# Patient Record
Sex: Female | Born: 1970 | Race: Black or African American | Hispanic: No | Marital: Single | Smoking: Never smoker
Health system: Southern US, Community
[De-identification: ages and names within clinical notes are randomized; demographics above are authoritative.]

## PROBLEM LIST (undated history)

## (undated) DIAGNOSIS — G4730 Sleep apnea, unspecified: Secondary | ICD-10-CM

## (undated) DIAGNOSIS — E7800 Pure hypercholesterolemia, unspecified: Secondary | ICD-10-CM

## (undated) DIAGNOSIS — J189 Pneumonia, unspecified organism: Secondary | ICD-10-CM

## (undated) DIAGNOSIS — J45909 Unspecified asthma, uncomplicated: Secondary | ICD-10-CM

## (undated) DIAGNOSIS — M545 Low back pain, unspecified: Secondary | ICD-10-CM

## (undated) DIAGNOSIS — T7840XA Allergy, unspecified, initial encounter: Secondary | ICD-10-CM

## (undated) DIAGNOSIS — K829 Disease of gallbladder, unspecified: Secondary | ICD-10-CM

## (undated) DIAGNOSIS — R0602 Shortness of breath: Secondary | ICD-10-CM

## (undated) DIAGNOSIS — M25562 Pain in left knee: Secondary | ICD-10-CM

## (undated) DIAGNOSIS — I10 Essential (primary) hypertension: Secondary | ICD-10-CM

## (undated) DIAGNOSIS — M549 Dorsalgia, unspecified: Secondary | ICD-10-CM

## (undated) DIAGNOSIS — F439 Reaction to severe stress, unspecified: Secondary | ICD-10-CM

## (undated) DIAGNOSIS — F419 Anxiety disorder, unspecified: Secondary | ICD-10-CM

## (undated) DIAGNOSIS — D649 Anemia, unspecified: Secondary | ICD-10-CM

## (undated) DIAGNOSIS — G709 Myoneural disorder, unspecified: Secondary | ICD-10-CM

## (undated) DIAGNOSIS — E663 Overweight: Secondary | ICD-10-CM

## (undated) DIAGNOSIS — E119 Type 2 diabetes mellitus without complications: Secondary | ICD-10-CM

## (undated) DIAGNOSIS — M1990 Unspecified osteoarthritis, unspecified site: Secondary | ICD-10-CM

## (undated) DIAGNOSIS — Z91018 Allergy to other foods: Secondary | ICD-10-CM

## (undated) DIAGNOSIS — G8929 Other chronic pain: Secondary | ICD-10-CM

## (undated) DIAGNOSIS — R51 Headache: Secondary | ICD-10-CM

## (undated) DIAGNOSIS — G43909 Migraine, unspecified, not intractable, without status migrainosus: Secondary | ICD-10-CM

## (undated) DIAGNOSIS — N289 Disorder of kidney and ureter, unspecified: Secondary | ICD-10-CM

## (undated) HISTORY — DX: Pain in left knee: M25.562

## (undated) HISTORY — DX: Type 2 diabetes mellitus without complications: E11.9

## (undated) HISTORY — DX: Overweight: E66.3

## (undated) HISTORY — DX: Pure hypercholesterolemia, unspecified: E78.00

## (undated) HISTORY — DX: Allergy, unspecified, initial encounter: T78.40XA

## (undated) HISTORY — DX: Shortness of breath: R06.02

## (undated) HISTORY — PX: WISDOM TOOTH EXTRACTION: SHX21

## (undated) HISTORY — DX: Disease of gallbladder, unspecified: K82.9

## (undated) HISTORY — DX: Myoneural disorder, unspecified: G70.9

## (undated) HISTORY — DX: Dorsalgia, unspecified: M54.9

## (undated) HISTORY — DX: Unspecified asthma, uncomplicated: J45.909

## (undated) HISTORY — DX: Allergy to other foods: Z91.018

---
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
## 2004-09-25 DIAGNOSIS — J189 Pneumonia, unspecified organism: Secondary | ICD-10-CM

## 2004-09-25 HISTORY — DX: Pneumonia, unspecified organism: J18.9

## 2005-08-30 ENCOUNTER — Emergency Department (HOSPITAL_COMMUNITY): Admission: EM | Admit: 2005-08-30 | Discharge: 2005-08-30 | Payer: Self-pay | Admitting: Family Medicine

## 2005-12-11 ENCOUNTER — Ambulatory Visit: Payer: Self-pay | Admitting: Internal Medicine

## 2005-12-11 ENCOUNTER — Inpatient Hospital Stay (HOSPITAL_COMMUNITY): Admission: EM | Admit: 2005-12-11 | Discharge: 2005-12-15 | Payer: Self-pay | Admitting: Emergency Medicine

## 2005-12-22 ENCOUNTER — Ambulatory Visit: Payer: Self-pay | Admitting: Internal Medicine

## 2006-01-04 ENCOUNTER — Ambulatory Visit: Payer: Self-pay | Admitting: Internal Medicine

## 2006-01-09 ENCOUNTER — Ambulatory Visit: Payer: Self-pay | Admitting: Internal Medicine

## 2006-03-06 ENCOUNTER — Ambulatory Visit: Payer: Self-pay | Admitting: Internal Medicine

## 2006-03-06 ENCOUNTER — Encounter (INDEPENDENT_AMBULATORY_CARE_PROVIDER_SITE_OTHER): Payer: Self-pay | Admitting: *Deleted

## 2006-03-06 ENCOUNTER — Ambulatory Visit (HOSPITAL_COMMUNITY): Admission: RE | Admit: 2006-03-06 | Discharge: 2006-03-06 | Payer: Self-pay | Admitting: Internal Medicine

## 2006-05-04 ENCOUNTER — Ambulatory Visit: Payer: Self-pay | Admitting: Internal Medicine

## 2006-08-09 DIAGNOSIS — D509 Iron deficiency anemia, unspecified: Secondary | ICD-10-CM | POA: Insufficient documentation

## 2007-06-07 ENCOUNTER — Ambulatory Visit: Payer: Self-pay | Admitting: Internal Medicine

## 2007-06-07 ENCOUNTER — Encounter (INDEPENDENT_AMBULATORY_CARE_PROVIDER_SITE_OTHER): Payer: Self-pay | Admitting: *Deleted

## 2007-06-07 DIAGNOSIS — R110 Nausea: Secondary | ICD-10-CM

## 2007-06-07 LAB — CONVERTED CEMR LAB: Beta hcg, urine, semiquantitative: NEGATIVE

## 2007-06-10 LAB — CONVERTED CEMR LAB
Platelets: 470 10*3/uL — ABNORMAL HIGH (ref 150–400)
RDW: 16.1 % — ABNORMAL HIGH (ref 11.5–14.0)
WBC: 7.8 10*3/uL (ref 4.0–10.5)

## 2007-12-17 ENCOUNTER — Ambulatory Visit: Payer: Self-pay | Admitting: Family Medicine

## 2008-01-19 ENCOUNTER — Inpatient Hospital Stay (HOSPITAL_COMMUNITY): Admission: AD | Admit: 2008-01-19 | Discharge: 2008-01-20 | Payer: Self-pay | Admitting: Obstetrics and Gynecology

## 2008-03-12 ENCOUNTER — Ambulatory Visit (HOSPITAL_COMMUNITY): Admission: RE | Admit: 2008-03-12 | Discharge: 2008-03-12 | Payer: Self-pay | Admitting: Family Medicine

## 2008-04-01 ENCOUNTER — Ambulatory Visit (HOSPITAL_COMMUNITY): Admission: RE | Admit: 2008-04-01 | Discharge: 2008-04-01 | Payer: Self-pay | Admitting: Family Medicine

## 2008-04-16 ENCOUNTER — Ambulatory Visit (HOSPITAL_COMMUNITY): Admission: RE | Admit: 2008-04-16 | Discharge: 2008-04-16 | Payer: Self-pay | Admitting: Family Medicine

## 2008-05-28 ENCOUNTER — Ambulatory Visit (HOSPITAL_COMMUNITY): Admission: RE | Admit: 2008-05-28 | Discharge: 2008-05-28 | Payer: Self-pay | Admitting: Family Medicine

## 2008-06-21 ENCOUNTER — Inpatient Hospital Stay (HOSPITAL_COMMUNITY): Admission: AD | Admit: 2008-06-21 | Discharge: 2008-06-21 | Payer: Self-pay | Admitting: Obstetrics & Gynecology

## 2008-06-21 ENCOUNTER — Ambulatory Visit: Payer: Self-pay | Admitting: Family Medicine

## 2008-06-25 ENCOUNTER — Ambulatory Visit (HOSPITAL_COMMUNITY): Admission: RE | Admit: 2008-06-25 | Discharge: 2008-06-25 | Payer: Self-pay | Admitting: Family Medicine

## 2008-08-07 ENCOUNTER — Ambulatory Visit (HOSPITAL_COMMUNITY): Admission: RE | Admit: 2008-08-07 | Discharge: 2008-08-07 | Payer: Self-pay | Admitting: Family Medicine

## 2008-08-16 ENCOUNTER — Inpatient Hospital Stay (HOSPITAL_COMMUNITY): Admission: AD | Admit: 2008-08-16 | Discharge: 2008-08-16 | Payer: Self-pay | Admitting: Obstetrics & Gynecology

## 2008-08-26 ENCOUNTER — Ambulatory Visit: Payer: Self-pay | Admitting: Family

## 2008-08-26 ENCOUNTER — Inpatient Hospital Stay (HOSPITAL_COMMUNITY): Admission: AD | Admit: 2008-08-26 | Discharge: 2008-08-26 | Payer: Self-pay | Admitting: Obstetrics and Gynecology

## 2008-09-11 ENCOUNTER — Inpatient Hospital Stay (HOSPITAL_COMMUNITY): Admission: AD | Admit: 2008-09-11 | Discharge: 2008-09-11 | Payer: Self-pay | Admitting: Family Medicine

## 2008-09-12 ENCOUNTER — Inpatient Hospital Stay (HOSPITAL_COMMUNITY): Admission: AD | Admit: 2008-09-12 | Discharge: 2008-09-12 | Payer: Self-pay | Admitting: Obstetrics & Gynecology

## 2008-09-15 ENCOUNTER — Inpatient Hospital Stay (HOSPITAL_COMMUNITY): Admission: AD | Admit: 2008-09-15 | Discharge: 2008-09-15 | Payer: Self-pay | Admitting: Obstetrics & Gynecology

## 2008-09-15 ENCOUNTER — Ambulatory Visit: Payer: Self-pay | Admitting: Advanced Practice Midwife

## 2008-09-16 ENCOUNTER — Ambulatory Visit: Payer: Self-pay | Admitting: Obstetrics & Gynecology

## 2008-09-16 ENCOUNTER — Inpatient Hospital Stay (HOSPITAL_COMMUNITY): Admission: AD | Admit: 2008-09-16 | Discharge: 2008-09-21 | Payer: Self-pay | Admitting: Family Medicine

## 2008-09-18 ENCOUNTER — Encounter: Payer: Self-pay | Admitting: Family Medicine

## 2009-03-31 ENCOUNTER — Emergency Department (HOSPITAL_COMMUNITY): Admission: EM | Admit: 2009-03-31 | Discharge: 2009-03-31 | Payer: Self-pay | Admitting: Emergency Medicine

## 2009-06-28 ENCOUNTER — Ambulatory Visit: Payer: Self-pay | Admitting: Physician Assistant

## 2009-06-28 ENCOUNTER — Inpatient Hospital Stay (HOSPITAL_COMMUNITY): Admission: AD | Admit: 2009-06-28 | Discharge: 2009-06-28 | Payer: Self-pay | Admitting: Family Medicine

## 2009-07-01 ENCOUNTER — Encounter: Admission: RE | Admit: 2009-07-01 | Discharge: 2009-07-01 | Payer: Self-pay | Admitting: Family Medicine

## 2009-10-13 ENCOUNTER — Emergency Department (HOSPITAL_COMMUNITY): Admission: EM | Admit: 2009-10-13 | Discharge: 2009-10-13 | Payer: Self-pay | Admitting: Family Medicine

## 2010-10-16 ENCOUNTER — Encounter: Payer: Self-pay | Admitting: Family Medicine

## 2010-11-11 ENCOUNTER — Inpatient Hospital Stay (INDEPENDENT_AMBULATORY_CARE_PROVIDER_SITE_OTHER)
Admission: RE | Admit: 2010-11-11 | Discharge: 2010-11-11 | Disposition: A | Discharge status: Home / Self Care | Payer: Medicaid Other | Source: Ambulatory Visit | Attending: Family Medicine | Admitting: Family Medicine

## 2010-11-11 DIAGNOSIS — R198 Other specified symptoms and signs involving the digestive system and abdomen: Secondary | ICD-10-CM

## 2010-11-11 LAB — POCT URINALYSIS DIPSTICK
Bilirubin Urine: NEGATIVE
Ketones, ur: NEGATIVE mg/dL
Specific Gravity, Urine: 1.025 (ref 1.005–1.030)
Urine Glucose, Fasting: NEGATIVE mg/dL

## 2010-11-11 LAB — POCT PREGNANCY, URINE: Preg Test, Ur: NEGATIVE

## 2010-12-22 ENCOUNTER — Other Ambulatory Visit: Payer: Self-pay | Admitting: Gastroenterology

## 2010-12-22 DIAGNOSIS — R110 Nausea: Secondary | ICD-10-CM

## 2010-12-26 ENCOUNTER — Other Ambulatory Visit: Payer: Medicaid Other

## 2010-12-30 ENCOUNTER — Ambulatory Visit
Admission: RE | Admit: 2010-12-30 | Discharge: 2010-12-30 | Disposition: A | Discharge status: Home / Self Care | Payer: Medicaid Other | Source: Ambulatory Visit | Attending: Gastroenterology | Admitting: Gastroenterology

## 2010-12-30 DIAGNOSIS — R110 Nausea: Secondary | ICD-10-CM

## 2011-01-01 LAB — BASIC METABOLIC PANEL
BUN: 7 mg/dL (ref 6–23)
Chloride: 112 mEq/L (ref 96–112)
Creatinine, Ser: 0.79 mg/dL (ref 0.4–1.2)
GFR calc Af Amer: >60 mL/min (ref >60)
GFR calc non Af Amer: >60 mL/min (ref >60)
Sodium: 143 mEq/L (ref 135–145)

## 2011-01-01 LAB — DIFFERENTIAL
Lymphocytes Relative: 35 % (ref 12–46)
Lymphs Abs: 2.8 10*3/uL (ref 0.7–4.0)
Neutrophils Relative %: 54 % (ref 43–77)

## 2011-01-01 LAB — URINALYSIS, ROUTINE W REFLEX MICROSCOPIC
Bilirubin Urine: NEGATIVE
Ketones, ur: NEGATIVE mg/dL
Nitrite: NEGATIVE
Urobilinogen, UA: 0.2 mg/dL (ref 0.0–1.0)

## 2011-01-01 LAB — POCT CARDIAC MARKERS
CKMB, poc: <1 ng/mL — ABNORMAL LOW (ref 1.0–8.0)
Myoglobin, poc: 57.9 ng/mL (ref 12–200)
Troponin i, poc: <0.05 ng/mL (ref 0.00–0.09)

## 2011-01-01 LAB — CBC
HCT: 34.8 % — ABNORMAL LOW (ref 36.0–46.0)
Platelets: 459 10*3/uL — ABNORMAL HIGH (ref 150–400)
WBC: 8 10*3/uL (ref 4.0–10.5)

## 2011-01-01 LAB — POCT PREGNANCY, URINE: Preg Test, Ur: NEGATIVE

## 2011-02-07 NOTE — Op Note (Signed)
NAMEDONNETTA, GILLIN              ACCOUNT NO.:  [id number]   MEDICAL RECORD NO.:  [id number]         PATIENT TYPE:  WINP   LOCATION:  101                           FACILITY:  [facility]   PHYSICIAN:  Lazaro Arms, M.D.   DATE OF BIRTH:  12-16-1970   DATE OF PROCEDURE:  09/18/2008  DATE OF DISCHARGE:                               OPERATIVE REPORT   PREOPERATIVE DIAGNOSES:  1. Intrauterine pregnancy at 40-6/7 weeks' gestation.  2. Induction of labor secondary to variable decelerations with low      amniotic fluid index.  3. Significant arrest of dilatation soon.   POSTOPERATIVE DIAGNOSES:  1. Intrauterine pregnancy at 40-6/7 weeks' gestation.  2. Induction of labor secondary to variable decelerations with low      amniotic fluid index.  3. Significant arrest of dilatation soon.   PROCEDURE:  Primary low transverse cesarean section.   SURGEON:  Lazaro Arms, MD   ANESTHESIA:  Epidural.   FINDINGS:  The patient basically at 6-7 cm and did not get any further  despite adequate labor.  Fetal heart rate tracing was reassuring.  She  had had variable decelerations earlier, but baby looked great.  Overall,  no concerns as far as fetal heart rate status goes.  She had been 6 cm  for about 6 hours and had a gap that did not really was not coming to  pelvis well at all.  As a result, we proceeded with primary low  transverse cesarean section of a viable female, Apgars 9 and 9.  Weight  determined at the nursery, 3-vessel cord, pH 7.30.  Uterus, tubes and  ovaries were normal.   DESCRIPTION OF OPERATION:  The patient was taken to the operating room,  placed in the supine position with a left uterine tilt.  Epidural was  dosed to allow for adequate anesthesia.  She was prepped and draped in  the usual sterile fashion.  A Pfannenstiel skin incision was made and  carried down sharply to the rectus fascia, scored in the midline, and  extended laterally. The fascia was taken off the muscle  superiorly and  inferiorly without difficulty.  The muscles were divided and the  peritoneal cavity was entered.  A bladder blade was placed.  The  vesicouterine serosal flap was created.  Low transverse hysterotomy  incision was made.  Over this incision was delivered a viable female  infant with Apgars of 9 and 9, weight to be determined at the nursery.  Two-vessel cord, cord blood, and cord gas sent. Placenta was normal and  sent to pathology, pH of 7.30.  Uterus was closed in two layers, first  being running and locking layers and the second being imbricating layer.  There was good hemostasis.  Pelvis was irrigated vigorously.  Muscles  and peritoneum reapproximated loosely and fascia closed using 0 Vicryl  running.  Subcutaneous tissues were made hemostatic and irrigated.  The skin was  closed using skin staples.  The patient tolerated the procedure well.  She experienced 750 mL of blood loss and taken to the recovery room in  good and  stable condition.  All counts were correct x3.      Lazaro Arms, M.D.  Electronically Signed     LHE/MEDQ  D:  09/18/2008  T:  09/18/2008  Job:  [id number]

## 2011-02-07 NOTE — Discharge Summary (Signed)
[name] [name], [name] [name]              ACCOUNT NO.:  [id number]   MEDICAL RECORD NO.:  [id number]          PATIENT TYPE:  INP   LOCATION:  9101                          FACILITY:  [facility]   PHYSICIAN:  Lazaro Arms, M.D.   DATE OF BIRTH:  1971-03-08   DATE OF ADMISSION:  09/16/2008  DATE OF DISCHARGE:  09/21/2008                               DISCHARGE SUMMARY   REASON FOR ADMISSION:  Pregnancy at 40 weeks and 3 days who [facility] and noted to have decelerations on auscultation.  The  patient was then sent over to [facility] and it felt like prudent that the  patient be induced at that time.   DISCHARGE DIAGNOSES:  Pregnancy at term, failed induction of labor,  arrest of dilatation at 7 cm with a low transverse cesarean section by  Dr. Despina Hidden.   HOSPITAL COURSE:  Uneventful.  The patient was up ambulating well,  taking p.o. fluids and solids well, passing gas rectally, hemoglobin is  7.4, which the patient is tolerating that well.  She started out at 9.  She is asymptomatic when up and ambulating.  As far as contraception  goes, she is undecided.  She will discuss that at her 6 weeks' visit.   MEDICATIONS:  1. Motrin 600 one p.o. q.6. h. p.r.n., cramping.  2. Percocet 5/325 one p.o. q.4 h. p.r.n. pain.  3. HCTZ 25 mg p.o. daily if swelling.  4. Chromagen Forte b.i.d.   On assessment this morning, heart is regular rhythm and rate.  Lungs are  clear to auscultation bilaterally.  Abdomen is soft.  Bowel sounds are  present x4.  Incision is intact.  There is no redness, swelling, or  drainage.  Fundus is firm.  Lochia is scant amount.  She does have 1+  pitting edema in lower extremities.   ASSESSMENT:  Stable postop day 3, with anemia.  I am going to discharge  her home with the [facility] nurse to remove her staples on postop day 5  through 7.       Zerita Boers, N.M.      Lazaro Arms, M.D.  Electronically Signed   DL/MEDQ  D:  52/84/1324  T:  09/21/2008   Job:  [id number]   cc:   Lazaro Arms, M.D.  Fax: [phone]

## 2011-02-10 NOTE — Discharge Summary (Signed)
[name] [name], [name] [name]              ACCOUNT NO.:  [id number]   MEDICAL RECORD NO.:  [id number]          PATIENT TYPE:  INP   LOCATION:  2019                         FACILITY:  [facility]   PHYSICIAN:  Ileana Roup, M.D.  DATE OF BIRTH:  01/01/1971   DATE OF ADMISSION:  12/11/2005  DATE OF DISCHARGE:  12/15/2005                                 DISCHARGE SUMMARY   DISCHARGE DIAGNOSES:  1.  Sepsis second to community-acquired pneumonia.  2.  Respiratory alkalosis.  3.  Elevated liver enzymes.  4.  Anemia, most likely iron-deficiency secondary to menstruation.  5.  Rhabdomyolysis, mild.   DISCHARGE MEDICATIONS:  1.  Avelox 400 mg p.o. daily, till December 25, 2005.  2.  Multivitamin one pill daily.   FOLLOWUP:  The patient is to follow up with Dr. Liliane Channel on December 22, 2005 at  1:30 p.m. at this time to check for any resolution of her symptoms and also  to check a CBC count along with basic metabolic panel.  The CBC is to  determine if her hemoglobin is stable post hospitalization and basic  metabolic panel to see if her acute renal failure was resolving.  The  patient apparently needs a primary care physician and to make arrangements  for the same when she comes for followup visit.   PROCEDURE:  Done during this admission, none.   IMAGES:  Done during this admission:  1.  Chest x-ray done, on December 11, 2005, shows left upper lobe pneumonia.  2.  Ultrasound abdomen done, on December 11, 2005, shows normal complete      abdominal ultrasound study.  The mid abdominal aorta is not well seen      due to overlying gas.  3.  CT pelvis done with contrast was negative.  4.  Abdominal CT with contrast was negative.  5.  CT angio of chest shows no evidence of pulmonary embolism.  Near      complete left upper lobe consolidation and central right lower lobe      infiltrate consistent with pneumonia.  No evidence of mass or pleural      effusion.  6.  Chest x-ray done, on December 14, 2005, shows stable  confluent left upper      lobe infiltrate.  No significant interval change.   CONSULTANTS:  This admission, none.   BRIEF HISTORY AND PHYSICAL:  Please see medical records for details.  Ms.  [name] is a 40 year old, African American, morbidly obese woman with no  significant past medical history presenting to the ED with a four-day  history of cough with sputum production. The sputum was greenish-yellow,  moderate in quantity, not blood-tinged, associated with fevers (more than  103 degrees Fahrenheit), chills, and rigors.  The patient was apparently  normal prior to these four days.  The patient tried over-the-counter  medications like NyQuil and Tylenol but had no relief.  Associated with  chest pain center and right sided pleuritic in nature, increased __________  coughing on deep inspiration, no radiation, also associated with some amount  of right upper quadrant pain, shortness of breath.  No association with food  habits, no aggravating or alleviating factors.  The patient has a history of  sick contact, her mother and also says that since she works at [employer] she  could have been exposed to someone with infection as well.   ALLERGIES:  No known drug allergies.   PAST MEDICAL HISTORY:  Morbidly obese.   MEDICATIONS:  None.   SUBSTANCE HISTORY:  The patient has never smoked in her life,  does not  drink alcohol, and, has never used IV drugs or any other recreational drugs.   SOCIAL HISTORY:  She is single and her education status is to the 11th  grade.  She has insurance as provided by her employers, and she lives with  her Mom and Dad in [place].   FAMILY HISTORY:  Her mother is alive, has hypertension and osteoarthritis.  Father is alive and healthy.  She has four siblings who are healthy and no  children.   REVIEW OF SYSTEMS:  She had fever, chills, positive sick contact, night  sweats, fatigue, dyspnea, dyspnea on exertion, cough, sputum, nausea.  Her  last  menstrual period was November 20, 2005.   PHYSICAL EXAMINATION:  VITAL SIGNS:  Temperature 103.8, after giving Tylenol  it became 101.2, blood pressure 100/82 which later became 94/60, pulse of 75  which later became 125, respiratory rate of 28, O2 sat 95% on room air.  GENERAL APPEARANCE:  The patient appeared in mild respiratory distress.  She  could not speak in full sentences.  EYES:  Pupils equal, round, and reactive to light.  Extraocular movements  intact.  Oropharynx clear.  Pale conjunctivae.  NECK:  Obese, supple.  No adenopathy.  No thyromegaly.  LUNGS:  Tachypneic.  Air entry decreased in bilateral upper lobes.  Rhonchi  present in upper lobes bilaterally.  HEART:  Tachycardic.  Regular rhythm.  No murmurs, rubs, or gallops.  ABDOMEN:  Soft, obese.  Tenderness present in the periumbilical region and  right upper quadrant.  No rebound, guarding, or rigidity.  Bowel sounds  present.  EXTREMITIES:  Edema negative.  NEUROLOGIC:  Alert and oriented x3.  Grossly nonfocal.  PSYCHIATRIC:  Appropriate.   ADMISSION LABORATORY:  White cell count of 22.8, ANC of 19.6, hemoglobin of  10.6, hematocrit of 32.2, and platelets of 293, MCV of 73.  ABG:  PH of  7.58, carbon dioxide 21, O2 of 93, and bicarb of 20 on 99% saturation.  Sodium 126, potassium 2.7, chloride 91, bicarb 21, glucose 119, BUN 13,  creatinine 1.5.  Total bili 2.3, alk phos 108, AST 142, ALT 48, total  protein 7.5, albumin 2.4, and a calcium of 7.9.   HOSPITAL COURSE:  1.  Sepsis secondary to community-acquired pneumonia.  When the patient was      admitted, she had a temperature of 103.8 and a blood pressure of 100/82      and appeared to be in mild respiratory distress with good O2 sat of 94%      on room air.  However, after giving Tylenol, her temperature came down      to 101.2 but later as the time progressed she started becoming     hypotensive and tachycardic and she still had a fever.  At that time,      blood  cultures were drawn, sputum cultures were sent for Gram stain,      culture and sensitivity, urine cultures were also sent, and the patient  was placed on broad spectrum antibiotics given that we thought she was      getting septic secondary to the only cause that we had at that point in      time was the pneumonia in her lungs.  Given that the patient also had      some abdominal tenderness, we were concerned that she might have an      infection in her abdomen which could be also causing her to be septic;      therefore, a CT abdomen and pelvis was done to rule out any other      sources of infection.  They were found to be negative and therefore, her      final source of infection, we thought, was the pneumonia.  The patient's      blood pressure came up very well with just fluids and we did not have to      use any vasopressors.  Her temperature overnight defervesced and she did      not spike any temperatures after the first 24 hours of admission.  The      patient did have some amount of cough and sputum cultures were sent in      which all cultures to date have been negative.  Urine for Legionella      antigen and Strep Pneumococcus antigen was also sent which were      negative.  The patient had developed respiratory alkalosis secondary to      tachypnea.  After fluid administration and with some amount of      bronchodilator treatment and broad spectrum antibiotics of vancomycin      and Zosyn along with azithromycin, the patient's __________  improved.      The patient showed symptomatic improvement over the days of her      hospitalization and on the day of discharge, she was saturating very      well at 100% on room air., was able to ambulate by herself, did have      some residual cough but not as much as she had when she came in.  Repeat      chest x-ray was done which showed stable infiltrate, this needs followup      on an outpatient basis.  Repeat the chest x-ray in four  to six weeks' of      time to check for resolution of her pneumonia.  The patient was sent      home on Avelox 400 mg p.o. daily since she had completed four days of      antibiotics here in the hospital, she needs to take it for another 10      days to complete a total of 14-day course of treatment.  It was also      concerning that the patient was 34-years-of-age and had developed such a      bad pneumonia and was septic secondary to it.  Therefore, an HIV status      was checked which was nonreactive along with a hepatitis panel which was      also negative for hepatitis A, B, and C.  2.  Respiratory alkalosis secondary to problem #1.  This resolved with fluid      administration and therefore the patient was no longer tachypneic 24      hours after admission.  3.  Elevated liver enzymes.  This is most likely secondary to the sepsis.  Daily comprehensive metabolic panel was checked and it was seen that the      patient had a downward trend of her liver function tests, after she got      over the sepsis.  Of note, the patient's albumin is very low and on the      day of discharge was as low as 1.9.  We do not know why it is so low and      can only speculate at this point in time, to keep an eye on that in the      future.  4.  Rhabdomyolysis, mild.  The patient had elevated CK levels on admission      and she was treated with IV fluids and her creatinine kinase levels down      trended.  On the day of discharge, her cardiac CK was 5813.  5.  Anemia most likely secondary to menstrual losses.  The patient was      currently on her period when she was here in the hospital and her      hemoglobin stayed in the 8 to 9 grams per dL range.  There was an      initial drop after the patient came into the hospital and this was      thought to be secondary to hemodilution, given that the patient was      given a lot of IV fluids in order to maintain her blood pressure in the      first 24  hours of admission.  Iron studies were checked and it was seen      that serum iron was normal.  TIBC was elevated.  RBC folate was      elevated.  Haptoglobin elevated.  Vitamin B-12 within normal limits.   DISCHARGE LABORATORY:  Sodium of 141, potassium 3.4, chloride 110, bicarb  25, glucose 105, BUN 2, creatinine 0.7, calcium 7.8.  White cell count 10.7,  hemoglobin 8.6, hematocrit 26.4, MCV 73.5, platelet count 454.  CK level of  2,110.  This is on a downward trend.   Of note, the patient was given one dose of potassium chloride of 40 mEq STAT  before discharge.     Yetta Barre, M.D.    ______________________________  Ileana Roup, M.D.   SS/MEDQ  D:  12/15/2005  T:  12/15/2005  Job:  [id number]

## 2011-05-09 ENCOUNTER — Emergency Department (HOSPITAL_COMMUNITY): Payer: No Typology Code available for payment source

## 2011-05-09 ENCOUNTER — Emergency Department (HOSPITAL_COMMUNITY)
Admission: EM | Admit: 2011-05-09 | Discharge: 2011-05-09 | Disposition: A | Discharge status: Home / Self Care | Payer: No Typology Code available for payment source | Attending: Emergency Medicine | Admitting: Emergency Medicine

## 2011-05-09 DIAGNOSIS — R079 Chest pain, unspecified: Secondary | ICD-10-CM | POA: Insufficient documentation

## 2011-05-09 DIAGNOSIS — R51 Headache: Secondary | ICD-10-CM | POA: Insufficient documentation

## 2011-05-09 DIAGNOSIS — Z79899 Other long term (current) drug therapy: Secondary | ICD-10-CM | POA: Insufficient documentation

## 2011-05-09 DIAGNOSIS — S139XXA Sprain of joints and ligaments of unspecified parts of neck, initial encounter: Secondary | ICD-10-CM | POA: Insufficient documentation

## 2011-05-09 DIAGNOSIS — M542 Cervicalgia: Secondary | ICD-10-CM | POA: Insufficient documentation

## 2011-05-09 DIAGNOSIS — S060X0A Concussion without loss of consciousness, initial encounter: Secondary | ICD-10-CM | POA: Insufficient documentation

## 2011-05-11 ENCOUNTER — Emergency Department (HOSPITAL_COMMUNITY)
Admission: EM | Admit: 2011-05-11 | Discharge: 2011-05-11 | Disposition: A | Discharge status: Home / Self Care | Payer: No Typology Code available for payment source | Attending: Emergency Medicine | Admitting: Emergency Medicine

## 2011-05-11 DIAGNOSIS — S139XXA Sprain of joints and ligaments of unspecified parts of neck, initial encounter: Secondary | ICD-10-CM | POA: Insufficient documentation

## 2011-05-11 DIAGNOSIS — M546 Pain in thoracic spine: Secondary | ICD-10-CM | POA: Insufficient documentation

## 2011-05-11 DIAGNOSIS — Z79899 Other long term (current) drug therapy: Secondary | ICD-10-CM | POA: Insufficient documentation

## 2011-05-11 DIAGNOSIS — M25519 Pain in unspecified shoulder: Secondary | ICD-10-CM | POA: Insufficient documentation

## 2011-05-11 DIAGNOSIS — M542 Cervicalgia: Secondary | ICD-10-CM | POA: Insufficient documentation

## 2011-05-11 DIAGNOSIS — M545 Low back pain, unspecified: Secondary | ICD-10-CM | POA: Insufficient documentation

## 2011-06-20 LAB — URINALYSIS, ROUTINE W REFLEX MICROSCOPIC
Nitrite: NEGATIVE
Protein, ur: NEGATIVE
Specific Gravity, Urine: >1.03 — ABNORMAL HIGH
Urobilinogen, UA: 1

## 2011-06-20 LAB — CBC
HCT: 34.8 — ABNORMAL LOW
MCHC: 33.6
MCV: 77.7 — ABNORMAL LOW
RBC: 4.47

## 2011-06-20 LAB — URINE MICROSCOPIC-ADD ON

## 2011-06-20 LAB — WET PREP, GENITAL: Yeast Wet Prep HPF POC: NONE SEEN

## 2011-06-20 LAB — POCT PREGNANCY, URINE
Operator id: 120871
Preg Test, Ur: POSITIVE

## 2011-06-20 LAB — HCG, QUANTITATIVE, PREGNANCY: hCG, Beta Chain, Quant, S: 37320 — ABNORMAL HIGH

## 2011-06-27 LAB — URINALYSIS, ROUTINE W REFLEX MICROSCOPIC
Bilirubin Urine: NEGATIVE
Ketones, ur: NEGATIVE
Nitrite: NEGATIVE
Protein, ur: NEGATIVE
Urobilinogen, UA: 0.2
pH: 6.5

## 2011-06-30 LAB — CBC
HCT: 22.3 % — ABNORMAL LOW (ref 36.0–46.0)
HCT: 22.9 % — ABNORMAL LOW (ref 36.0–46.0)
HCT: 31.5 % — ABNORMAL LOW (ref 36.0–46.0)
Hemoglobin: 10.1 g/dL — ABNORMAL LOW (ref 12.0–15.0)
MCHC: 31.9 g/dL (ref 30.0–36.0)
MCHC: 32.1 g/dL (ref 30.0–36.0)
MCV: 74.3 fL — ABNORMAL LOW (ref 78.0–100.0)
MCV: 75.3 fL — ABNORMAL LOW (ref 78.0–100.0)
Platelets: 282 10*3/uL (ref 150–400)
Platelets: 353 10*3/uL (ref 150–400)
RBC: 4.24 MIL/uL (ref 3.87–5.11)
RDW: 20.8 % — ABNORMAL HIGH (ref 11.5–15.5)
WBC: 13.3 10*3/uL — ABNORMAL HIGH (ref 4.0–10.5)
WBC: 8.5 10*3/uL (ref 4.0–10.5)

## 2011-07-15 ENCOUNTER — Emergency Department (HOSPITAL_COMMUNITY)
Admission: EM | Admit: 2011-07-15 | Discharge: 2011-07-15 | Disposition: A | Discharge status: Home / Self Care | Payer: No Typology Code available for payment source | Attending: Emergency Medicine | Admitting: Emergency Medicine

## 2011-07-15 DIAGNOSIS — M533 Sacrococcygeal disorders, not elsewhere classified: Secondary | ICD-10-CM | POA: Insufficient documentation

## 2011-07-15 DIAGNOSIS — M545 Low back pain, unspecified: Secondary | ICD-10-CM | POA: Insufficient documentation

## 2011-07-15 DIAGNOSIS — M25559 Pain in unspecified hip: Secondary | ICD-10-CM | POA: Insufficient documentation

## 2011-07-15 DIAGNOSIS — M5430 Sciatica, unspecified side: Secondary | ICD-10-CM | POA: Insufficient documentation

## 2011-07-15 DIAGNOSIS — M79609 Pain in unspecified limb: Secondary | ICD-10-CM | POA: Insufficient documentation

## 2011-07-15 DIAGNOSIS — F411 Generalized anxiety disorder: Secondary | ICD-10-CM | POA: Insufficient documentation

## 2011-07-15 DIAGNOSIS — E669 Obesity, unspecified: Secondary | ICD-10-CM | POA: Insufficient documentation

## 2012-02-29 ENCOUNTER — Encounter (HOSPITAL_COMMUNITY): Payer: Self-pay | Admitting: Emergency Medicine

## 2012-02-29 ENCOUNTER — Emergency Department (HOSPITAL_COMMUNITY): Payer: Medicaid Other

## 2012-02-29 ENCOUNTER — Inpatient Hospital Stay (HOSPITAL_COMMUNITY)
Admission: EM | Admit: 2012-02-29 | Discharge: 2012-03-03 | DRG: 418 | Disposition: A | Discharge status: Home / Self Care | Payer: Medicaid Other | Source: Ambulatory Visit | Attending: General Surgery | Admitting: General Surgery

## 2012-02-29 DIAGNOSIS — K8020 Calculus of gallbladder without cholecystitis without obstruction: Secondary | ICD-10-CM

## 2012-02-29 DIAGNOSIS — D649 Anemia, unspecified: Secondary | ICD-10-CM | POA: Insufficient documentation

## 2012-02-29 DIAGNOSIS — R51 Headache: Secondary | ICD-10-CM

## 2012-02-29 DIAGNOSIS — K8010 Calculus of gallbladder with chronic cholecystitis without obstruction: Secondary | ICD-10-CM

## 2012-02-29 DIAGNOSIS — Z6841 Body Mass Index (BMI) 40.0 and over, adult: Secondary | ICD-10-CM

## 2012-02-29 DIAGNOSIS — R110 Nausea: Secondary | ICD-10-CM | POA: Diagnosis present

## 2012-02-29 DIAGNOSIS — K824 Cholesterolosis of gallbladder: Secondary | ICD-10-CM

## 2012-02-29 HISTORY — DX: Headache: R51

## 2012-02-29 HISTORY — DX: Anemia, unspecified: D64.9

## 2012-02-29 HISTORY — DX: Unspecified osteoarthritis, unspecified site: M19.90

## 2012-02-29 HISTORY — DX: Low back pain, unspecified: M54.50

## 2012-02-29 HISTORY — DX: Essential (primary) hypertension: I10

## 2012-02-29 HISTORY — DX: Migraine, unspecified, not intractable, without status migrainosus: G43.909

## 2012-02-29 HISTORY — DX: Low back pain: M54.5

## 2012-02-29 HISTORY — DX: Pneumonia, unspecified organism: J18.9

## 2012-02-29 HISTORY — DX: Reaction to severe stress, unspecified: F43.9

## 2012-02-29 HISTORY — DX: Other chronic pain: G89.29

## 2012-02-29 HISTORY — DX: Anxiety disorder, unspecified: F41.9

## 2012-02-29 LAB — CBC
Hemoglobin: 11.3 g/dL — ABNORMAL LOW (ref 12.0–15.0)
MCHC: 31.7 g/dL (ref 30.0–36.0)
Platelets: 427 10*3/uL — ABNORMAL HIGH (ref 150–400)
RBC: 4.62 MIL/uL (ref 3.87–5.11)

## 2012-02-29 LAB — URINALYSIS, ROUTINE W REFLEX MICROSCOPIC
Bilirubin Urine: NEGATIVE
Glucose, UA: NEGATIVE mg/dL
Hgb urine dipstick: NEGATIVE
Ketones, ur: NEGATIVE mg/dL
Protein, ur: NEGATIVE mg/dL
pH: 6 (ref 5.0–8.0)

## 2012-02-29 LAB — DIFFERENTIAL
Basophils Relative: 0 % (ref 0–1)
Lymphs Abs: 2 10*3/uL (ref 0.7–4.0)
Monocytes Relative: 5 % (ref 3–12)
Neutro Abs: 5.1 10*3/uL (ref 1.7–7.7)
Neutrophils Relative %: 63 % (ref 43–77)

## 2012-02-29 LAB — COMPREHENSIVE METABOLIC PANEL
ALT: 10 U/L (ref 0–35)
AST: 14 U/L (ref 0–37)
Albumin: 3.3 g/dL — ABNORMAL LOW (ref 3.5–5.2)
Alkaline Phosphatase: 79 U/L (ref 39–117)
BUN: 8 mg/dL (ref 6–23)
Chloride: 106 mEq/L (ref 96–112)
Potassium: 4.1 mEq/L (ref 3.5–5.1)
Sodium: 139 mEq/L (ref 135–145)
Total Bilirubin: 0.3 mg/dL (ref 0.3–1.2)

## 2012-02-29 MED ORDER — HYDROMORPHONE HCL PF 1 MG/ML IJ SOLN
1.0000 mg | Freq: Once | INTRAMUSCULAR | Status: AC
  Administered 2012-02-29: 1 mg via INTRAVENOUS
  Filled 2012-02-29: qty 1

## 2012-02-29 MED ORDER — ONDANSETRON HCL 4 MG/2ML IJ SOLN
4.0000 mg | Freq: Once | INTRAMUSCULAR | Status: AC
  Administered 2012-02-29: 4 mg via INTRAVENOUS
  Filled 2012-02-29: qty 2

## 2012-02-29 MED ORDER — CEFAZOLIN SODIUM-DEXTROSE 2-3 GM-% IV SOLR
2.0000 g | INTRAVENOUS | Status: DC
  Filled 2012-02-29: qty 50

## 2012-02-29 MED ORDER — MORPHINE SULFATE 2 MG/ML IJ SOLN
1.0000 mg | INTRAMUSCULAR | Status: DC | PRN
  Administered 2012-02-29 – 2012-03-02 (×4): 2 mg via INTRAVENOUS
  Filled 2012-02-29 (×4): qty 1

## 2012-02-29 MED ORDER — POTASSIUM CHLORIDE IN NACL 20-0.9 MEQ/L-% IV SOLN
INTRAVENOUS | Status: DC
  Administered 2012-02-29 – 2012-03-02 (×4): via INTRAVENOUS
  Filled 2012-02-29 (×9): qty 1000

## 2012-02-29 MED ORDER — SODIUM CHLORIDE 0.9 % IV SOLN
1000.0000 mL | INTRAVENOUS | Status: DC
  Administered 2012-02-29: 1000 mL via INTRAVENOUS

## 2012-02-29 MED ORDER — PANTOPRAZOLE SODIUM 40 MG IV SOLR
40.0000 mg | Freq: Every day | INTRAVENOUS | Status: DC
  Administered 2012-02-29 – 2012-03-02 (×3): 40 mg via INTRAVENOUS
  Filled 2012-02-29 (×4): qty 40

## 2012-02-29 MED ORDER — CEFAZOLIN SODIUM-DEXTROSE 2-3 GM-% IV SOLR
2.0000 g | Freq: Three times a day (TID) | INTRAVENOUS | Status: DC
  Administered 2012-02-29 – 2012-03-02 (×5): 2 g via INTRAVENOUS
  Filled 2012-02-29 (×7): qty 50

## 2012-02-29 MED ORDER — CHLORHEXIDINE GLUCONATE 4 % EX LIQD
1.0000 "application " | Freq: Once | CUTANEOUS | Status: AC
  Administered 2012-03-01: 1 via TOPICAL
  Filled 2012-02-29: qty 15

## 2012-02-29 MED ORDER — SODIUM CHLORIDE 0.9 % IV SOLN
1000.0000 mL | Freq: Once | INTRAVENOUS | Status: AC
Start: 2012-02-29 — End: 2012-02-29
  Administered 2012-02-29: 1000 mL via INTRAVENOUS

## 2012-02-29 MED ORDER — DIPHENHYDRAMINE HCL 12.5 MG/5ML PO ELIX
12.5000 mg | ORAL_SOLUTION | Freq: Four times a day (QID) | ORAL | Status: DC | PRN
  Filled 2012-02-29: qty 10

## 2012-02-29 MED ORDER — DIPHENHYDRAMINE HCL 50 MG/ML IJ SOLN
12.5000 mg | Freq: Four times a day (QID) | INTRAMUSCULAR | Status: DC | PRN
  Administered 2012-02-29: 25 mg via INTRAVENOUS
  Filled 2012-02-29: qty 1

## 2012-02-29 MED ORDER — ONDANSETRON HCL 4 MG/2ML IJ SOLN
4.0000 mg | Freq: Four times a day (QID) | INTRAMUSCULAR | Status: DC | PRN
  Administered 2012-02-29 – 2012-03-02 (×3): 4 mg via INTRAVENOUS
  Filled 2012-02-29 (×3): qty 2

## 2012-02-29 NOTE — ED Notes (Signed)
Pt c/o RUQ pain starting this am; pt sts nausea but denies vomiting; pt sts hx of same that was thought to be gall stones but pt did not follow up

## 2012-02-29 NOTE — ED Notes (Signed)
Attempted to call report to floor; floor unable to accept to call back 

## 2012-02-29 NOTE — H&P (Addendum)
[name] [name] Aug 23, 1971  [id number].   Requesting MD: Dr. Lynelle Doctor Chief Complaint/Reason for Consult: Abd pain, cholithiasis  HPI: Patient is a 41 yr old female who presented to the [facility] with almost 24 hour h/o abdominal pain with nausea.  The pain has been located in the right upper quadrant.  She reports this began about 2 hours after eating last night and progressively got worse.  She also relates right flank/back pain.  She has a history of this about 6 months ago with similar symptoms but they were not as severe and she was told at that time that she likely had gallstones.  She was given Dexilant at that time.  She denies diarrhea or vomiting.  She denies fevers or chills.  She has felt weak.  Review of Systems: All other negative except as indicated in HPI  History reviewed. No pertinent family history.  Past Medical History  Diagnosis Date  . Anemia     History reviewed. No pertinent past surgical history.  Social History:  reports that she has never smoked. She does not have any smokeless tobacco history on file. She reports that she does not drink alcohol or use illicit drugs.  Allergies:  Allergies  Allergen Reactions  . Mushroom Extract Complex      (Not in a hospital admission)  Blood pressure 119/71, pulse 91, temperature 98.3 F (36.8 C), temperature source Oral, resp. rate 18, SpO2 98.00%. Physical Exam: General:  WDWN in NAD.  Pleasant and cooperative.  Mild distress  HEENT:  NCAT, EOMI, no icterus.  NECK:  Supple  CV:  RRR, no murmur, no JVD.  CHEST:  No scars.  RESPIRATORY:  Breath sounds equal and clear. Respirations nonlabored.  ABDOMEN:  Soft, tender in the right upper quadrant, nondistended, no masses, no organomegaly, active bowel sounds, no scars, no hernias.  GU:  No bulges.  MUSCULOSKELETAL:  FROM, good muscle tone, no edema, no venous stasis changes  SKIN:  Warm and dry, No jaundice or suspicious rashes.  NEUROLOGIC:  Alert and  oriented, answers questions appropriately, moves all four ext equally  Results for orders placed during the hospital encounter of 02/29/12 (from the past 48 hour(s))  CBC     Status: Abnormal   Collection Time   02/29/12 10:23 AM      Component Value Range Comment   WBC 8.1  4.0 - 10.5 (K/uL)    RBC 4.62  3.87 - 5.11 (MIL/uL)    Hemoglobin 11.3 (*) 12.0 - 15.0 (g/dL)    HCT 82.9 (*) 56.2 - 46.0 (%)    MCV 77.3 (*) 78.0 - 100.0 (fL)    MCH 24.5 (*) 26.0 - 34.0 (pg)    MCHC 31.7  30.0 - 36.0 (g/dL)    RDW 13.0 (*) 86.5 - 15.5 (%)    Platelets 427 (*) 150 - 400 (K/uL)   DIFFERENTIAL     Status: Abnormal   Collection Time   02/29/12 10:23 AM      Component Value Range Comment   Neutrophils Relative 63  43 - 77 (%)    Neutro Abs 5.1  1.7 - 7.7 (K/uL)    Lymphocytes Relative 25  12 - 46 (%)    Lymphs Abs 2.0  0.7 - 4.0 (K/uL)    Monocytes Relative 5  3 - 12 (%)    Monocytes Absolute 0.4  0.1 - 1.0 (K/uL)    Eosinophils Relative 7 (*) 0 - 5 (%)    Eosinophils Absolute 0.6  0.0 - 0.7 (K/uL)    Basophils Relative 0  0 - 1 (%)    Basophils Absolute 0.0  0.0 - 0.1 (K/uL)   COMPREHENSIVE METABOLIC PANEL     Status: Abnormal   Collection Time   02/29/12 10:23 AM      Component Value Range Comment   Sodium 139  135 - 145 (mEq/L)    Potassium 4.1  3.5 - 5.1 (mEq/L)    Chloride 106  96 - 112 (mEq/L)    CO2 25  19 - 32 (mEq/L)    Glucose, Bld 109 (*) 70 - 99 (mg/dL)    BUN 8  6 - 23 (mg/dL)    Creatinine, Ser 1.47  0.50 - 1.10 (mg/dL)    Calcium 9.3  8.4 - 10.5 (mg/dL)    Total Protein 7.6  6.0 - 8.3 (g/dL)    Albumin 3.3 (*) 3.5 - 5.2 (g/dL)    AST 14  0 - 37 (U/L)    ALT 10  0 - 35 (U/L)    Alkaline Phosphatase 79  39 - 117 (U/L)    Total Bilirubin 0.3  0.3 - 1.2 (mg/dL)    GFR calc non Af Amer >90  >90 (mL/min)    GFR calc Af Amer >90  >90 (mL/min)   LIPASE, BLOOD     Status: Normal   Collection Time   02/29/12 10:23 AM      Component Value Range Comment   Lipase 22  11 - 59 (U/L)     URINALYSIS, ROUTINE W REFLEX MICROSCOPIC     Status: Normal   Collection Time   02/29/12 11:49 AM      Component Value Range Comment   Color, Urine YELLOW  YELLOW     APPearance CLEAR  CLEAR     Specific Gravity, Urine 1.007  1.005 - 1.030     pH 6.0  5.0 - 8.0     Glucose, UA NEGATIVE  NEGATIVE (mg/dL)    Hgb urine dipstick NEGATIVE  NEGATIVE     Bilirubin Urine NEGATIVE  NEGATIVE     Ketones, ur NEGATIVE  NEGATIVE (mg/dL)    Protein, ur NEGATIVE  NEGATIVE (mg/dL)    Urobilinogen, UA 0.2  0.0 - 1.0 (mg/dL)    Nitrite NEGATIVE  NEGATIVE     Leukocytes, UA NEGATIVE  NEGATIVE  MICROSCOPIC NOT DONE ON URINES WITH NEGATIVE PROTEIN, BLOOD, LEUKOCYTES, NITRITE, OR GLUCOSE <1000 mg/dL.  PREGNANCY, URINE     Status: Normal   Collection Time   02/29/12 11:49 AM      Component Value Range Comment   Preg Test, Ur NEGATIVE  NEGATIVE     US Abdomen Complete  02/29/2012  *RADIOLOGY REPORT*  Clinical Data:  Right upper quadrant abdominal pain.  COMPLETE ABDOMINAL ULTRASOUND  Comparison:  12/30/2010  Findings:  Gallbladder:  Gallstone 1.5 cm.  No wall thickening or pericholecystic fluid.  Sonographic Murphy's sign was not elicited; the patient was premedicated prior to the exam.  Common bile duct: Normal, 5 mm.  Liver: Increasing echogenicity.  IVC: Negative  Pancreas:  Negative.  Partially obscured pancreatic tail.  Spleen:  Normal in size and echogenicity.  Right Kidney:  11.3 cm. No hydronephrosis.  Left Kidney:  12.0 cm. No hydronephrosis.  Abdominal aorta:  Nonaneurysmal without ascites.  IMPRESSION:  1.  Cholelithiasis.  No specific evidence of acute cholecystitis. 2.  Hepatic steatosis.  Original Report Authenticated By: Consuello Bossier, M.D.       Assessment/Plan  1.  Abd pain with cholithiasis: will admit patient to med-surg, NPO, IVFs, start on IV abx, iv pain meds and prn zofran.  We will plan for laparoscopic cholecystectomy tomorrow given her persistent and recurring symptoms.  RBA were  discussed with the patient including bleeding, infection, bowel injury, and conversion to open case.  Patient expresses understanding.  Dr. Derrell Lolling has seen and evaluated the patient as well and agrees with the above A and P.    WHITE, ELIZABETH 02/29/2012, 4:11 PM     Surgery attending note:  I have personally interviewed and examined this patient. I reviewed the lab work and x-ray. I agree with evaluation and treatment plan as outlined by Kerman Passey., PA.  The patient will be admitted to the hospital slight external antibiotics, analgesics, anti-emetics, and deflated we would rehydration. If the schedule will allow, she will be taken to the operating room tomorrow for laparoscopic cholecystectomy with cholangiogram, possible open. I discussed the indications and details of the surgery with her. I discussed techniques of surgery and the numerous risks involved in this procedure. All of her questions are answered. She seems to understand these issues well. She agrees with this plan.   Angelia Mould. Derrell Lolling, M.D., [facility] General and Minimally invasive Surgery Breast and Colorectal Surgery Office:   [phone] Pager:   [phone]

## 2012-02-29 NOTE — ED Notes (Signed)
Pt. Is unable to void at this time.  

## 2012-02-29 NOTE — ED Provider Notes (Signed)
History     CSN: [id number]  Arrival date & time 02/29/12  4098   First MD Initiated Contact with Patient 02/29/12 1001      Chief Complaint  Patient presents with  . Abdominal Pain  . Nausea    (Consider location/radiation/quality/duration/timing/severity/associated sxs/prior treatment) Patient is a 41 y.o. female presenting with abdominal pain. The history is provided by the patient.  Abdominal Pain The primary symptoms of the illness include abdominal pain. Episode onset: off and on for the last week but got severe this am about 1am. The onset of the illness was sudden. The problem has been gradually worsening.  The abdominal pain is located in the RUQ. The abdominal pain radiates to the back. The severity of the abdominal pain is 8/10. The abdominal pain is relieved by nothing.  The patient states that she believes she is currently not pregnant. Additional symptoms associated with the illness include anorexia and back pain. Symptoms associated with the illness do not include diaphoresis, constipation or hematuria.  Pt had a roast and cabbage last night.   No particular relation to certain foods.  No blood in her stool.  No vaginal bleeding or discharge  Past Medical History  Diagnosis Date  . Anemia     History reviewed. No pertinent past surgical history. C section  History reviewed. No pertinent family history.  History  Substance Use Topics  . Smoking status: Never Smoker   . Smokeless tobacco: Not on file  . Alcohol Use: No    OB History    Grav Para Term Preterm Abortions TAB SAB Ect Mult Living                  Review of Systems  Constitutional: Negative for diaphoresis.  Gastrointestinal: Positive for abdominal pain and anorexia. Negative for constipation.  Genitourinary: Negative for hematuria.  Musculoskeletal: Positive for back pain.  All other systems reviewed and are negative.    Allergies  Mushroom extract complex  Home Medications  No current  outpatient prescriptions on file.  BP 135/80  Pulse 89  Temp 97.6 F (36.4 C)  Resp 18  SpO2 99%  Physical Exam  Nursing note and vitals reviewed. Constitutional: She appears well-developed and well-nourished. She appears distressed.       Obese, uncomfortable  HENT:  Head: Normocephalic and atraumatic.  Right Ear: External ear normal.  Left Ear: External ear normal.  Eyes: Conjunctivae are normal. Right eye exhibits no discharge. Left eye exhibits no discharge. No scleral icterus.  Neck: Neck supple. No tracheal deviation present.  Cardiovascular: Normal rate, regular rhythm and intact distal pulses.   Pulmonary/Chest: Effort normal and breath sounds normal. No stridor. No respiratory distress. She has no wheezes. She has no rales.  Abdominal: Soft. Bowel sounds are normal. She exhibits no distension, no ascites and no mass. There is tenderness in the right upper quadrant. There is guarding. There is no rigidity and no rebound.  Musculoskeletal: She exhibits no edema and no tenderness.  Neurological: She is alert. She has normal strength. No sensory deficit. Cranial nerve deficit:  no gross defecits noted. She exhibits normal muscle tone. She displays no seizure activity. Coordination normal.  Skin: Skin is warm and dry. No rash noted.  Psychiatric: She has a normal mood and affect.    ED Course  Procedures (including critical care time)  Labs Reviewed  CBC - Abnormal; Notable for the following:    Hemoglobin 11.3 (*)    HCT 35.7 (*)  MCV 77.3 (*)    MCH 24.5 (*)    RDW 16.1 (*)    Platelets 427 (*)    All other components within normal limits  DIFFERENTIAL - Abnormal; Notable for the following:    Eosinophils Relative 7 (*)    All other components within normal limits  COMPREHENSIVE METABOLIC PANEL - Abnormal; Notable for the following:    Glucose, Bld 109 (*)    Albumin 3.3 (*)    All other components within normal limits  URINALYSIS, ROUTINE W REFLEX MICROSCOPIC    LIPASE, BLOOD  PREGNANCY, URINE   US Abdomen Complete  02/29/2012  *RADIOLOGY REPORT*  Clinical Data:  Right upper quadrant abdominal pain.  COMPLETE ABDOMINAL ULTRASOUND  Comparison:  12/30/2010  Findings:  Gallbladder:  Gallstone 1.5 cm.  No wall thickening or pericholecystic fluid.  Sonographic Murphy's sign was not elicited; the patient was premedicated prior to the exam.  Common bile duct: Normal, 5 mm.  Liver: Increasing echogenicity.  IVC: Negative  Pancreas:  Negative.  Partially obscured pancreatic tail.  Spleen:  Normal in size and echogenicity.  Right Kidney:  11.3 cm. No hydronephrosis.  Left Kidney:  12.0 cm. No hydronephrosis.  Abdominal aorta:  Nonaneurysmal without ascites.  IMPRESSION:  1.  Cholelithiasis.  No specific evidence of acute cholecystitis. 2.  Hepatic steatosis.  Original Report Authenticated By: Consuello Bossier, M.D.     1. Cholelithiasis       MDM  Patient's symptoms were suggestive of biliary colic. She is improved with pain medications however she still is having tenderness on exam. Considering the chronicity of her pain associated with her cholelithiasis I will consult general surgery service to discuss cholecystectomy        Celene Kras, MD 02/29/12 1521

## 2012-03-01 LAB — SURGICAL PCR SCREEN: MRSA, PCR: NEGATIVE

## 2012-03-01 MED ORDER — ACETAMINOPHEN 325 MG PO TABS
650.0000 mg | ORAL_TABLET | Freq: Once | ORAL | Status: AC
  Administered 2012-03-01: 650 mg via ORAL
  Filled 2012-03-01: qty 2

## 2012-03-01 NOTE — Progress Notes (Signed)
Subjective: Had a relatively quiet night. No nausea. Still has a little bit of pain. Using pain medicine. Afebrile. Normal vital signs.  Objective: Vital signs in last 24 hours: Temp:  [97.6 F (36.4 C)-98.4 F (36.9 C)] 98.2 F (36.8 C) (06/06 2211) Pulse Rate:  [74-91] 74  (06/06 2211) Resp:  [16-18] 16  (06/06 2211) BP: (112-135)/(57-80) 119/57 mmHg (06/06 2211) SpO2:  [98 %-100 %] 98 % (06/06 2211) Last BM Date: 02/28/12  Intake/Output from previous day: 06/06 0701 - 06/07 0700 In: 325 [I.V.:325] Out: 500 [Urine:500] Intake/Output this shift: Total I/O In: 325 [I.V.:325] Out: 500 [Urine:500]  Physical Exam: General appearance: alert. Stable.. No status normal. No distress. GI: mild right upper quadrant tenderness. No mass. No distention. Obese.  Lab Results:  Results for orders placed during the hospital encounter of 02/29/12 (from the past 24 hour(s))  CBC     Status: Abnormal   Collection Time   02/29/12 10:23 AM      Component Value Range   WBC 8.1  4.0 - 10.5 (K/uL)   RBC 4.62  3.87 - 5.11 (MIL/uL)   Hemoglobin 11.3 (*) 12.0 - 15.0 (g/dL)   HCT 16.1 (*) 09.6 - 46.0 (%)   MCV 77.3 (*) 78.0 - 100.0 (fL)   MCH 24.5 (*) 26.0 - 34.0 (pg)   MCHC 31.7  30.0 - 36.0 (g/dL)   RDW 04.5 (*) 40.9 - 15.5 (%)   Platelets 427 (*) 150 - 400 (K/uL)  DIFFERENTIAL     Status: Abnormal   Collection Time   02/29/12 10:23 AM      Component Value Range   Neutrophils Relative 63  43 - 77 (%)   Neutro Abs 5.1  1.7 - 7.7 (K/uL)   Lymphocytes Relative 25  12 - 46 (%)   Lymphs Abs 2.0  0.7 - 4.0 (K/uL)   Monocytes Relative 5  3 - 12 (%)   Monocytes Absolute 0.4  0.1 - 1.0 (K/uL)   Eosinophils Relative 7 (*) 0 - 5 (%)   Eosinophils Absolute 0.6  0.0 - 0.7 (K/uL)   Basophils Relative 0  0 - 1 (%)   Basophils Absolute 0.0  0.0 - 0.1 (K/uL)  COMPREHENSIVE METABOLIC PANEL     Status: Abnormal   Collection Time   02/29/12 10:23 AM      Component Value Range   Sodium 139  135 - 145  (mEq/L)   Potassium 4.1  3.5 - 5.1 (mEq/L)   Chloride 106  96 - 112 (mEq/L)   CO2 25  19 - 32 (mEq/L)   Glucose, Bld 109 (*) 70 - 99 (mg/dL)   BUN 8  6 - 23 (mg/dL)   Creatinine, Ser 8.11  0.50 - 1.10 (mg/dL)   Calcium 9.3  8.4 - 91.4 (mg/dL)   Total Protein 7.6  6.0 - 8.3 (g/dL)   Albumin 3.3 (*) 3.5 - 5.2 (g/dL)   AST 14  0 - 37 (U/L)   ALT 10  0 - 35 (U/L)   Alkaline Phosphatase 79  39 - 117 (U/L)   Total Bilirubin 0.3  0.3 - 1.2 (mg/dL)   GFR calc non Af Amer >90  >90 (mL/min)   GFR calc Af Amer >90  >90 (mL/min)  LIPASE, BLOOD     Status: Normal   Collection Time   02/29/12 10:23 AM      Component Value Range   Lipase 22  11 - 59 (U/L)  URINALYSIS, ROUTINE W REFLEX MICROSCOPIC  Status: Normal   Collection Time   02/29/12 11:49 AM      Component Value Range   Color, Urine YELLOW  YELLOW    APPearance CLEAR  CLEAR    Specific Gravity, Urine 1.007  1.005 - 1.030    pH 6.0  5.0 - 8.0    Glucose, UA NEGATIVE  NEGATIVE (mg/dL)   Hgb urine dipstick NEGATIVE  NEGATIVE    Bilirubin Urine NEGATIVE  NEGATIVE    Ketones, ur NEGATIVE  NEGATIVE (mg/dL)   Protein, ur NEGATIVE  NEGATIVE (mg/dL)   Urobilinogen, UA 0.2  0.0 - 1.0 (mg/dL)   Nitrite NEGATIVE  NEGATIVE    Leukocytes, UA NEGATIVE  NEGATIVE   PREGNANCY, URINE     Status: Normal   Collection Time   02/29/12 11:49 AM      Component Value Range   Preg Test, Ur NEGATIVE  NEGATIVE   SURGICAL PCR SCREEN     Status: Normal   Collection Time   03/01/12  3:57 AM      Component Value Range   MRSA, PCR NEGATIVE  NEGATIVE    Staphylococcus aureus NEGATIVE  NEGATIVE      Studies/Results: @RISRSLT24 @     . sodium chloride  1,000 mL Intravenous Once  .  ceFAZolin (ANCEF) IV  2 g Intravenous Q8H  .  ceFAZolin (ANCEF) IV  2 g Intravenous 60 min Pre-Op  . chlorhexidine  1 application Topical Once  . HYDROmorphone  1 mg Intravenous Once  . ondansetron  4 mg Intravenous Once  . pantoprazole (PROTONIX) IV  40 mg Intravenous QHS      Assessment/Plan: s/p Procedure(s): LAPAROSCOPIC CHOLECYSTECTOMY WITH INTRAOPERATIVE CHOLANGIOGRAM  Stable. To proceed with laparoscopic cholecystectomy today. All questions answered.  Patient Active Hospital Problem List: No active hospital problems.   LOS: 1 day    Keean Wilmeth M. Derrell Lolling, M.D., [facility] General and Minimally invasive Surgery Breast and Colorectal Surgery Office:   [phone] Pager:   [phone]  03/01/2012  . .prob

## 2012-03-01 NOTE — Clinical Social Work Psychosocial (Signed)
     Clinical Social Work Department BRIEF PSYCHOSOCIAL ASSESSMENT 03/01/2012  Patient:  [name] [name], [name] [name]     Account Number:  [id number]     Admit date:  02/29/2012  Clinical Social Worker:  Dennison Bulla  Date/Time:  03/01/2012 10:00 AM  Referred by:  Physician  Date Referred:  03/01/2012 Referred for  Other - See comment   Other Referral:   Community referral   Interview type:  Patient Other interview type:   Sister present    PSYCHOSOCIAL DATA Living Status:  FAMILY Admitted from facility:   Level of care:   Primary support name:  Bonita Quin Primary support relationship to patient:  PARENT Degree of support available:   Strong    CURRENT CONCERNS Current Concerns  Financial Resources   Other Concerns:    SOCIAL WORK ASSESSMENT / PLAN CSW received referral to assist with patient who was recently separated from partner and living with her parents with her child. CSW reviewed chart and met with patient at bedside. Sister was present. Patient agreeable to sister being involved in assessment.    CSW introduced myself and explained role. CSW spoke with patient regarding her support system and any needs. Patient reports that she has 1 child and is currently living with her parents. Patient reports that her sisters are very supportive and that she has no current needs. CSW offered community resources but patient politely declined. CSW encouraged patient to contact CSW with any further needs if they arise.    CSW is signing off but available if needed.   Assessment/plan status:  No Further Intervention Required Other assessment/ plan:   Information/referral to community resources:   Patient declined all resources    PATIENTS/FAMILYS RESPONSE TO PLAN OF CARE: Patient was alert and oriented. Patient reported no concerns and a strong family support. Patient was appreciative of consult and will contact CSW if further needs arise.

## 2012-03-01 NOTE — Progress Notes (Addendum)
INITIAL ADULT NUTRITION ASSESSMENT Date: 03/01/2012   Time: 1:37 PM  Reason for Assessment: Nutrition risk, weight loss  ASSESSMENT: Female 41 y.o.  Dx: Abdominal pain with cholelithiasis  Hx:  Past Medical History  Diagnosis Date  . Anemia   . Hypertension   . Pneumonia 2006  . Migraines   . Headache 02/29/12    "frequent"  . Arthritis     "left knee"  . Chronic lower back pain     "on the left side"  . Anxiety   . Stress At Home     "and at work"   Related Meds:     . acetaminophen  650 mg Oral Once  .  ceFAZolin (ANCEF) IV  2 g Intravenous Q8H  .  ceFAZolin (ANCEF) IV  2 g Intravenous 60 min Pre-Op  . chlorhexidine  1 application Topical Once  . pantoprazole (PROTONIX) IV  40 mg Intravenous QHS   Ht: 5' (152.4 cm)  Wt: 254 lb (115.214 kg)  Ideal Wt: 45.5 kg  % Ideal Wt: 253%  Usual Wt: 125 kg % Usual Wt: 92%  Body mass index is 49.61 kg/(m^2).  Food/Nutrition Related Hx: Patient reports that she has not been eating well over the last month due to abdominal pain and life stressors. She has lost 8% of her usual body weight over this time. She meets the criteria for severe malnutrition in the context of acute illness with weight loss and PO intake <50% of estimated needs. She reports that while weight loss is desired, she is aware that fast unintentional weight loss is not healthy. She is currently NPO, but states that she is hungry. She is scheduled to undergo cholesystectomy this afternoon.   Labs:  CMP     Component Value Date/Time   NA 139 02/29/2012 1023   K 4.1 02/29/2012 1023   CL 106 02/29/2012 1023   CO2 25 02/29/2012 1023   GLUCOSE 109* 02/29/2012 1023   BUN 8 02/29/2012 1023   CREATININE 0.78 02/29/2012 1023   CALCIUM 9.3 02/29/2012 1023   PROT 7.6 02/29/2012 1023   ALBUMIN 3.3* 02/29/2012 1023   AST 14 02/29/2012 1023   ALT 10 02/29/2012 1023   ALKPHOS 79 02/29/2012 1023   BILITOT 0.3 02/29/2012 1023   GFRNONAA >90 02/29/2012 1023   GFRAA >90 02/29/2012 1023      Intake/Output Summary (Last 24 hours) at 03/01/12 1344 Last data filed at 03/01/12 0537  Gross per 24 hour  Intake   1261 ml  Output    950 ml  Net    311 ml    Diet Order: NPO  Supplements/Tube Feeding: None  IVF:    0.9 % NaCl with KCl 20 mEq / L Last Rate: 125 mL/hr at 03/01/12 0402  DISCONTD: sodium chloride Last Rate: 1,000 mL (02/29/12 1148)    Estimated Nutritional Needs:   Kcal: 1900-2100 kcal Protein: 90-105 g Fluid: 2 L  NUTRITION DIAGNOSIS: -Inadequate oral intake (NI-2.1).  Status: Ongoing  RELATED TO: cholelithiasis  AS EVIDENCE BY: NPO status and reported weight loss of 20 pounds  MONITORING/EVALUATION(Goals): Patient will meet 90-100% of estimated needs with diet.   Monitor: Diet advancement, PO intake, weight trends, labs  EDUCATION NEEDS: -Education needs addressed  INTERVENTION: 1. Briefly discussed healthy weight loss with patient. She was encouraged to eat at least 3 meals daily to achieve slow healthy weight loss once diet advanced.  2. Advance diet as tolerated.   DOCUMENTATION CODES Per approved criteria  -  Severe malnutrition in the context of acute illness or injury -Morbid obesity    Linnell Fulling [facility] 03/01/2012, 1:37 PM

## 2012-03-02 ENCOUNTER — Encounter (HOSPITAL_COMMUNITY): Payer: Self-pay | Admitting: Anesthesiology

## 2012-03-02 ENCOUNTER — Inpatient Hospital Stay (HOSPITAL_COMMUNITY): Payer: Medicaid Other | Admitting: Anesthesiology

## 2012-03-02 ENCOUNTER — Encounter (HOSPITAL_COMMUNITY): Admission: EM | Disposition: A | Discharge status: Home / Self Care | Payer: Self-pay | Source: Ambulatory Visit

## 2012-03-02 HISTORY — PX: CHOLECYSTECTOMY: SHX55

## 2012-03-02 SURGERY — LAPAROSCOPIC CHOLECYSTECTOMY
Anesthesia: General | Site: Abdomen | Wound class: Clean Contaminated

## 2012-03-02 MED ORDER — ONDANSETRON HCL 4 MG/2ML IJ SOLN
INTRAMUSCULAR | Status: DC | PRN
  Administered 2012-03-02: 4 mg via INTRAVENOUS

## 2012-03-02 MED ORDER — MIDAZOLAM HCL 5 MG/5ML IJ SOLN
INTRAMUSCULAR | Status: DC | PRN
  Administered 2012-03-02: 2 mg via INTRAVENOUS

## 2012-03-02 MED ORDER — POTASSIUM CHLORIDE IN NACL 20-0.9 MEQ/L-% IV SOLN
INTRAVENOUS | Status: DC
  Administered 2012-03-02 – 2012-03-03 (×2): via INTRAVENOUS
  Filled 2012-03-02 (×4): qty 1000

## 2012-03-02 MED ORDER — FENTANYL CITRATE 0.05 MG/ML IJ SOLN
INTRAMUSCULAR | Status: DC | PRN
  Administered 2012-03-02 (×2): 100 ug via INTRAVENOUS
  Administered 2012-03-02: 50 ug via INTRAVENOUS
  Administered 2012-03-02: 100 ug via INTRAVENOUS

## 2012-03-02 MED ORDER — DROPERIDOL 2.5 MG/ML IJ SOLN
INTRAMUSCULAR | Status: DC | PRN
  Administered 2012-03-02: 0.625 mg via INTRAVENOUS

## 2012-03-02 MED ORDER — BUPIVACAINE-EPINEPHRINE 0.25% -1:200000 IJ SOLN
INTRAMUSCULAR | Status: DC | PRN
  Administered 2012-03-02: 20 mL

## 2012-03-02 MED ORDER — OXYCODONE-ACETAMINOPHEN 5-325 MG PO TABS
1.0000 | ORAL_TABLET | ORAL | Status: DC | PRN
  Administered 2012-03-02 – 2012-03-03 (×3): 2 via ORAL
  Administered 2012-03-03: 1 via ORAL
  Filled 2012-03-02: qty 1
  Filled 2012-03-02 (×3): qty 2

## 2012-03-02 MED ORDER — SODIUM CHLORIDE 0.9 % IR SOLN
Status: DC | PRN
  Administered 2012-03-02: 1000 mL

## 2012-03-02 MED ORDER — GLYCOPYRROLATE 0.2 MG/ML IJ SOLN
INTRAMUSCULAR | Status: DC | PRN
  Administered 2012-03-02: .6 mg via INTRAVENOUS

## 2012-03-02 MED ORDER — METOCLOPRAMIDE HCL 5 MG/ML IJ SOLN
INTRAMUSCULAR | Status: DC | PRN
  Administered 2012-03-02: 10 mg via INTRAVENOUS

## 2012-03-02 MED ORDER — DROPERIDOL 2.5 MG/ML IJ SOLN: 0.6250 mg | INTRAMUSCULAR | Status: DC | PRN

## 2012-03-02 MED ORDER — ENOXAPARIN SODIUM 40 MG/0.4ML ~~LOC~~ SOLN
40.0000 mg | SUBCUTANEOUS | Status: DC
  Filled 2012-03-02: qty 0.4

## 2012-03-02 MED ORDER — ROCURONIUM BROMIDE 100 MG/10ML IV SOLN
INTRAVENOUS | Status: DC | PRN
  Administered 2012-03-02: 40 mg via INTRAVENOUS

## 2012-03-02 MED ORDER — DEXTROSE 5 % IV SOLN
INTRAVENOUS | Status: DC | PRN
  Administered 2012-03-02: 15:00:00 via INTRAVENOUS

## 2012-03-02 MED ORDER — PROPOFOL 10 MG/ML IV EMUL
INTRAVENOUS | Status: DC | PRN
  Administered 2012-03-02: 200 mg via INTRAVENOUS

## 2012-03-02 MED ORDER — LACTATED RINGERS IV SOLN
INTRAVENOUS | Status: DC | PRN
  Administered 2012-03-02 (×2): via INTRAVENOUS

## 2012-03-02 MED ORDER — HYDROMORPHONE HCL PF 1 MG/ML IJ SOLN
0.2500 mg | INTRAMUSCULAR | Status: DC | PRN
  Administered 2012-03-02 (×2): 0.5 mg via INTRAVENOUS

## 2012-03-02 MED ORDER — DEXAMETHASONE SODIUM PHOSPHATE 4 MG/ML IJ SOLN
INTRAMUSCULAR | Status: DC | PRN
  Administered 2012-03-02: 8 mg via INTRAVENOUS

## 2012-03-02 MED ORDER — SUCCINYLCHOLINE CHLORIDE 20 MG/ML IJ SOLN
INTRAMUSCULAR | Status: DC | PRN
  Administered 2012-03-02: 100 mg via INTRAVENOUS

## 2012-03-02 MED ORDER — LIDOCAINE HCL (CARDIAC) 20 MG/ML IV SOLN
INTRAVENOUS | Status: DC | PRN
  Administered 2012-03-02: 100 mg via INTRAVENOUS

## 2012-03-02 MED ORDER — NEOSTIGMINE METHYLSULFATE 1 MG/ML IJ SOLN
INTRAMUSCULAR | Status: DC | PRN
  Administered 2012-03-02: 5 mg via INTRAVENOUS

## 2012-03-02 SURGICAL SUPPLY — 50 items
APPLIER CLIP 5 13 M/L LIGAMAX5 (MISCELLANEOUS) ×3
APPLIER CLIP ROT 10 11.4 M/L (STAPLE)
BLADE SURG ROTATE 9660 (MISCELLANEOUS) IMPLANT
CANISTER SUCTION 2500CC (MISCELLANEOUS) ×3 IMPLANT
CHLORAPREP W/TINT 26ML (MISCELLANEOUS) ×3 IMPLANT
CLIP APPLIE 5 13 M/L LIGAMAX5 (MISCELLANEOUS) ×2 IMPLANT
CLIP APPLIE ROT 10 11.4 M/L (STAPLE) IMPLANT
CLOTH BEACON ORANGE TIMEOUT ST (SAFETY) ×3 IMPLANT
CLSR STERI-STRIP ANTIMIC 1/2X4 (GAUZE/BANDAGES/DRESSINGS) ×3 IMPLANT
COVER MAYO STAND STRL (DRAPES) ×3 IMPLANT
COVER SURGICAL LIGHT HANDLE (MISCELLANEOUS) ×3 IMPLANT
DECANTER SPIKE VIAL GLASS SM (MISCELLANEOUS) ×6 IMPLANT
DERMABOND ADHESIVE PROPEN (GAUZE/BANDAGES/DRESSINGS) ×1
DERMABOND ADVANCED (GAUZE/BANDAGES/DRESSINGS) ×1
DERMABOND ADVANCED .7 DNX12 (GAUZE/BANDAGES/DRESSINGS) ×2 IMPLANT
DERMABOND ADVANCED .7 DNX6 (GAUZE/BANDAGES/DRESSINGS) ×2 IMPLANT
DRAPE C-ARM 42X72 X-RAY (DRAPES) ×3 IMPLANT
DRAPE UTILITY 15X26 W/TAPE STR (DRAPE) ×6 IMPLANT
DRSG TEGADERM 4X4.75 (GAUZE/BANDAGES/DRESSINGS) ×3 IMPLANT
ELECT REM PT RETURN 9FT ADLT (ELECTROSURGICAL) ×3
ELECTRODE REM PT RTRN 9FT ADLT (ELECTROSURGICAL) ×2 IMPLANT
GAUZE SPONGE 2X2 8PLY STRL LF (GAUZE/BANDAGES/DRESSINGS) ×2 IMPLANT
GLOVE BIO SURGEON STRL SZ7 (GLOVE) ×3 IMPLANT
GLOVE BIO SURGEON STRL SZ8 (GLOVE) ×3 IMPLANT
GLOVE BIOGEL PI IND STRL 7.0 (GLOVE) ×2 IMPLANT
GLOVE BIOGEL PI IND STRL 8 (GLOVE) ×6 IMPLANT
GLOVE BIOGEL PI INDICATOR 7.0 (GLOVE) ×1
GLOVE BIOGEL PI INDICATOR 8 (GLOVE) ×3
GLOVE ECLIPSE 7.5 STRL STRAW (GLOVE) ×3 IMPLANT
GOWN EXTRA PROTECTION XXL 0583 (GOWNS) ×3 IMPLANT
GOWN STRL NON-REIN LRG LVL3 (GOWN DISPOSABLE) ×9 IMPLANT
KIT BASIN OR (CUSTOM PROCEDURE TRAY) ×3 IMPLANT
KIT ROOM TURNOVER OR (KITS) ×3 IMPLANT
NS IRRIG 1000ML POUR BTL (IV SOLUTION) ×3 IMPLANT
PAD ARMBOARD 7.5X6 YLW CONV (MISCELLANEOUS) ×6 IMPLANT
POUCH SPECIMEN RETRIEVAL 10MM (ENDOMECHANICALS) ×3 IMPLANT
SCISSORS LAP 5X35 DISP (ENDOMECHANICALS) IMPLANT
SET CHOLANGIOGRAPH 5 50 .035 (SET/KITS/TRAYS/PACK) ×3 IMPLANT
SET IRRIG TUBING LAPAROSCOPIC (IRRIGATION / IRRIGATOR) ×3 IMPLANT
SLEEVE ENDOPATH XCEL 5M (ENDOMECHANICALS) ×6 IMPLANT
SPECIMEN JAR SMALL (MISCELLANEOUS) ×3 IMPLANT
SPONGE GAUZE 2X2 STER 10/PKG (GAUZE/BANDAGES/DRESSINGS) ×1
SUT MNCRL AB 4-0 PS2 18 (SUTURE) ×3 IMPLANT
TOWEL OR 17X24 6PK STRL BLUE (TOWEL DISPOSABLE) ×3 IMPLANT
TOWEL OR 17X26 10 PK STRL BLUE (TOWEL DISPOSABLE) ×3 IMPLANT
TRAY LAPAROSCOPIC (CUSTOM PROCEDURE TRAY) ×3 IMPLANT
TROCAR XCEL BLUNT TIP 100MML (ENDOMECHANICALS) ×3 IMPLANT
TROCAR XCEL NON-BLD 11X100MML (ENDOMECHANICALS) IMPLANT
TROCAR XCEL NON-BLD 5MMX100MML (ENDOMECHANICALS) ×3 IMPLANT
WATER STERILE IRR 1000ML POUR (IV SOLUTION) IMPLANT

## 2012-03-02 NOTE — Anesthesia Preprocedure Evaluation (Signed)
Anesthesia Evaluation  Patient identified by MRN, date of birth, ID band Patient awake    Reviewed: Allergy & Precautions, H&P , NPO status , Patient's Chart, lab work & pertinent test results, reviewed documented beta blocker date and time   Airway Mallampati: II TM Distance: >3 FB     Dental  (+) Teeth Intact, Missing and Dental Advisory Given   Pulmonary neg pulmonary ROS,    Pulmonary exam normal       Cardiovascular negative cardio ROS  Rhythm:Regular Rate:Normal     Neuro/Psych PSYCHIATRIC DISORDERS Anxiety    GI/Hepatic Neg liver ROS,   Endo/Other  Morbid obesity  Renal/GU negative Renal ROS     Musculoskeletal   Abdominal   Peds  Hematology   Anesthesia Other Findings   Reproductive/Obstetrics                           Anesthesia Physical Anesthesia Plan  ASA: III  Anesthesia Plan: General   Post-op Pain Management:    Induction: Intravenous  Airway Management Planned: Oral ETT  Additional Equipment:   Intra-op Plan:   Post-operative Plan:   Informed Consent: I have reviewed the patients History and Physical, chart, labs and discussed the procedure including the risks, benefits and alternatives for the proposed anesthesia with the patient or authorized representative who has indicated his/her understanding and acceptance.   Dental advisory given  Plan Discussed with: CRNA, Anesthesiologist and Surgeon  Anesthesia Plan Comments:         Anesthesia Quick Evaluation

## 2012-03-02 NOTE — Transfer of Care (Signed)
Immediate Anesthesia Transfer of Care Note  Patient: [name] [name]  Procedure(s) Performed: Procedure(s) (LRB): LAPAROSCOPIC CHOLECYSTECTOMY ()  Patient Location: PACU  Anesthesia Type: General  Level of Consciousness: oriented and sedated  Airway & Oxygen Therapy: Patient Spontanous Breathing and Patient connected to nasal cannula oxygen  Post-op Assessment: Report given to PACU RN, Post -op Vital signs reviewed and stable, Patient moving all extremities and Patient moving all extremities X 4  Post vital signs: Reviewed and stable  Complications: No apparent anesthesia complications

## 2012-03-02 NOTE — Preoperative (Signed)
Beta Blockers   Reason not to administer Beta Blockers:Not Applicable 

## 2012-03-02 NOTE — Progress Notes (Signed)
Spoke with patient this morning and introduced myself as the surgeon to be doing procedure today.  She agrees to go ahead with surgery.  About 10 AM.    [name] [name]. Gae Bon, MD, FACS [phone] [facility]

## 2012-03-02 NOTE — Op Note (Signed)
OPERATIVE REPORT  DATE OF OPERATION: 02/29/2012 - 03/02/2012  PATIENT:  [name] [name]  41 y.o. female  PRE-OPERATIVE DIAGNOSIS:  cholelithiasis  POST-OPERATIVE DIAGNOSIS:  Cholelithiasis; Biliary Colic  PROCEDURE:  Procedure(s): LAPAROSCOPIC CHOLECYSTECTOMY  SURGEON:  Surgeon(s): Cherylynn Ridges, MD  ASSISTANT: None  ANESTHESIA:   general  EBL: <30 ml  BLOOD ADMINISTERED: none  DRAINS: none    SPECIMEN:  Source of Specimen:  gallbladder  COUNTS CORRECT:  YES  PROCEDURE DETAILS:   The patient was taken to the operating room and placed on the table in the supine position.  After an adequate endotracheal anesthetic was administered, she was prepped with ChloroPrep, and then draped in the usual manner exposing the entire abdomen laterally, inferiorly and up  to the costal margins.  After a proper timeout was performed including identifying the patient and the procedure to be performed, a supra-umbilical1.5cm midline incision was made using a #15 blade.  This was taken down to the fascia which was then grabbed with two Kocher clamps and incised between them using a #15 blade.  The edges of the fascia were tented up with Kocher clamps, however because of the depth of the patient's abdomnal wall, it required the surgeons finger in the fascial incision to lift up the anterior sheath as the posterior sheath was bluntly penetrated with a Kelly clamp.  This was extended into the preperitoneal space.  Once this was done, a pursestring suture of 0 Vicryl was passed around the fascial opening.  This was subsequently used to secure the [facility] cannula which was passed into the peritoneal cavity.  Once the [facility] cannula was in place, carbon dioxide gas was insufflated into the peritoneal cavity up to a maximal intra-abdominal pressure of 15mm Hg.The laparoscope (10mm 30 degree scope), with attached camera and light source, was passed into the peritoneal cavity to visualize the direct insertion of  two right upper quadrant 5mm cannulas, and a sup-xiphoid 5mm cannula.  Once all cannulas were in place, the dissection was begun.  Two ratcheted graspers were attached to the dome and infundibulum of the gallbladder and retracted towards the anterior abdominal wall and the right upper quadrant.  There were omental adhesions to the dome and the body of the gallbladder which were dissected away using blunt traction and electrocautery. Using cautery attached to a dissecting forceps, the peritoneum overlaying the triangle of Chalot and the hepatoduodenal triangle was dissected away exposing the cystic duct and the cystic artery.  A clip was placed on the gallbladder side of the cystic duct, then three clips were placed along the distal side of the cystic duct and then transected.  The gallbladder was then dissected out of the hepatic bed without event.  It was retrieved from the abdomen (using an EndoCatch bag) without event.  Once the gallbladder was removed, the bed was inspected for hemostasis.  Once excellent hemostasis was obtained all gas and fluids were aspirated from above the liver, then the cannulas were removed.  The supra-umbilical incision was closed using the pursestring suture which was in place.  0.25% bupivicaine with epinephrine was injected at all sites.  All 10mm or greater cannula sites were close using a running subcuticular stitch of 4-0 Monocryl.  5.53mm cannula sites were closed with Dermabond only.Steri-Strips and Tagaderm were used to complete the dressings at all sites.  At this point all needle, sponge, and instrument counts were correct.The patient was awakened from anesthesia and taken to the PACU in stable condition  PATIENT DISPOSITION:  PACU - hemodynamically stable.   Renaud Celli III,Yesika Rispoli O 6/8/20134:20 PM

## 2012-03-02 NOTE — Anesthesia Postprocedure Evaluation (Signed)
Anesthesia Post Note  Patient: [name] [name]  Procedure(s) Performed: Procedure(s) (LRB): LAPAROSCOPIC CHOLECYSTECTOMY ()  Anesthesia type: general  Patient location: PACU  Post pain: Pain level controlled  Post assessment: Patient's Cardiovascular Status Stable  Last Vitals:  Filed Vitals:   03/02/12 1645  BP: 125/62  Pulse: 79  Temp:   Resp: 23    Post vital signs: Reviewed and stable  Level of consciousness: sedated  Complications: No apparent anesthesia complications

## 2012-03-02 NOTE — Anesthesia Procedure Notes (Signed)
Procedure Name: Intubation Date/Time: 03/02/2012 3:01 PM Performed by: Wray Kearns A Pre-anesthesia Checklist: Patient identified, Timeout performed, Emergency Drugs available, Suction available and Patient being monitored Patient Re-evaluated:Patient Re-evaluated prior to inductionOxygen Delivery Method: Circle system utilized Preoxygenation: Pre-oxygenation with 100% oxygen Intubation Type: IV induction, Rapid sequence and Cricoid Pressure applied Ventilation: Mask ventilation without difficulty Laryngoscope Size: Mac and 3 Grade View: Grade I Tube type: Oral Tube size: 7.5 mm Number of attempts: 1 Airway Equipment and Method: Stylet Placement Confirmation: ETT inserted through vocal cords under direct vision,  positive ETCO2 and breath sounds checked- equal and bilateral Secured at: 22 cm Tube secured with: Tape Dental Injury: Teeth and Oropharynx as per pre-operative assessment

## 2012-03-03 MED ORDER — OXYCODONE-ACETAMINOPHEN 5-325 MG PO TABS: 1.0000 | ORAL_TABLET | ORAL | Status: AC | PRN

## 2012-03-03 NOTE — Discharge Instructions (Signed)
[facility] ______[facility] °LAPAROSCOPIC SURGERY: POST OP INSTRUCTIONS °Always review your discharge instruction sheet given to you by the facility where your surgery was performed. °IF YOU HAVE DISABILITY OR FAMILY LEAVE FORMS, YOU MUST BRING THEM TO THE OFFICE FOR PROCESSING.   °DO NOT GIVE THEM TO YOUR DOCTOR. ° °1. A prescription for pain medication may be given to you upon discharge.  Take your pain medication as prescribed, if needed.  If narcotic pain medicine is not needed, then you may take acetaminophen (Tylenol) or ibuprofen (Advil) as needed. °2. Take your usually prescribed medications unless otherwise directed. °3. If you need a refill on your pain medication, please contact your pharmacy.  They will contact our office to request authorization. Prescriptions will not be filled after 5pm or on week-ends. °4. You should follow a light diet the first few days after arrival home, such as soup and crackers, etc.  Be sure to include lots of fluids daily. °5. Most patients will experience some swelling and bruising in the area of the incisions.  Ice packs will help.  Swelling and bruising can take several days to resolve.  °6. It is common to experience some constipation if taking pain medication after surgery.  Increasing fluid intake and taking a stool softener (such as Colace) will usually help or prevent this problem from occurring.  A mild laxative (Milk of Magnesia or Miralax) should be taken according to package instructions if there are no bowel movements after 48 hours. °7. Unless discharge instructions indicate otherwise, you may remove your bandages 24-48 hours after surgery, and you may shower at that time.  You may have steri-strips (small skin tapes) in place directly over the incision.  These strips should be left on the skin for 7-10 days.  If your surgeon used skin glue on the incision, you may shower in 24 hours.  The glue will flake off over the next 2-3 weeks.  Any sutures or  staples will be removed at the office during your follow-up visit. °8. ACTIVITIES:  You may resume regular (light) daily activities beginning the next day--such as daily self-care, walking, climbing stairs--gradually increasing activities as tolerated.  You may have sexual intercourse when it is comfortable.  Refrain from any heavy lifting or straining until approved by your doctor. °a. You may drive when you are no longer taking prescription pain medication, you can comfortably wear a seatbelt, and you can safely maneuver your car and apply brakes. °b. RETURN TO WORK:  __________________________________________________________ °9. You should see your doctor in the office for a follow-up appointment approximately 2-3 weeks after your surgery.  Make sure that you call for this appointment within a day or two after you arrive home to insure a convenient appointment time. °10. OTHER INSTRUCTIONS: __________________________________________________________________________________________________________________________ __________________________________________________________________________________________________________________________ °WHEN TO CALL YOUR DOCTOR: °1. Fever over 101.0 °2. Inability to urinate °3. Continued bleeding from incision. °4. Increased pain, redness, or drainage from the incision. °5. Increasing abdominal pain ° °The clinic staff is available to answer your questions during regular business hours.  Please don’t hesitate to call and ask to speak to one of the nurses for clinical concerns.  If you have a medical emergency, go to the nearest emergency room or call 911.  A surgeon from [facility] is always on call at the hospital. °[address], [address], [place], [place]  [address] ? [address] [address], [place], [place]   [address] °[phone] ? [phone] ? FAX [phone] °Web site:   [email or web address] °

## 2012-03-03 NOTE — Progress Notes (Signed)
1 Day Post-Op  Subjective: She had a little bit of nausea with breakfast but did eat solid food. She is sore in the right upper quadrant. She hasn't really ambulated very much yet.  Overall feels better than preop.  Objective: Vital signs in last 24 hours: Temp:  [97.7 F (36.5 C)-98.7 F (37.1 C)] 98.2 F (36.8 C) (06/09 0642) Pulse Rate:  [68-92] 89  (06/09 0642) Resp:  [17-30] 18  (06/09 0642) BP: (114-133)/(60-79) 118/70 mmHg (06/09 0642) SpO2:  [92 %-99 %] 92 % (06/09 0642)   Intake/Output from previous day: 06/08 0701 - 06/09 0700 In: 2200 [I.V.:2000; IV Piggyback:200] Out: 2150 [Urine:2100; Blood:50] Intake/Output this shift:     General appearance: alert, cooperative and mild distress Resp: clear to auscultation bilaterally GI: the abdomen is soft a little bit tender in the right side upper quadrant. No guarding and no rebound. Bowel sounds present.  Incision: healing well  Lab Results:   Basename 02/29/12 1023  WBC 8.1  HGB 11.3*  HCT 35.7*  PLT 427*   BMET  Basename 02/29/12 1023  NA 139  K 4.1  CL 106  CO2 25  GLUCOSE 109*  BUN 8  CREATININE 0.78  CALCIUM 9.3   PT/INR No results found for this basename: LABPROT:2,INR:2 in the last 72 hours ABG No results found for this basename: PHART:2,PCO2:2,PO2:2,HCO3:2 in the last 72 hours  MEDS, Scheduled    . enoxaparin  40 mg Subcutaneous Q24H  . pantoprazole (PROTONIX) IV  40 mg Intravenous QHS  . DISCONTD:  ceFAZolin (ANCEF) IV  2 g Intravenous Q8H  . DISCONTD:  ceFAZolin (ANCEF) IV  2 g Intravenous 60 min Pre-Op    Studies/Results: No results found.  Assessment: s/p Procedure(s): LAPAROSCOPIC CHOLECYSTECTOMY Stable status post arthroscopic cholecystectomy but with some mild nausea this morning. Appears to have usual amount of postop pain. Probably will be able to discharge after lunch and I want to be sure that she is tolerating diet  Plan: probable discharge after lunch   LOS: 3 days      Currie Paris, MD, [facility], [place] [phone]   03/03/2012 9:16 AM

## 2012-03-04 ENCOUNTER — Encounter (HOSPITAL_COMMUNITY): Payer: Self-pay | Admitting: General Surgery

## 2012-03-04 ENCOUNTER — Encounter (INDEPENDENT_AMBULATORY_CARE_PROVIDER_SITE_OTHER): Payer: Self-pay

## 2012-03-08 NOTE — Discharge Summary (Signed)
  Physician Discharge Summary  Patient ID: [name] [name] MRN: [id number] DOB/AGE: [birth year] 40 y.o.  Admit date: 02/29/2012 Discharge date: 03/03/12  Discharge Diagnoses Patient Active Problem List   Diagnosis Date Noted  . Anemia   . NAUSEA 06/07/2007  . OBESITY, MORBID 09/20/2006  . ANEMIA-IRON DEFICIENCY 08/09/2006   Cholithiasis and Cholecystitis  Consultants None  Procedures Laparoscopic Cholecystectomy 03-02-12  HPI: Patient is a 41 yr old female who presented to the [facility] with almost 24 hour h/o abdominal pain with nausea found to have mild cholecystitis and cholithiasis.  Hospital Course: Patient was admitted and started on IV antibiotics, IVFs, and prn nausea and pain medicine.  She was allowed to have some clear liquids.  On 03/02/12 she was taken to the OR and underwent the procedure listed above.  She tolerated this well.  She was taken to the floor where her diet was advanced in the normal fashion.  On POD#1, she was tolerating a regular diet, her vitals were stable, she was walking well and her pain was well controlled.  She was felt stable for discharge home.    Medication List  As of 03/08/2012  9:25 AM   TAKE these medications         bismuth subsalicylate 262 MG/15ML suspension   Commonly known as: PEPTO BISMOL   Take 30 mLs by mouth every 6 (six) hours as needed. For upset stomach      oxyCODONE-acetaminophen 5-325 MG per tablet   Commonly known as: PERCOCET   Take 1 tablet by mouth every 4 (four) hours as needed for pain.             Follow-up Information    Follow up with WYATT, Marta Lamas, MD. Schedule an appointment as soon as possible for a visit in 3 weeks.   Contact information:   [facility] [address] [place] [address] [phone]          Signed: Denny Levy Pager: [phone] [facility] Pager: [phone] Trauma Office number: [phone]  03/08/2012, 9:25 AM

## 2012-03-08 NOTE — Discharge Summary (Signed)
[name] Koman, MD, MPH, FACS Pager: [phone]  

## 2012-03-12 ENCOUNTER — Encounter (INDEPENDENT_AMBULATORY_CARE_PROVIDER_SITE_OTHER): Payer: Self-pay | Admitting: General Surgery

## 2012-03-12 ENCOUNTER — Ambulatory Visit (INDEPENDENT_AMBULATORY_CARE_PROVIDER_SITE_OTHER): Payer: Self-pay | Admitting: General Surgery

## 2012-03-12 ENCOUNTER — Encounter (INDEPENDENT_AMBULATORY_CARE_PROVIDER_SITE_OTHER): Payer: Self-pay

## 2012-03-12 VITALS — BP 100/78 | HR 80 | Temp 97.2°F | Resp 16 | Ht 60.0 in | Wt 259.8 lb

## 2012-03-12 DIAGNOSIS — Z09 Encounter for follow-up examination after completed treatment for conditions other than malignant neoplasm: Secondary | ICD-10-CM | POA: Insufficient documentation

## 2012-03-12 NOTE — Progress Notes (Signed)
HPI The patient is status post laparoscopic cholecystectomy and doing fairly well. She is having some diarrhea which I expect will resolve within the next 2-3 weeks. If it does not then she may require some cholestyramine for treatment  PE On examination in her supraumbilical area just underneath her scar there is a hematoma which I expect will resolve over time. He does not appear to be be infected.  Her other incisions look well. There is no evidence of a hernia at this time however because of her size she could have a hernia that is underneath the hematoma in her periumbilical area. She will need to let us know about that in the future.  Studiy review None  Assessment Doing well with some diarrhea status post laparoscopic cholecystectomy.  Plan Return on a p.r.n. basis. The patient should also let us know if her diarrhea does not resolved over a reasonable amount of time. The option of Questran or cholestyramine remains in the future.

## 2012-03-22 ENCOUNTER — Ambulatory Visit (INDEPENDENT_AMBULATORY_CARE_PROVIDER_SITE_OTHER): Payer: Self-pay | Admitting: Surgery

## 2012-03-22 ENCOUNTER — Encounter (INDEPENDENT_AMBULATORY_CARE_PROVIDER_SITE_OTHER): Payer: Self-pay

## 2012-03-22 ENCOUNTER — Encounter (INDEPENDENT_AMBULATORY_CARE_PROVIDER_SITE_OTHER): Payer: Self-pay | Admitting: Surgery

## 2012-03-22 VITALS — BP 124/78 | HR 71 | Temp 97.7°F | Resp 16 | Ht 62.0 in | Wt 258.4 lb

## 2012-03-22 DIAGNOSIS — Z09 Encounter for follow-up examination after completed treatment for conditions other than malignant neoplasm: Secondary | ICD-10-CM

## 2012-03-22 NOTE — Progress Notes (Signed)
General Surgery [facility]  Visit Diagnoses: 1. Postop check     HISTORY: The patient is a 41 year old black female who underwent laparoscopic cholecystectomy 3 weeks ago by my partner, Dr. Jimmye Norman. Patient has developed a rash at the umbilicus. She is having some itching. She also is having some right upper quadrant discomfort with strenuous lifting at work and would like to have a lifting restriction for the next week.  EXAM: Surgical incisions have healed nicely. No sign of infection. No sign of herniation.    IMPRESSION: There is a small area to the left of the umbilicus with reddish papules. There is no sign of cellulitis.  PLAN: Patient has a rash at the umbilicus it looks like contact dermatitis. This may be related to some of the topical preps or 2 adhesive from dressings. I've asked her to begin applying topical hydrocortisone cream 3 times daily. I think this will relieve her symptoms. Patient has complained of right upper quadrant discomfort with strenuous lifting. We will restrict her lifting to 20 pounds for one week as she is returning to work.  Patient will return to see Dr. Lindie Spruce as needed.  [name] Heckler, MD, FACS General & Endocrine Surgery [facility]

## 2012-03-22 NOTE — Patient Instructions (Signed)
Get hydrocortisone cream (OTC - store brand) and apply to affected area on abdominal wall 3 times daily.  Velora Heckler, MD, [facility] Office: [phone]

## 2012-07-12 ENCOUNTER — Emergency Department (INDEPENDENT_AMBULATORY_CARE_PROVIDER_SITE_OTHER): Payer: Medicaid Other

## 2012-07-12 ENCOUNTER — Encounter (HOSPITAL_COMMUNITY): Payer: Self-pay

## 2012-07-12 ENCOUNTER — Emergency Department (INDEPENDENT_AMBULATORY_CARE_PROVIDER_SITE_OTHER)
Admission: EM | Admit: 2012-07-12 | Discharge: 2012-07-12 | Disposition: A | Discharge status: Home / Self Care | Payer: Medicaid Other | Source: Home / Self Care | Attending: Family Medicine | Admitting: Family Medicine

## 2012-07-12 DIAGNOSIS — M65849 Other synovitis and tenosynovitis, unspecified hand: Secondary | ICD-10-CM

## 2012-07-12 DIAGNOSIS — M65839 Other synovitis and tenosynovitis, unspecified forearm: Secondary | ICD-10-CM

## 2012-07-12 DIAGNOSIS — M778 Other enthesopathies, not elsewhere classified: Secondary | ICD-10-CM

## 2012-07-12 MED ORDER — DICLOFENAC POTASSIUM 50 MG PO TABS: 50.0000 mg | ORAL_TABLET | Freq: Three times a day (TID) | ORAL | Status: DC

## 2012-07-12 NOTE — ED Provider Notes (Signed)
History     CSN: [id number]  Arrival date & time 07/12/12  4098   First MD Initiated Contact with Patient 07/12/12 0902      Chief Complaint  Patient presents with  . Hand Pain    (Consider location/radiation/quality/duration/timing/severity/associated sxs/prior treatment) Patient is a 41 y.o. female presenting with hand pain. The history is provided by the patient.  Hand Pain This is a new problem. The current episode started more than 2 days ago (NKI to thumb, pt does not work.). The problem has been gradually worsening. The symptoms are aggravated by bending.    Past Medical History  Diagnosis Date  . Anemia   . Hypertension   . Pneumonia 2006  . Migraines   . Headache 02/29/12    "frequent"  . Arthritis     "left knee"  . Chronic lower back pain     "on the left side"  . Anxiety   . Stress at home     "and at work"    Past Surgical History  Procedure Date  . Cesarean section 08/2008  . Cholecystectomy 03/02/2012    Procedure: LAPAROSCOPIC CHOLECYSTECTOMY;  Surgeon: Cherylynn Ridges, MD;  Location: [facility] OR;  Service: General;;    No family history on file.  History  Substance Use Topics  . Smoking status: Never Smoker   . Smokeless tobacco: Never Used  . Alcohol Use: Yes     "tried alcohol once when I was 40"    OB History    Grav Para Term Preterm Abortions TAB SAB Ect Mult Living                  Review of Systems  Constitutional: Negative.   Musculoskeletal: Negative for joint swelling.    Allergies  Mushroom extract complex  Home Medications   Current Outpatient Rx  Name Route Sig Dispense Refill  . DICLOFENAC POTASSIUM 50 MG PO TABS Oral Take 1 tablet (50 mg total) by mouth 3 (three) times daily. 30 tablet 0  . OXYCODONE-ACETAMINOPHEN PO Oral Take 5 mg by mouth as needed.      BP 132/92  Pulse 73  Temp 98.6 F (37 C) (Oral)  Resp 16  SpO2 97%  Physical Exam  Nursing note and vitals reviewed. Constitutional: She is oriented to  person, place, and time. She appears well-developed and well-nourished.  Musculoskeletal: She exhibits tenderness.       Hands: Neurological: She is alert and oriented to person, place, and time.  Skin: Skin is warm and dry.    ED Course  Procedures (including critical care time)  Labs Reviewed - No data to display Dg Finger Thumb Right  07/12/2012  *RADIOLOGY REPORT*  Clinical Data: Right thumb pain  RIGHT THUMB 2+V  Comparison: None.  Findings: No fracture or dislocation.  No aggressive osseous lesion.  No overt degenerative change.  IMPRESSION: No acute osseous abnormality of the right thumb. I   Original Report Authenticated By: Waneta Martins, M.D.      1. Thumb tendonitis       MDM  X-rays reviewed and report per radiologist.         Linna Hoff, MD 07/12/12 [phone]

## 2012-07-12 NOTE — ED Notes (Signed)
Right thumb pain x 1 week, recalls no injury

## 2013-02-20 ENCOUNTER — Encounter (HOSPITAL_COMMUNITY): Payer: Self-pay | Admitting: Emergency Medicine

## 2013-02-20 ENCOUNTER — Emergency Department (HOSPITAL_COMMUNITY)
Admission: EM | Admit: 2013-02-20 | Discharge: 2013-02-20 | Disposition: A | Discharge status: Home / Self Care | Payer: Medicaid Other | Attending: Emergency Medicine | Admitting: Emergency Medicine

## 2013-02-20 DIAGNOSIS — M545 Low back pain, unspecified: Secondary | ICD-10-CM | POA: Insufficient documentation

## 2013-02-20 DIAGNOSIS — J0300 Acute streptococcal tonsillitis, unspecified: Secondary | ICD-10-CM

## 2013-02-20 DIAGNOSIS — Z8659 Personal history of other mental and behavioral disorders: Secondary | ICD-10-CM | POA: Insufficient documentation

## 2013-02-20 DIAGNOSIS — G8929 Other chronic pain: Secondary | ICD-10-CM | POA: Insufficient documentation

## 2013-02-20 DIAGNOSIS — Z8679 Personal history of other diseases of the circulatory system: Secondary | ICD-10-CM | POA: Insufficient documentation

## 2013-02-20 DIAGNOSIS — J020 Streptococcal pharyngitis: Secondary | ICD-10-CM | POA: Insufficient documentation

## 2013-02-20 DIAGNOSIS — Z8739 Personal history of other diseases of the musculoskeletal system and connective tissue: Secondary | ICD-10-CM | POA: Insufficient documentation

## 2013-02-20 DIAGNOSIS — I10 Essential (primary) hypertension: Secondary | ICD-10-CM | POA: Insufficient documentation

## 2013-02-20 DIAGNOSIS — Z8701 Personal history of pneumonia (recurrent): Secondary | ICD-10-CM | POA: Insufficient documentation

## 2013-02-20 DIAGNOSIS — Z79899 Other long term (current) drug therapy: Secondary | ICD-10-CM | POA: Insufficient documentation

## 2013-02-20 DIAGNOSIS — Z862 Personal history of diseases of the blood and blood-forming organs and certain disorders involving the immune mechanism: Secondary | ICD-10-CM | POA: Insufficient documentation

## 2013-02-20 LAB — RAPID STREP SCREEN (MED CTR MEBANE ONLY): Streptococcus, Group A Screen (Direct): POSITIVE — AB

## 2013-02-20 MED ORDER — PENICILLIN V POTASSIUM 500 MG PO TABS: 500.0000 mg | ORAL_TABLET | Freq: Four times a day (QID) | ORAL | Status: AC

## 2013-02-20 MED ORDER — HYDROCODONE-ACETAMINOPHEN 5-325 MG PO TABS
1.0000 | ORAL_TABLET | Freq: Once | ORAL | Status: AC
  Administered 2013-02-20: 1 via ORAL
  Filled 2013-02-20: qty 1

## 2013-02-20 MED ORDER — IBUPROFEN 800 MG PO TABS
800.0000 mg | ORAL_TABLET | Freq: Once | ORAL | Status: AC
  Administered 2013-02-20: 800 mg via ORAL
  Filled 2013-02-20: qty 1

## 2013-02-20 MED ORDER — HYDROCODONE-ACETAMINOPHEN 5-325 MG PO TABS: 2.0000 | ORAL_TABLET | ORAL | Status: DC | PRN

## 2013-02-20 NOTE — ED Provider Notes (Signed)
History     CSN: [id number]  Arrival date & time 02/20/13  4098   First MD Initiated Contact with Patient 02/20/13 [phone]      Chief Complaint  Patient presents with  . Sore Throat    (Consider location/radiation/quality/duration/timing/severity/associated sxs/prior treatment) Patient is a 42 y.o. female presenting with pharyngitis. The history is provided by the patient. No language interpreter was used.  Sore Throat This is a new problem. The current episode started today. The problem occurs constantly. The problem has been gradually worsening. Associated symptoms include a sore throat. Nothing aggravates the symptoms. She has tried nothing for the symptoms. The treatment provided moderate relief.    Past Medical History  Diagnosis Date  . Anemia   . Hypertension   . Pneumonia 2006  . Migraines   . Headache(784.0) 02/29/12    "frequent"  . Arthritis     "left knee"  . Chronic lower back pain     "on the left side"  . Anxiety   . Stress at home     "and at work"    Past Surgical History  Procedure Laterality Date  . Cesarean section  08/2008  . Cholecystectomy  03/02/2012    Procedure: LAPAROSCOPIC CHOLECYSTECTOMY;  Surgeon: Cherylynn Ridges, MD;  Location: [facility] OR;  Service: General;;    History reviewed. No pertinent family history.  History  Substance Use Topics  . Smoking status: Never Smoker   . Smokeless tobacco: Never Used  . Alcohol Use: Yes     Comment: "tried alcohol once when I was 40"    OB History   Grav Para Term Preterm Abortions TAB SAB Ect Mult Living                  Review of Systems  HENT: Positive for sore throat.   All other systems reviewed and are negative.    Allergies  Mushroom extract complex  Home Medications   Current Outpatient Rx  Name  Route  Sig  Dispense  Refill  . ibuprofen (ADVIL,MOTRIN) 200 MG tablet   Oral   Take 400 mg by mouth daily as needed for pain.         Marland Kitchen Pheniramine-PE-APAP (THERAFLU FLU & SORE THROAT  PO)   Oral   Take 30 mLs by mouth every 4 (four) hours as needed (cold).         . sodium-potassium bicarbonate (ALKA-SELTZER GOLD) TBEF   Oral   Take 2 tablets by mouth 2 (two) times daily as needed (cold).           BP 102/74  Pulse 124  Temp(Src) 99.3 F (37.4 C) (Oral)  Resp 30  SpO2 99%  Physical Exam  Nursing note reviewed. Constitutional: She is oriented to person, place, and time. She appears well-developed and well-nourished.  HENT:  Head: Normocephalic and atraumatic.  Right Ear: External ear normal.  Left Ear: External ear normal.  Nose: Nose normal.  Swollen red tonsils, exudate,   Eyes: Conjunctivae are normal. Pupils are equal, round, and reactive to light.  Neck: Normal range of motion.  Cardiovascular: Normal rate.   Pulmonary/Chest: Effort normal.  Musculoskeletal: Normal range of motion.  Neurological: She is alert and oriented to person, place, and time. She has normal reflexes.  Skin: Skin is warm.  Psychiatric: She has a normal mood and affect.    ED Course  Procedures (including critical care time)  Labs Reviewed  RAPID STREP SCREEN - Abnormal; Notable for the  following:    Streptococcus, Group A Screen (Direct) POSITIVE (*)    All other components within normal limits   No results found.   No diagnosis found.    MDM  Strep positice       Elson Areas, PA-C 02/20/13 1011

## 2013-02-20 NOTE — ED Notes (Signed)
Pt c/o sore throat  and fever since Monday. Denies sob,cp. States she is unable to swallow and difficulty speaking.

## 2013-02-20 NOTE — ED Provider Notes (Signed)
Medical screening examination/treatment/procedure(s) were performed by non-physician practitioner and as supervising physician I was immediately available for consultation/collaboration.  Hurman Horn, MD 02/20/13 [phone]

## 2013-07-26 ENCOUNTER — Encounter (HOSPITAL_COMMUNITY): Payer: Self-pay | Admitting: Emergency Medicine

## 2013-07-26 ENCOUNTER — Emergency Department (HOSPITAL_COMMUNITY)
Admission: EM | Admit: 2013-07-26 | Discharge: 2013-07-26 | Disposition: A | Discharge status: Home / Self Care | Payer: Medicaid Other | Attending: Emergency Medicine | Admitting: Emergency Medicine

## 2013-07-26 DIAGNOSIS — N63 Unspecified lump in unspecified breast: Secondary | ICD-10-CM

## 2013-07-26 DIAGNOSIS — N61 Mastitis without abscess: Secondary | ICD-10-CM | POA: Insufficient documentation

## 2013-07-26 DIAGNOSIS — Z8659 Personal history of other mental and behavioral disorders: Secondary | ICD-10-CM | POA: Insufficient documentation

## 2013-07-26 DIAGNOSIS — Z8739 Personal history of other diseases of the musculoskeletal system and connective tissue: Secondary | ICD-10-CM | POA: Insufficient documentation

## 2013-07-26 DIAGNOSIS — N611 Abscess of the breast and nipple: Secondary | ICD-10-CM

## 2013-07-26 DIAGNOSIS — Z8701 Personal history of pneumonia (recurrent): Secondary | ICD-10-CM | POA: Insufficient documentation

## 2013-07-26 DIAGNOSIS — Z862 Personal history of diseases of the blood and blood-forming organs and certain disorders involving the immune mechanism: Secondary | ICD-10-CM | POA: Insufficient documentation

## 2013-07-26 DIAGNOSIS — I10 Essential (primary) hypertension: Secondary | ICD-10-CM | POA: Insufficient documentation

## 2013-07-26 DIAGNOSIS — Z8679 Personal history of other diseases of the circulatory system: Secondary | ICD-10-CM | POA: Insufficient documentation

## 2013-07-26 MED ORDER — HYDROCODONE-ACETAMINOPHEN 5-325 MG PO TABS: 1.0000 | ORAL_TABLET | ORAL | Status: DC | PRN

## 2013-07-26 MED ORDER — SULFAMETHOXAZOLE-TRIMETHOPRIM 800-160 MG PO TABS: 2.0000 | ORAL_TABLET | Freq: Two times a day (BID) | ORAL | Status: DC

## 2013-07-26 NOTE — ED Notes (Signed)
Patient presents with c/o pain to the left breast.  Feels a small knot that does move but is tender to touch

## 2013-07-26 NOTE — ED Provider Notes (Signed)
CSN: [id number]     Arrival date & time 07/26/13  1831 History  This chart was scribed for non-physician practitioner Trixie Dredge, PA, working with Ethelda Chick, MD by Ronal Fear, ED scribe. This patient was seen in room TR07C/TR07C and the patient's care was started at 7:45 PM.   Chief Complaint  Patient presents with  . Breast Pain    HPI HPI Comments: [name] [name] is a 42 y.o. female who presents to the Emergency Department complaining of a sudden onset lump in her left breast that was sore last night and the pain is worse with palpation and movement. The knot appeared this morning, the affected area is red. She has a clear discharge in her bilateral breast.  Her last mammogram was in 2009.  Pt did her last self exam 3 weeks ago without remarkable results. She drinks caffeine regularly. Pt denies fever, chills, body aches, wt gain or loss, night sweats, change in breast discharge, or discharge from the knot in her breast. Pt has no hx of breast complications. She denies any daily medications.    Past Medical History  Diagnosis Date  . Anemia   . Hypertension   . Pneumonia 2006  . Migraines   . Headache(784.0) 02/29/12    "frequent"  . Arthritis     "left knee"  . Chronic lower back pain     "on the left side"  . Anxiety   . Stress at home     "and at work"   Past Surgical History  Procedure Laterality Date  . Cesarean section  08/2008  . Cholecystectomy  03/02/2012    Procedure: LAPAROSCOPIC CHOLECYSTECTOMY;  Surgeon: Cherylynn Ridges, MD;  Location: [facility] OR;  Service: General;;  . Wisdom tooth extraction     Family History  Problem Relation Age of Onset  . Cancer Other    History  Substance Use Topics  . Smoking status: Never Smoker   . Smokeless tobacco: Never Used  . Alcohol Use: No     Comment: "tried alcohol once when I was 40"   OB History   Grav Para Term Preterm Abortions TAB SAB Ect Mult Living                 Review of Systems  Constitutional: Negative  for fever, chills, diaphoresis and unexpected weight change.  Gastrointestinal: Negative for nausea and vomiting.    Allergies  Mushroom extract complex  Home Medications  No current outpatient prescriptions on file. BP 128/87  Pulse 93  Temp(Src) 98.7 F (37.1 C) (Oral)  Resp 18  Ht 5\' 2"  (1.575 m)  Wt 257 lb 12.8 oz (116.937 kg)  BMI 47.14 kg/m2  SpO2 97% Physical Exam  Nursing note and vitals reviewed. Constitutional: She appears well-developed and well-nourished. No distress.  HENT:  Head: Normocephalic and atraumatic.  Neck: Neck supple.  Pulmonary/Chest: Effort normal.    Neurological: She is alert.  Skin: She is not diaphoretic.    ED Course  Procedures (including critical care time) DIAGNOSTIC STUDIES: Oxygen Saturation is 97% on RA, adequate by my interpretation.    COORDINATION OF CARE:    7:52 PM- Pt advised of plan for treatment pain med and abx with a referral to breast center and pt agrees.  Labs Review Labs Reviewed - No data to display Imaging Review No results found.  EKG Interpretation   None       MDM   1. Breast lump in female   2.  Left breast abscess    Pt with induration vs nodule of left breast with slight overlying erythema.  Developed overnight.  Likely early breast abscess.  Pt referred to breast center for further evaluation, imaging, and treatment.  D/C home with bactrim and norco.  Discussed  findings, treatment, and follow up  with patient.  Pt given return precautions.  Pt verbalizes understanding and agrees with plan.      I personally performed the services described in this documentation, which was scribed in my presence. The recorded information has been reviewed and is accurate.    Trixie Dredge, PA-C 07/27/13 0020

## 2013-07-26 NOTE — ED Notes (Signed)
Patient presents stating last night she noticed a lump in her left breast.   Stated that it moved around and it was painful.  The lump is about the size of a BB to the out aspect of the left.

## 2013-07-27 NOTE — Progress Notes (Signed)
Case manager spoke with Elpidio Anis PA - re-Bactrim prescription patient received on 11/ 1 . This Clinical research associate explained [facility] had called for verification of order from [facility]. Dr Hector Shade spoke With [facility] Inpatient pharmacist who confirmed Bactrim order of two tablets/ twice a day for ten days was correct.Clarified information with patient over the phone.She reports she will  Follow up with the breast clinic and thanked this Case Manager for calling today to provide a follow up call.

## 2013-07-27 NOTE — Progress Notes (Signed)
Received incoming call from [facility] .Pharmacist sandra asked if the Bactrim order in Epic could be read to her .Order from Dr Hyacinth Meeker from the AVS read to pharmacist.No further Case Manager  Needs.

## 2013-07-27 NOTE — ED Provider Notes (Signed)
Medical screening examination/treatment/procedure(s) were performed by non-physician practitioner and as supervising physician I was immediately available for consultation/collaboration.  EKG Interpretation   None        Diyan Dave K Linker, MD 07/27/13 1516 

## 2013-07-29 ENCOUNTER — Telehealth (HOSPITAL_COMMUNITY): Payer: Self-pay

## 2013-07-29 ENCOUNTER — Ambulatory Visit
Admission: RE | Admit: 2013-07-29 | Discharge: 2013-07-29 | Disposition: A | Discharge status: Home / Self Care | Payer: Medicaid Other | Source: Ambulatory Visit | Attending: Emergency Medicine | Admitting: Emergency Medicine

## 2013-07-29 ENCOUNTER — Other Ambulatory Visit: Payer: Self-pay | Admitting: Emergency Medicine

## 2013-07-29 ENCOUNTER — Other Ambulatory Visit (HOSPITAL_COMMUNITY): Payer: Self-pay | Admitting: Emergency Medicine

## 2013-07-29 ENCOUNTER — Other Ambulatory Visit: Payer: Self-pay | Admitting: Physician Assistant

## 2013-07-29 ENCOUNTER — Ambulatory Visit: Payer: Medicaid Other

## 2013-07-29 DIAGNOSIS — N611 Abscess of the breast and nipple: Secondary | ICD-10-CM

## 2013-07-29 DIAGNOSIS — N6320 Unspecified lump in the left breast, unspecified quadrant: Secondary | ICD-10-CM

## 2013-07-29 HISTORY — PX: BREAST BIOPSY: SHX20

## 2013-07-29 NOTE — ED Notes (Signed)
Pt called, having difficulty setting up referral from breast exam. I contacted breast center at [phone]. Information given. They will contact patient to set up appointment

## 2014-01-27 ENCOUNTER — Encounter (HOSPITAL_COMMUNITY): Payer: Self-pay | Admitting: Emergency Medicine

## 2014-01-27 ENCOUNTER — Emergency Department (INDEPENDENT_AMBULATORY_CARE_PROVIDER_SITE_OTHER): Payer: Medicaid Other

## 2014-01-27 ENCOUNTER — Emergency Department (HOSPITAL_COMMUNITY)
Admission: EM | Admit: 2014-01-27 | Discharge: 2014-01-27 | Disposition: A | Discharge status: Home / Self Care | Payer: Medicaid Other | Source: Home / Self Care | Attending: Family Medicine | Admitting: Family Medicine

## 2014-01-27 DIAGNOSIS — M5412 Radiculopathy, cervical region: Secondary | ICD-10-CM

## 2014-01-27 MED ORDER — METHYLPREDNISOLONE ACETATE 80 MG/ML IJ SUSP
INTRAMUSCULAR | Status: AC
  Filled 2014-01-27: qty 1

## 2014-01-27 MED ORDER — KETOROLAC TROMETHAMINE 60 MG/2ML IM SOLN
60.0000 mg | Freq: Once | INTRAMUSCULAR | Status: AC
  Administered 2014-01-27: 60 mg via INTRAMUSCULAR

## 2014-01-27 MED ORDER — ETODOLAC 500 MG PO TABS: 500.0000 mg | ORAL_TABLET | Freq: Two times a day (BID) | ORAL | Status: DC

## 2014-01-27 MED ORDER — KETOROLAC TROMETHAMINE 60 MG/2ML IM SOLN
INTRAMUSCULAR | Status: AC
  Filled 2014-01-27: qty 2

## 2014-01-27 MED ORDER — METHYLPREDNISOLONE ACETATE 40 MG/ML IJ SUSP
80.0000 mg | Freq: Once | INTRAMUSCULAR | Status: AC
  Administered 2014-01-27: 80 mg via INTRAMUSCULAR

## 2014-01-27 NOTE — ED Notes (Signed)
Pt  Reports  Pain l  Shoulder  Down  l  Arm     X  1  Month       - denys  Any  specefic  Injury            Reports        Some tingling in the  Affected  Arm  As  Well    - pt  Reports   The  Symptoms  Not  releived  By  Jonne PlyAsa    And  ibuprophen           Pt  Speaking  In  Complete   sentances

## 2014-01-27 NOTE — ED Provider Notes (Signed)
CSN: [id number]     Arrival date & time 01/27/14  [id number] History   First MD Initiated Contact with Patient 01/27/14 1003     Chief Complaint  Patient presents with  . Shoulder Pain   (Consider location/radiation/quality/duration/timing/severity/associated sxs/prior Treatment) HPI Comments: 43 year old female presents complaining of left shoulder and arm pain. This is been going on for one month. Initially it was intermittent and responded well to aspirin but came back couple days later. The pain has become more frequent and she is now taking Advil a couple of times a week when the pain gets bad. The pain seems to shoot across her shoulder and down her arm. She also occasionally has numbness in her hands. The pain is made worse by any movement of her left arm in by some movements of her head. It is worse at night. It is relieved by nothing except for Advil. She denies any specific injury.   Patient is a 43 y.o. female presenting with shoulder pain.  Shoulder Pain    Past Medical History  Diagnosis Date  . Anemia   . Hypertension   . Pneumonia 2006  . Migraines   . Headache(784.0) 02/29/12    "frequent"  . Arthritis     "left knee"  . Chronic lower back pain     "on the left side"  . Anxiety   . Stress at home     "and at work"   Past Surgical History  Procedure Laterality Date  . Cesarean section  08/2008  . Cholecystectomy  03/02/2012    Procedure: LAPAROSCOPIC CHOLECYSTECTOMY;  Surgeon: Cherylynn RidgesJames O Wyatt, MD;  [facility];  Service: General;;  . Wisdom tooth extraction     Family History  Problem Relation Age of Onset  . Cancer Other    History  Substance Use Topics  . Smoking status: Never Smoker   . Smokeless tobacco: Never Used  . Alcohol Use: No     Comment: "tried alcohol once when I was 40"   OB History   Grav Para Term Preterm Abortions TAB SAB Ect Mult Living                 Review of Systems  Musculoskeletal: Positive for arthralgias and myalgias.       See  history of present illness  Neurological: Positive for numbness.  All other systems reviewed and are negative.   Allergies  Mushroom extract complex  Home Medications   Prior to Admission medications   Medication Sig Start Date End Date Taking? Authorizing Provider  HYDROcodone-acetaminophen (NORCO/VICODIN) 5-325 MG per tablet Take 1 tablet by mouth every 4 (four) hours as needed for pain. 07/26/13   Trixie DredgeEmily West, PA-C  sulfamethoxazole-trimethoprim (BACTRIM DS,SEPTRA DS) 800-160 MG per tablet Take 2 tablets by mouth 2 (two) times daily. One po bid x 7 days 07/26/13   Trixie DredgeEmily West, PA-C   BP 148/86  Pulse 78  Temp(Src) 98.6 F (37 C) (Oral)  Resp 18  SpO2 100% Physical Exam  Nursing note and vitals reviewed. Constitutional: She is oriented to person, place, and time. Vital signs are normal. She appears well-developed and well-nourished. No distress.  Morbidly obese body habitus  HENT:  Head: Normocephalic and atraumatic.  Neck: Neck supple. Muscular tenderness (left sternocleidomastoid, palpation causes radiating pain down left arm) present. No spinous process tenderness present. Carotid bruit is not present. Decreased range of motion (pain down the left arm is exacerbated by flexion and left lateral rotation) present.  Pulmonary/Chest: Effort normal. No respiratory distress.  Musculoskeletal:       Left shoulder: Normal.  Range of motion exercises of left shoulder causes very mildly increased pain  Lymphadenopathy:    She has no cervical adenopathy.  Neurological: She is alert and oriented to person, place, and time. She has normal strength. No cranial nerve deficit or sensory deficit. She exhibits normal muscle tone. Coordination normal. GCS eye subscore is 4. GCS verbal subscore is 5. GCS motor subscore is 6.  Skin: Skin is warm and dry. No rash noted. She is not diaphoretic.  Psychiatric: She has a normal mood and affect. Judgment normal.    ED Course  Procedures (including  critical care time) Labs Review Labs Reviewed - No data to display  Imaging Review Dg Chest 2 View  01/27/2014   CLINICAL DATA:  Left shoulder pain x1 month  EXAM: CHEST  2 VIEW  COMPARISON:  None.  FINDINGS: Normal mediastinum and cardiac silhouette. Normal pulmonary vasculature. No evidence of effusion, infiltrate, or pneumothorax. No acute bony abnormality.  IMPRESSION: Normal chest radiograph.   Electronically Signed   By: Genevive BiStewart  Edmunds M.D.   On: 01/27/2014 11:17   Dg Cervical Spine Complete  01/27/2014   CLINICAL DATA:  Neck and shoulder pain.  EXAM: CERVICAL SPINE  4+ VIEWS  COMPARISON:  Cervical spine CT scan 05/09/2011.  FINDINGS: Normal and stable alignment of the cervical vertebral bodies. Disc spaces are maintained. No acute bony findings or abnormal prevertebral soft tissue swelling. The neural foramen are patent. The C1-2 articulations are maintained. The lung apices are clear.  IMPRESSION: Normal alignment and no acute bony findings.   Electronically Signed   By: Loralie ChampagneMark  Gallerani M.D.   On: 01/27/2014 11:28     MDM   1. Cervical radiculopathy    Giving Toradol and Depo-Medrol here and will discharge with a total lack. Advised to take on a schedule for 2 weeks. Followup with orthopedic if not improving.   Meds ordered this encounter  Medications  . ketorolac (TORADOL) injection 60 mg    Sig:   . methylPREDNISolone acetate (DEPO-MEDROL) injection 80 mg    Sig:   . etodolac (LODINE) 500 MG tablet    Sig: Take 1 tablet (500 mg total) by mouth 2 (two) times daily.    Dispense:  30 tablet    Refill:  0    Order Specific Question:  Supervising Provider    Answer:  Bradd CanaryKINDL, JAMES D [[id number]]       Graylon GoodZachary H Yavonne Kiss, PA-C 01/27/14 [phone]

## 2014-01-27 NOTE — Discharge Instructions (Signed)
Cervical Radiculopathy  Cervical radiculopathy happens when a nerve in the neck is pinched or bruised by a slipped (herniated) disk or by arthritic changes in the bones of the cervical spine. This can occur due to an injury or as part of the normal aging process. Pressure on the cervical nerves can cause pain or numbness that runs from your neck all the way down into your arm and fingers.  CAUSES   There are many possible causes, including:  · Injury.  · Muscle tightness in the neck from overuse.  · Swollen, painful joints (arthritis).  · Breakdown or degeneration in the bones and joints of the spine (spondylosis) due to aging.  · Bone spurs that may develop near the cervical nerves.  SYMPTOMS   Symptoms include pain, weakness, or numbness in the affected arm and hand. Pain can be severe or irritating. Symptoms may be worse when extending or turning the neck.  DIAGNOSIS   Your caregiver will ask about your symptoms and do a physical exam. He or she may test your strength and reflexes. X-rays, CT scans, and MRI scans may be needed in cases of injury or if the symptoms do not go away after a period of time. Electromyography (EMG) or nerve conduction testing may be done to study how your nerves and muscles are working.  TREATMENT   Your caregiver may recommend certain exercises to help relieve your symptoms. Cervical radiculopathy can, and often does, get better with time and treatment. If your problems continue, treatment options may include:  · Wearing a soft collar for short periods of time.  · Physical therapy to strengthen the neck muscles.  · Medicines, such as nonsteroidal anti-inflammatory drugs (NSAIDs), oral corticosteroids, or spinal injections.  · Surgery. Different types of surgery may be done depending on the cause of your problems.  HOME CARE INSTRUCTIONS   · Put ice on the affected area.  · Put ice in a plastic bag.  · Place a towel between your skin and the bag.  · Leave the ice on for 15-20 minutes,  03-04 times a day or as directed by your caregiver.  · If ice does not help, you can try using heat. Take a warm shower or bath, or use a hot water bottle as directed by your caregiver.  · You may try a gentle neck and shoulder massage.  · Use a flat pillow when you sleep.  · Only take over-the-counter or prescription medicines for pain, discomfort, or fever as directed by your caregiver.  · If physical therapy was prescribed, follow your caregiver's directions.  · If a soft collar was prescribed, use it as directed.  SEEK IMMEDIATE MEDICAL CARE IF:   · Your pain gets much worse and cannot be controlled with medicines.  · You have weakness or numbness in your hand, arm, face, or leg.  · You have a high fever or a stiff, rigid neck.  · You lose bowel or bladder control (incontinence).  · You have trouble with walking, balance, or speaking.  MAKE SURE YOU:   · Understand these instructions.  · Will watch your condition.  · Will get help right away if you are not doing well or get worse.  Document Released: 06/06/2001 Document Revised: 12/04/2011 Document Reviewed: 04/25/2011  [employer]® Patient Information ©2014 [employer].

## 2014-01-27 NOTE — ED Provider Notes (Signed)
Medical screening examination/treatment/procedure(s) were performed by resident physician or non-physician practitioner and as supervising physician I was immediately available for consultation/collaboration.   KINDL,JAMES DOUGLAS MD.   James D Kindl, MD 01/27/14 1633 

## 2014-04-07 ENCOUNTER — Emergency Department (HOSPITAL_COMMUNITY)
Admission: EM | Admit: 2014-04-07 | Discharge: 2014-04-07 | Disposition: A | Discharge status: Home / Self Care | Payer: Medicaid Other | Source: Home / Self Care

## 2014-04-07 ENCOUNTER — Encounter (HOSPITAL_COMMUNITY): Payer: Self-pay | Admitting: Emergency Medicine

## 2014-04-07 DIAGNOSIS — H109 Unspecified conjunctivitis: Secondary | ICD-10-CM

## 2014-04-07 MED ORDER — KETOTIFEN FUMARATE 0.025 % OP SOLN: 1.0000 [drp] | Freq: Two times a day (BID) | OPHTHALMIC | Status: DC

## 2014-04-07 MED ORDER — ERYTHROMYCIN 5 MG/GM OP OINT: TOPICAL_OINTMENT | OPHTHALMIC | Status: DC

## 2014-04-07 MED ORDER — TETRACAINE HCL 0.5 % OP SOLN
OPHTHALMIC | Status: AC
  Filled 2014-04-07: qty 2

## 2014-04-07 NOTE — ED Provider Notes (Signed)
CSN: [id number]     Arrival date & time 04/07/14  1426 History   First MD Initiated Contact with Patient 04/07/14 1510     Chief Complaint  Patient presents with  . Conjunctivitis   (Consider location/radiation/quality/duration/timing/severity/associated sxs/prior Treatment) HPI Comments: Left eye itchy for 2 d. Clear to green mucoid d/c. Eye lid swelling, redness.   Past Medical History  Diagnosis Date  . Anemia   . Hypertension   . Pneumonia 2006  . Migraines   . Headache(784.0) 02/29/12    "frequent"  . Arthritis     "left knee"  . Chronic lower back pain     "on the left side"  . Anxiety   . Stress at home     "and at work"   Past Surgical History  Procedure Laterality Date  . Cesarean section  08/2008  . Cholecystectomy  03/02/2012    Procedure: LAPAROSCOPIC CHOLECYSTECTOMY;  Surgeon: Cherylynn RidgesJames O Wyatt, MD;  [facility];  Service: General;;  . Wisdom tooth extraction     Family History  Problem Relation Age of Onset  . Cancer Other    History  Substance Use Topics  . Smoking status: Never Smoker   . Smokeless tobacco: Never Used  . Alcohol Use: No     Comment: "tried alcohol once when I was 40"   OB History   Grav Para Term Preterm Abortions TAB SAB Ect Mult Living                 Review of Systems  Constitutional: Negative.   HENT: Negative.   Eyes: Positive for pain, discharge, redness and itching. Negative for visual disturbance.  All other systems reviewed and are negative.   Allergies  Mushroom extract complex  Home Medications   Prior to Admission medications   Medication Sig Start Date End Date Taking? Authorizing Provider  erythromycin ophthalmic ointment Place a 1/2 inch ribbon of ointment into the left lower eyelid qid 04/07/14   Hayden Rasmussenavid Kenson Groh, NP  etodolac (LODINE) 500 MG tablet Take 1 tablet (500 mg total) by mouth 2 (two) times daily. 01/27/14   Graylon GoodZachary H Baker, PA-C  HYDROcodone-acetaminophen (NORCO/VICODIN) 5-325 MG per tablet Take 1 tablet  by mouth every 4 (four) hours as needed for pain. 07/26/13   Trixie DredgeEmily West, PA-C  ketotifen (ZADITOR) 0.025 % ophthalmic solution Place 1 drop into the left eye 2 (two) times daily. 04/07/14   Hayden Rasmussenavid Raeden Schippers, NP  sulfamethoxazole-trimethoprim (BACTRIM DS,SEPTRA DS) 800-160 MG per tablet Take 2 tablets by mouth 2 (two) times daily. One po bid x 7 days 07/26/13   Trixie DredgeEmily West, PA-C   BP 126/79  Pulse 69  Temp(Src) 98.3 F (36.8 C) (Oral)  Resp 18  SpO2 100% Physical Exam  Nursing note and vitals reviewed. Constitutional: She is oriented to person, place, and time. She appears well-developed and well-nourished. No distress.  Eyes: EOM are normal. Pupils are equal, round, and reactive to light.  Upper and lower left lids red with swelling. Scleral injection. No limbal flush. Anterior chamber clear.   Neck: Normal range of motion. Neck supple.  Cardiovascular: Normal rate.   Pulmonary/Chest: Effort normal. No respiratory distress.  Neurological: She is alert and oriented to person, place, and time.  Skin: Skin is warm and dry.  Psychiatric: She has a normal mood and affect.    ED Course  Procedures (including critical care time) Labs Review Labs Reviewed - No data to display  Imaging Review No results found.  MDM   1. Conjunctivitis, left eye     zaditor and erythromycin oint Warm compresses    Hayden Rasmussen, NP 04/07/14 1545

## 2014-04-07 NOTE — Discharge Instructions (Signed)

## 2014-04-07 NOTE — ED Notes (Signed)
C/o left pink eye States eye is itchy, swollen, painful and has watery discharge States eye lid was crusty this morning with green mucous

## 2014-04-08 NOTE — ED Provider Notes (Signed)
Medical screening examination/treatment/procedure(s) were performed by resident physician or non-physician practitioner and as supervising physician I was immediately available for consultation/collaboration.   Angenette Daily DOUGLAS MD.   Edward Guthmiller D Martavion Couper, MD 04/08/14 1419 

## 2014-09-07 ENCOUNTER — Ambulatory Visit (INDEPENDENT_AMBULATORY_CARE_PROVIDER_SITE_OTHER): Payer: Medicaid Other | Admitting: Internal Medicine

## 2014-09-07 ENCOUNTER — Encounter: Payer: Self-pay | Admitting: Internal Medicine

## 2014-09-07 ENCOUNTER — Ambulatory Visit: Payer: Medicaid Other | Admitting: *Deleted

## 2014-09-07 VITALS — BP 132/68 | HR 70 | Temp 98.3°F | Ht 62.0 in | Wt 267.1 lb

## 2014-09-07 DIAGNOSIS — J309 Allergic rhinitis, unspecified: Secondary | ICD-10-CM | POA: Insufficient documentation

## 2014-09-07 DIAGNOSIS — Z0000 Encounter for general adult medical examination without abnormal findings: Secondary | ICD-10-CM | POA: Insufficient documentation

## 2014-09-07 DIAGNOSIS — Z6841 Body Mass Index (BMI) 40.0 and over, adult: Secondary | ICD-10-CM

## 2014-09-07 DIAGNOSIS — M533 Sacrococcygeal disorders, not elsewhere classified: Secondary | ICD-10-CM

## 2014-09-07 DIAGNOSIS — D509 Iron deficiency anemia, unspecified: Secondary | ICD-10-CM

## 2014-09-07 DIAGNOSIS — Z23 Encounter for immunization: Secondary | ICD-10-CM

## 2014-09-07 DIAGNOSIS — J3089 Other allergic rhinitis: Secondary | ICD-10-CM | POA: Insufficient documentation

## 2014-09-07 LAB — CBC
HCT: 39.6 % (ref 36.0–46.0)
HEMOGLOBIN: 12.3 g/dL (ref 12.0–15.0)
MCH: 24.9 pg — ABNORMAL LOW (ref 26.0–34.0)
MCHC: 31.1 g/dL (ref 30.0–36.0)
MCV: 80.2 fL (ref 78.0–100.0)
MPV: 9.8 fL (ref 9.4–12.4)
Platelets: 407 10*3/uL — ABNORMAL HIGH (ref 150–400)
RBC: 4.94 MIL/uL (ref 3.87–5.11)
RDW: 15.6 % — ABNORMAL HIGH (ref 11.5–15.5)
WBC: 5.7 10*3/uL (ref 4.0–10.5)

## 2014-09-07 LAB — GLUCOSE, CAPILLARY: GLUCOSE-CAPILLARY: 104 mg/dL — AB (ref 70–99)

## 2014-09-07 LAB — TSH: TSH: 0.714 u[IU]/mL (ref 0.350–4.500)

## 2014-09-07 LAB — POCT GLYCOSYLATED HEMOGLOBIN (HGB A1C): HEMOGLOBIN A1C: 5.8

## 2014-09-07 MED ORDER — NAPROXEN 500 MG PO TABS: 500.0000 mg | ORAL_TABLET | Freq: Two times a day (BID) | ORAL | Status: DC

## 2014-09-07 MED ORDER — FLUTICASONE PROPIONATE 50 MCG/ACT NA SUSP: 2.0000 | Freq: Every day | NASAL | Status: DC

## 2014-09-07 NOTE — Assessment & Plan Note (Signed)
-  Flu shot -CBC, TSH, Hemoglobin A1c for first visit -Pap smear at next visit -Follow up mammogram

## 2014-09-07 NOTE — Progress Notes (Signed)
   Subjective:    Patient ID: [name] [name], female    DOB: 03-18-1971, 43 y.o.   MRN: [id number]  HPI [name] [name] is a 43 yo female with PMHx of morbid obesity, iron-deficiency anemia who presents for complaint of hip pain. Please see problem oriented charting for further details.  Review of Systems General: Admits to fatigue (chronic). Denies fever, chills, change in appetite and diaphoresis.  Respiratory: Admits to cough (chronic). Denies SOB, DOE, chest tightness, and wheezing.   Cardiovascular: Denies chest pain and palpitations.  Gastrointestinal: Denies diarrhea, constipation, blood in stool. Endocrine: Denies hot or cold intolerance, polyuria, and polydipsia. Musculoskeletal: Admits to left hip pain with radiation into bilateral knees. Denies myalgias, joint swelling and gait problem.  Skin: Denies rash.  Neurological: Denies dizziness, headaches, weakness, lightheadedness. Psychiatric/Behavioral: Denies mood changes, confusion, nervousness, sleep disturbance and agitation.  Past Medical History  Diagnosis Date  . Anemia   . Hypertension   . Pneumonia 2006  . Migraines   . Headache(784.0) 02/29/12    "frequent"  . Arthritis     "left knee"  . Chronic lower back pain     "on the left side"  . Anxiety   . Stress at home     "and at work"   Current Outpatient Prescriptions on File Prior to Visit  Medication Sig Dispense Refill  . erythromycin ophthalmic ointment Place a 1/2 inch ribbon of ointment into the left lower eyelid qid 1 g 0  . etodolac (LODINE) 500 MG tablet Take 1 tablet (500 mg total) by mouth 2 (two) times daily. 30 tablet 0  . HYDROcodone-acetaminophen (NORCO/VICODIN) 5-325 MG per tablet Take 1 tablet by mouth every 4 (four) hours as needed for pain. 15 tablet 0  . ketotifen (ZADITOR) 0.025 % ophthalmic solution Place 1 drop into the left eye 2 (two) times daily. 5 mL 0  . sulfamethoxazole-trimethoprim (BACTRIM DS,SEPTRA DS) 800-160 MG per tablet Take 2  tablets by mouth 2 (two) times daily. One po bid x 7 days 28 tablet 0   No current facility-administered medications on file prior to visit.      Objective:   Physical Exam Filed Vitals:   09/07/14 0853  BP: 132/68  Pulse: 70  Temp: 98.3 F (36.8 C)  TempSrc: Oral  Height: 5\' 2"  (1.575 m)  Weight: 267 lb 1.6 oz (121.156 kg)  SpO2: 99%   General: Vital signs reviewed.  Patient is well-developed and well-nourished, in no acute distress and cooperative with exam.  HEENT: Normal TM bilaterally. Erythematous, edematous nasal turbinates, normal oropharynx without exudate.  Cardiovascular: RRR, S1 normal, S2 normal, no murmurs, gallops, or rubs. Pulmonary/Chest: Clear to auscultation bilaterally, no wheezes, rales, or rhonchi. Abdominal: Soft, obese, non-tender, non-distended, BS + Musculoskeletal: Tenderness on palpation of left SI joint. No tenderness of lateral or anterior hips bilaterally. Normal sensation. Normal range of motion. Negative SLR bilaterally.  Extremities: No lower extremity edema bilaterally Skin: Warm, dry and intact. No rashes or erythema. Psychiatric: Normal mood and affect. speech and behavior is normal. Cognition and memory are normal.     Assessment & Plan:   Please see problem based assessment and plan.

## 2014-09-07 NOTE — Assessment & Plan Note (Signed)
Assessment: Patient presents with complaint of chronic cough for 3-4 years that occurs at night. She admits to nasal congestion with a post-nasal drip causing her cough and throat irritation. She has tried cough drops and drinking water without relief. She denies tobacco use. She is not currently on any medications. She denies fever, chills, facial pain, headache, sore throat, shortness of breath or wheezing. Physical exam shows she is afebrile, pulse ox 99% on room air. Erythematous, edematous nasal turbinates, normal oropharynx, no lymphadenopathy, lungs CTA b/l.   Plan: -Flonase 2 sprays each nostril BID -Re-evaluate in 1 month -If not improved, consider different medication

## 2014-09-07 NOTE — Patient Instructions (Signed)
General Instructions:  Please bring your medicines with you each time you come to clinic.  Medicines may include prescription medications, over-the-counter medications, herbal remedies, eye drops, vitamins, or other pills.  FOR YOUR HIP PAIN: -Naproxen 500 mg twice a day with meals -Referral to physical therapy -Return in one month for recheck  FOR YOUR COUGH: -Likely allergic rhinitis -Flonase nasal spray 2 sprays each nose every day  For routine health maintenance: -Flu shot today -CBC (for anemia check) -TSH ( for thyroid check) -HgbA1c (for diabetes check) -schedule mammogram -pap smear at follow up visit in one month  Sacroiliac Joint Dysfunction The sacroiliac joint connects the lower part of the spine (the sacrum) with the bones of the pelvis. CAUSES  Sometimes, there is no obvious reason for sacroiliac joint dysfunction. Other times, it may occur   During pregnancy.  After injury, such as:  Car accidents.  Sport-related injuries.  Work-related injuries.  Due to one leg being shorter than the other.  Due to other conditions that affect the joints, such as:  Rheumatoid arthritis.  Gout.  Psoriasis.  Joint infection (septic arthritis). SYMPTOMS  Symptoms may include:  Pain in the:  Lower back.  Buttocks.  Groin.  Thighs and legs.  Difficult sitting, standing, walking, lying, bending or lifting. DIAGNOSIS  A number of tests may be used to help diagnose the cause of sacroiliac joint dysfunction, including:  Imaging tests to look for other causes of pain, including:  MRI.  CT scan.  Bone scan.  Diagnostic injection: During a special x-ray (called fluoroscopy), a needle is put into the sacroiliac joint. A numbing medicine is injected into the joint. If the pain is improved or stopped, the diagnosis of sacroiliac joint dysfunction is more likely. TREATMENT  There are a number of types of treatment used for sacroiliac joint dysfunction,  including:  Only take over-the-counter or prescription medicines for pain, discomfort, or fever as directed by your caregiver.  Medications to relax muscles.  Rest. Decreasing activity can help cut down on painful muscle spasms and allow the back to heal.  Application of heat or ice to the lower back may improve muscle spasms and soothe pain.  Brace. A special back brace, called a sacroiliac belt, can help support the joint while your back is healing.  Physical therapy can help teach comfortable positions and exercises to strengthen muscles that support the sacroiliac joint.  Cortisone injections. Injections of steroid medicine into the joint can help decrease swelling and improve pain.  Hyaluronic acid injections. This chemical improves lubrication within the sacroiliac joint, thereby decreasing pain.  Radiofrequency ablation. A special needle is placed into the joint, where it burns away nerves that are carrying pain messages from the joint.  Surgery. Because pain occurs during movement of the joint, screws and plates may be installed in order to limit or prevent joint motion. HOME CARE INSTRUCTIONS   Take all medications exactly as directed.  Follow instructions regarding both rest and physical activity, to avoid worsening the pain.  Do physical therapy exercises exactly as prescribed. SEEK IMMEDIATE MEDICAL CARE IF:  You experience increasingly severe pain.  You develop new symptoms, such as numbness or tingling in your legs or feet.  You lose bladder or bowel control. Document Released: 12/08/2008 Document Revised: 12/04/2011 Document Reviewed: 12/08/2008 [facility] Patient Information 2015 [place], [place]. This information is not intended to replace advice given to you by your health care provider. Make sure you discuss any questions you have with your health  care provider.  Allergic Rhinitis Allergic rhinitis is when the mucous membranes in the nose respond to allergens.  Allergens are particles in the air that cause your body to have an allergic reaction. This causes you to release allergic antibodies. Through a chain of events, these eventually cause you to release histamine into the blood stream. Although meant to protect the body, it is this release of histamine that causes your discomfort, such as frequent sneezing, congestion, and an itchy, runny nose.  CAUSES  Seasonal allergic rhinitis (hay fever) is caused by pollen allergens that may come from grasses, trees, and weeds. Year-round allergic rhinitis (perennial allergic rhinitis) is caused by allergens such as house dust mites, pet dander, and mold spores.  SYMPTOMS   Nasal stuffiness (congestion).  Itchy, runny nose with sneezing and tearing of the eyes. DIAGNOSIS  Your health care provider can help you determine the allergen or allergens that trigger your symptoms. If you and your health care provider are unable to determine the allergen, skin or blood testing may be used. TREATMENT  Allergic rhinitis does not have a cure, but it can be controlled by:  Medicines and allergy shots (immunotherapy).  Avoiding the allergen. Hay fever may often be treated with antihistamines in pill or nasal spray forms. Antihistamines block the effects of histamine. There are over-the-counter medicines that may help with nasal congestion and swelling around the eyes. Check with your health care provider before taking or giving this medicine.  If avoiding the allergen or the medicine prescribed do not work, there are many new medicines your health care provider can prescribe. Stronger medicine may be used if initial measures are ineffective. Desensitizing injections can be used if medicine and avoidance does not work. Desensitization is when a patient is given ongoing shots until the body becomes less sensitive to the allergen. Make sure you follow up with your health care provider if problems continue. HOME CARE  INSTRUCTIONS It is not possible to completely avoid allergens, but you can reduce your symptoms by taking steps to limit your exposure to them. It helps to know exactly what you are allergic to so that you can avoid your specific triggers. SEEK MEDICAL CARE IF:   You have a fever.  You develop a cough that does not stop easily (persistent).  You have shortness of breath.  You start wheezing.  Symptoms interfere with normal daily activities. Document Released: 06/06/2001 Document Revised: 09/16/2013 Document Reviewed: 05/19/2013 [facility] Patient Information 2015 [place], [place]. This information is not intended to replace advice given to you by your health care provider. Make sure you discuss any questions you have with your health care provider.   Self Care Goals & Plans:  Self Care Goal 09/07/2014  Manage my medications take my medicines as prescribed  Eat healthy foods drink diet soda or water instead of juice or soda; eat more vegetables; eat foods that are low in salt; eat baked foods instead of fried foods; eat fruit for snacks and desserts
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
## 2014-09-07 NOTE — Assessment & Plan Note (Signed)
Assessment: BMI of 48.83.  Plan: -Discussed weight loss and healthy eating -Revisit at follow up visit

## 2014-09-07 NOTE — Assessment & Plan Note (Signed)
Assessment: Hemoglobin in 2013 11.3 with MCV of 77.3. Patient states she has a history of iron-deficiency anemia, yet there are no iron studies in EPIC nor has the patient been on iron supplementation. She admits to fatigue.   Plan: -CBC  -If microcytic and hemoglobin low, consider iron panels -Consider iron supplementation

## 2014-09-08 NOTE — Progress Notes (Signed)
Internal Medicine Clinic Attending  I saw and evaluated the patient.  I personally confirmed the key portions of the history and exam documented by Dr. Richardson and I reviewed pertinent patient test results.  The assessment, diagnosis, and plan were formulated together and I agree with the documentation in the resident's note. 

## 2014-09-15 ENCOUNTER — Ambulatory Visit: Payer: Medicaid Other | Attending: Internal Medicine | Admitting: Physical Therapy

## 2014-09-15 ENCOUNTER — Encounter: Payer: Self-pay | Admitting: Physical Therapy

## 2014-09-15 ENCOUNTER — Telehealth: Payer: Self-pay | Admitting: *Deleted

## 2014-09-15 DIAGNOSIS — M5441 Lumbago with sciatica, right side: Secondary | ICD-10-CM | POA: Insufficient documentation

## 2014-09-15 NOTE — Patient Instructions (Signed)
Elbow Prop (Extension)   Prop body up on elbows for __10-30__ seconds. Slowly lower it. Repeat ___2-3_ times. Do _2___ sessions per day.  http://gt2.exer.us/243      ll rights reserved.  Backward Bend (Standing)   Arch backward to make hollow of back deeper. Hold __10__ seconds. Repeat __3-5__ times per set. Do ____1 sets per session. Do __1-2__ sessions per day.  http://orth.exer.us/178   Copyright  VHI. All rights reserved.  Posture Tips DO: - stand tall and erect - keep chin tucked in - keep head and shoulders in alignment - check posture regularly in mirror or large window - pull head back against headrest in car seat;  Change your position often.  Sit with lumbar support. DON'T: - slouch or slump while watching TV or reading - sit, stand or lie in one position  for too long;  Sitting is especially hard on the spine so if you sit at a desk/use the computer, then stand up often!   Copyright  VHI. All rights reserved.  Posture - Standing   Good posture is important. Avoid slouching and forward head thrust. Maintain curve in low back and align ears over shoul- ders, hips over ankles.  Pull your belly button in toward your back bone.   Copyright  VHI. All rights reserved.  Posture - Sitting   Sit upright, head facing forward. Try using a roll to support lower back. Keep shoulders relaxed, and avoid rounded back. Keep hips level with knees. Avoid crossing legs for long periods.   Copyright  VHI. All rights reserved.  Lie with lower legs propped on couch.   Try ICE PACK

## 2014-09-15 NOTE — Telephone Encounter (Signed)
Pt is going to apply for some assistance and call back to schedule her appts...td
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
## 2014-10-15 ENCOUNTER — Encounter: Payer: Medicaid Other | Admitting: Internal Medicine

## 2014-11-03 ENCOUNTER — Ambulatory Visit (INDEPENDENT_AMBULATORY_CARE_PROVIDER_SITE_OTHER): Payer: Medicaid Other | Admitting: Internal Medicine

## 2014-11-03 ENCOUNTER — Other Ambulatory Visit (HOSPITAL_COMMUNITY)
Admission: RE | Admit: 2014-11-03 | Discharge: 2014-11-03 | Disposition: A | Discharge status: Home / Self Care | Payer: Medicaid Other | Source: Ambulatory Visit | Attending: Internal Medicine | Admitting: Internal Medicine

## 2014-11-03 ENCOUNTER — Ambulatory Visit: Payer: Medicaid Other | Admitting: Internal Medicine

## 2014-11-03 ENCOUNTER — Encounter: Payer: Medicaid Other | Admitting: Internal Medicine

## 2014-11-03 ENCOUNTER — Encounter: Payer: Self-pay | Admitting: Internal Medicine

## 2014-11-03 VITALS — BP 135/73 | HR 80 | Temp 98.2°F | Wt 269.4 lb

## 2014-11-03 DIAGNOSIS — Z01419 Encounter for gynecological examination (general) (routine) without abnormal findings: Secondary | ICD-10-CM

## 2014-11-03 DIAGNOSIS — D509 Iron deficiency anemia, unspecified: Secondary | ICD-10-CM

## 2014-11-03 DIAGNOSIS — B372 Candidiasis of skin and nail: Secondary | ICD-10-CM

## 2014-11-03 DIAGNOSIS — M5416 Radiculopathy, lumbar region: Secondary | ICD-10-CM

## 2014-11-03 DIAGNOSIS — M533 Sacrococcygeal disorders, not elsewhere classified: Secondary | ICD-10-CM

## 2014-11-03 DIAGNOSIS — Z1151 Encounter for screening for human papillomavirus (HPV): Secondary | ICD-10-CM | POA: Insufficient documentation

## 2014-11-03 DIAGNOSIS — L304 Erythema intertrigo: Secondary | ICD-10-CM

## 2014-11-03 DIAGNOSIS — Z0000 Encounter for general adult medical examination without abnormal findings: Secondary | ICD-10-CM

## 2014-11-03 MED ORDER — GABAPENTIN 300 MG PO CAPS: 300.0000 mg | ORAL_CAPSULE | Freq: Three times a day (TID) | ORAL | Status: DC

## 2014-11-03 MED ORDER — MICONAZOLE NITRATE 2 % EX CREA: 1.0000 "application " | TOPICAL_CREAM | Freq: Two times a day (BID) | CUTANEOUS | Status: DC

## 2014-11-03 MED ORDER — NAPROXEN 500 MG PO TABS: 500.0000 mg | ORAL_TABLET | Freq: Two times a day (BID) | ORAL | Status: DC

## 2014-11-03 NOTE — Patient Instructions (Signed)
General Instructions:   Thank you for bringing your medicines today. This helps [place] keep you safe from mistakes.   FOR YOUR INFECTION: -USE MICONAZOLE CREAM 2% TO THE AREA TWICE A DAY. KEEP THE AREA CLEAN AND DRY  FOR YOUR BACK PAIN: -USE NAPROXEN AS NEEDED -USE GABAPENTIN AT NIGHTTIME, MAY MAKE YOU DROWSY -CONTINUE EXERCISES AND WEIGHT LOSS  Calorie Counting for Weight Loss Calories are energy you get from the things you eat and drink. Your body uses this energy to keep you going throughout the day. The number of calories you eat affects your weight. When you eat more calories than your body needs, your body stores the extra calories as fat. When you eat fewer calories than your body needs, your body [name] [name] fat to get the energy it needs. Calorie counting means keeping track of how many calories you eat and drink each day. If you make sure to eat fewer calories than your body needs, you should lose weight. In order for calorie counting to work, you will need to eat the number of calories that are right for you in a day to lose a healthy amount of weight per week. A healthy amount of weight to lose per week is usually 1-2 lb (0.5-0.9 kg). A dietitian can determine how many calories you need in a day and give you suggestions on how to reach your calorie goal.  WHAT IS MY MY PLAN? My goal is to have __________ calories per day.  If I have this many calories per day, I should lose around __________ pounds per week. WHAT DO I NEED TO KNOW ABOUT CALORIE COUNTING? In order to meet your daily calorie goal, you will need to:  Find out how many calories are in each food you would like to eat. Try to do this before you eat.  Decide how much of the food you can eat.  Write down what you ate and how many calories it had. Doing this is called keeping a food log. WHERE DO I FIND CALORIE INFORMATION? The number of calories in a food can be found on a Nutrition Facts label. Note that all the information  on a label is based on a specific serving of the food. If a food does not have a Nutrition Facts label, try to look up the calories online or ask your dietitian for help. HOW DO I DECIDE HOW MUCH TO EAT? To decide how much of the food you can eat, you will need to consider both the number of calories in one serving and the size of one serving. This information can be found on the Nutrition Facts label. If a food does not have a Nutrition Facts label, look up the information online or ask your dietitian for help. Remember that calories are listed per serving. If you choose to have more than one serving of a food, you will have to multiply the calories per serving by the amount of servings you plan to eat. For example, the label on a package of bread might say that a serving size is 1 slice and that there are 90 calories in a serving. If you eat 1 slice, you will have eaten 90 calories. If you eat 2 slices, you will have eaten 180 calories. HOW DO I KEEP A FOOD LOG? After each meal, record the following information in your food log:  What you ate.  How much of it you ate.  How many calories it had.  Then, add up your  calories. Keep your food log near you, such as in a small notebook in your pocket. Another option is to use a mobile app or website. Some programs will calculate calories for you and show you how many calories you have left each time you add an item to the log. WHAT ARE SOME CALORIE COUNTING TIPS?  Use your calories on foods and drinks that will fill you up and not leave you hungry. Some examples of this include foods like nuts and nut butters, vegetables, lean proteins, and high-fiber foods (more than 5 g fiber per serving).  Eat nutritious foods and avoid empty calories. Empty calories are calories you get from foods or beverages that do not have many nutrients, such as candy and soda. It is better to have a nutritious high-calorie food (such as an avocado) than a food with few  nutrients (such as a bag of chips).  Know how many calories are in the foods you eat most often. This way, you do not have to look up how many calories they have each time you eat them.  Look out for foods that may seem like low-calorie foods but are really high-calorie foods, such as baked goods, soda, and fat-free candy.  Pay attention to calories in drinks. Drinks such as sodas, specialty coffee drinks, alcohol, and juices have a lot of calories yet do not fill you up. Choose low-calorie drinks like water and diet drinks.  Focus your calorie counting efforts on higher calorie items. Logging the calories in a garden salad that contains only vegetables is less important than calculating the calories in a milk shake.  Find a way of tracking calories that works for you. Get creative. Most people who are successful find ways to keep track of how much they eat in a day, even if they do not count every calorie. WHAT ARE SOME PORTION CONTROL TIPS?  Know how many calories are in a serving. This will help you know how many servings of a certain food you can have.  Use a measuring cup to measure serving sizes. This is helpful when you start out. With time, you will be able to estimate serving sizes for some foods.  Take some time to put servings of different foods on your favorite plates, bowls, and cups so you know what a serving looks like.  Try not to eat straight from a bag or box. Doing this can lead to overeating. Put the amount you would like to eat in a cup or on a plate to make sure you are eating the right portion.  Use smaller plates, glasses, and bowls to prevent overeating. This is a quick and easy way to practice portion control. If your plate is smaller, less food can fit on it.  Try not to multitask while eating, such as watching TV or using your computer. If it is time to eat, sit down at a table and enjoy your food. Doing this will help you to start recognizing when you are full. It  will also make you more aware of what and how much you are eating. HOW CAN I CALORIE COUNT WHEN EATING OUT?  Ask for smaller portion sizes or child-sized portions.  Consider sharing an entree and sides instead of getting your own entree.  If you get your own entree, eat only half. Ask for a box at the beginning of your meal and put the rest of your entree in it so you are not tempted to eat it.  Look for the calories on the menu. If calories are listed, choose the lower calorie options.  Choose dishes that include vegetables, fruits, whole grains, low-fat dairy products, and lean protein. Focusing on smart food choices from each of the 5 food groups can help you stay on track at restaurants.  Choose items that are boiled, broiled, grilled, or steamed.  Choose water, milk, unsweetened iced tea, or other drinks without added sugars. If you want an alcoholic beverage, choose a lower calorie option. For example, a regular margarita can have up to 700 calories and a glass of wine has around 150.  Stay away from items that are buttered, battered, fried, or served with cream sauce. Items labeled "crispy" are usually fried, unless stated otherwise.  Ask for dressings, sauces, and syrups on the side. These are usually very high in calories, so do not eat much of them.  Watch out for salads. Many people think salads are a healthy option, but this is often not the case. Many salads come with bacon, fried chicken, lots of cheese, fried chips, and dressing. All of these items have a lot of calories. If you want a salad, choose a garden salad and ask for grilled meats or steak. Ask for the dressing on the side, or ask for olive oil and vinegar or lemon to use as dressing.  Estimate how many servings of a food you are given. For example, a serving of cooked rice is  cup or about the size of half a tennis ball or one cupcake wrapper. Knowing serving sizes will help you be aware of how much food you are  eating at restaurants. The list below tells you how big or small some common portion sizes are based on everyday objects.  1 oz--4 stacked dice.  3 oz--1 deck of cards.  1 tsp--1 dice.  1 Tbsp-- a Ping-Pong ball.  2 Tbsp--1 Ping-Pong ball.   cup--1 tennis ball or 1 cupcake wrapper.  1 cup--1 baseball. Document Released: 09/11/2005 Document Revised: 01/26/2014 Document Reviewed: 07/17/2013 [facility] Patient Information 2015 [place], [place]. This information is not intended to replace advice given to you by your health care provider. Make sure you discuss any questions you have with your health care provider. Cutaneous Candidiasis Cutaneous candidiasis is a condition in which there is an overgrowth of yeast (candida) on the skin. Yeast normally live on the skin, but in small enough numbers not to cause any symptoms. In certain cases, increased growth of the yeast may cause an actual yeast infection. This kind of infection usually occurs in areas of the skin that are constantly warm and moist, such as the armpits or the groin. Yeast is the most common cause of diaper rash in babies and in people who cannot control their bowel movements (incontinence). CAUSES  The fungus that most often causes cutaneous candidiasis is Candida albicans. Conditions that can increase the risk of getting a yeast infection of the skin include:  Obesity.  Pregnancy.  Diabetes.  Taking antibiotic medicine.  Taking birth control pills.  Taking steroid medicines.  Thyroid disease.  An iron or zinc deficiency.  Problems with the immune system. SYMPTOMS   Red, swollen area of the skin.  Bumps on the skin.  Itchiness. DIAGNOSIS  The diagnosis of cutaneous candidiasis is usually based on its appearance. Light scrapings of the skin may also be taken and viewed under a microscope to identify the presence of yeast. TREATMENT  Antifungal creams may be applied to the infected skin.  In severe cases, oral  medicines may be needed.  HOME CARE INSTRUCTIONS   Keep your skin clean and dry.  Maintain a healthy weight.  If you have diabetes, keep your blood sugar under control. SEEK IMMEDIATE MEDICAL CARE IF:  Your rash continues to spread despite treatment.  You have a fever, chills, or abdominal pain. Document Released: 05/30/2011 Document Revised: 12/04/2011 Document Reviewed: 05/30/2011 [facility] Patient Information 2015 [place], [place]. This information is not intended to replace advice given to you by your health care provider. Make sure you discuss any questions you have with your health care provider.

## 2014-11-03 NOTE — Assessment & Plan Note (Signed)
Left Axilla.  Plan: -Miconazole 2% cream BID
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
## 2014-11-03 NOTE — Assessment & Plan Note (Signed)
Discussed strategies for weight loss and diet including cutting back on sodas. Patient provided material on counting calories.

## 2014-11-03 NOTE — Assessment & Plan Note (Signed)
Hemoglobin 12.3 during last visit. MCV low at 80, but normal hemoglobin and patient no longer having menstrual cycles.   Plan: -Continue to monitor.

## 2014-11-03 NOTE — Assessment & Plan Note (Signed)
-  Received tetanus shot in 2009 -Refer for mammogram -Refer for removal of Nexplanon

## 2014-11-03 NOTE — Progress Notes (Signed)
   Subjective:    Patient ID: [name] [name], female    DOB: February 27, 1971, 44 y.o.   MRN: [id number]  HPI Ms. [name] is a 44 yo female with PMHx of allergic rhinitis, iron deficiency anemia, and morbid obesity who presents today for routine follow up. Please see problem based assessment and plan for more information.  Review of Systems General: Denies fatigue, change in appetite Respiratory: Denies SOB Cardiovascular: Denies chest pain  Gastrointestinal: Denies diarrhea, constipation Genitourinary: Denies dysuria, discharge or pelvic pain. Denies menorrhagia. Musculoskeletal: Admits to chronic low back pain with radiation of pain into bilaterally extremities (chronic). Denies myalgias, joint swelling, and gait problem.  Skin: Admits to rash in left axilla.  Neurological: Denies dizziness, headaches, weakness, lightheadedness  Past Medical History  Diagnosis Date  . Anemia   . Hypertension   . Pneumonia 2006  . Migraines   . Headache(784.0) 02/29/12    "frequent"  . Arthritis     "left knee"  . Chronic lower back pain     "on the left side"  . Anxiety   . Stress at home     "and at work"   Outpatient Encounter Prescriptions as of 11/03/2014  Medication Sig  . fluticasone (FLONASE) 50 MCG/ACT nasal spray Place 2 sprays into both nostrils daily.  . naproxen (NAPROSYN) 500 MG tablet Take 1 tablet (500 mg total) by mouth 2 (two) times daily with a meal.      Objective:   Physical Exam Filed Vitals:   11/03/14 1351  BP: 135/73  Pulse: 80  Temp: 98.2 F (36.8 C)  TempSrc: Oral  Weight: 269 lb 6.4 oz (122.199 kg)  SpO2: 99%   General: Vital signs reviewed.  Patient is obese female, in no acute distress and cooperative with exam.  Neck: Supple, no thyromegaly or carotid bruit present.  Cardiovascular: RRR, S1 normal, S2 normal, no murmurs, gallops, or rubs. Pulmonary/Chest: Clear to auscultation bilaterally, no wheezes, rales, or rhonchi. Abdominal: Soft, non-tender,  non-distended, BS + Pelvic Exam: Normal cervix, pink with normal amount of discharge, normal odor. Not able to palpate ovaries on bimanual examination.   Extremities: No lower extremity edema bilaterally, tender on palpation of SI joints, normal ROM.  Neurological: A&O x3, Strength is normal and symmetric bilaterally, sensory intact to light touch bilaterally.  Skin: Moist hyperpigmented skin in left axilla with minimal erythema and minimal maceration consistent with candidal intertrigo. Psychiatric: Normal mood and affect. speech and behavior is normal. Cognition and memory are normal.      Assessment & Plan:   Please see problem based assessment and plan.

## 2014-11-03 NOTE — Assessment & Plan Note (Signed)
Pap smear, pelvic exam and bimanual exam performed. Specimen sent for cytology. Exam normal.  Plan: -Await results -Repeat in 1-2 years

## 2014-11-05 LAB — CYTOLOGY - PAP

## 2014-11-06 NOTE — Progress Notes (Signed)
INTERNAL MEDICINE TEACHING ATTENDING ADDENDUM - Jerimie Mancuso, MD: I reviewed and discussed at the time of visit with the resident Dr. Richardson, the patient's medical history, physical examination, diagnosis and results of pertinent tests and treatment and I agree with the patient's care as documented.  

## 2014-11-19 ENCOUNTER — Other Ambulatory Visit: Payer: Self-pay | Admitting: Internal Medicine

## 2014-11-19 ENCOUNTER — Encounter: Payer: Self-pay | Admitting: Obstetrics & Gynecology

## 2014-11-19 DIAGNOSIS — Z1231 Encounter for screening mammogram for malignant neoplasm of breast: Secondary | ICD-10-CM

## 2014-11-23 ENCOUNTER — Ambulatory Visit
Admission: RE | Admit: 2014-11-23 | Discharge: 2014-11-23 | Disposition: A | Discharge status: Home / Self Care | Payer: Medicaid Other | Source: Ambulatory Visit | Attending: Internal Medicine | Admitting: Internal Medicine

## 2014-11-23 DIAGNOSIS — Z1231 Encounter for screening mammogram for malignant neoplasm of breast: Secondary | ICD-10-CM

## 2014-11-24 ENCOUNTER — Other Ambulatory Visit: Payer: Self-pay | Admitting: *Deleted

## 2014-11-24 ENCOUNTER — Encounter: Payer: Self-pay | Admitting: Internal Medicine

## 2014-11-24 DIAGNOSIS — J3089 Other allergic rhinitis: Secondary | ICD-10-CM

## 2014-11-24 DIAGNOSIS — M5416 Radiculopathy, lumbar region: Secondary | ICD-10-CM

## 2014-11-24 MED ORDER — FLUTICASONE PROPIONATE 50 MCG/ACT NA SUSP: 2.0000 | Freq: Every day | NASAL | Status: DC

## 2014-11-24 MED ORDER — GABAPENTIN 300 MG PO CAPS: 300.0000 mg | ORAL_CAPSULE | Freq: Three times a day (TID) | ORAL | Status: DC

## 2014-11-24 MED ORDER — NAPROXEN 500 MG PO TABS: 500.0000 mg | ORAL_TABLET | Freq: Two times a day (BID) | ORAL | Status: DC

## 2014-12-21 ENCOUNTER — Encounter: Payer: Medicaid Other | Admitting: Obstetrics & Gynecology

## 2014-12-21 NOTE — Addendum Note (Signed)
Addended by: Neomia DearPOWERS, Evan Mackie E on: 12/21/2014 09:29 PM   Modules accepted: Orders

## 2015-01-05 ENCOUNTER — Telehealth: Payer: Self-pay | Admitting: *Deleted

## 2015-01-05 NOTE — Telephone Encounter (Signed)
Pt sent an e-mail stating she is still having pain in hips, legs and knees while sitting and sleeping. Also sometimes while walking.   The meds are working but she can't take them during the day when in class because they make her tired and sleepy.   She is asking if there is anything else that can be done.  Please advise. Pt # [id number]

## 2015-01-06 NOTE — Telephone Encounter (Signed)
Pt scheduled on 4/19 in clinic. She states she has tried all the suggestions given by Dr Senaida Oresichardson without relief.

## 2015-01-06 NOTE — Telephone Encounter (Signed)
Pain is likely related to patient's chronic lumbar radiculopathy. Patient has been tried on Gabapentin 300 mg QHS without much relief. We also tried Naproxen 500 mg BID, PT exercises at home, heat pads and bengay if desired, I would see if patient has been trying all these things. We could increased gabapentin to 600 mg QHS for symptom relief at night since that is primarily when she has symptoms. I would recommend patient be seen again in clinic and considered for repeat imaging with possible referral and further medication management.   Please let me know if you have any questions.

## 2015-01-06 NOTE — Telephone Encounter (Signed)
Pt called but not at home.  Message left to call clinic 

## 2015-01-11 ENCOUNTER — Telehealth: Payer: Self-pay | Admitting: Internal Medicine

## 2015-01-11 NOTE — Telephone Encounter (Signed)
Call to patient to confirm appointment for 01/12/15 at 10:45. lmtcb ° °

## 2015-01-12 ENCOUNTER — Ambulatory Visit (INDEPENDENT_AMBULATORY_CARE_PROVIDER_SITE_OTHER): Payer: Medicaid Other | Admitting: Internal Medicine

## 2015-01-12 ENCOUNTER — Encounter: Payer: Self-pay | Admitting: Internal Medicine

## 2015-01-12 ENCOUNTER — Ambulatory Visit (HOSPITAL_COMMUNITY)
Admission: RE | Admit: 2015-01-12 | Discharge: 2015-01-12 | Disposition: A | Discharge status: Home / Self Care | Payer: Medicaid Other | Source: Ambulatory Visit | Attending: Internal Medicine | Admitting: Internal Medicine

## 2015-01-12 VITALS — BP 149/93 | HR 88 | Temp 98.2°F | Wt 277.0 lb

## 2015-01-12 DIAGNOSIS — M5416 Radiculopathy, lumbar region: Secondary | ICD-10-CM | POA: Diagnosis not present

## 2015-01-12 DIAGNOSIS — Q7649 Other congenital malformations of spine, not associated with scoliosis: Secondary | ICD-10-CM | POA: Insufficient documentation

## 2015-01-12 DIAGNOSIS — D509 Iron deficiency anemia, unspecified: Secondary | ICD-10-CM | POA: Diagnosis not present

## 2015-01-12 DIAGNOSIS — M545 Low back pain: Secondary | ICD-10-CM | POA: Diagnosis present

## 2015-01-12 MED ORDER — GABAPENTIN 300 MG PO CAPS: 600.0000 mg | ORAL_CAPSULE | Freq: Three times a day (TID) | ORAL | Status: DC

## 2015-01-12 NOTE — Patient Instructions (Addendum)
It was a pleasure seeing you today, [name] [name].   - Lumbar x-ray today - Increase Gabapentin to 600 mg three times a day - Continue Naproxen 500 mg at bedtime. Can take extra dose in morning on days you don't have classes.  - Follow up in 2 weeks - Continue walking every day. Weight loss is key to helping your back pain.   General Instructions:   Thank you for bringing your medicines today. This helps us keep you safe from mistakes.   Progress Toward Treatment Goals:  No flowsheet data found.  Self Care Goals & Plans:  Self Care Goal 09/07/2014  Manage my medications take my medicines as prescribed  Eat healthy foods drink diet soda or water instead of juice or soda; eat more vegetables; eat foods that are low in salt; eat baked foods instead of fried foods; eat fruit for snacks and desserts    No flowsheet data found.   Care Management & Community Referrals:  No flowsheet data found.

## 2015-01-13 NOTE — Assessment & Plan Note (Addendum)
Patient's new symptoms of difficulty going up stairs and numbness in her buttocks is concerning. She has no prior imaging of her lumbar spine. Will get x-ray today to assess lumbar spine and if is not indicative of reason for her pain then MRI lumbar spine should be considered.  - X-ray lumbar spine - Consider MRI lumbar spine if x-ray not conclusive. Will need bmet to assess kidney function for contrast - Increase Gabapentin to 600 mg TID - Continue Naproxen 500 mg at bedtime. Told patient to take during day on days she does not have classes.  - Continue home PT exercises  - Recommended to try heating pad

## 2015-01-13 NOTE — Progress Notes (Signed)
Internal Medicine Clinic Attending  Case discussed with Dr. Rivet at the time of the visit.  We reviewed the resident's history and exam and pertinent patient test results.  I agree with the assessment, diagnosis, and plan of care documented in the resident's note.  
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
## 2015-01-26 ENCOUNTER — Ambulatory Visit (INDEPENDENT_AMBULATORY_CARE_PROVIDER_SITE_OTHER): Payer: Medicaid Other | Admitting: Internal Medicine

## 2015-01-26 ENCOUNTER — Encounter: Payer: Self-pay | Admitting: Internal Medicine

## 2015-01-26 VITALS — BP 148/94 | HR 78 | Temp 98.0°F | Ht 62.0 in | Wt 277.5 lb

## 2015-01-26 DIAGNOSIS — Z3169 Encounter for other general counseling and advice on procreation: Secondary | ICD-10-CM

## 2015-01-26 DIAGNOSIS — G8929 Other chronic pain: Secondary | ICD-10-CM | POA: Diagnosis not present

## 2015-01-26 DIAGNOSIS — M5416 Radiculopathy, lumbar region: Secondary | ICD-10-CM

## 2015-01-26 DIAGNOSIS — D509 Iron deficiency anemia, unspecified: Secondary | ICD-10-CM | POA: Diagnosis present

## 2015-01-26 NOTE — Progress Notes (Signed)
Subjective:   Patient ID: [name] [name] female   DOB: [birth year] 44 y.o.   MRN: [id number]  HPI: [name] [name] is a 44 y.o. woman pmh as listed below presents for her chronic lumbar radiculopathy and follow-up on recent imaging studies.  Patient presented on 01/13/15 with difficulty going up and downstairs and numbness in her buttocks. At that time she had a lumbar x-ray that showed mild degenerative facet joint changes at L4-S1 and is sacralization of L5 on the left. These results were shared with the patient. Since her visit the patient states that she has had no change in her symptoms but slight improvement with the medications. She continues to experience some buttock pain and some numbness . She continues to deny saddle anesthesia, urinary/stool incontinence, or lower extremity weakness.   She is accompanied by her fiance and they are interested in preconception counseling. She continues with the Nexplon Implant. She is unaware if she has entered into pre-menopause or when her other female family members have entered into menopause. She has had one child before with some delivery complications but no gestational diabetes or preeclampsia. She doesn't smoke or use etoh or other illicit drugs.    Past Medical History  Diagnosis Date  . Anemia   . Hypertension   . Pneumonia 2006  . Migraines   . Headache(784.0) 02/29/12    "frequent"  . Arthritis     "left knee"  . Chronic lower back pain     "on the left side"  . Anxiety   . Stress at home     "and at work"   Current Outpatient Prescriptions  Medication Sig Dispense Refill  . fluticasone (FLONASE) 50 MCG/ACT nasal spray Place 2 sprays into both nostrils daily. 16 g 3  . gabapentin (NEURONTIN) 300 MG capsule Take 2 capsules (600 mg total) by mouth 3 (three) times daily. 180 capsule 2  . miconazole (MICOTIN) 2 % cream Apply 1 application topically 2 (two) times daily. 28.35 g 0  . naproxen (NAPROSYN) 500 MG tablet Take 1  tablet (500 mg total) by mouth 2 (two) times daily with a meal. 28 tablet 5   No current facility-administered medications for this visit.   Family History  Problem Relation Age of Onset  . Cancer Other    History   Social History  . Marital Status: Single    Spouse Name: N/A  . Number of Children: N/A  . Years of Education: N/A   Social History Main Topics  . Smoking status: Never Smoker   . Smokeless tobacco: Never Used  . Alcohol Use: No     Comment: "tried alcohol once when I was 40"  . Drug Use: No  . Sexual Activity: Yes    Birth Control/ Protection: Implant   Other Topics Concern  . None   Social History Narrative   Review of Systems: Pertinent items are noted in HPI. Objective:  Physical Exam: Filed Vitals:   01/26/15 0950  BP: 148/94  Pulse: 78  Temp: 98 F (36.7 C)  TempSrc: Oral  Height:  (1.575 m)  Weight: 277 lb 8 oz (125.873 kg)  SpO2: 98%   General: sitting in chair, NAD Cardiac: RRR, no rubs, murmurs or gallops Pulm: clear to auscultation bilaterally, moving normal volumes of air Abd: soft, obese, nontender, nondistended, BS present Ext: warm and well perfused, no pedal edema Neuro: alert and oriented X3, cranial nerves II-XII grossly intact, some numbness in the right foot medial aspect,  5/5 LE and UE strength, 2+ DTRs  Assessment & Plan:  Please see problem oriented charting  Pt discussed with Dr. Heide SparkNarendra

## 2015-01-26 NOTE — Assessment & Plan Note (Signed)
The patient and her fianc are inquiring about possible risks with conception. The patient is still on the nexplon implant. Extensive discussion regarding the need for weight loss, removal of contraception, folic acid supplementation, risk of congenital defects, and need for high risk OB-gyn care was all had with the patient.  Wt Readings from Last 3 Encounters:  01/26/15 277 lb 8 oz (125.873 kg)  01/12/15 277 lb (125.646 kg)  11/03/14 269 lb 6.4 oz (122.199 kg)   -information provided. Would need appt to remove nexplon if interested in conceiving

## 2015-01-26 NOTE — Patient Instructions (Signed)
General Instructions:   Thank you for bringing your medicines today. This helps us keep you safe from mistakes.   Progress Toward Treatment Goals:  No flowsheet data found.  Self Care Goals & Plans:  Self Care Goal 09/07/2014  Manage my medications take my medicines as prescribed  Eat healthy foods drink diet soda or water instead of juice or soda; eat more vegetables; eat foods that are low in salt; eat baked foods instead of fried foods; eat fruit for snacks and desserts    No flowsheet data found.   Care Management & Community Referrals:  No flowsheet data found.

## 2015-01-26 NOTE — Progress Notes (Signed)
INTERNAL MEDICINE TEACHING ATTENDING ADDENDUM - [name] Olden, MD: I reviewed and discussed at the time of visit with the resident Dr. Sadek, the patient's medical history, physical examination, diagnosis and results of pertinent tests and treatment and I agree with the patient's care as documented.  

## 2015-01-26 NOTE — Assessment & Plan Note (Signed)
Patient does not present with any red flag symptoms today. She has been improving on the gabapentin and naproxen as scheduled. She did participate in physical therapy once was unable to continue due to her insurance payment. Her imaging results from her lumbar x-ray were reviewed with the patient and her fianc during the visit and all questions were answered. -There is no need for MRI at this time unless the patient has a dramatic change in symptoms -Continue with gabapentin 600 mg 3 times a day and naproxen -Information regarding physical therapy exercises and stretches were given -Recommendation for walking daily, weight loss, and possible water exercise was discussed with the patient with resources provided

## 2015-05-14 ENCOUNTER — Ambulatory Visit (HOSPITAL_COMMUNITY)
Admission: RE | Admit: 2015-05-14 | Discharge: 2015-05-14 | Disposition: A | Discharge status: Home / Self Care | Payer: Medicaid Other | Source: Ambulatory Visit | Attending: Oncology | Admitting: Oncology

## 2015-05-14 ENCOUNTER — Encounter: Payer: Self-pay | Admitting: Internal Medicine

## 2015-05-14 ENCOUNTER — Ambulatory Visit (INDEPENDENT_AMBULATORY_CARE_PROVIDER_SITE_OTHER): Payer: Medicaid Other | Admitting: Internal Medicine

## 2015-05-14 VITALS — BP 121/84 | HR 77 | Temp 98.2°F | Ht 62.0 in | Wt 282.8 lb

## 2015-05-14 DIAGNOSIS — M79671 Pain in right foot: Secondary | ICD-10-CM

## 2015-05-14 DIAGNOSIS — D509 Iron deficiency anemia, unspecified: Secondary | ICD-10-CM | POA: Diagnosis not present

## 2015-05-14 DIAGNOSIS — R5383 Other fatigue: Secondary | ICD-10-CM

## 2015-05-14 DIAGNOSIS — G4733 Obstructive sleep apnea (adult) (pediatric): Secondary | ICD-10-CM | POA: Insufficient documentation

## 2015-05-14 MED ORDER — TRAMADOL HCL 50 MG PO TABS: 50.0000 mg | ORAL_TABLET | Freq: Two times a day (BID) | ORAL | Status: DC | PRN

## 2015-05-14 NOTE — Progress Notes (Signed)
   Subjective:    Patient ID: [name] [name], female    DOB: Sep 02, 1971, 44 y.o.   MRN: [id number]  HPI [name] is a 44yo woman with PMHx of chronic lumbar radiculopathy and morbid obesity who presents today for right foot pain. Patient notes the pain started about 3 weeks ago. Denies any trauma. She describes the pain as sharp and located along the lateral side and top of foot. She has been taking naproxen 500 mg BID and gabapentin 600 mg TID for her chronic low back pain and knee pain without any relief. She states none of her pains are controlled. She notes decreased sleep due to pain. She reports increased difficulty walking due to pain.   Patient also notes increased fatigue. She states she feels completely drained even walking her son to the bus stop which is a block away from the house. She denies dyspnea. She does have a history of iron deficiency anemia but is currently not on iron supplementation. She denies any blood losses. She notes increased weight gain as well despite making efforts to improve her diet by eating more vegetables and cutting out fried foods. She has met with Butch Penny several times to help her with nutrition which she found beneficial.    Review of Systems General: Denies fever, chills, night sweats, changes in appetite HEENT: Denies headaches, ear pain, changes in vision, rhinorrhea, sore throat CV: Denies CP, palpitations, SOB, orthopnea Pulm: Denies SOB, cough, wheezing GI: Denies abdominal pain, nausea, vomiting, diarrhea, constipation, melena, hematochezia GU: Denies dysuria, hematuria, frequency Msk: Denies muscle cramps Neuro: Denies weakness, numbness, tingling Skin: Denies rashes, bruising    Objective:   Physical Exam General: alert, sitting up in chair, obese, NAD HEENT: [place]/AT, EOMI, sclera anicteric, mucus membranes moist CV: RRR, no m/g/r Pulm: CTA bilaterally, breaths non-labored Abd: BS+, soft, obese, non-tender Ext: warm, ankles appear  edematous bilaterally. No tenderness to palpation of right foot. Patient able to dorsi- and plantar-flex right foot without eliciting pain. Pain elicited when standing on right foot. Distal pulses 2+. Neuro: alert and oriented x 3, no focal deficits    Assessment & Plan:  Please refer to A&P documentation.

## 2015-05-14 NOTE — Assessment & Plan Note (Signed)
Patient with increased fatigue and continued weight gain. Denies dyspnea. Will assess for possible hypothyroidism or iron deficiency anemia. Will check TSH and CBC.

## 2015-05-14 NOTE — Assessment & Plan Note (Addendum)
Patient presenting with 3 week history of right foot pain along the lateral side and top of foot. She does have a history of chronic pain in her low back, hips, and knees likely all secondary to her morbid obesity. Her new foot pain is likely a result of her obesity as well. She has continued to gain weight: 269 (Feb)> 277 (May)> 282 (today). We had a long discussion about how her obesity is the likely culprit for her musculoskeletal pains. She is frustrated because she continues to gain weight despite making changes to her diet and trying to walk as much as she can but is limited due to pain. - X-ray of right foot to assess for stress fracture - Gave a Rx for Tramadol 50 mg Q12H PRN. I explained this should only be taken for severe pain and would only be a one time rx. - Continue Gabapentin and Naproxen - [facility]

## 2015-05-14 NOTE — Patient Instructions (Signed)
-   X-ray right foot - Continue taking Gabapentin 600 mg three times daily and Naproxen 500 mg twice daily - Try taking Tramadol 50 mg every 12 hours as needed for when the pain is severe - Try ice/heating pads  General Instructions:   Please bring your medicines with you each time you come to clinic.  Medicines may include prescription medications, over-the-counter medications, herbal remedies, eye drops, vitamins, or other pills.   Progress Toward Treatment Goals:  No flowsheet data found.  Self Care Goals & Plans:  Self Care Goal 05/14/2015  Manage my medications take my medicines as prescribed; bring my medications to every visit; refill my medications on time  Eat healthy foods drink diet soda or water instead of juice or soda; eat more vegetables; eat foods that are low in salt; eat baked foods instead of fried foods; eat fruit for snacks and desserts    No flowsheet data found.   Care Management & Community Referrals:  No flowsheet data found.

## 2015-05-15 LAB — TSH: TSH: 1.2 u[IU]/mL (ref 0.450–4.500)

## 2015-05-15 LAB — CBC
HEMATOCRIT: 36.7 % (ref 34.0–46.6)
HEMOGLOBIN: 11.6 g/dL (ref 11.1–15.9)
MCH: 24.7 pg — AB (ref 26.6–33.0)
MCHC: 31.6 g/dL (ref 31.5–35.7)
MCV: 78 fL — AB (ref 79–97)
Platelets: 495 10*3/uL — ABNORMAL HIGH (ref 150–379)
RBC: 4.7 x10E6/uL (ref 3.77–5.28)
RDW: 15.8 % — ABNORMAL HIGH (ref 12.3–15.4)
WBC: 7.8 10*3/uL (ref 3.4–10.8)

## 2015-05-17 NOTE — Progress Notes (Signed)
Internal Medicine Clinic Attending  Case discussed with Dr. Rivet soon after the resident saw the patient.  We reviewed the resident's history and exam and pertinent patient test results.  I agree with the assessment, diagnosis, and plan of care documented in the resident's note.  

## 2015-06-29 ENCOUNTER — Encounter: Payer: Self-pay | Admitting: Internal Medicine

## 2015-06-30 ENCOUNTER — Encounter: Payer: Medicaid Other | Admitting: Internal Medicine

## 2015-07-07 ENCOUNTER — Ambulatory Visit (INDEPENDENT_AMBULATORY_CARE_PROVIDER_SITE_OTHER): Payer: Medicaid Other | Admitting: Internal Medicine

## 2015-07-07 ENCOUNTER — Encounter: Payer: Self-pay | Admitting: Internal Medicine

## 2015-07-07 DIAGNOSIS — M5416 Radiculopathy, lumbar region: Secondary | ICD-10-CM

## 2015-07-07 DIAGNOSIS — R51 Headache: Secondary | ICD-10-CM | POA: Diagnosis not present

## 2015-07-07 DIAGNOSIS — M79671 Pain in right foot: Secondary | ICD-10-CM | POA: Diagnosis not present

## 2015-07-07 DIAGNOSIS — G44211 Episodic tension-type headache, intractable: Secondary | ICD-10-CM

## 2015-07-07 DIAGNOSIS — G8929 Other chronic pain: Secondary | ICD-10-CM

## 2015-07-07 DIAGNOSIS — R519 Headache, unspecified: Secondary | ICD-10-CM | POA: Insufficient documentation

## 2015-07-07 DIAGNOSIS — Z23 Encounter for immunization: Secondary | ICD-10-CM | POA: Diagnosis not present

## 2015-07-07 DIAGNOSIS — Z0000 Encounter for general adult medical examination without abnormal findings: Secondary | ICD-10-CM

## 2015-07-07 LAB — GLUCOSE, CAPILLARY: Glucose-Capillary: 86 mg/dL (ref 65–99)

## 2015-07-07 LAB — POCT GLYCOSYLATED HEMOGLOBIN (HGB A1C): Hemoglobin A1C: 5.8

## 2015-07-07 MED ORDER — DICLOFENAC SODIUM 1 % TD GEL: 2.0000 g | Freq: Four times a day (QID) | TRANSDERMAL | Status: DC

## 2015-07-07 MED ORDER — KETOROLAC TROMETHAMINE 30 MG/ML IJ SOLN
30.0000 mg | Freq: Once | INTRAMUSCULAR | Status: AC
  Administered 2015-07-07: 30 mg via INTRAMUSCULAR

## 2015-07-07 MED ORDER — KETOROLAC TROMETHAMINE 30 MG/ML IM SOLN: 30.0000 mg | Freq: Once | INTRAMUSCULAR | Status: DC

## 2015-07-07 MED ORDER — KETOROLAC TROMETHAMINE 30 MG/ML IJ SOLN: 30.0000 mg | Freq: Once | INTRAMUSCULAR | Status: DC

## 2015-07-07 MED ORDER — NAPROXEN 500 MG PO TABS: 500.0000 mg | ORAL_TABLET | Freq: Two times a day (BID) | ORAL | Status: DC

## 2015-07-07 MED ORDER — GABAPENTIN 300 MG PO CAPS: 600.0000 mg | ORAL_CAPSULE | Freq: Every day | ORAL | Status: DC

## 2015-07-07 NOTE — Patient Instructions (Signed)
FOR YOUR RIGHT FOOT PAIN: -WE HAVE REFERRED YOU TO SPORTS MEDICINE -YOU CAN CONTINUE TAKING THE NAPROXEN -USE VOLTAREN GEL UP TO 4 TIMES A DAY ON THE AFFECTED SPOT -TRY FOOT SUPPORTS AND ANKLE BRACE IF IT HELPS -CONTINUE TO WORK ON WEIGHT LOSS  Calorie Counting for Weight Loss Calories are energy you get from the things you eat and drink. Your body uses this energy to keep you going throughout the day. The number of calories you eat affects your weight. When you eat more calories than your body needs, your body stores the extra calories as fat. When you eat fewer calories than your body needs, your body Anette Barra fat to get the energy it needs. Calorie counting means keeping track of how many calories you eat and drink each day. If you make sure to eat fewer calories than your body needs, you should lose weight. In order for calorie counting to work, you will need to eat the number of calories that are right for you in a day to lose a healthy amount of weight per week. A healthy amount of weight to lose per week is usually 1-2 lb (0.5-0.9 kg). A dietitian can determine how many calories you need in a day and give you suggestions on how to reach your calorie goal.  WHAT IS MY MY PLAN? My goal is to have __________ calories per day.  If I have this many calories per day, I should lose around __________ pounds per week. WHAT DO I NEED TO KNOW ABOUT CALORIE COUNTING? In order to meet your daily calorie goal, you will need to:  Find out how many calories are in each food you would like to eat. Try to do this before you eat.  Decide how much of the food you can eat.  Write down what you ate and how many calories it had. Doing this is called keeping a food log. WHERE DO I FIND CALORIE INFORMATION? The number of calories in a food can be found on a Nutrition Facts label. Note that all the information on a label is based on a specific serving of the food. If a food does not have a Nutrition Facts label, try  to look up the calories online or ask your dietitian for help. HOW DO I DECIDE HOW MUCH TO EAT? To decide how much of the food you can eat, you will need to consider both the number of calories in one serving and the size of one serving. This information can be found on the Nutrition Facts label. If a food does not have a Nutrition Facts label, look up the information online or ask your dietitian for help. Remember that calories are listed per serving. If you choose to have more than one serving of a food, you will have to multiply the calories per serving by the amount of servings you plan to eat. For example, the label on a package of bread might say that a serving size is 1 slice and that there are 90 calories in a serving. If you eat 1 slice, you will have eaten 90 calories. If you eat 2 slices, you will have eaten 180 calories. HOW DO I KEEP A FOOD LOG? After each meal, record the following information in your food log:  What you ate.  How much of it you ate.  How many calories it had.  Then, add up your calories. Keep your food log near you, such as in a small notebook in your pocket.  Another option is to use a mobile app or website. Some programs will calculate calories for you and show you how many calories you have left each time you add an item to the log. WHAT ARE SOME CALORIE COUNTING TIPS?  Use your calories on foods and drinks that will fill you up and not leave you hungry. Some examples of this include foods like nuts and nut butters, vegetables, lean proteins, and high-fiber foods (more than 5 g fiber per serving).  Eat nutritious foods and avoid empty calories. Empty calories are calories you get from foods or beverages that do not have many nutrients, such as candy and soda. It is better to have a nutritious high-calorie food (such as an avocado) than a food with few nutrients (such as a bag of chips).  Know how many calories are in the foods you eat most often. This way, you  do not have to look up how many calories they have each time you eat them.  Look out for foods that may seem like low-calorie foods but are really high-calorie foods, such as baked goods, soda, and fat-free candy.  Pay attention to calories in drinks. Drinks such as sodas, specialty coffee drinks, alcohol, and juices have a lot of calories yet do not fill you up. Choose low-calorie drinks like water and diet drinks.  Focus your calorie counting efforts on higher calorie items. Logging the calories in a garden salad that contains only vegetables is less important than calculating the calories in a milk shake.  Find a way of tracking calories that works for you. Get creative. Most people who are successful find ways to keep track of how much they eat in a day, even if they do not count every calorie. WHAT ARE SOME PORTION CONTROL TIPS?  Know how many calories are in a serving. This will help you know how many servings of a certain food you can have.  Use a measuring cup to measure serving sizes. This is helpful when you start out. With time, you will be able to estimate serving sizes for some foods.  Take some time to put servings of different foods on your favorite plates, bowls, and cups so you know what a serving looks like.  Try not to eat straight from a bag or box. Doing this can lead to overeating. Put the amount you would like to eat in a cup or on a plate to make sure you are eating the right portion.  Use smaller plates, glasses, and bowls to prevent overeating. This is a quick and easy way to practice portion control. If your plate is smaller, less food can fit on it.  Try not to multitask while eating, such as watching TV or using your computer. If it is time to eat, sit down at a table and enjoy your food. Doing this will help you to start recognizing when you are full. It will also make you more aware of what and how much you are eating. HOW CAN I CALORIE COUNT WHEN EATING  OUT?  Ask for smaller portion sizes or child-sized portions.  Consider sharing an entree and sides instead of getting your own entree.  If you get your own entree, eat only half. Ask for a box at the beginning of your meal and put the rest of your entree in it so you are not tempted to eat it.  Look for the calories on the menu. If calories are listed, choose the lower calorie options.  Choose dishes that include vegetables, fruits, whole grains, low-fat dairy products, and lean protein. Focusing on smart food choices from each of the 5 food groups can help you stay on track at restaurants.  Choose items that are boiled, broiled, grilled, or steamed.  Choose water, milk, unsweetened iced tea, or other drinks without added sugars. If you want an alcoholic beverage, choose a lower calorie option. For example, a regular margarita can have up to 700 calories and a glass of wine has around 150.  Stay away from items that are buttered, battered, fried, or served with cream sauce. Items labeled "crispy" are usually fried, unless stated otherwise.  Ask for dressings, sauces, and syrups on the side. These are usually very high in calories, so do not eat much of them.  Watch out for salads. Many people think salads are a healthy option, but this is often not the case. Many salads come with bacon, fried chicken, lots of cheese, fried chips, and dressing. All of these items have a lot of calories. If you want a salad, choose a garden salad and ask for grilled meats or steak. Ask for the dressing on the side, or ask for olive oil and vinegar or lemon to use as dressing.  Estimate how many servings of a food you are given. For example, a serving of cooked rice is  cup or about the size of half a tennis ball or one cupcake wrapper. Knowing serving sizes will help you be aware of how much food you are eating at restaurants. The list below tells you how big or small some common portion sizes are based on  everyday objects.  1 oz--4 stacked dice.  3 oz--1 deck of cards.  1 tsp--1 dice.  1 Tbsp-- a Ping-Pong ball.  2 Tbsp--1 Ping-Pong ball.   cup--1 tennis ball or 1 cupcake wrapper.  1 cup--1 baseball.   This information is not intended to replace advice given to you by your health care provider. Make sure you discuss any questions you have with your health care provider.   Document Released: 09/11/2005 Document Revised: 10/02/2014 Document Reviewed: 07/17/2013 [employer] Interactive Patient Education [employer]

## 2015-07-07 NOTE — Progress Notes (Signed)
Subjective:    Patient ID: [name] [name], female    DOB: July 14, 1971, 44 y.o.   MRN: [id number]  HPI [name] [name] is a 44 y.o. female with PMHx of HTN, Obesity, and Chronic Pain who presents to the clinic for follow up for her right foot and right hip pain. Please see A&P for the status of the patient's chronic medical problems.   Patient presents with complaint of persistent chronic right foot pain. Her pain is located on the lateral and superior surface of her right foot. Pain occurs when she weight bears and walks. It's an aching pain that can be very severe up to a 10/10 to where she has to sit down. She has tried naproxen with some relief. Tramadol doesn't seem to help her pain. As previously discussed, patient knows that much of her problem is her weight. However, she is in too much pain to exercise. Previous right foot xray showed no evidence of fracture or dislocation, but did show arthritic changes and a plantar calcaneal spur (not consistent with current symptoms).   For her weight, she has gained 10 pounds since I last saw her. She admits to decreased exercise due to pain and we discuss strategies such as swimming and stationary bike. She does admits to drink a lot of sugary sodas as well. We go over strategies to cut back on soda intake as well. She has not followed up with the bariatric class.  Patient also admits to a headache for 3 days. Headache is worst in the morning, feels tight and pressure like in the front of her head and radiates to the back of her head. She denies any associated photophobia or phonophobia. She denies nausea or vomiting. Pain is an 8/10 at its worst and a 6/10 at its best. Naproxen help some. She cannot tolerate Tylenol.   Past Medical History  Diagnosis Date  . Anemia   . Hypertension   . Pneumonia 2006  . Migraines   . Headache(784.0) 02/29/12    "frequent"  . Arthritis     "left knee"  . Chronic lower back pain     "on the left side"  .  Anxiety   . Stress at home     "and at work"    Outpatient Encounter Prescriptions as of 07/07/2015  Medication Sig  . diclofenac sodium (VOLTAREN) 1 % GEL Apply 2 g topically 4 (four) times daily.  . fluticasone (FLONASE) 50 MCG/ACT nasal spray Place 2 sprays into both nostrils daily.  Marland Kitchen gabapentin (NEURONTIN) 300 MG capsule Take 2 capsules (600 mg total) by mouth at bedtime.  Marland Kitchen ketorolac (TORADOL) 30 MG/ML injection Inject 1 mL (30 mg total) into the muscle once.  . miconazole (MICOTIN) 2 % cream Apply 1 application topically 2 (two) times daily.  . naproxen (NAPROSYN) 500 MG tablet Take 1 tablet (500 mg total) by mouth 2 (two) times daily with a meal.  . [DISCONTINUED] gabapentin (NEURONTIN) 300 MG capsule Take 2 capsules (600 mg total) by mouth 3 (three) times daily.  . [DISCONTINUED] naproxen (NAPROSYN) 500 MG tablet Take 1 tablet (500 mg total) by mouth 2 (two) times daily with a meal.  . [DISCONTINUED] traMADol (ULTRAM) 50 MG tablet Take 1 tablet (50 mg total) by mouth every 12 (twelve) hours as needed for moderate pain.  . [EXPIRED] ketorolac (TORADOL) 30 MG/ML injection 30 mg   . [DISCONTINUED] ketorolac (TORADOL) 30 MG/ML injection 30 mg   . [DISCONTINUED] ketorolac (TORADOL) injection 30  mg    No facility-administered encounter medications on file as of 07/07/2015.    Family History  Problem Relation Age of Onset  . Cancer Other     Social History   Social History  . Marital Status: Single    Spouse Name: N/A  . Number of Children: N/A  . Years of Education: N/A   Occupational History  . Not on file.   Social History Main Topics  . Smoking status: Never Smoker   . Smokeless tobacco: Never Used  . Alcohol Use: No     Comment: "tried alcohol once when I was 40"  . Drug Use: No  . Sexual Activity: Not on file   Other Topics Concern  . Not on file   Social History Narrative   Review of Systems General: Denies fever, chills, fatigue, change in appetite    Respiratory: Denies SOB, cough, DOE, chest tightness   Cardiovascular: Denies chest pain and palpitations.  Gastrointestinal: Denies nausea, vomiting, abdominal pain, diarrhea, constipation Musculoskeletal: Admits to right foot pain. Denies myalgias, back pain, joint swelling Skin: Denies pallor, rash and wounds.  Neurological: Admits to headache. Denies dizziness, weakness, lightheadedness, numbness,     Objective:   Physical Exam Filed Vitals:   07/07/15 1326 07/07/15 1400  BP: 134/101 144/79  Pulse: 80 80  Temp: 98 F (36.7 C)   TempSrc: Oral   Weight: 281 lb 8 oz (127.688 kg)   SpO2: 99%    General: Vital signs reviewed.  Patient is obese, in no acute distress and cooperative with exam.  Head: Normocephalic and atraumatic. Eyes: EOMI, conjunctivae normal, no scleral icterus.  Neck: Supple, normal ROM, no tenderness.  Cardiovascular: RRR, S1 normal, S2 normal, no murmurs, gallops, or rubs. Pulmonary/Chest: Clear to auscultation bilaterally, no wheezes, rales, or rhonchi. Abdominal: Soft, non-tender, non-distended, BS + Musculoskeletal: No joint deformities, erythema, or stiffness, ROM full and nontender. Extremities: No lower extremity edema bilaterally, pulses symmetric and intact bilaterally. Neurological: Strength is normal and symmetric bilaterally in lower extremities, sensory intact to light touch bilaterally.  Skin: Warm, dry and intact. No rashes or erythema. Psychiatric: Normal mood and affect. speech and behavior is normal. Cognition and memory are normal.     Assessment & Plan:   Please see problem based assessment and plan for more information.

## 2015-07-08 LAB — BMP8+ANION GAP
ANION GAP: 17 mmol/L (ref 10.0–18.0)
BUN/Creatinine Ratio: 13 (ref 9–23)
BUN: 9 mg/dL (ref 6–24)
CALCIUM: 9.5 mg/dL (ref 8.7–10.2)
CHLORIDE: 103 mmol/L (ref 97–108)
CO2: 20 mmol/L (ref 18–29)
Creatinine, Ser: 0.72 mg/dL (ref 0.57–1.00)
GFR calc Af Amer: 119 mL/min/{1.73_m2} (ref >59)
GFR, EST NON AFRICAN AMERICAN: 104 mL/min/{1.73_m2} (ref >59)
GLUCOSE: 86 mg/dL (ref 65–99)
Potassium: 4 mmol/L (ref 3.5–5.2)
Sodium: 140 mmol/L (ref 134–144)

## 2015-07-08 NOTE — Assessment & Plan Note (Signed)
Patient presents with complaint of persistent chronic right foot pain. Her pain is located on the lateral and superior surface of her right foot. Pain occurs when she weight bears and walks. It's an aching pain that can be very severe up to a 10/10 to where she has to sit down. She has tried naproxen with some relief. Tramadol doesn't seem to help her pain. As previously discussed, patient knows that much of her problem is her weight. However, she is in too much pain to exercise. Previous right foot xray showed no evidence of fracture or dislocation, but did show arthritic changes and a plantar calcaneal spur (not consistent with current symptoms).   Plan: -Continue Naproxen 500 mg BID WC -Referral to Sports Medicine -Voltaren gel QID to right foot -Weight loss -Ice, heat, foot supports

## 2015-07-08 NOTE — Assessment & Plan Note (Addendum)
Mammogram: Normal 10/2014 Pap smear: Completed 10/2014 Flu Shot: 07/07/15

## 2015-07-08 NOTE — Assessment & Plan Note (Signed)
For her weight, she has gained 10 pounds since I last saw her. She admits to decreased exercise due to pain and we discuss strategies such as swimming and stationary bike. She does admits to drink a lot of sugary sodas as well. We go over strategies to cut back on soda intake as well. She has not followed up with the bariatric class.  Plan: -Referral to Sports Medicine, if pain can be improved, patient can exercise more -Try swimming and cycling -Emphasized portion control and cutting out calories in drinks

## 2015-07-08 NOTE — Assessment & Plan Note (Signed)
Patient also admits to a headache for 3 days. Headache is worst in the morning, feels tight and pressure like in the front of her head and radiates to the back of her head. She denies any associated photophobia or phonophobia. She denies nausea or vomiting. Pain is an 8/10 at its worst and a 6/10 at its best. Naproxen help some. She cannot tolerate Tylenol. Likely due to tension type headache; however, if symptoms persistent, consider OSA given her obesity.   Plan: -Toradol 30 mg IM once -Address symptoms of sleep apnea at follow up visit

## 2015-07-14 NOTE — Progress Notes (Signed)
Case discussed with Dr. Richardson soon after the resident saw the patient. We reviewed the resident's history and exam and pertinent patient test results. I agree with the assessment, diagnosis, and plan of care documented in the resident's note. 

## 2015-07-28 ENCOUNTER — Encounter: Payer: Self-pay | Admitting: Sports Medicine

## 2015-07-28 ENCOUNTER — Ambulatory Visit (INDEPENDENT_AMBULATORY_CARE_PROVIDER_SITE_OTHER): Payer: Medicaid Other | Admitting: Sports Medicine

## 2015-07-28 VITALS — BP 135/77 | HR 86 | Ht 60.0 in | Wt 281.0 lb

## 2015-07-28 DIAGNOSIS — M79671 Pain in right foot: Secondary | ICD-10-CM

## 2015-07-28 DIAGNOSIS — D509 Iron deficiency anemia, unspecified: Secondary | ICD-10-CM | POA: Diagnosis present

## 2015-07-28 NOTE — Progress Notes (Signed)
  [name] LimboKristie E [name] - 44 y.o. female MRN [id number]  Date of birth: 03/23/1971  CC: No chief complaint on file.   SUBJECTIVE:   HPI  Right foot pain for ~6-7 months: - no left foot pain - XR showed no fracture - Lateral dorsum of the foot  - Worsens with prolonged walking. Usually begins 10 minutes after she starts walking - Taking Ultram, voltaren gel. Takes naproxen 500mg  bid as well as gabapentin.  --Cream may be helping - Chronic back.  - Pain feels as though she is "walking on spikes" - No radiation from back or radiation to toes.   - Currently not working. Trying to walk 30 minutes daily.   ROS:     12 point RoS negative for MSK issue other than that listed above.   HISTORY: Past Medical, Surgical, Social, and Family History Reviewed & Updated per EMR.  Pertinent Historical Findings include: morbid obesity, chronic lumbar radiculopathy  OBJECTIVE: BP 135/77 mmHg  Pulse 86  Ht 5' (1.524 m)  Wt 281 lb (127.461 kg)  BMI 54.88 kg/m2  Physical Exam  Calm, NAD Non-labored breathing.   Ankle/foot: right No visible erythema or swelling. Range of motion is full in all directions. Strength is 5/5 in all directions. Stable lateral and medial ligaments. Talar dome nontender. + pain at base of 5th MT; + tenderness over cuboid; +tenderness over 4-5 TMT joint No tenderness on posterior aspects of lateral and medial malleolus Able to walk 4 steps, antalgic gait.  Pes planus, relatively neutral hindfoot  XR from 05/14/2015 reviewed: no obvious fracture.  Os peroneum.    U/s: long and short axis views of the right midfoot were obtained. Patient has pain over the peroneus brevis insertion and along the PB, but no sonographic abnormalities.  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    MEDICATIONS, LABS & OTHER ORDERS: Previous Medications   DICLOFENAC SODIUM (VOLTAREN) 1 % GEL    Apply 2 g topically 4 (four) times daily.   FLUTICASONE (FLONASE) 50 MCG/ACT NASAL SPRAY    Place 2  sprays into both nostrils daily.   GABAPENTIN (NEURONTIN) 300 MG CAPSULE    Take 2 capsules (600 mg total) by mouth at bedtime.   KETOROLAC (TORADOL) 30 MG/ML INJECTION    Inject 1 mL (30 mg total) into the muscle once.   MICONAZOLE (MICOTIN) 2 % CREAM    Apply 1 application topically 2 (two) times daily.   NAPROXEN (NAPROSYN) 500 MG TABLET    Take 1 tablet (500 mg total) by mouth 2 (two) times daily with a meal.   Modified Medications   No medications on file   New Prescriptions   No medications on file   Discontinued Medications   No medications on file  No orders of the defined types were placed in this encounter.   ASSESSMENT & PLAN: See problem based charting & AVS for pt instructions.

## 2015-07-28 NOTE — Assessment & Plan Note (Signed)
6-7 months of lateral right foot pain. Worsens with ambulation.  U/s relatively unremarkable.  Os peroneum present on XR. Pes planus with relatively neutral hindfoot.  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 Ddx includes stress rxn/fracture, arthritis, and pain 2/2 os peroneum, all of which may be exacerbated by her BMI.  - Green insoles with scaphoid pad provided.  This did make her feel better.   - Crutches for 1 week considering significantly antalgic gait.   - Continue with current pain medications.   - f/u 1 month.

## 2015-08-19 ENCOUNTER — Other Ambulatory Visit: Payer: Self-pay | Admitting: Internal Medicine

## 2015-08-24 ENCOUNTER — Encounter: Payer: Self-pay | Admitting: Internal Medicine

## 2015-08-25 ENCOUNTER — Ambulatory Visit (INDEPENDENT_AMBULATORY_CARE_PROVIDER_SITE_OTHER): Payer: Medicaid Other | Admitting: Internal Medicine

## 2015-08-25 ENCOUNTER — Encounter: Payer: Self-pay | Admitting: Internal Medicine

## 2015-08-25 VITALS — BP 126/77 | HR 74 | Temp 98.5°F | Ht 62.0 in | Wt 283.0 lb

## 2015-08-25 DIAGNOSIS — Z6841 Body Mass Index (BMI) 40.0 and over, adult: Secondary | ICD-10-CM | POA: Diagnosis not present

## 2015-08-25 DIAGNOSIS — G8929 Other chronic pain: Secondary | ICD-10-CM | POA: Diagnosis present

## 2015-08-25 DIAGNOSIS — M5416 Radiculopathy, lumbar region: Secondary | ICD-10-CM

## 2015-08-25 MED ORDER — DULOXETINE HCL 30 MG PO CPEP: 30.0000 mg | ORAL_CAPSULE | ORAL | Status: DC

## 2015-08-25 NOTE — Patient Instructions (Signed)
General Instructions:   Please bring your medicines with you each time you come to clinic.  Medicines may include prescription medications, over-the-counter medications, herbal remedies, eye drops, vitamins, or other pills.   Progress Toward Treatment Goals:  No flowsheet data found.  Self Care Goals & Plans:  Self Care Goal 08/25/2015  Manage my medications take my medicines as prescribed; bring my medications to every visit; refill my medications on time  Eat healthy foods -  Be physically active find an activity I enjoy  Prevent falls wear appropriate shoes; have my vision checked    No flowsheet data found.   Care Management & Community Referrals:  No flowsheet data found.     Duloxetine delayed-release capsules What is this medicine? DULOXETINE (doo LOX e teen) is used to treat depression, anxiety, and different types of chronic pain. This medicine may be used for other purposes; ask your health care provider or pharmacist if you have questions. What should I tell my health care provider before I take this medicine? They need to know if you have any of these conditions: -bipolar disorder or a family history of bipolar disorder -glaucoma -kidney disease -liver disease -suicidal thoughts or a previous suicide attempt -taken medicines called MAOIs like Carbex, Eldepryl, Marplan, Nardil, and Parnate within 14 days -an unusual reaction to duloxetine, other medicines, foods, dyes, or preservatives -pregnant or trying to get pregnant -breast-feeding How should I use this medicine? Take this medicine by mouth with a glass of water. Follow the directions on the prescription label. Do not cut, crush or chew this medicine. You can take this medicine with or without food. Take your medicine at regular intervals. Do not take your medicine more often than directed. Do not stop taking this medicine suddenly except upon the advice of your doctor. Stopping this medicine too quickly  may cause serious side effects or your condition may worsen. A special MedGuide will be given to you by the pharmacist with each prescription and refill. Be sure to read this information carefully each time. Talk to your pediatrician regarding the use of this medicine in children. While this drug may be prescribed for children as young as 23 years of age for selected conditions, precautions do apply. Overdosage: If you think you have taken too much of this medicine contact a poison control center or emergency room at once. NOTE: This medicine is only for you. Do not share this medicine with others. What if I miss a dose? If you miss a dose, take it as soon as you can. If it is almost time for your next dose, take only that dose. Do not take double or extra doses. What may interact with this medicine? Do not take this medicine with any of the following medications: -certain diet drugs like dexfenfluramine, fenfluramine -desvenlafaxine -linezolid -MAOIs like Azilect, Carbex, Eldepryl, Marplan, Nardil, and Parnate -methylene blue (intravenous) -milnacipran -thioridazine -venlafaxine This medicine may also interact with the following medications: -alcohol -aspirin and aspirin-like medicines -certain antibiotics like ciprofloxacin and enoxacin -certain medicines for blood pressure, heart disease, irregular heart beat -certain medicines for depression, anxiety, or psychotic disturbances -certain medicines for migraine headache like almotriptan, eletriptan, frovatriptan, naratriptan, rizatriptan, sumatriptan, zolmitriptan -certain medicines that treat or prevent blood clots like warfarin, enoxaparin, and dalteparin -cimetidine -fentanyl -lithium -NSAIDS, medicines for pain and inflammation, like ibuprofen or naproxen -phentermine -procarbazine -sibutramine -St. John's wort -theophylline -tramadol -tryptophan This list may not describe all possible interactions. Give your health care  provider a list  of all the medicines, herbs, non-prescription drugs, or dietary supplements you use. Also tell them if you smoke, drink alcohol, or use illegal drugs. Some items may interact with your medicine. What should I watch for while using this medicine? Tell your doctor if your symptoms do not get better or if they get worse. Visit your doctor or health care professional for regular checks on your progress. Because it may take several weeks to see the full effects of this medicine, it is important to continue your treatment as prescribed by your doctor. Patients and their families should watch out for new or worsening thoughts of suicide or depression. Also watch out for sudden changes in feelings such as feeling anxious, agitated, panicky, irritable, hostile, aggressive, impulsive, severely restless, overly excited and hyperactive, or not being able to sleep. If this happens, especially at the beginning of treatment or after a change in dose, call your health care professional. Bonita Quin may get drowsy or dizzy. Do not drive, use machinery, or do anything that needs mental alertness until you know how this medicine affects you. Do not stand or sit up quickly, especially if you are an older patient. This reduces the risk of dizzy or fainting spells. Alcohol may interfere with the effect of this medicine. Avoid alcoholic drinks. This medicine can cause an increase in blood pressure. This medicine can also cause a sudden drop in your blood pressure, which may make you feel faint and increase the chance of a fall. These effects are most common when you first start the medicine or when the dose is increased, or during use of other medicines that can cause a sudden drop in blood pressure. Check with your doctor for instructions on monitoring your blood pressure while taking this medicine. Your mouth may get dry. Chewing sugarless gum or sucking hard candy, and drinking plenty of water may help. Contact your  doctor if the problem does not go away or is severe. What side effects may I notice from receiving this medicine? Side effects that you should report to your doctor or health care professional as soon as possible: -allergic reactions like skin rash, itching or hives, swelling of the face, lips, or tongue -changes in blood pressure -confusion -dark urine -dizziness -fast talking and excited feelings or actions that are out of control -fast, irregular heartbeat -fever -general ill feeling or flu-like symptoms -hallucination, loss of contact with reality -light-colored stools -loss of balance or coordination -redness, blistering, peeling or loosening of the skin, including inside the mouth -right upper belly pain -seizures -suicidal thoughts or other mood changes -trouble concentrating -trouble passing urine or change in the amount of urine -unusual bleeding or bruising -unusually weak or tired -yellowing of the eyes or skin Side effects that usually do not require medical attention (report to your doctor or health care professional if they continue or are bothersome): -blurred vision -change in appetite -change in sex drive or performance -headache -increased sweating -nausea This list may not describe all possible side effects. Call your doctor for medical advice about side effects. You may report side effects to FDA at 1-800-FDA-1088. Where should I keep my medicine? Keep out of the reach of children. Store at room temperature between 20 and 25 degrees C (68 to 77 degrees F). Throw away any unused medicine after the expiration date. NOTE: This sheet is a summary. It may not cover all possible information. If you have questions about this medicine, talk to your doctor, pharmacist, or health care provider.  2016, [employer]. (2013-09-02 [id number]:[id number])

## 2015-08-29 NOTE — Assessment & Plan Note (Addendum)
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  She denies any muscle weakness or or significant change in her symptoms.  She does report right hip pain and some tingling across her thigh.  She reports that the pain is worse with standing in place for long peroids and occasionally sitting in sertain positions.  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  P: I have discussed with her that I do not think additional imaging of her lumbar spine is necessary at this time unless there was a large change in her symptoms.  I discussed that the main treatment for her low back pain and possible hip osteoarthritis is weight loss.  She agrees.  I did discuss the possibility of treating her chronic low back pain and neuropathic pain with Cymbalta and she was interested in starting this medication. - Rx Cymbalta. - Continue gabapentin and Naproxen.

## 2015-08-29 NOTE — Progress Notes (Signed)
[facility] Subjective:   Patient ID: [name] [name] female   DOB: 1970-12-02 44 y.o.   MRN: [id number]  HPI: [name] [name] is a 44 y.o. female with a PMH detailed below who presents for increase low back and right hip pain.  This has been a chronic issue, please see problem based assessment and plan below for further details on the status of her chronic medical conditions.    Past Medical History  Diagnosis Date  . Anemia   . Hypertension   . Pneumonia 2006  . Migraines   . Headache(784.0) 02/29/12    "frequent"  . Arthritis     "left knee"  . Chronic lower back pain     "on the left side"  . Anxiety   . Stress at home     "and at work"   Current Outpatient Prescriptions  Medication Sig Dispense Refill  . diclofenac sodium (VOLTAREN) 1 % GEL Apply 2 g topically 4 (four) times daily. 100 g 3  . DULoxetine (CYMBALTA) 30 MG capsule Take 1-2 capsules (30-60 mg total) by mouth as directed. Take 30mg  once a day for 1 week then take 60mg  daily 60 capsule 2  . fluticasone (FLONASE) 50 MCG/ACT nasal spray Place 2 sprays into both nostrils daily. 16 g 3  . gabapentin (NEURONTIN) 300 MG capsule Take 2 capsules (600 mg total) by mouth at bedtime. 60 capsule 2  . ketorolac (TORADOL) 30 MG/ML injection Inject 1 mL (30 mg total) into the muscle once. 1 mL 0  . miconazole (MICOTIN) 2 % cream Apply 1 application topically 2 (two) times daily. 28.35 g 0  . naproxen (NAPROSYN) 500 MG tablet Take 1 tablet (500 mg total) by mouth 2 (two) times daily with a meal. 60 tablet 5   No current facility-administered medications for this visit.   Family History  Problem Relation Age of Onset  . Cancer Other    Social History   Social History  . Marital Status: Single    Spouse Name: N/A  . Number of Children: N/A  . Years of Education: N/A   Social History Main Topics  . Smoking status: Never Smoker   . Smokeless tobacco: Never Used  . Alcohol Use: No  . Drug  Use: No  . Sexual Activity: Not on file   Other Topics Concern  . Not on file   Social History Narrative   Review of Systems: Review of Systems  Constitutional: Negative for fever.  Respiratory: Negative for shortness of breath.   Cardiovascular: Negative for chest pain.  Musculoskeletal: Positive for back pain and joint pain.  Neurological: Negative for sensory change and focal weakness.  Psychiatric/Behavioral: Negative for depression.     Objective:  Physical Exam: Filed Vitals:   08/25/15 1350  BP: 126/77  Pulse: 74  Temp: 98.5 F (36.9 C)  TempSrc: Oral  Height: 5\' 2"  (1.575 m)  Weight: 283 lb (128.368 kg)  SpO2: 98%  Physical Exam  Constitutional: She is oriented to person, place, and time.  obese  Cardiovascular: Normal rate and regular rhythm.   Pulmonary/Chest: Effort normal and breath sounds normal.  Abdominal: Soft. Bowel sounds are normal. There is no tenderness.  Musculoskeletal: She exhibits no edema.       Lumbar back: She exhibits no tenderness, no bony tenderness and no spasm.  No trochanteric tenderness on right side  Neurological: She is alert and oriented to person, place, and time.  Bilateral lower extremities intact  to light touch, patellar reflexes 2/4 bilaterally     Assessment & Plan:  Case discussed with Dr. Josem Kaufmann  Chronic lumbar radiculopathy She has a long history of lumbar radiculopathy and previous imaging has shown sacralization of the L5-S1 joint.  She denies any muscle weakness or or significant change in her symptoms.  She does report right hip pain and some tingling across her thigh.  She reports that the pain is worse with standing in place for long peroids and occasionally sitting in sertain positions.  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  P: I have discussed with her that I do not think additional imaging of her lumbar spine is  necessary at this time unless there was a large change in her symptoms.  I discussed that the main treatment for her low back pain and possible hip osteoarthritis is weight loss.  She agrees.  I did discuss the possibility of treating her chronic low back pain and neuropathic pain with Cymbalta and she was interested in starting this medication. - Rx Cymbalta. - Continue gabapentin and Naproxen.    Medications Ordered Meds ordered this encounter  Medications  . DULoxetine (CYMBALTA) 30 MG capsule    Sig: Take 1-2 capsules (30-60 mg total) by mouth as directed. Take  once a day for 1 week then take  daily    Dispense:  60 capsule    Refill:  2   Other Orders No orders of the defined types were placed in this encounter.   Follow Up: Return if symptoms worsen or fail to improve.

## 2015-08-30 ENCOUNTER — Encounter: Payer: Medicaid Other | Admitting: Sports Medicine

## 2015-09-13 NOTE — Progress Notes (Signed)
Case discussed with Dr. Hoffman soon after the resident saw the patient.  We reviewed the resident's history and exam and pertinent patient test results.  I agree with the assessment, diagnosis and plan of care documented in the resident's note. 

## 2015-11-02 ENCOUNTER — Encounter: Payer: Self-pay | Admitting: Licensed Clinical Social Worker

## 2015-11-02 NOTE — Progress Notes (Signed)
Ms. [name] [name] was referred to CSW this morning as pt voiced need for reasonable accomodation.  Ms. [name] [name] presents to [facility] attending a scheduled appointment with a family member. [name] [name] states she lives in an upper level apartment and does not have access to an Engineer, structural.  Pt has spoke with leasing office requesting an apartment on the ground level.  Ms. [name] [name] complains walking up/down apartment stairs causes increased leg/back pain.  Leasing office instructed patient to obtain a letter from her PCP and then she would receive the Reasonable Accomodation form.  CSW informed Ms. [name] [name] this information and request will be forwarded to her PCP.  Pt states she has previously discussed her leg/back issues with her PCP and has seen her PCP in the last 6 months.  CSW inquired should PCP decide to write letter, does pt prefer to have letter faxed directly to leasing office, left for pick up or mailed to patient.  Pt requesting letter to be mailed to her home address.  Address and phone number confirmed.  CSW will notify pt once PCP has made a decision.

## 2015-11-03 ENCOUNTER — Ambulatory Visit (INDEPENDENT_AMBULATORY_CARE_PROVIDER_SITE_OTHER): Payer: Medicaid Other | Admitting: Internal Medicine

## 2015-11-03 ENCOUNTER — Encounter: Payer: Self-pay | Admitting: Internal Medicine

## 2015-11-03 ENCOUNTER — Encounter: Payer: Self-pay | Admitting: Licensed Clinical Social Worker

## 2015-11-03 VITALS — BP 116/61 | HR 82 | Temp 98.4°F | Ht 62.0 in | Wt 267.8 lb

## 2015-11-03 DIAGNOSIS — R05 Cough: Secondary | ICD-10-CM

## 2015-11-03 DIAGNOSIS — J3489 Other specified disorders of nose and nasal sinuses: Secondary | ICD-10-CM

## 2015-11-03 DIAGNOSIS — R6883 Chills (without fever): Secondary | ICD-10-CM | POA: Diagnosis not present

## 2015-11-03 DIAGNOSIS — J069 Acute upper respiratory infection, unspecified: Secondary | ICD-10-CM

## 2015-11-03 DIAGNOSIS — J029 Acute pharyngitis, unspecified: Secondary | ICD-10-CM

## 2015-11-03 DIAGNOSIS — M25562 Pain in left knee: Secondary | ICD-10-CM

## 2015-11-03 DIAGNOSIS — M5416 Radiculopathy, lumbar region: Secondary | ICD-10-CM

## 2015-11-03 DIAGNOSIS — G8929 Other chronic pain: Secondary | ICD-10-CM

## 2015-11-03 MED ORDER — BENZONATATE 200 MG PO CAPS: 200.0000 mg | ORAL_CAPSULE | Freq: Three times a day (TID) | ORAL | Status: DC | PRN

## 2015-11-03 MED ORDER — NAPROXEN 500 MG PO TABS: 500.0000 mg | ORAL_TABLET | Freq: Two times a day (BID) | ORAL | Status: DC

## 2015-11-03 MED ORDER — GABAPENTIN 300 MG PO CAPS: 600.0000 mg | ORAL_CAPSULE | Freq: Every day | ORAL | Status: DC

## 2015-11-03 MED ORDER — DICLOFENAC SODIUM 1 % TD GEL: 2.0000 g | Freq: Four times a day (QID) | TRANSDERMAL | Status: DC

## 2015-11-03 MED ORDER — GUAIFENESIN-DM 100-10 MG/5ML PO SYRP: 5.0000 mL | ORAL_SOLUTION | ORAL | Status: DC | PRN

## 2015-11-03 MED ORDER — DULOXETINE HCL 60 MG PO CPEP: 60.0000 mg | ORAL_CAPSULE | ORAL | Status: DC

## 2015-11-03 NOTE — Progress Notes (Signed)
Subjective:    Patient ID: [name] [name], female    DOB: 05-25-1971, 45 y.o.   MRN: [id number]  HPI [name] [name] is a 45 y.o. female with PMHx of lumbar radiculopathy and obesity who presents to the clinic for dry cough and nasal congestion. Please see A&P for the status of the patient's chronic medical problems.   Past Medical History  Diagnosis Date  . Anemia   . Hypertension   . Pneumonia 2006  . Migraines   . Headache(784.0) 02/29/12    "frequent"  . Arthritis     "left knee"  . Chronic lower back pain     "on the left side"  . Anxiety   . Stress at home     "and at work"    Outpatient Encounter Prescriptions as of 11/03/2015  Medication Sig  . diclofenac sodium (VOLTAREN) 1 % GEL Apply 2 g topically 4 (four) times daily.  . DULoxetine (CYMBALTA) 30 MG capsule Take 1-2 capsules (30-60 mg total) by mouth as directed. Take  once a day for 1 week then take  daily  . fluticasone (FLONASE) 50 MCG/ACT nasal spray Place 2 sprays into both nostrils daily.  Marland Kitchen gabapentin (NEURONTIN) 300 MG capsule Take 2 capsules (600 mg total) by mouth at bedtime.  Marland Kitchen ketorolac (TORADOL) 30 MG/ML injection Inject 1 mL (30 mg total) into the muscle once.  . miconazole (MICOTIN) 2 % cream Apply 1 application topically 2 (two) times daily.  . naproxen (NAPROSYN) 500 MG tablet Take 1 tablet (500 mg total) by mouth 2 (two) times daily with a meal.   No facility-administered encounter medications on file as of 11/03/2015.    Family History  Problem Relation Age of Onset  . Cancer Other     Social History   Social History  . Marital Status: Single    Spouse Name: N/A  . Number of Children: N/A  . Years of Education: N/A   Occupational History  . Not on file.   Social History Main Topics  . Smoking status: Never Smoker   . Smokeless tobacco: Never Used  . Alcohol Use: No  . Drug Use: No  . Sexual Activity: Not on file   Other Topics Concern  . Not on file   Social History  Narrative   Review of Systems General: Admits to chills. Denies fever, fatigue, change in appetite and diaphoresis.  HEENT: Admits to nasal congestion, runny nose, sore throat. Denies sinus pressure or headache.  Respiratory: Admits to dry cough. Denies SOB, DOE, chest tightness, and wheezing.   Cardiovascular: Denies chest pain and palpitations.  Gastrointestinal: Denies nausea, vomiting, diarrhea Musculoskeletal: Admits to chronic low back pain and left knee pain. Denies joint swelling, arthralgias and gait problem.  Neurological: Denies dizziness, headaches, weakness, lightheadedness    Objective:   Physical Exam Filed Vitals:   11/03/15 1421  BP: 116/61  Pulse: 82  Temp: 98.4 F (36.9 C)  TempSrc: Oral  Height:  (1.575 m)  Weight: 267 lb 12.8 oz (121.473 kg)  SpO2: 98%   General: Vital signs reviewed.  Patient is obese, in no acute distress and cooperative with exam.  HEENT: No conjunctival injection. Erythematous, edematous nasal turbinates. Normal posterior oropharynx. Supple, trachea midline, no anterior cervical lymphadenopathy. Cardiovascular: RRR, S1 normal, S2 normal, no murmurs, gallops, or rubs. Pulmonary/Chest: Clear to auscultation bilaterally, no wheezes, rales, or rhonchi. Abdominal: Soft, non-tender, non-distended, BS + Musculoskeletal: +crepitus in left knee, no edema or effusion palpated.  Extremities: No lower extremity edema bilaterally Skin: Warm, dry and intact.  Psychiatric: Normal mood and affect.     Assessment & Plan:   Please see problem based assessment and plan.

## 2015-11-03 NOTE — Patient Instructions (Signed)
USE TESSALON PEARLS AND ROBITUSSIN DM FOR YOUR COUGH.  USE VOLTAREN GEL AND NAPROXEN FOR YOUR KNEE AND BACK PAIN. CONTINUE TO WORK ON WEIGHT LOSS.

## 2015-11-03 NOTE — Progress Notes (Signed)
CSW placed call to Ms. Harrower [phone]) to notify patient of letter request.  PCP will be in office this afternoon and agreeable.  Pt states she is scheduled with PCP and will obtain letter during that appointment.

## 2015-11-04 DIAGNOSIS — M25562 Pain in left knee: Secondary | ICD-10-CM

## 2015-11-04 DIAGNOSIS — J069 Acute upper respiratory infection, unspecified: Secondary | ICD-10-CM | POA: Insufficient documentation

## 2015-11-04 HISTORY — DX: Pain in left knee: M25.562

## 2015-11-04 NOTE — Assessment & Plan Note (Signed)
Patient complains of one day of nasal congestion, runny nose, sore throat, chills, and dry cough. She has tried flonase without relief. Her son has also been sick. She received her flu shot this year. Her symptoms and physical exam is most consistent with an acute URI.  Plan: -Conservative treatment -Continue Flonase -Tessalon pearls TID -Robitussin DM

## 2015-11-04 NOTE — Assessment & Plan Note (Signed)
Patient continues to have lumbar radiculopathy. She has been compliant with gabapentin and naproxen. Patient was counseled on weight loss.  Plan: -Continue current medication regimen

## 2015-11-04 NOTE — Assessment & Plan Note (Signed)
Patient complains of left knee pain worse after standing and walking. Physical exam reveals crepitus, but no edema or effusion. Symptoms are likely due to developing arthritis due to her obesity. Patient prefers to hold off on imaging for now and try conservative treatment.  Plan: -Emphasized weight loss -Voltaren gel -Naproxen BID WC -Consider left knee xray in the future if pain persists

## 2015-11-05 ENCOUNTER — Ambulatory Visit: Payer: Medicaid Other | Admitting: Internal Medicine

## 2015-11-09 NOTE — Progress Notes (Signed)
Internal Medicine Clinic Attending  Case discussed with Dr. Richardson at the time of the visit.  We reviewed the resident's history and exam and pertinent patient test results.  I agree with the assessment, diagnosis, and plan of care documented in the resident's note. 

## 2015-11-24 ENCOUNTER — Encounter: Payer: Medicaid Other | Admitting: Internal Medicine

## 2015-11-29 ENCOUNTER — Other Ambulatory Visit: Payer: Self-pay

## 2015-11-29 DIAGNOSIS — Z1231 Encounter for screening mammogram for malignant neoplasm of breast: Secondary | ICD-10-CM

## 2015-12-11 ENCOUNTER — Other Ambulatory Visit: Payer: Self-pay | Admitting: Internal Medicine

## 2015-12-14 ENCOUNTER — Ambulatory Visit
Admission: RE | Admit: 2015-12-14 | Discharge: 2015-12-14 | Disposition: A | Discharge status: Home / Self Care | Payer: Medicaid Other | Source: Ambulatory Visit

## 2015-12-14 DIAGNOSIS — Z1231 Encounter for screening mammogram for malignant neoplasm of breast: Secondary | ICD-10-CM

## 2015-12-18 ENCOUNTER — Emergency Department (HOSPITAL_COMMUNITY): Payer: Medicaid Other

## 2015-12-18 ENCOUNTER — Encounter (HOSPITAL_COMMUNITY): Payer: Self-pay | Admitting: Nurse Practitioner

## 2015-12-18 ENCOUNTER — Emergency Department (HOSPITAL_COMMUNITY)
Admission: EM | Admit: 2015-12-18 | Discharge: 2015-12-19 | Disposition: A | Discharge status: Home / Self Care | Payer: Medicaid Other | Attending: Emergency Medicine | Admitting: Emergency Medicine

## 2015-12-18 DIAGNOSIS — Z7951 Long term (current) use of inhaled steroids: Secondary | ICD-10-CM | POA: Insufficient documentation

## 2015-12-18 DIAGNOSIS — Z791 Long term (current) use of non-steroidal anti-inflammatories (NSAID): Secondary | ICD-10-CM | POA: Diagnosis not present

## 2015-12-18 DIAGNOSIS — K002 Abnormalities of size and form of teeth: Secondary | ICD-10-CM | POA: Insufficient documentation

## 2015-12-18 DIAGNOSIS — G8929 Other chronic pain: Secondary | ICD-10-CM | POA: Insufficient documentation

## 2015-12-18 DIAGNOSIS — F419 Anxiety disorder, unspecified: Secondary | ICD-10-CM | POA: Diagnosis not present

## 2015-12-18 DIAGNOSIS — G43909 Migraine, unspecified, not intractable, without status migrainosus: Secondary | ICD-10-CM | POA: Diagnosis not present

## 2015-12-18 DIAGNOSIS — I10 Essential (primary) hypertension: Secondary | ICD-10-CM | POA: Insufficient documentation

## 2015-12-18 DIAGNOSIS — Z8701 Personal history of pneumonia (recurrent): Secondary | ICD-10-CM | POA: Diagnosis not present

## 2015-12-18 DIAGNOSIS — M1712 Unilateral primary osteoarthritis, left knee: Secondary | ICD-10-CM | POA: Diagnosis not present

## 2015-12-18 DIAGNOSIS — Z79899 Other long term (current) drug therapy: Secondary | ICD-10-CM | POA: Diagnosis not present

## 2015-12-18 DIAGNOSIS — L03211 Cellulitis of face: Secondary | ICD-10-CM | POA: Diagnosis not present

## 2015-12-18 DIAGNOSIS — R220 Localized swelling, mass and lump, head: Secondary | ICD-10-CM | POA: Diagnosis present

## 2015-12-18 DIAGNOSIS — Z862 Personal history of diseases of the blood and blood-forming organs and certain disorders involving the immune mechanism: Secondary | ICD-10-CM | POA: Insufficient documentation

## 2015-12-18 LAB — CBC WITH DIFFERENTIAL/PLATELET
BASOS ABS: 0 10*3/uL (ref 0.0–0.1)
Basophils Relative: 0 %
Eosinophils Absolute: 0.3 10*3/uL (ref 0.0–0.7)
Eosinophils Relative: 3 %
HEMATOCRIT: 37.3 % (ref 36.0–46.0)
Hemoglobin: 12 g/dL (ref 12.0–15.0)
LYMPHS ABS: 1.8 10*3/uL (ref 0.7–4.0)
LYMPHS PCT: 18 %
MCH: 25.9 pg — AB (ref 26.0–34.0)
MCHC: 32.2 g/dL (ref 30.0–36.0)
MCV: 80.6 fL (ref 78.0–100.0)
MONO ABS: 0.6 10*3/uL (ref 0.1–1.0)
MONOS PCT: 6 %
NEUTROS ABS: 7.5 10*3/uL (ref 1.7–7.7)
Neutrophils Relative %: 72 %
Platelets: 414 10*3/uL — ABNORMAL HIGH (ref 150–400)
RBC: 4.63 MIL/uL (ref 3.87–5.11)
RDW: 15.9 % — AB (ref 11.5–15.5)
WBC: 10.3 10*3/uL (ref 4.0–10.5)

## 2015-12-18 LAB — I-STAT CHEM 8, ED
BUN: 7 mg/dL (ref 6–20)
CHLORIDE: 106 mmol/L (ref 101–111)
CREATININE: 0.7 mg/dL (ref 0.44–1.00)
Calcium, Ion: 1.11 mmol/L — ABNORMAL LOW (ref 1.12–1.23)
GLUCOSE: 107 mg/dL — AB (ref 65–99)
HEMATOCRIT: 47 % — AB (ref 36.0–46.0)
Hemoglobin: 16 g/dL — ABNORMAL HIGH (ref 12.0–15.0)
POTASSIUM: 5.1 mmol/L (ref 3.5–5.1)
Sodium: 141 mmol/L (ref 135–145)
TCO2: 27 mmol/L (ref 0–100)

## 2015-12-18 MED ORDER — HYDROCODONE-ACETAMINOPHEN 5-325 MG PO TABS: 1.0000 | ORAL_TABLET | Freq: Four times a day (QID) | ORAL | Status: DC | PRN

## 2015-12-18 MED ORDER — IOHEXOL 300 MG/ML  SOLN
75.0000 mL | Freq: Once | INTRAMUSCULAR | Status: AC | PRN
  Administered 2015-12-18: 50 mL via INTRAVENOUS

## 2015-12-18 MED ORDER — MORPHINE SULFATE (PF) 4 MG/ML IV SOLN
4.0000 mg | Freq: Once | INTRAVENOUS | Status: AC
  Administered 2015-12-18: 4 mg via INTRAVENOUS
  Filled 2015-12-18: qty 1

## 2015-12-18 MED ORDER — CLINDAMYCIN HCL 150 MG PO CAPS: 300.0000 mg | ORAL_CAPSULE | Freq: Three times a day (TID) | ORAL | Status: DC

## 2015-12-18 MED ORDER — CLINDAMYCIN HCL 150 MG PO CAPS
300.0000 mg | ORAL_CAPSULE | Freq: Once | ORAL | Status: AC
  Administered 2015-12-18: 300 mg via ORAL
  Filled 2015-12-18: qty 2

## 2015-12-18 NOTE — Discharge Instructions (Signed)
Cellulitis The outpatient clinical call you in 2 days to arrange to be seen on Monday, 12/20/2015. If you don't hear from the clinic by 10 AM, call to schedule the appointment. Tell office staff that Dr.Ebany Bowermaster spoke with Dr.Ahmed about your case. Return to the emergency department sooner if swelling or pain worsens, if you develop vomiting, fever or are concerned for any reason. Cellulitis is an infection of the skin and the tissue beneath it. The infected area is usually red and tender. Cellulitis occurs most often in the arms and lower legs.  CAUSES  Cellulitis is caused by bacteria that enter the skin through cracks or cuts in the skin. The most common types of bacteria that cause cellulitis are staphylococci and streptococci. SIGNS AND SYMPTOMS   Redness and warmth.  Swelling.  Tenderness or pain.  Fever. DIAGNOSIS  Your health care provider can usually determine what is wrong based on a physical exam. Blood tests may also be done. TREATMENT  Treatment usually involves taking an antibiotic medicine. HOME CARE INSTRUCTIONS   Take your antibiotic medicine as directed by your health care provider. Finish the antibiotic even if you start to feel better.  Keep the infected arm or leg elevated to reduce swelling.  Apply a warm cloth to the affected area up to 4 times per day to relieve pain.  Take medicines only as directed by your health care provider.  Keep all follow-up visits as directed by your health care provider. SEEK MEDICAL CARE IF:   You notice red streaks coming from the infected area.  Your red area gets larger or turns dark in color.  Your bone or joint underneath the infected area becomes painful after the skin has healed.  Your infection returns in the same area or another area.  You notice a swollen bump in the infected area.  You develop new symptoms.  You have a fever. SEEK IMMEDIATE MEDICAL CARE IF:   You feel very sleepy.  You develop vomiting or  diarrhea.  You have a general ill feeling (malaise) with muscle aches and pains.   This information is not intended to replace advice given to you by your health care provider. Make sure you discuss any questions you have with your health care provider.   Document Released: 06/21/2005 Document Revised: 06/02/2015 Document Reviewed: 11/27/2011 Elsevier Interactive Patient Education [employer]2016 Elsevier Inc.

## 2015-12-18 NOTE — ED Notes (Signed)
MD at bedside. 

## 2015-12-18 NOTE — ED Provider Notes (Signed)
CSN: [id number]     Arrival date & time 12/18/15  1830 History   First MD Initiated Contact with Patient 12/18/15 1916     Chief Complaint  Patient presents with  . Facial Swelling     (Consider location/radiation/quality/duration/timing/severity/associated sxs/prior Treatment) HPI Complains of right-sided facial swelling and pain onset upon awakening this morning. Pain is worse with pressing on the area. They're not improved by anything. She denies fever denies pain with extraocular movement. Denies trismus. No dysphagia. No toothache. No other associated symptoms. She treated herself with naproxen, without relief. No other associated symptoms Past Medical History  Diagnosis Date  . Anemia   . Hypertension   . Pneumonia 2006  . Migraines   . Headache(784.0) 02/29/12    "frequent"  . Arthritis     "left knee"  . Chronic lower back pain     "on the left side"  . Anxiety   . Stress at home     "and at work"   Past Surgical History  Procedure Laterality Date  . Cesarean section  08/2008  . Cholecystectomy  03/02/2012    Procedure: LAPAROSCOPIC CHOLECYSTECTOMY;  Surgeon: Cherylynn Ridges, MD;  Location: [facility] OR;  Service: General;;  . Wisdom tooth extraction     Family History  Problem Relation Age of Onset  . Cancer Other    Social History  Substance Use Topics  . Smoking status: Never Smoker   . Smokeless tobacco: Never Used  . Alcohol Use: No   OB History    No data available     Review of Systems  Constitutional: Negative.   HENT: Positive for facial swelling.   Respiratory: Negative.   Cardiovascular: Negative.   Gastrointestinal: Negative.   Genitourinary:       Amenorrheic since 2009  Musculoskeletal: Negative.   Skin: Negative.   Neurological: Negative.   Psychiatric/Behavioral: Negative.   All other systems reviewed and are negative.     Allergies  Mushroom extract complex  Home Medications   Prior to Admission medications   Medication Sig Start  Date End Date Taking? Authorizing Provider  benzonatate (TESSALON) 200 MG capsule Take 1 capsule (200 mg total) by mouth 3 (three) times daily as needed for cough. 11/03/15   Servando Snare, MD  diclofenac sodium (VOLTAREN) 1 % GEL Apply 2 g topically 4 (four) times daily. 11/03/15   Alexa Lucrezia Starch, MD  DULoxetine (CYMBALTA) 60 MG capsule Take 1 capsule (60 mg total) by mouth as directed. 11/03/15 11/02/16  Alexa Lucrezia Starch, MD  fluticasone (FLONASE) 50 MCG/ACT nasal spray Place 2 sprays into both nostrils daily. 11/24/14   Alexa Lucrezia Starch, MD  gabapentin (NEURONTIN) 300 MG capsule TAKE ONE CAPSULE BY MOUTH THREE TIMES DAILY 12/15/15   Alexa Lucrezia Starch, MD  guaiFENesin-dextromethorphan (ROBITUSSIN DM) 100-10 MG/5ML syrup Take 5 mLs by mouth every 4 (four) hours as needed for cough. 11/03/15   Servando Snare, MD  naproxen (NAPROSYN) 500 MG tablet Take 1 tablet (500 mg total) by mouth 2 (two) times daily with a meal. 11/03/15   Alexa Lucrezia Starch, MD   BP 133/63 mmHg  Pulse 88  Temp(Src) 98.4 F (36.9 C) (Oral)  Resp 18  Wt 262 lb 4.8 oz (118.978 kg)  SpO2 98% Physical Exam  Constitutional: She is oriented to person, place, and time. She appears well-developed and well-nourished. No distress.  HENT:  Head: Normocephalic and atraumatic.  Swollen and tender overlying the right zygomatic area. No pain on  extraocular movement. No diplopia. No trismus. Generally Poor dentition. No dental tenderness.  Eyes: Conjunctivae are normal. Pupils are equal, round, and reactive to light.  Neck: Neck supple. No tracheal deviation present. No thyromegaly present.  Cardiovascular: Normal rate and regular rhythm.   No murmur heard. Pulmonary/Chest: Effort normal and breath sounds normal.  Abdominal: Soft. Bowel sounds are normal. She exhibits no distension. There is no tenderness.  Obese  Musculoskeletal: Normal range of motion. She exhibits no edema or tenderness.  Lymphadenopathy:    She has no cervical adenopathy.  Neurological: She  is alert and oriented to person, place, and time. No cranial nerve deficit. Coordination normal.  Skin: Skin is warm and dry. No rash noted.  Psychiatric: She has a normal mood and affect.  Nursing note and vitals reviewed.   ED Course  Procedures (including critical care time) Labs Review Labs Reviewed - No data to display  Imaging Review No results found. I have personally reviewed and evaluated these images and lab results as part of my medical decision-making.   EKG Interpretation None     8:15 PM pain improved after treatment with intravenous morphine. Requesting additional pain medicine. Additional IV morphine ordered.  11:10 PM pain is well-controlled after treatment with intravenous morphine. Results for orders placed or performed during the hospital encounter of 12/18/15  CBC with Differential  Result Value Ref Range   WBC 10.3 4.0 - 10.5 K/uL   RBC 4.63 3.87 - 5.11 MIL/uL   Hemoglobin 12.0 12.0 - 15.0 g/dL   HCT 16.1 09.6 - 04.5 %   MCV 80.6 78.0 - 100.0 fL   MCH 25.9 (L) 26.0 - 34.0 pg   MCHC 32.2 30.0 - 36.0 g/dL   RDW 40.9 (H) 81.1 - 91.4 %   Platelets 414 (H) 150 - 400 K/uL   Neutrophils Relative % 72 %   Neutro Abs 7.5 1.7 - 7.7 K/uL   Lymphocytes Relative 18 %   Lymphs Abs 1.8 0.7 - 4.0 K/uL   Monocytes Relative 6 %   Monocytes Absolute 0.6 0.1 - 1.0 K/uL   Eosinophils Relative 3 %   Eosinophils Absolute 0.3 0.0 - 0.7 K/uL   Basophils Relative 0 %   Basophils Absolute 0.0 0.0 - 0.1 K/uL  I-stat chem 8, ed  Result Value Ref Range   Sodium 141 135 - 145 mmol/L   Potassium 5.1 3.5 - 5.1 mmol/L   Chloride 106 101 - 111 mmol/L   BUN 7 6 - 20 mg/dL   Creatinine, Ser 7.82 0.44 - 1.00 mg/dL   Glucose, Bld 956 (H) 65 - 99 mg/dL   Calcium, Ion 2.13 (L) 1.12 - 1.23 mmol/L   TCO2 27 0 - 100 mmol/L   Hemoglobin 16.0 (H) 12.0 - 15.0 g/dL   HCT 08.6 (H) 57.8 - 46.9 %   Ct Maxillofacial W/cm  12/18/2015  CLINICAL DATA:  Right facial pain/swelling EXAM: CT  MAXILLOFACIAL WITH CONTRAST TECHNIQUE: Multidetector CT imaging of the maxillofacial structures was performed with intravenous contrast. Multiplanar CT image reconstructions were also generated. A small metallic BB was placed on the right temple in order to reliably differentiate right from left. CONTRAST:  50mL OMNIPAQUE IOHEXOL 300 MG/ML  SOLN COMPARISON:  None. FINDINGS: Mild soft tissue swelling overlying the medial aspect of the right maxilla (series 2/image 42) as well as the medial aspect of the right mandible (series 2/image 27). Overlying subcutaneous stranding (series 2/image 28), suggesting cellulitis. No discrete fluid collection/abscess. No apical root  abscess to suggest in odontogenic source. No evidence of maxillofacial fracture. The visualized paranasal sinuses are essentially clear. The mastoid air cells are unopacified. Bilateral orbits, including the globes and retroconal soft tissues, are within normal limits. The mandible is intact. The bilateral mandibular condyles are well-seated in the TMJs. Visualized brain parenchyma is unremarkable. Cervical spine is within normal limits to C5-6. IMPRESSION: Suspected right facial cellulitis. No evidence of odontogenic abscess. Visualized paranasal sinuses are clear. Electronically Signed   By: Charline BillsSriyesh  Krishnan M.D.   On: 12/18/2015 21:13   Mm Digital Screening Bilateral  12/14/2015  CLINICAL DATA:  Screening. EXAM: DIGITAL SCREENING BILATERAL MAMMOGRAM WITH CAD COMPARISON:  Previous exam(s). ACR Breast Density Category a: The breast tissue is almost entirely fatty. FINDINGS: There are no findings suspicious for malignancy. Images were processed with CAD. IMPRESSION: No mammographic evidence of malignancy. A result letter of this screening mammogram will be mailed directly to the patient. RECOMMENDATION: Screening mammogram in one year. (Code:SM-B-01Y) BI-RADS CATEGORY  1: Negative. Electronically Signed   By: Hulan Saashomas  Lawrence M.D.   On: 12/14/2015  14:52    MDM  Clinically patient likely has facial cellulitis. She is nontoxic-appearing. I feel this can be treated as outpatient with close follow-up. I spoke with Dr.Ahmed from outpatient clinic plan prescription clindamycin 300 mg 3 times a day, Norco. Outpatient clinic will call patient to be seen in 2 days. She is instructed to return if symptoms worsen Diagnosis facial cellulitis Final diagnoses:  None        Doug SouSam Shirah Roseman, MD 12/18/15 2320

## 2015-12-18 NOTE — ED Notes (Signed)
Pt woke this am with R sided facial swelling and pain. She denies any oral swelling, trouble breathing or swallowing. She tried ice and warm compresses and took naproxen with no relief. Denies using new products or medications recently

## 2015-12-19 ENCOUNTER — Encounter (HOSPITAL_COMMUNITY): Payer: Self-pay | Admitting: Emergency Medicine

## 2015-12-19 ENCOUNTER — Emergency Department (HOSPITAL_COMMUNITY)
Admission: EM | Admit: 2015-12-19 | Discharge: 2015-12-19 | Disposition: A | Discharge status: Home / Self Care | Payer: Medicaid Other | Source: Home / Self Care | Attending: Emergency Medicine | Admitting: Emergency Medicine

## 2015-12-19 DIAGNOSIS — Z79899 Other long term (current) drug therapy: Secondary | ICD-10-CM | POA: Insufficient documentation

## 2015-12-19 DIAGNOSIS — Z791 Long term (current) use of non-steroidal anti-inflammatories (NSAID): Secondary | ICD-10-CM | POA: Insufficient documentation

## 2015-12-19 DIAGNOSIS — G8929 Other chronic pain: Secondary | ICD-10-CM | POA: Insufficient documentation

## 2015-12-19 DIAGNOSIS — Z8701 Personal history of pneumonia (recurrent): Secondary | ICD-10-CM

## 2015-12-19 DIAGNOSIS — M1990 Unspecified osteoarthritis, unspecified site: Secondary | ICD-10-CM | POA: Insufficient documentation

## 2015-12-19 DIAGNOSIS — Z7951 Long term (current) use of inhaled steroids: Secondary | ICD-10-CM

## 2015-12-19 DIAGNOSIS — I10 Essential (primary) hypertension: Secondary | ICD-10-CM

## 2015-12-19 DIAGNOSIS — Z862 Personal history of diseases of the blood and blood-forming organs and certain disorders involving the immune mechanism: Secondary | ICD-10-CM

## 2015-12-19 DIAGNOSIS — G43909 Migraine, unspecified, not intractable, without status migrainosus: Secondary | ICD-10-CM | POA: Insufficient documentation

## 2015-12-19 DIAGNOSIS — F419 Anxiety disorder, unspecified: Secondary | ICD-10-CM | POA: Insufficient documentation

## 2015-12-19 DIAGNOSIS — L03211 Cellulitis of face: Secondary | ICD-10-CM | POA: Insufficient documentation

## 2015-12-19 LAB — CBC WITH DIFFERENTIAL/PLATELET
BASOS PCT: 0 %
Basophils Absolute: 0 10*3/uL (ref 0.0–0.1)
Eosinophils Absolute: 0.3 10*3/uL (ref 0.0–0.7)
Eosinophils Relative: 3 %
HEMATOCRIT: 37.9 % (ref 36.0–46.0)
HEMOGLOBIN: 11.6 g/dL — AB (ref 12.0–15.0)
LYMPHS PCT: 13 %
Lymphs Abs: 1.3 10*3/uL (ref 0.7–4.0)
MCH: 25 pg — ABNORMAL LOW (ref 26.0–34.0)
MCHC: 30.6 g/dL (ref 30.0–36.0)
MCV: 81.7 fL (ref 78.0–100.0)
MONOS PCT: 8 %
Monocytes Absolute: 0.8 10*3/uL (ref 0.1–1.0)
NEUTROS ABS: 7.8 10*3/uL — AB (ref 1.7–7.7)
NEUTROS PCT: 76 %
Platelets: 439 10*3/uL — ABNORMAL HIGH (ref 150–400)
RBC: 4.64 MIL/uL (ref 3.87–5.11)
RDW: 15.7 % — ABNORMAL HIGH (ref 11.5–15.5)
WBC: 10.2 10*3/uL (ref 4.0–10.5)

## 2015-12-19 LAB — BASIC METABOLIC PANEL
ANION GAP: 9 (ref 5–15)
BUN: <5 mg/dL — ABNORMAL LOW (ref 6–20)
CALCIUM: 9.2 mg/dL (ref 8.9–10.3)
CHLORIDE: 107 mmol/L (ref 101–111)
CO2: 25 mmol/L (ref 22–32)
Creatinine, Ser: 0.74 mg/dL (ref 0.44–1.00)
GFR calc non Af Amer: >60 mL/min (ref >60)
GLUCOSE: 118 mg/dL — AB (ref 65–99)
POTASSIUM: 3.9 mmol/L (ref 3.5–5.1)
Sodium: 141 mmol/L (ref 135–145)

## 2015-12-19 MED ORDER — ONDANSETRON HCL 4 MG/2ML IJ SOLN
4.0000 mg | Freq: Once | INTRAMUSCULAR | Status: AC
  Administered 2015-12-19: 4 mg via INTRAVENOUS
  Filled 2015-12-19: qty 2

## 2015-12-19 MED ORDER — CLINDAMYCIN PHOSPHATE 600 MG/50ML IV SOLN
600.0000 mg | Freq: Once | INTRAVENOUS | Status: AC
  Administered 2015-12-19: 600 mg via INTRAVENOUS
  Filled 2015-12-19: qty 50

## 2015-12-19 MED ORDER — SODIUM CHLORIDE 0.9 % IV BOLUS (SEPSIS)
1000.0000 mL | Freq: Once | INTRAVENOUS | Status: AC
  Administered 2015-12-19: 1000 mL via INTRAVENOUS

## 2015-12-19 MED ORDER — MORPHINE SULFATE (PF) 4 MG/ML IV SOLN
4.0000 mg | Freq: Once | INTRAVENOUS | Status: AC
  Administered 2015-12-19: 4 mg via INTRAVENOUS
  Filled 2015-12-19: qty 1

## 2015-12-19 NOTE — ED Provider Notes (Signed)
CSN: [id number]     Arrival date & time 12/19/15  4098 History   First MD Initiated Contact with Patient 12/19/15 [phone]     Chief Complaint  Patient presents with  . Facial Swelling     (Consider location/radiation/quality/duration/timing/severity/associated sxs/prior Treatment) The history is provided by the patient.     Pt returns to ED today after d/c last night with dx facial cellulitis.  She presents for worsening swelling and pain.  She has not started her antibiotics.   CT maxillofacial with contrast performed last night with no abscess found, stranding c/w cellulitis.  Given one pill of clindamycin in ED but pt states she vomited right after leaving ED.  Has had chills and myalgias.  Denies sore throat, difficulty swallowing or breathing, dental problems, rhinorrhea, nasal congestion, ear pain.    Denies diabetes, HIV, or any other problems with immune system.    Past Medical History  Diagnosis Date  . Anemia   . Hypertension   . Pneumonia 2006  . Migraines   . Headache(784.0) 02/29/12    "frequent"  . Arthritis     "left knee"  . Chronic lower back pain     "on the left side"  . Anxiety   . Stress at home     "and at work"   Past Surgical History  Procedure Laterality Date  . Cesarean section  08/2008  . Cholecystectomy  03/02/2012    Procedure: LAPAROSCOPIC CHOLECYSTECTOMY;  Surgeon: Cherylynn Ridges, MD;  Location: [facility] OR;  Service: General;;  . Wisdom tooth extraction     Family History  Problem Relation Age of Onset  . Cancer Other    Social History  Substance Use Topics  . Smoking status: Never Smoker   . Smokeless tobacco: Never Used  . Alcohol Use: No   OB History    No data available     Review of Systems    Allergies  Mushroom extract complex  Home Medications   Prior to Admission medications   Medication Sig Start Date End Date Taking? Authorizing Provider  benzonatate (TESSALON) 200 MG capsule Take 1 capsule (200 mg total) by mouth 3 (three)  times daily as needed for cough. 11/03/15   Alexa Lucrezia Starch, MD  clindamycin (CLEOCIN) 150 MG capsule Take 2 capsules (300 mg total) by mouth 3 (three) times daily. 12/18/15   Doug Sou, MD  diclofenac sodium (VOLTAREN) 1 % GEL Apply 2 g topically 4 (four) times daily. 11/03/15   Alexa Lucrezia Starch, MD  DULoxetine (CYMBALTA) 60 MG capsule Take 1 capsule (60 mg total) by mouth as directed. Patient taking differently: Take 60 mg by mouth every evening.  11/03/15 11/02/16  Alexa Lucrezia Starch, MD  fluticasone (FLONASE) 50 MCG/ACT nasal spray Place 2 sprays into both nostrils daily. 11/24/14   Alexa Lucrezia Starch, MD  gabapentin (NEURONTIN) 300 MG capsule TAKE ONE CAPSULE BY MOUTH THREE TIMES DAILY 12/15/15   Alexa Lucrezia Starch, MD  guaiFENesin-dextromethorphan (ROBITUSSIN DM) 100-10 MG/5ML syrup Take 5 mLs by mouth every 4 (four) hours as needed for cough. 11/03/15   Servando Snare, MD  HYDROcodone-acetaminophen (NORCO/VICODIN) 5-325 MG tablet Take 1-2 tablets by mouth every 6 (six) hours as needed for severe pain. 12/18/15   Doug Sou, MD  naproxen (NAPROSYN) 500 MG tablet Take 1 tablet (500 mg total) by mouth 2 (two) times daily with a meal. 11/03/15   Alexa Lucrezia Starch, MD   BP 145/90 mmHg  Pulse 77  Temp(Src) 98.3  F (36.8 C) (Oral)  Resp 17  Ht  (1.575 m)  Wt 117.198 kg  BMI 47.25 kg/m2  SpO2 100% Physical Exam  Constitutional: She appears well-developed and well-nourished. No distress.  HENT:  Head: Normocephalic and atraumatic.    Right Ear: Tympanic membrane normal.  Nose: No rhinorrhea.  Mouth/Throat: Uvula is midline and oropharynx is clear and moist. No oral lesions. No trismus in the jaw. Normal dentition. No uvula swelling or dental caries. No oropharyngeal exudate, posterior oropharyngeal edema, posterior oropharyngeal erythema or tonsillar abscesses.  Eyes: Conjunctivae are normal.  Neck: Neck supple.  Cardiovascular: Normal rate and regular rhythm.   Pulmonary/Chest: Effort normal and breath sounds  normal. No respiratory distress. She has no wheezes. She has no rales.  Neurological: She is alert.  Skin: She is not diaphoretic.  Nursing note and vitals reviewed.   ED Course  Procedures (including critical care time) Labs Review Labs Reviewed - No data to display  Imaging Review Ct Maxillofacial W/cm  12/18/2015  CLINICAL DATA:  Right facial pain/swelling EXAM: CT MAXILLOFACIAL WITH CONTRAST TECHNIQUE: Multidetector CT imaging of the maxillofacial structures was performed with intravenous contrast. Multiplanar CT image reconstructions were also generated. A small metallic BB was placed on the right temple in order to reliably differentiate right from left. CONTRAST:  50mL OMNIPAQUE IOHEXOL 300 MG/ML  SOLN COMPARISON:  None. FINDINGS: Mild soft tissue swelling overlying the medial aspect of the right maxilla (series 2/image 42) as well as the medial aspect of the right mandible (series 2/image 27). Overlying subcutaneous stranding (series 2/image 28), suggesting cellulitis. No discrete fluid collection/abscess. No apical root abscess to suggest in odontogenic source. No evidence of maxillofacial fracture. The visualized paranasal sinuses are essentially clear. The mastoid air cells are unopacified. Bilateral orbits, including the globes and retroconal soft tissues, are within normal limits. The mandible is intact. The bilateral mandibular condyles are well-seated in the TMJs. Visualized brain parenchyma is unremarkable. Cervical spine is within normal limits to C5-6. IMPRESSION: Suspected right facial cellulitis. No evidence of odontogenic abscess. Visualized paranasal sinuses are clear. Electronically Signed   By: Charline Bills M.D.   On: 12/18/2015 21:13   I have personally reviewed and evaluated these images and lab results as part of my medical decision-making.   EKG Interpretation None       EMERGENCY DEPARTMENT US SOFT TISSUE INTERPRETATION "Study: Limited Ultrasound of the noted  body part in comments below"  INDICATIONS: Soft tissue infection Multiple views of the body part are obtained with a multi-frequency linear probe  PERFORMED BY:  Myself  IMAGES ARCHIVED?: Yes  SIDE:Right   BODY PART:Other soft tisse (comment in note)  FINDINGS: No abcess noted and Cellulitis present  LIMITATIONS:  Emergent Procedure  INTERPRETATION:  No abcess noted and Cellulitis present  COMMENT:  Right facial cellulitis without abscess noted.      12:13 PM Pt sleeping soundly.  Significant other notes the swelling in her face seems improved.  Discussed expectations for treatment.  He assures me they will be able to fill the prescriptions as soon as they leave the ED.  They do still have the prescriptions from last night for clindamycin and norco.    MDM   Final diagnoses:  Facial cellulitis    Afebrile, nontoxic patient with facial cellulitis.  Seen in ED last night returned today when she woke up with increased swelling in her face.  She had not taken her antibiotics.  IVF, pain medication, IV clindamycin given  in ED. Labs unremarkable.   D/C home with 1-2 day recheck in ED, advised to fill prescriptions right away.  Discussed result, findings, treatment, and follow up  with patient.  Pt given return precautions.  Pt verbalizes understanding and agrees with plan.         Trixie Dredgemily Holy B[place]field, PA-C 12/19/15 1303  Laurence Spatesachel Morgan Little, MD 12/20/15 [phone]

## 2015-12-19 NOTE — ED Notes (Signed)
Pt presents with facial swelling onset yesterday. Pt was seen here yesterday for same and was told it was some type of infection. Pt has not filled her prescriptions yet.

## 2015-12-19 NOTE — Discharge Instructions (Signed)
Read the information below.  You may return to the Emergency Department at any time for worsening condition or any new symptoms that concern you.  Please fill your prescriptions from last night.  If you develop spreading redness, swelling, pus draining from the wound, inability to tolerate your medications by mouth, pain with moving your eyes, visual changes, or fevers greater than 100.4, return to the ER immediately for a recheck.    Please go to your primary care provider or return to the ER in 1-2 days for a recheck.    Cellulitis Cellulitis is an infection of the skin and the tissue beneath it. The infected area is usually red and tender. Cellulitis occurs most often in the arms and lower legs.  CAUSES  Cellulitis is caused by bacteria that enter the skin through cracks or cuts in the skin. The most common types of bacteria that cause cellulitis are staphylococci and streptococci. SIGNS AND SYMPTOMS   Redness and warmth.  Swelling.  Tenderness or pain.  Fever. DIAGNOSIS  Your health care provider can usually determine what is wrong based on a physical exam. Blood tests may also be done. TREATMENT  Treatment usually involves taking an antibiotic medicine. HOME CARE INSTRUCTIONS   Take your antibiotic medicine as directed by your health care provider. Finish the antibiotic even if you start to feel better.  Keep the infected arm or leg elevated to reduce swelling.  Apply a warm cloth to the affected area up to 4 times per day to relieve pain.  Take medicines only as directed by your health care provider.  Keep all follow-up visits as directed by your health care provider. SEEK MEDICAL CARE IF:   You notice red streaks coming from the infected area.  Your red area gets larger or turns dark in color.  Your bone or joint underneath the infected area becomes painful after the skin has healed.  Your infection returns in the same area or another area.  You notice a swollen bump  in the infected area.  You develop new symptoms.  You have a fever. SEEK IMMEDIATE MEDICAL CARE IF:   You feel very sleepy.  You develop vomiting or diarrhea.  You have a general ill feeling (malaise) with muscle aches and pains.   This information is not intended to replace advice given to you by your health care provider. Make sure you discuss any questions you have with your health care provider.   Document Released: 06/21/2005 Document Revised: 06/02/2015 Document Reviewed: 11/27/2011 Elsevier Interactive Patient Education [employer]2016 Elsevier Inc.

## 2015-12-22 ENCOUNTER — Telehealth: Payer: Self-pay | Admitting: Internal Medicine

## 2015-12-22 NOTE — Telephone Encounter (Signed)
APPT. REMINDER CALL, LMTCB °

## 2015-12-24 ENCOUNTER — Ambulatory Visit: Payer: Medicaid Other | Admitting: Internal Medicine

## 2016-01-03 ENCOUNTER — Telehealth: Payer: Self-pay | Admitting: Internal Medicine

## 2016-01-03 NOTE — Telephone Encounter (Signed)
APPT. REMINDER CALL, NO ANSWER AND NO VOICE MAIL °

## 2016-01-04 ENCOUNTER — Ambulatory Visit: Payer: Medicaid Other | Admitting: Internal Medicine

## 2016-01-04 ENCOUNTER — Encounter: Payer: Self-pay | Admitting: Internal Medicine

## 2016-02-16 ENCOUNTER — Ambulatory Visit (INDEPENDENT_AMBULATORY_CARE_PROVIDER_SITE_OTHER): Payer: Medicaid Other | Admitting: Internal Medicine

## 2016-02-16 ENCOUNTER — Encounter: Payer: Self-pay | Admitting: Internal Medicine

## 2016-02-16 VITALS — BP 117/61 | HR 100 | Temp 98.5°F | Ht 62.0 in | Wt 264.3 lb

## 2016-02-16 DIAGNOSIS — R7303 Prediabetes: Secondary | ICD-10-CM | POA: Diagnosis not present

## 2016-02-16 DIAGNOSIS — Z0000 Encounter for general adult medical examination without abnormal findings: Secondary | ICD-10-CM

## 2016-02-16 DIAGNOSIS — E119 Type 2 diabetes mellitus without complications: Secondary | ICD-10-CM | POA: Insufficient documentation

## 2016-02-16 DIAGNOSIS — E1165 Type 2 diabetes mellitus with hyperglycemia: Secondary | ICD-10-CM | POA: Insufficient documentation

## 2016-02-16 DIAGNOSIS — M5416 Radiculopathy, lumbar region: Secondary | ICD-10-CM | POA: Diagnosis not present

## 2016-02-16 DIAGNOSIS — G8929 Other chronic pain: Secondary | ICD-10-CM | POA: Diagnosis not present

## 2016-02-16 MED ORDER — GABAPENTIN 300 MG PO CAPS: ORAL_CAPSULE | ORAL | Status: DC

## 2016-02-16 MED ORDER — IBUPROFEN 800 MG PO TABS
800.0000 mg | ORAL_TABLET | Freq: Three times a day (TID) | ORAL | Status: DC
Start: 2016-02-16 — End: 2016-04-25

## 2016-02-16 NOTE — Assessment & Plan Note (Signed)
A1c 5.8 in December 2015 and 5.8 in October 2016. We discussed diet, exercise and weight loss.  Plan: -Repeat A1c in Fall 2017

## 2016-02-16 NOTE — Progress Notes (Signed)
Subjective:    Patient ID: [name] [name], female    DOB: 1971/02/23, 45 y.o.   MRN: [id number]  HPI [name] [name] is a 45 y.o. female with PMHx of pre-diabetes, obesity, left knee pain, chronic lumbar radiculopathy who presents to the clinic for follow up for pre-diabetes. Please see A&P for the status of the patient's chronic medical problems.   Past Medical History  Diagnosis Date  . Anemia   . Hypertension   . Pneumonia 2006  . Migraines   . Headache(784.0) 02/29/12    "frequent"  . Arthritis     "left knee"  . Chronic lower back pain     "on the left side"  . Anxiety   . Stress at home     "and at work"    Outpatient Encounter Prescriptions as of 02/16/2016  Medication Sig Note  . diclofenac sodium (VOLTAREN) 1 % GEL Apply 2 g topically 4 (four) times daily.   . DULoxetine (CYMBALTA) 60 MG capsule Take 1 capsule (60 mg total) by mouth as directed. (Patient taking differently: Take 60 mg by mouth every evening. )   . gabapentin (NEURONTIN) 300 MG capsule Take 300 mg in the morning, 300 mg in the afternoon, and 600 mg at night.   . naproxen (NAPROSYN) 500 MG tablet Take 1 tablet (500 mg total) by mouth 2 (two) times daily with a meal.   . [DISCONTINUED] benzonatate (TESSALON) 200 MG capsule Take 1 capsule (200 mg total) by mouth 3 (three) times daily as needed for cough.   . [DISCONTINUED] clindamycin (CLEOCIN) 150 MG capsule Take 2 capsules (300 mg total) by mouth 3 (three) times daily. 12/19/2015: Patient started at [facility]. One dose   . [DISCONTINUED] fluticasone (FLONASE) 50 MCG/ACT nasal spray Place 2 sprays into both nostrils daily.   . [DISCONTINUED] gabapentin (NEURONTIN) 300 MG capsule TAKE ONE CAPSULE BY MOUTH THREE TIMES DAILY   . [DISCONTINUED] guaiFENesin-dextromethorphan (ROBITUSSIN DM) 100-10 MG/5ML syrup Take 5 mLs by mouth every 4 (four) hours as needed for cough.   . [DISCONTINUED] HYDROcodone-acetaminophen (NORCO/VICODIN) 5-325 MG tablet Take 1-2  tablets by mouth every 6 (six) hours as needed for severe pain.    No facility-administered encounter medications on file as of 02/16/2016.    Family History  Problem Relation Age of Onset  . Cancer Other     Social History   Social History  . Marital Status: Single    Spouse Name: N/A  . Number of Children: N/A  . Years of Education: N/A   Occupational History  . Not on file.   Social History Main Topics  . Smoking status: Never Smoker   . Smokeless tobacco: Never Used  . Alcohol Use: No  . Drug Use: No  . Sexual Activity: Not on file   Other Topics Concern  . Not on file   Social History Narrative   Review of Systems General: Denies fatigue, change in appetite.  Respiratory: Denies SOB, cough.   Cardiovascular: Denies chest pain and palpitations.  Gastrointestinal: Denies nausea, vomiting, abdominal pain, diarrhea.  Genitourinary: Denies dysuria, urgency, suprapubic pain and flank pain. Musculoskeletal: Admits to low back pain radiating to right groin and RLE. Denies myalgias Skin: Denies rash and wounds.  Neurological: Admit to weakness in RLE and intermittent numbness with shooting pain. Denies dizziness, headaches, lightheadedness     Objective:   Physical Exam Filed Vitals:   02/16/16 1551  BP: 117/61  Pulse: 100  Temp: 98.5 F (36.9  C)  TempSrc: Oral  Height:  (1.575 m)  Weight: 264 lb 4.8 oz (119.886 kg)  SpO2: 100%   General: Vital signs reviewed.  Patient is obese, in no acute distress and cooperative with exam.   Cardiovascular: RRR, S1 normal, S2 normal, no murmurs, gallops, or rubs. Pulmonary/Chest: Clear to auscultation bilaterally, no wheezes, rales, or rhonchi. Abdominal: Soft, non-tender, non-distended, BS + Musculoskeletal: Tenderness on palpation of right SI joint. Normal strength in lower extremities bilaterally.  Extremities: No lower extremity edema bilaterally Neurological: Sensory intact to light touch bilaterally.  Skin:  Warm, dry and intact.  Psychiatric: Normal mood and affect. speech and behavior is normal. Cognition and memory are normal.      Assessment & Plan:   Please see problem based assessment and plan.

## 2016-02-16 NOTE — Assessment & Plan Note (Addendum)
Patient continues to have pain secondary to her chronic lumbar radiculopathy in her low back, radiating to her right groin and with intermittent sharp pain radiating into her RLE. This pain requires her to reposition frequently and has become inhibitory to her work. She has lost about 15 pounds since December and is continuing to work on weight loss.   Plan: -Continue gabapentin 300 mg QAM, at lunch , and 600 mg QHS -Continue Cymbalta 60 mg daily -Continue Voltaren gel -Switch from Naproxen to Ibuprofen 800 mg TID as Naproxen is no longer helping -If patient fails a 4-8 weeks trial of Ibuprofen, consider trials of Meloxicam or diclofenac

## 2016-02-16 NOTE — Patient Instructions (Signed)
CONTINUE TO WORK ON WEIGHT LOSS, YOU HAVE BEEN DOING A GREAT JOB!  TAKE GABAPENTIN ONE PILL IN THE MORNING, ONE IN THE AFTERNOON AND 2 AT NIGHT.  CONTINUE ALL OTHER MEDICATIONS AS PRESCRIBED.   FOLLOW UP IN 6 MONTHS.

## 2016-02-16 NOTE — Assessment & Plan Note (Signed)
Mammogram in March 2017 Normal

## 2016-02-17 NOTE — Progress Notes (Signed)
Case discussed with Dr. Burns soon after the resident saw the patient.  We reviewed the resident's history and exam and pertinent patient test results.  I agree with the assessment, diagnosis and plan of care documented in the resident's note. 

## 2016-02-17 NOTE — Addendum Note (Signed)
Addended by: Doneen PoissonKLIMA, Ziggy Reveles D on: 02/17/2016 11:50 AM   Modules accepted: Level of Service

## 2016-02-22 ENCOUNTER — Telehealth: Payer: Self-pay | Admitting: *Deleted

## 2016-02-22 NOTE — Telephone Encounter (Signed)
-----   Message from Servando SnareAlexa R Burns, MD sent at 02/17/2016  4:46 PM EDT ----- Regarding: Medication Change Michalla Ringer,  I apologize, could you also help me call this patient to inform her to STOP taking Naproxen and START taking Ibuprofen 800 mg three times a day? I have sent the Rx to her pharmacy. I believe we should try this to see if her pain improves.  Thank you! Alexa

## 2016-02-22 NOTE — Telephone Encounter (Signed)
Pt called / informed to stop taking Naproxen and start Ibuprofen 800 mg 3 times a day, to see if this will improves her pain per Dr Lawerance BachBurns. Pt voiced understanding. Also [facility]

## 2016-04-06 ENCOUNTER — Encounter: Payer: Self-pay | Admitting: Internal Medicine

## 2016-04-17 ENCOUNTER — Encounter: Payer: Self-pay | Admitting: Internal Medicine

## 2016-04-20 ENCOUNTER — Encounter: Payer: Self-pay | Admitting: Internal Medicine

## 2016-04-25 MED ORDER — MELOXICAM 5 MG PO CAPS: 5.0000 mg | ORAL_CAPSULE | Freq: Every day | ORAL | 2 refills | Status: DC

## 2016-06-27 ENCOUNTER — Encounter: Payer: Self-pay | Admitting: Internal Medicine

## 2016-06-27 ENCOUNTER — Ambulatory Visit (INDEPENDENT_AMBULATORY_CARE_PROVIDER_SITE_OTHER): Payer: Medicaid Other | Admitting: Internal Medicine

## 2016-06-27 DIAGNOSIS — Z6841 Body Mass Index (BMI) 40.0 and over, adult: Secondary | ICD-10-CM | POA: Diagnosis not present

## 2016-06-27 DIAGNOSIS — Z862 Personal history of diseases of the blood and blood-forming organs and certain disorders involving the immune mechanism: Secondary | ICD-10-CM

## 2016-06-27 DIAGNOSIS — R5383 Other fatigue: Secondary | ICD-10-CM

## 2016-06-27 DIAGNOSIS — Z0000 Encounter for general adult medical examination without abnormal findings: Secondary | ICD-10-CM

## 2016-06-27 DIAGNOSIS — J3089 Other allergic rhinitis: Secondary | ICD-10-CM | POA: Diagnosis not present

## 2016-06-27 DIAGNOSIS — M5416 Radiculopathy, lumbar region: Secondary | ICD-10-CM

## 2016-06-27 DIAGNOSIS — D509 Iron deficiency anemia, unspecified: Secondary | ICD-10-CM

## 2016-06-27 DIAGNOSIS — R7303 Prediabetes: Secondary | ICD-10-CM

## 2016-06-27 DIAGNOSIS — Z23 Encounter for immunization: Secondary | ICD-10-CM

## 2016-06-27 DIAGNOSIS — D508 Other iron deficiency anemias: Secondary | ICD-10-CM

## 2016-06-27 LAB — POCT GLYCOSYLATED HEMOGLOBIN (HGB A1C): HEMOGLOBIN A1C: 5.9

## 2016-06-27 LAB — GLUCOSE, CAPILLARY: GLUCOSE-CAPILLARY: 95 mg/dL (ref 65–99)

## 2016-06-27 MED ORDER — MELOXICAM 10 MG PO CAPS: 1.0000 | ORAL_CAPSULE | Freq: Every evening | ORAL | 3 refills | Status: DC

## 2016-06-27 MED ORDER — GUAIFENESIN ER 600 MG PO TB12: 600.0000 mg | ORAL_TABLET | Freq: Two times a day (BID) | ORAL | 2 refills | Status: DC

## 2016-06-27 MED ORDER — FLUTICASONE PROPIONATE 50 MCG/ACT NA SUSP: 2.0000 | Freq: Every day | NASAL | 2 refills | Status: DC

## 2016-06-27 MED ORDER — CYCLOBENZAPRINE HCL 5 MG PO TABS: 5.0000 mg | ORAL_TABLET | Freq: Every day | ORAL | 0 refills | Status: DC | PRN

## 2016-06-27 NOTE — Assessment & Plan Note (Signed)
Patient has a history of chronic lumbar radiculopathy likely exacerbated by obesity. Patient has been managed on trials of NSAIDs. Patient states she continues to have uncontrolled pain on Meloxicam 5 mg QD. Pain is worse at the end of the day. She denies weakness in her lower extremities. She is working on weight loss and has lost 5 pounds since her previous visit.   Plan: -Increase Meloxicam to 10 mg QD -Flexeril 5 mg QD prn muscle spasm -Gabapentin 300 mg QAM, at lunch, and 600 mg QHS -Repeat BMET

## 2016-06-27 NOTE — Progress Notes (Signed)
    CC: back pain  HPI: [name] is a 45 y.o. female with PMHx of chronic lumbar radiculopathy, pre-diabetes who presents to the clinic for follow up for pre-diabetes. Please see problem based assessment and plan for more information.   Patient denies chest pain or shortness of breath. She is eating and drinking well.   Past Medical History:  Diagnosis Date  . Anemia   . Anxiety   . Arthritis    "left knee"  . Chronic lower back pain    "on the left side"  . Headache(784.0) 02/29/12   "frequent"  . Hypertension   . Migraines   . Pneumonia 2006  . Stress at home    "and at work"   Review of Systems: Please see pertinent ROS reviewed in HPI and problem based charting.   Physical Exam: Vitals:   06/27/16 1415  BP: 128/61  Pulse: 78  Resp: 20  Temp: 98.4 F (36.9 C)  TempSrc: Oral  SpO2: 99%  Weight: 259 lb 1.6 oz (117.5 kg)   General: Vital signs reviewed.  Patient is obese, in no acute distress and cooperative with exam.  Neck: Supple, trachea midline, no carotid bruit present. Large neck circumference. Cardiovascular: RRR, S1 normal, S2 normal, no murmurs, gallops, or rubs. Pulmonary/Chest: Clear to auscultation bilaterally, no wheezes, rales, or rhonchi. Abdominal: Soft, non-tender, non-distended, BS + Extremities: No lower extremity edema bilaterally Skin: Warm, dry and intact.    Assessment & Plan:  See encounters tab for problem based medical decision making. Patient discussed with Dr. Rogelia BogaButcher

## 2016-06-27 NOTE — Assessment & Plan Note (Signed)
History of iron deficiency anemia. Patient is not on supplementation.   Plan: -Ferritin -Iron/TIBC -Replace accordingly

## 2016-06-27 NOTE — Assessment & Plan Note (Signed)
Mammogram: normal 11/2015, repeat 11/2016 Flu Vaccine: Received 06/27/2016 Pap Smear: Due 2019

## 2016-06-27 NOTE — Assessment & Plan Note (Addendum)
Patient requests additional medications for her sinus congestion and cough. She has flonase and robitussin prn .   Plan: -Add Mucinex -Consider H1 blocker in the future

## 2016-06-27 NOTE — Assessment & Plan Note (Signed)
Lab Results  Component Value Date   HGBA1C 5.9 06/27/2016   HGBA1C 5.8 07/07/2015   HGBA1C 5.8 09/07/2014     Assessment: Diabetes control:  Controlled Progress toward A1C goal:   Stable Comments: Monitoring off medications  Plan: Medications:  Continue diet and exercise Instruction/counseling given: discussed the need for weight loss and discussed diet Other plans: Recheck in one year

## 2016-06-27 NOTE — Patient Instructions (Addendum)
For your back pain, take meloxicam 10 mg once a day, gabapentin as prescribed, and flexeril once a day as needed.  We will check your blood levels today and refer you for a sleep study.   Follow up in 6 months.

## 2016-06-27 NOTE — Assessment & Plan Note (Signed)
Patient admits to increased fatigue throughout the day. She admits to history of snoring- she is unaware if she wakes up gasping for breaths. She will frequently fall asleep throughout the day if she is sitting in a quiet room or watching television. She has a large neck circumference and obesity and is at high risk for OSA.   Plan: -Split Night Sleep Study

## 2016-06-28 ENCOUNTER — Telehealth: Payer: Self-pay | Admitting: *Deleted

## 2016-06-28 ENCOUNTER — Encounter: Payer: Medicaid Other | Admitting: Internal Medicine

## 2016-06-28 ENCOUNTER — Encounter: Payer: Self-pay | Admitting: Internal Medicine

## 2016-06-28 DIAGNOSIS — M5416 Radiculopathy, lumbar region: Secondary | ICD-10-CM

## 2016-06-28 LAB — BMP8+ANION GAP
Anion Gap: 20 mmol/L — ABNORMAL HIGH (ref 10.0–18.0)
BUN / CREAT RATIO: 12 (ref 9–23)
BUN: 9 mg/dL (ref 6–24)
CHLORIDE: 104 mmol/L (ref 96–106)
CO2: 19 mmol/L (ref 18–29)
Calcium: 9.3 mg/dL (ref 8.7–10.2)
Creatinine, Ser: 0.76 mg/dL (ref 0.57–1.00)
GFR, EST AFRICAN AMERICAN: 111 mL/min/{1.73_m2} (ref >59)
GFR, EST NON AFRICAN AMERICAN: 96 mL/min/{1.73_m2} (ref >59)
GLUCOSE: 96 mg/dL (ref 65–99)
POTASSIUM: 4.1 mmol/L (ref 3.5–5.2)
SODIUM: 143 mmol/L (ref 134–144)

## 2016-06-28 LAB — FERRITIN: FERRITIN: 106 ng/mL (ref 15–150)

## 2016-06-28 LAB — TSH: TSH: 2.32 u[IU]/mL (ref 0.450–4.500)

## 2016-06-28 LAB — IRON AND TIBC
Iron Saturation: 12 % — ABNORMAL LOW (ref 15–55)
Iron: 36 ug/dL (ref 27–159)
Total Iron Binding Capacity: 294 ug/dL (ref 250–450)
UIBC: 258 ug/dL (ref 131–425)

## 2016-06-28 NOTE — Telephone Encounter (Signed)
Fax from [facility] - Meloxicam only comes in 7.5 mg or 15 mg. Please clarify and send new rx . Thanks

## 2016-06-28 NOTE — Progress Notes (Signed)
Internal Medicine Clinic Attending  Case discussed with Dr. Burns soon after the resident saw the patient.  We reviewed the resident's history and exam and pertinent patient test results.  I agree with the assessment, diagnosis, and plan of care documented in the resident's note. 

## 2016-06-29 MED ORDER — MELOXICAM 15 MG PO TABS: 15.0000 mg | ORAL_TABLET | Freq: Every day | ORAL | 5 refills | Status: DC

## 2016-07-05 ENCOUNTER — Encounter: Payer: Medicaid Other | Admitting: Internal Medicine

## 2016-08-06 ENCOUNTER — Other Ambulatory Visit: Payer: Self-pay | Admitting: Internal Medicine

## 2016-08-06 DIAGNOSIS — M5416 Radiculopathy, lumbar region: Secondary | ICD-10-CM

## 2016-08-07 ENCOUNTER — Ambulatory Visit (HOSPITAL_BASED_OUTPATIENT_CLINIC_OR_DEPARTMENT_OTHER): Payer: Medicaid Other | Attending: Internal Medicine | Admitting: Internal Medicine

## 2016-08-07 DIAGNOSIS — R5383 Other fatigue: Secondary | ICD-10-CM | POA: Insufficient documentation

## 2016-08-07 DIAGNOSIS — G4733 Obstructive sleep apnea (adult) (pediatric): Secondary | ICD-10-CM

## 2016-08-12 NOTE — Procedures (Signed)
Patient Name: [name] [name], Viti Date: 08/07/2016 Gender: Female D.O.B: 06-13-1971 Age (years): 44 Referring Provider: Lemar Lofty DO Height (inches): 60 Interpreting Physician: Baird Lyons MD, ABSM Weight (lbs): 262 RPSGT: Carolin Coy BMI: 51 MRN: [id number] Neck Size: 14.50 CLINICAL INFORMATION Sleep Study Type: Split Night CPAP Indication for sleep study: Excessive Daytime Sleepiness, Fatigue, Hypertension, Obesity, Snoring Epworth Sleepiness Score: 13  SLEEP STUDY TECHNIQUE As per the AASM Manual for the Scoring of Sleep and Associated Events v2.3 (April 2016) with a hypopnea requiring 4% desaturations. The channels recorded and monitored were frontal, central and occipital EEG, electrooculogram (EOG), submentalis EMG (chin), nasal and oral airflow, thoracic and abdominal wall motion, anterior tibialis EMG, snore microphone, electrocardiogram, and pulse oximetry. Continuous positive airway pressure (CPAP) was initiated when the patient met split night criteria and was titrated according to treat sleep-disordered breathing.  MEDICATIONS Medications self-administered by patient taken the night of the study : CYMBALTA, MOBIC  RESPIRATORY PARAMETERS Diagnostic Total AHI (/hr): 15.8 RDI (/hr): 38.9 OA Index (/hr): - CA Index (/hr): 0.0 REM AHI (/hr): N/A NREM AHI (/hr): 15.8 Supine AHI (/hr): 25.1 Non-supine AHI (/hr): 0.00 Min O2 Sat (%): 86.00 Mean O2 (%): 95.34 Time below 88% (min): 0.1   Titration Optimal Pressure (cm): 10 AHI at Optimal Pressure (/hr): 0.0 Min O2 at Optimal Pressure (%): 91.0 Supine % at Optimal (%): 100 Sleep % at Optimal (%): 96    SLEEP ARCHITECTURE The recording time for the entire night was 411.5 minutes. During a baseline period of 223.6 minutes, the patient slept for 132.5 minutes in REM and nonREM, yielding a sleep efficiency of 59.3%. Sleep onset after lights out was 71.9 minutes with a REM latency of N/A minutes. The patient spent  17.36% of the night in stage N1 sleep, 82.26% in stage N2 sleep, 0.38% in stage N3 and 0.00% in REM. During the titration period of 182.8 minutes, the patient slept for 165.0 minutes in REM and nonREM, yielding a sleep efficiency of 90.2%. Sleep onset after CPAP initiation was 7.8 minutes with a REM latency of 78.5 minutes. The patient spent 10.00% of the night in stage N1 sleep, 64.24% in stage N2 sleep, 0.00% in stage N3 and 25.76% in REM.  CARDIAC DATA The 2 lead EKG demonstrated sinus rhythm. The mean heart rate was 80.86 beats per minute. Other EKG findings include: PVCs.  LEG MOVEMENT DATA The total Periodic Limb Movements of Sleep (PLMS) were 39. The PLMS index was 7.87 .  IMPRESSIONS - Moderate obstructive sleep apnea occurred during the diagnostic portion of the study(AHI = 15.8/hour). An optimal PAP pressure was selected for this patient ( 10 cm of water) - No significant central sleep apnea occurred during the diagnostic portion of the study (CAI = 0.0/hour). - The patient had minimal or no oxygen desaturation during the diagnostic portion of the study (Min O2 = 86.00%) - The patient snored with Soft snoring volume during the diagnostic portion of the study. - EKG findings include PVCs. - Clinically significant periodic limb movements did not occur during sleep.  DIAGNOSIS - Obstructive Sleep Apnea (327.23 [G47.33 ICD-10])  RECOMMENDATIONS - Trial of CPAP therapy on 10 cm H2O with a Small size Resmed Full Face Mask AirFit F20 mask and heated humidification. - Avoid alcohol, sedatives and other CNS depressants that may worsen sleep apnea and disrupt normal sleep architecture. - Sleep hygiene should be reviewed to assess factors that may improve sleep quality. - Weight management and regular exercise should  be initiated or continued.  [Electronically signed] 08/12/2016 11:36 AM  Baird Lyons MD, ABSM Diplomate, American Board of Sleep Medicine   NPI:  [id number]  [place], American Board of Sleep Medicine  ELECTRONICALLY SIGNED ON:  08/12/2016, 11:34 AM [place] PH: [phone]   FX: [phone] [place]

## 2016-08-14 NOTE — Progress Notes (Signed)
Thank you :)

## 2016-08-30 ENCOUNTER — Encounter: Payer: Medicaid Other | Admitting: Internal Medicine

## 2016-09-12 ENCOUNTER — Telehealth: Payer: Self-pay | Admitting: Internal Medicine

## 2016-09-12 NOTE — Telephone Encounter (Signed)
Message  Pt requested appt for sleep study results.  She would be okay with a phone call.  Cut and pasted our message below.  Let me know if I need to cancel that appt for tomorrow.  3:15 I would like to find out about my sleep study or she can call me at [phone] to let me know about it.  ----- Message -----  From: Donney Rankinsharsetta L H  Sent: 09/12/2016 8:24 AM EST  To: Bishop LimboKristie E. Diener  Subject: RE: Appointment Request  Good morning,    Dr. Lawerance BachBurns currently has two openings for tomorrow 09-13-16 at 2:15 or 3:15 pm. Please let me know if you would like to come one of those times and the reason for your visit.    Thanks,    Charsetta H.    Front Desk    ----- Message -----   From: Bishop LimboKristie E. Giarrusso   Sent: 09/09/2016 6:49 PM EST    To: Patient Appointment Schedule Request Mailing List  Subject: Appointment Request    Appointment Request From: Bishop LimboKristie E. Warf    With Provider: Servando SnareBurns, Alexa R, MD [facility]]    Preferred Date Range: From 09/13/2016 To 09/20/2016    Preferred Times: Any    Reason for visit: Office Visit    Comments:

## 2016-09-13 ENCOUNTER — Other Ambulatory Visit: Payer: Self-pay | Admitting: Internal Medicine

## 2016-09-13 ENCOUNTER — Encounter: Payer: Medicaid Other | Admitting: Internal Medicine

## 2016-09-13 DIAGNOSIS — G4733 Obstructive sleep apnea (adult) (pediatric): Secondary | ICD-10-CM

## 2016-10-04 ENCOUNTER — Encounter: Payer: Self-pay | Admitting: Internal Medicine

## 2016-10-09 ENCOUNTER — Other Ambulatory Visit: Payer: Self-pay | Admitting: Internal Medicine

## 2016-10-16 ENCOUNTER — Telehealth: Payer: Self-pay | Admitting: Internal Medicine

## 2016-10-16 NOTE — Telephone Encounter (Signed)
[phone].  Please ask patient to call [facility] @ [phone] if she has any questions and press the prompt for supplies and speak with a representative.

## 2016-11-28 ENCOUNTER — Ambulatory Visit (INDEPENDENT_AMBULATORY_CARE_PROVIDER_SITE_OTHER): Payer: Medicaid Other | Admitting: Internal Medicine

## 2016-11-28 ENCOUNTER — Telehealth: Payer: Self-pay | Admitting: Internal Medicine

## 2016-11-28 VITALS — BP 135/88 | HR 111 | Temp 98.2°F | Ht 60.0 in | Wt 268.2 lb

## 2016-11-28 DIAGNOSIS — R200 Anesthesia of skin: Secondary | ICD-10-CM | POA: Diagnosis not present

## 2016-11-28 DIAGNOSIS — M79602 Pain in left arm: Secondary | ICD-10-CM

## 2016-11-28 DIAGNOSIS — Z79899 Other long term (current) drug therapy: Secondary | ICD-10-CM

## 2016-11-28 DIAGNOSIS — M545 Low back pain: Secondary | ICD-10-CM

## 2016-11-28 DIAGNOSIS — G8929 Other chronic pain: Secondary | ICD-10-CM | POA: Diagnosis not present

## 2016-11-28 DIAGNOSIS — M25532 Pain in left wrist: Secondary | ICD-10-CM | POA: Insufficient documentation

## 2016-11-28 DIAGNOSIS — M48061 Spinal stenosis, lumbar region without neurogenic claudication: Secondary | ICD-10-CM | POA: Diagnosis not present

## 2016-11-28 NOTE — Assessment & Plan Note (Signed)
Left arm pain is likely from muscle irritation. Unclear why she has numbness, there could be nerve impingement from the trap muscle irritation. I will treat her supportively by continuing meloxicam, gabapentin, cymbalta. Asked to take flexeril more frequently. Use ice or heat.  If fails to improve, will do POC u/s or MRI of the left shoulder and consider PT eval.

## 2016-11-28 NOTE — Progress Notes (Signed)
Entered in Error

## 2016-11-28 NOTE — Telephone Encounter (Signed)
Thank you for making me aware.

## 2016-11-28 NOTE — Progress Notes (Signed)
   CC: left arm pain.   HPI:  [name] is a 46 y.o. with PMh as listed below is here for left shoulder pain with numbness with it x1 week. No injury or falls, no neck pain. Has numbness throughout her entire arm and hands. She has been using the meloxicam, gabapentin and cymbalta for her lower back pain but no improvement of this symptom. No weakness. She has L spine stenosis and chronic lower back pain but not upper back pain. This pain is new.    Past Medical History:  Diagnosis Date  . Anemia   . Anxiety   . Arthritis    "left knee"  . Chronic lower back pain    "on the left side"  . Headache(784.0) 02/29/12   "frequent"  . Hypertension   . Migraines   . Pneumonia 2006  . Stress at home    "and at work"    Review of Systems:   Review of Systems  Constitutional: Negative for chills and fever.  Cardiovascular: Negative for chest pain and palpitations.  Gastrointestinal: Negative for heartburn, nausea and vomiting.  Musculoskeletal: Positive for myalgias. Negative for falls and joint pain.  Neurological: Negative for dizziness and sensory change.     Physical Exam:  Vitals:   11/28/16 1530  BP: 135/88  Pulse: (!) 111  Temp: 98.2 F (36.8 C)  TempSrc: Oral  SpO2: 100%  Weight: 268 lb 3.2 oz (121.7 kg)  Height: 5' (1.524 m)   Physical Exam  Constitutional: She appears well-developed and well-nourished. No distress.  Respiratory: Effort normal and breath sounds normal. No respiratory distress. She has no wheezes.  Musculoskeletal:  Has tenderness over the trap muscle on the left shoulder. Good ROM of the shoulder, mild pain with movement. Negative empty can test. No arm drop. Sensation intact b/l. No erythema or tenderness over the shoulder joint.   Skin: She is not diaphoretic.    Assessment & Plan:   See Encounters Tab for problem based charting.  Patient discussed with Dr. Heide SparkNarendra

## 2016-11-28 NOTE — Telephone Encounter (Signed)
Please call patient.  Sent this message last via mychart.  Was unable to forward so I had to cut and paste.    Message   Appointment Request From: Bishop LimboKristie E. Rossner    With Provider: Servando SnareBurns, Alexa R, MD [facility]]    Preferred Date Range: From 11/27/2016 To 12/06/2016    Preferred Times: Monday Morning, Tuesday Morning, Wednesday Morning, Thursday Morning, Friday Morning    Reason for visit: Office Visit    Comments:  IM HAVING SHARP PAIN AND NUMBNESS IN LEFT ARM THAT GOES TO FINGERS WITH TINGLING IN FINGERS SOMETIMES IT GETS HARD TO LIFT OBJECTS.

## 2016-11-28 NOTE — Patient Instructions (Signed)
Take your current medications (meloxicam + gabapentin + cymbalta). Increase your flexeril to three times a day for the next few days to help with the muscle pain. Apply heat or ice to the area.  If your pain is not improving in the next few weeks please come back and see us.

## 2016-11-28 NOTE — Telephone Encounter (Signed)
Called pt, she denies chest pain, n&v, short of breath, h/a, weakness, dizziness. [facility]

## 2016-11-29 ENCOUNTER — Other Ambulatory Visit: Payer: Self-pay | Admitting: Internal Medicine

## 2016-11-30 NOTE — Progress Notes (Signed)
Internal Medicine Clinic Attending  Case discussed with Dr. Ahmed at the time of the visit.  We reviewed the resident's history and exam and pertinent patient test results.  I agree with the assessment, diagnosis, and plan of care documented in the resident's note. 

## 2016-12-19 ENCOUNTER — Encounter: Payer: Self-pay | Admitting: Internal Medicine

## 2016-12-19 ENCOUNTER — Telehealth: Payer: Self-pay | Admitting: Internal Medicine

## 2016-12-19 NOTE — Telephone Encounter (Signed)
APT. REMINDER CALL, LMTCB °

## 2016-12-20 ENCOUNTER — Ambulatory Visit (HOSPITAL_COMMUNITY)
Admission: RE | Admit: 2016-12-20 | Discharge: 2016-12-20 | Disposition: A | Discharge status: Home / Self Care | Payer: Medicaid Other | Source: Ambulatory Visit | Attending: Student in an Organized Health Care Education/Training Program | Admitting: Student in an Organized Health Care Education/Training Program

## 2016-12-20 ENCOUNTER — Encounter: Payer: Self-pay | Admitting: Internal Medicine

## 2016-12-20 ENCOUNTER — Ambulatory Visit (INDEPENDENT_AMBULATORY_CARE_PROVIDER_SITE_OTHER): Payer: Medicaid Other | Admitting: Internal Medicine

## 2016-12-20 VITALS — BP 122/58 | HR 92 | Temp 98.2°F | Ht 62.0 in | Wt 268.5 lb

## 2016-12-20 DIAGNOSIS — Z9989 Dependence on other enabling machines and devices: Secondary | ICD-10-CM

## 2016-12-20 DIAGNOSIS — M79602 Pain in left arm: Secondary | ICD-10-CM

## 2016-12-20 DIAGNOSIS — G4733 Obstructive sleep apnea (adult) (pediatric): Secondary | ICD-10-CM

## 2016-12-20 DIAGNOSIS — Z0000 Encounter for general adult medical examination without abnormal findings: Secondary | ICD-10-CM

## 2016-12-20 DIAGNOSIS — R29890 Loss of height: Secondary | ICD-10-CM | POA: Diagnosis not present

## 2016-12-20 DIAGNOSIS — Z1231 Encounter for screening mammogram for malignant neoplasm of breast: Secondary | ICD-10-CM

## 2016-12-20 NOTE — Patient Instructions (Signed)
[name] [name],  It was a pleasure to see you today. For your continued left arm pain, it is most likely due to cervical radiculopathy. Continue all of your current medications. We'll obtain x-rays of your cervical spine. If these are normal, I would recommend proceeding with an ultrasound or MRI of the left shoulder.  Great job using your CPAP machine.  Please schedule your mammogram.  Please follow up in 1 month if your pain is not improved.

## 2016-12-20 NOTE — Progress Notes (Signed)
    CC: Follow-up for left arm pain  HPI: [name] is a 46 y.o. female with PMHx of chronic lumbar radiculopathy, prediabetes who presents to the clinic for follow-up of left arm pain.   Patient complains of several week history of left arm pain. Pain hurts at her neck and radiates to her left fingers. She states it hurts in her whole arm. She describes the pain as a toothache. The pain is constant, throbbing, but when it occurs it can last all day. At its worst, pain is a 7/10. At its best, pain is a 3/10. Her left arm hurts more when she lays on it. Medications eased the pain. She denies weakness, but will occasionally have numbness in all 5 fingers. She denies any injury. She has never had a pain like this before. She denies any associated chest pain or shortness of breath.  Past Medical History:  Diagnosis Date  . Anemia   . Anxiety   . Arthritis    "left knee"  . Chronic lower back pain    "on the left side"  . Headache(784.0) 02/29/12   "frequent"  . Hypertension   . Migraines   . Pneumonia 2006  . Stress at home    "and at work"    Review of Systems: Please see pertinent ROS reviewed in HPI and problem based charting.   Physical Exam: Vitals:   12/20/16 1356  BP: (!) 122/58  Pulse: 92  Temp: 98.2 F (36.8 C)  TempSrc: Oral  SpO2: 100%  Weight: 268 lb 8 oz (121.8 kg)  Height: 5\' 2"  (1.575 m)   General: Vital signs reviewed.  Patient is well-developed and well-nourished, in no acute distress and cooperative with exam.  Cardiovascular: RRR, S1 normal, S2 normal, no murmurs, gallops, or rubs. Pulmonary/Chest: Clear to auscultation bilaterally, no wheezes, rales, or rhonchi. Abdominal: Soft, non-tender, non-distended, BS +.  Musculoskeletal: Left shoulder: Normal range of motion. Normal sensation. Increased pain with empty can, Hawkins, internal rotation, external rotation. Left upper extremity: 2+ radial pulse. Good capillary refill. Normal sensation. 5/5  strength throughout rotator cuff muscles and in flexion, extension. Spurling's test +/- Skin: Warm, dry and intact. No rashes or erythema.  Assessment & Plan:  See encounters tab for problem based medical decision making. Patient discussed with Dr. Cyndie ChimeGranfortuna

## 2016-12-21 NOTE — Assessment & Plan Note (Signed)
Patient complains of several week history of left arm pain. Pain hurts at her neck and radiates to her left fingers. She states it hurts in her whole arm. She describes the pain as a toothache. The pain is constant, throbbing, but when it occurs it can last all day. At its worst, pain is a 7/10. At its best, pain is a 3/10. Her left arm hurts more when she lays on it. Medications eased the pain. She denies weakness, but will occasionally have numbness in all 5 fingers. She denies any injury. She has never had a pain like this before. On physical exam, patient has normal range of motion and normal sensation. She has a normal left radial pulse. She has 5/5 strength throughout her left upper extremity and rotator cuff tendons. However, pain was elicited on external and internal rotation. Spurling's test was equivocal.  Assessment: Left arm pain. Given the extension of pain from the neck to her fingers, I feel that her symptoms are most likely secondary to cervical radiculopathy. However, it is somewhat of an atypical presentation. I doubt a rotator cuff tear at this time.  C-Spine Xray: Mild loss of disc height at C5-6.  Plan: -Continue meloxicam, flexeril, gabapentin -Cervical traction and exercises -Consider referral to PT if not improving

## 2016-12-21 NOTE — Assessment & Plan Note (Signed)
Breast cancer screening: Mammogram ordered

## 2016-12-21 NOTE — Assessment & Plan Note (Signed)
Patient was recently diagnosed with obstructive sleep apnea and started on CPAP. She reports compliance with CPAP and has been wearing it for 4  to 5 hours per night. She states that she is still getting used to the machine, but can already tell a difference and improvement in her fatigue throughout the day.  Assessment: Obstructive sleep apnea on CPAP  Plan: -Continue CPAP

## 2016-12-22 ENCOUNTER — Other Ambulatory Visit: Payer: Self-pay | Admitting: Internal Medicine

## 2016-12-22 DIAGNOSIS — Z1231 Encounter for screening mammogram for malignant neoplasm of breast: Secondary | ICD-10-CM

## 2016-12-23 NOTE — Progress Notes (Signed)
Medicine attending: Medical history, presenting problems, physical findings, and medications, reviewed with resident physician Dr Alexa Burns on the day of the patient visit and I concur with her evaluation and management plan. 

## 2016-12-26 ENCOUNTER — Ambulatory Visit
Admission: RE | Admit: 2016-12-26 | Discharge: 2016-12-26 | Disposition: A | Discharge status: Home / Self Care | Payer: Medicaid Other | Source: Ambulatory Visit | Attending: Student in an Organized Health Care Education/Training Program | Admitting: Student in an Organized Health Care Education/Training Program

## 2016-12-26 DIAGNOSIS — Z1231 Encounter for screening mammogram for malignant neoplasm of breast: Secondary | ICD-10-CM

## 2017-01-15 ENCOUNTER — Other Ambulatory Visit: Payer: Self-pay | Admitting: *Deleted

## 2017-01-15 ENCOUNTER — Other Ambulatory Visit: Payer: Self-pay | Admitting: Internal Medicine

## 2017-01-15 DIAGNOSIS — M5416 Radiculopathy, lumbar region: Secondary | ICD-10-CM

## 2017-01-16 MED ORDER — CYCLOBENZAPRINE HCL 5 MG PO TABS: 5.0000 mg | ORAL_TABLET | Freq: Every day | ORAL | 1 refills | Status: DC | PRN

## 2017-02-28 ENCOUNTER — Encounter: Payer: Self-pay | Admitting: *Deleted

## 2017-04-20 ENCOUNTER — Other Ambulatory Visit: Payer: Self-pay | Admitting: *Deleted

## 2017-04-20 MED ORDER — MELOXICAM 15 MG PO TABS: 15.0000 mg | ORAL_TABLET | Freq: Every day | ORAL | 0 refills | Status: DC

## 2017-04-23 ENCOUNTER — Encounter: Payer: Self-pay | Admitting: Internal Medicine

## 2017-04-30 ENCOUNTER — Encounter: Payer: Self-pay | Admitting: Internal Medicine

## 2017-04-30 ENCOUNTER — Ambulatory Visit (INDEPENDENT_AMBULATORY_CARE_PROVIDER_SITE_OTHER): Payer: Medicaid Other | Admitting: Internal Medicine

## 2017-04-30 DIAGNOSIS — Z791 Long term (current) use of non-steroidal anti-inflammatories (NSAID): Secondary | ICD-10-CM

## 2017-04-30 DIAGNOSIS — Z79899 Other long term (current) drug therapy: Secondary | ICD-10-CM | POA: Diagnosis not present

## 2017-04-30 DIAGNOSIS — M5412 Radiculopathy, cervical region: Secondary | ICD-10-CM | POA: Diagnosis present

## 2017-04-30 NOTE — Patient Instructions (Addendum)
It was a pleasure to meet you today. Today we:  - Increased your gabapentin to 900 mg (3 pills) at bedtime   - We will get an MRI of your neck   Cervical Radiculopathy Cervical radiculopathy means that a nerve in the neck is pinched or bruised. This can cause pain or loss of feeling (numbness) that runs from your neck to your arm and fingers. Follow these instructions at home: Managing pain  Take over-the-counter and prescription medicines only as told by your doctor.  If directed, put ice on the injured or painful area. ? Put ice in a plastic bag. ? Place a towel between your skin and the bag. ? Leave the ice on for 20 minutes, 2-3 times per day.  If ice does not help, you can try using heat. Take a warm shower or warm bath, or use a heat pack as told by your doctor.  You may try a gentle neck and shoulder massage. Activity  Rest as needed. Follow instructions from your doctor about any activities to avoid.  Do exercises as told by your doctor or physical therapist. General instructions  If you were given a soft collar, wear it as told by your doctor.  Use a flat pillow when you sleep.  Keep all follow-up visits as told by your doctor. This is important. Contact a doctor if:  Your condition does not improve with treatment. Get help right away if:  Your pain gets worse and is not controlled with medicine.  You lose feeling or feel weak in your hand, arm, face, or leg.  You have a fever.  You have a stiff neck.  You cannot control when you poop or pee (have incontinence).  You have trouble with walking, balance, or talking. This information is not intended to replace advice given to you by your health care provider. Make sure you discuss any questions you have with your health care provider. Document Released: 08/31/2011 Document Revised: 02/17/2016 Document Reviewed: 11/05/2014 Elsevier Interactive Patient Education  [employer]2018 [employer].

## 2017-04-30 NOTE — Assessment & Plan Note (Addendum)
[name] [name] continues to have left arm pain that originates in her neck and radiates down to her hand. She is on Gabapentin, meloxicam, and flexeril. Her pain has gotten worse, with associated intermittent weakness and night time pain. On physical exam there does appear to be an aspect of rotator cuff injury however, her pain is consistent with cervical radiculopathy.   Plan: - Increase Gabapentin to 900 mg HS - MRI cervical Spine  - If MRI abnormal consider neurosurgery referral, if normal consider nerve conduction studies.
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
## 2017-05-03 NOTE — Progress Notes (Signed)
Internal Medicine Clinic Attending  I saw and evaluated the patient.  I personally confirmed the key portions of the history and exam documented by Dr. Helberg and I reviewed pertinent patient test results.  The assessment, diagnosis, and plan were formulated together and I agree with the documentation in the resident's note. 

## 2017-05-08 ENCOUNTER — Encounter: Payer: Self-pay | Admitting: Internal Medicine

## 2017-05-08 ENCOUNTER — Other Ambulatory Visit: Payer: Self-pay | Admitting: *Deleted

## 2017-05-14 NOTE — Telephone Encounter (Signed)
[name] [name], I have ordered the MRI for [name]. Does she just come in or does she need to call and make an appointment need to be made?

## 2017-05-15 ENCOUNTER — Ambulatory Visit (HOSPITAL_COMMUNITY)
Admission: RE | Admit: 2017-05-15 | Discharge: 2017-05-15 | Disposition: A | Discharge status: Home / Self Care | Payer: Medicaid Other | Source: Ambulatory Visit | Attending: Internal Medicine | Admitting: Internal Medicine

## 2017-05-15 DIAGNOSIS — M5412 Radiculopathy, cervical region: Secondary | ICD-10-CM | POA: Diagnosis not present

## 2017-05-15 DIAGNOSIS — M50223 Other cervical disc displacement at C6-C7 level: Secondary | ICD-10-CM | POA: Insufficient documentation

## 2017-07-04 ENCOUNTER — Telehealth: Payer: Self-pay

## 2017-07-04 NOTE — Telephone Encounter (Signed)
Faxed [facility] CMN form 07/04/2017.

## 2017-07-14 ENCOUNTER — Encounter: Payer: Self-pay | Admitting: Internal Medicine

## 2017-07-16 NOTE — Telephone Encounter (Signed)
Called pt - informed needs an appt per Dr Crista ElliotHarbrecht. Appt scheduled 10/24 in [facility].

## 2017-07-17 ENCOUNTER — Other Ambulatory Visit: Payer: Self-pay | Admitting: *Deleted

## 2017-07-17 DIAGNOSIS — M5416 Radiculopathy, lumbar region: Secondary | ICD-10-CM

## 2017-07-17 MED ORDER — CYCLOBENZAPRINE HCL 5 MG PO TABS: 5.0000 mg | ORAL_TABLET | Freq: Every day | ORAL | 1 refills | Status: DC | PRN

## 2017-07-17 MED ORDER — GABAPENTIN 300 MG PO CAPS: ORAL_CAPSULE | ORAL | 1 refills | Status: DC

## 2017-07-17 NOTE — Telephone Encounter (Signed)
Dose gaba changed last appt but Rx was not changed. Sent in higher dose.

## 2017-07-18 ENCOUNTER — Telehealth: Payer: Self-pay | Admitting: Internal Medicine

## 2017-07-18 ENCOUNTER — Telehealth: Payer: Self-pay | Admitting: *Deleted

## 2017-07-18 ENCOUNTER — Ambulatory Visit (HOSPITAL_COMMUNITY)
Admission: RE | Admit: 2017-07-18 | Discharge: 2017-07-18 | Disposition: A | Discharge status: Home / Self Care | Payer: Medicaid Other | Source: Ambulatory Visit | Attending: Internal Medicine | Admitting: Internal Medicine

## 2017-07-18 ENCOUNTER — Ambulatory Visit (INDEPENDENT_AMBULATORY_CARE_PROVIDER_SITE_OTHER): Payer: Medicaid Other | Admitting: Internal Medicine

## 2017-07-18 VITALS — BP 126/73 | HR 92 | Temp 97.9°F | Ht 62.0 in | Wt 265.1 lb

## 2017-07-18 DIAGNOSIS — M25675 Stiffness of left foot, not elsewhere classified: Secondary | ICD-10-CM | POA: Diagnosis not present

## 2017-07-18 DIAGNOSIS — M7989 Other specified soft tissue disorders: Secondary | ICD-10-CM | POA: Insufficient documentation

## 2017-07-18 DIAGNOSIS — M79602 Pain in left arm: Secondary | ICD-10-CM

## 2017-07-18 DIAGNOSIS — R062 Wheezing: Secondary | ICD-10-CM

## 2017-07-18 DIAGNOSIS — R51 Headache: Secondary | ICD-10-CM

## 2017-07-18 DIAGNOSIS — I10 Essential (primary) hypertension: Secondary | ICD-10-CM

## 2017-07-18 DIAGNOSIS — G8921 Chronic pain due to trauma: Secondary | ICD-10-CM | POA: Diagnosis not present

## 2017-07-18 DIAGNOSIS — R6883 Chills (without fever): Secondary | ICD-10-CM

## 2017-07-18 DIAGNOSIS — M1712 Unilateral primary osteoarthritis, left knee: Secondary | ICD-10-CM

## 2017-07-18 DIAGNOSIS — M25512 Pain in left shoulder: Secondary | ICD-10-CM | POA: Diagnosis not present

## 2017-07-18 DIAGNOSIS — Z23 Encounter for immunization: Secondary | ICD-10-CM

## 2017-07-18 DIAGNOSIS — R110 Nausea: Secondary | ICD-10-CM

## 2017-07-18 DIAGNOSIS — M79672 Pain in left foot: Secondary | ICD-10-CM | POA: Diagnosis not present

## 2017-07-18 DIAGNOSIS — M25552 Pain in left hip: Secondary | ICD-10-CM | POA: Diagnosis not present

## 2017-07-18 DIAGNOSIS — M62830 Muscle spasm of back: Secondary | ICD-10-CM

## 2017-07-18 DIAGNOSIS — M7732 Calcaneal spur, left foot: Secondary | ICD-10-CM | POA: Diagnosis not present

## 2017-07-18 DIAGNOSIS — M545 Low back pain: Secondary | ICD-10-CM

## 2017-07-18 DIAGNOSIS — M25551 Pain in right hip: Secondary | ICD-10-CM | POA: Diagnosis not present

## 2017-07-18 DIAGNOSIS — M5412 Radiculopathy, cervical region: Secondary | ICD-10-CM

## 2017-07-18 NOTE — Assessment & Plan Note (Signed)
The patient has been having left arm pain for several months now. She states that the pain is on the medial aspect of her scapula. The patient has atypical presentation of her left shoulder pain with accompanied numbness and tingling. She has difficulty with abduction, external rotation, active internal rotation and external rotation with resistance. The patient sleeps on her arm nightly. Sensation intact bilateral upper extremity. Strength is 5/5 billateral upper extremity. She has some muscular tension noted in upper back.   The patient's presentation and location of the pain makes it unlikely that the patient has a rotator cuff injury. She most likely has a muscle strain.  -recommended continuing flexeril 5mg  prn and voltaren gel -recommended using heat and massaging the area -recommended avoiding sleeping on her arm -follow up as needed

## 2017-07-18 NOTE — Progress Notes (Signed)
Internal Medicine Clinic Attending  I saw and evaluated the patient.  I personally confirmed the key portions of the history and exam documented by Dr. Chundi and I reviewed pertinent patient test results.  The assessment, diagnosis, and plan were formulated together and I agree with the documentation in the resident's note. 

## 2017-07-18 NOTE — Progress Notes (Signed)
   CC: Left foot follow up  HPI:  [name] is a 46 y.o. f with pmh of chronic lower back pain, hypertension, and left knee arthritis who presents for follow up regarding his left foot. Please see assessment and plan for additional information.  Past Medical History:  Diagnosis Date  . Anemia   . Anxiety   . Arthritis    "left knee"  . Chronic lower back pain    "on the left side"  . Headache(784.0) 02/29/12   "frequent"  . Hypertension   . Migraines   . Pneumonia 2006  . Stress at home    "and at work"   Review of Systems:    Review of Systems  Constitutional: Positive for chills (nighttime for few months). Negative for fever.  Gastrointestinal: Positive for nausea. Negative for abdominal pain and vomiting.  Neurological: Positive for headaches.   Physical Exam:  Vitals:   07/18/17 0915  BP: 126/73  Pulse: 92  Temp: 97.9 F (36.6 C)  TempSrc: Oral  SpO2: 100%  Weight: 265 lb 1.6 oz (120.2 kg)  Height: 5\' 2"  (1.575 m)   Physical Exam  Constitutional: She appears well-developed and well-nourished. No distress.  HENT:  Head: Normocephalic and atraumatic.  Cardiovascular: Normal rate, regular rhythm, normal heart sounds and intact distal pulses.   Pulmonary/Chest: Effort normal. No respiratory distress. She has wheezes.  Abdominal: Soft. Bowel sounds are normal. She exhibits no distension. There is no tenderness.  Musculoskeletal:       Cervical back: She exhibits tenderness, pain and spasm. She exhibits no swelling, no edema, no deformity and no laceration.       Back:       Left foot: There is decreased range of motion, tenderness, bony tenderness and swelling. There is no crepitus, no deformity and no laceration.       Feet:  Sensation intact in bilateral lower extremities. The patient is able to plantar flex bilateral feet but has some difficulty with dorsiflexion of the left foot.   The patient has has difficulty with abduction, external rotation,  active internal rotation and external rotation with resistance. The patient sleeps on her arm nightly. Sensation intact bilateral upper extremity. Strength is 5/5 billateral upper extremity. She has some muscular tension noted in upper back.   Psychiatric: She has a normal mood and affect. Her behavior is normal. Judgment and thought content normal.    Assessment & Plan:   See Encounters Tab for problem based charting.  Patient seen with Dr. Heide SparkNarendra
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
## 2017-07-18 NOTE — Telephone Encounter (Signed)
Spoke to patient and informed her of xray results and told her to continue the conservative tx we spoke to her about.

## 2017-07-18 NOTE — Telephone Encounter (Signed)
Patient @ [facility] [address] at this time stating her muscle relaxant can not be dispensed due to MD not covered by medicare. Informed [facility] 07/17/17 by Dr. Rogelia BogaButcher. Advised to let pharmacist know so med can be transferred. Verbalized understanding.

## 2017-07-18 NOTE — Patient Instructions (Addendum)
It was a pleasure to see you today [name] [name]. Please make the following changes:  -Please use meloxicam 15mg  qd -Please get a foot xray done  -Please wrap your foot with ace bandage  -Use heat or icy hot on your back along with flexeril and voltaren gel  If you have any questions or concerns, please call our clinic at [phone] between 9am-5pm and after hours call [phone] and ask for the internal medicine resident on call. If you feel you are having a medical emergency please call 911.   Thank you, we look forward to help you remain healthy!  Lorenso Courier, MD Internal Medicine PGY1    Mid-Back Strain Rehab Ask your health care provider which exercises are safe for you. Do exercises exactly as told by your health care provider and adjust them as directed. It is normal to feel mild stretching, pulling, tightness, or discomfort as you do these exercises, but you should stop right away if you feel sudden pain or your pain gets worse. Do not begin these exercises until told by your health care provider. Stretching and range of motion exercises This exercise warms up your muscles and joints and improves the movement and flexibility of your back and shoulders. This exercise also help to relieve pain. Exercise A: Chest and spine stretch  1. Lie down on your back on a firm surface. 2. Roll a towel or a small blanket so it is about 4 inches (10 cm) in diameter. 3. Put the towel lengthwise under the middle of your back so it is under your spine, but not under your shoulder blades. 4. To increase the stretch, you may put your hands behind your head and let your elbows fall to your sides. 5. Hold for __________ seconds. Repeat exercise __________ times. Complete this exercise __________ times a day. Strengthening exercises These exercises build strength and endurance in your back and your shoulder blade muscles. Endurance is the ability to use your muscles for a long time, even after they  get tired. Exercise B: Alternating arm and leg raises  1. Get on your hands and knees on a firm surface. If you are on a hard floor, you may want to use padding to cushion your knees, such as an exercise mat. 2. Line up your arms and legs. Your hands should be below your shoulders, and your knees should be below your hips. 3. Lift your left leg behind you. At the same time, raise your right arm and straighten it in front of you. ? Do not lift your leg higher than your hip. ? Do not lift your arm higher than your shoulder. ? Keep your abdominal and back muscles tight. ? Keep your hips facing the ground. ? Do not arch your back. ? Keep your balance carefully, and do not hold your breath. 4. Hold for __________ seconds. 5. Slowly return to the starting position and repeat with your right leg and your left arm. Repeat __________ times. Complete this exercise __________ times a day. Exercise C: Straight arm rows ( shoulder extension) 1. Stand with your feet shoulder width apart. 2. Secure an exercise band to a stable object in front of you so the band is at or above shoulder height. 3. Hold one end of the exercise band in each hand. 4. Straighten your elbows and lift your hands up to shoulder height. 5. Step back, away from the secured end of the exercise band, until the band stretches. 6. Squeeze your shoulder blades together  and pull your hands down to the sides of your thighs. Stop when your hands are straight down by your sides. Do not let your hands go behind your body. 7. Hold for __________ seconds. 8. Slowly return to the starting position. Repeat __________ times. Complete this exercise __________ times a day. Exercise D: Shoulder external rotation, prone 1. Lie on your abdomen on a firm bed so your left / right forearm hangs over the edge of the bed and your upper arm is on the bed, straight out from your body. ? Your elbow should be bent. ? Your palm should be facing your  feet. 2. If instructed, hold a __________ weight in your hand. 3. Squeeze your shoulder blade toward the middle of your back. Do not let your shoulder lift toward your ear. 4. Keep your elbow bent in an "L" shape (90 degrees) while you slowly move your forearm up toward the ceiling. Move your forearm up to the height of the bed, toward your head. ? Your upper arm should not move. ? At the top of the movement, your palm should face the floor. 5. Hold for __________ seconds. 6. Slowly return to the starting position and relax your muscles. Repeat __________ times. Complete this exercise __________ times a day. Exercise E: Scapular retraction and external rotation, rowing  1. Sit in a stable chair without armrests, or stand. 2. Secure an exercise band to a stable object in front of you so it is at shoulder height. 3. Hold one end of the exercise band in each hand. 4. Bring your arms out straight in front of you. 5. Step back, away from the secured end of the exercise band, until the band stretches. 6. Pull the band backward. As you do this, bend your elbows and squeeze your shoulder blades together, but avoid letting the rest of your body move. Do not let your shoulders lift up toward your ears. 7. Stop when your elbows are at your sides or slightly behind your body. 8. Hold for __________ seconds. 9. Slowly straighten your arms to return to the starting position. Repeat __________ times. Complete this exercise __________ times a day. Posture and body mechanics  Body mechanics refers to the movements and positions of your body while you do your daily activities. Posture is part of body mechanics. Good posture and healthy body mechanics can help to relieve stress in your body's tissues and joints. Good posture means that your spine is in its natural S-curve position (your spine is neutral), your shoulders are pulled back slightly, and your head is not tipped forward. The following are general  guidelines for applying improved posture and body mechanics to your everyday activities. Standing   When standing, keep your spine neutral and your feet about hip-width apart. Keep a slight bend in your knees. Your ears, shoulders, and hips should line up.  When you do a task in which you lean forward while standing in one place for a long time, place one foot up on a stable object that is 2-4 inches (5-10 cm) high, such as a footstool. This helps keep your spine neutral. Sitting   When sitting, keep your spine neutral and keep your feet flat on the floor. Use a footrest, if necessary, and keep your thighs parallel to the floor. Avoid rounding your shoulders, and avoid tilting your head forward.  When working at a desk or a computer, keep your desk at a height where your hands are slightly lower than your elbows.  Slide your chair under your desk so you are close enough to maintain good posture.  When working at a computer, place your monitor at a height where you are looking straight ahead and you do not have to tilt your head forward or downward to look at the screen. Resting  When lying down and resting, avoid positions that are most painful for you.  If you have pain with activities such as sitting, bending, stooping, or squatting (flexion-based activities), lie in a position in which your body does not bend very much. For example, avoid curling up on your side with your arms and knees near your chest (fetal position).  If you have pain with activities such as standing for a long time or reaching with your arms (extension-based activities), lie with your spine in a neutral position and bend your knees slightly. Try the following positions:  Lying on your side with a pillow between your knees.  Lying on your back with a pillow under your knees.  Lifting   When lifting objects, keep your feet at least shoulder-width apart and tighten your abdominal muscles.  Bend your knees and hips  and keep your spine neutral. It is important to lift using the strength of your legs, not your back. Do not lock your knees straight out.  Always ask for help to lift heavy or awkward objects. This information is not intended to replace advice given to you by your health care provider. Make sure you discuss any questions you have with your health care provider. Document Released: 09/11/2005 Document Revised: 05/18/2016 Document Reviewed: 06/23/2015 Elsevier Interactive Patient Education  [employer]2018 [employer]

## 2017-07-23 ENCOUNTER — Other Ambulatory Visit: Payer: Self-pay | Admitting: Student in an Organized Health Care Education/Training Program

## 2017-08-08 NOTE — Telephone Encounter (Signed)
The patient should come into [facility] again or follow up with pcp to note why she continues to need the mobic. Thank you!  Ellis Koffler

## 2017-08-10 NOTE — Telephone Encounter (Signed)
Pt made aware and will keep already scheduled appt for 11/26 @0915 .Kingsley SpittleGoldston, Hend Mccarrell Cassady11/16/20182:48 PM

## 2017-08-20 ENCOUNTER — Ambulatory Visit: Payer: Medicaid Other

## 2017-08-21 ENCOUNTER — Encounter: Payer: Self-pay | Admitting: Internal Medicine

## 2017-09-30 ENCOUNTER — Other Ambulatory Visit: Payer: Self-pay | Admitting: Internal Medicine

## 2017-10-01 MED ORDER — MELOXICAM 15 MG PO TABS: 15.0000 mg | ORAL_TABLET | Freq: Every day | ORAL | 2 refills | Status: DC

## 2017-10-17 ENCOUNTER — Ambulatory Visit: Payer: Medicaid Other

## 2017-10-18 ENCOUNTER — Telehealth: Payer: Self-pay | Admitting: *Deleted

## 2017-10-18 NOTE — Telephone Encounter (Signed)
[facility] calls and states they need dr burns supervisor's dea, for cyclobenzaprine, ask when the script was written, answer 12/2016, ask the tech, how can that script be good at this length of time, answered "oh, it's really not, informed tech if pt wants a refill she would need to be seen in office. Last visit 07/18/17 w/ f/u in 1 week. Do you agree pt needs to be seen? If not please send script to [facility] [address]

## 2017-10-22 NOTE — Telephone Encounter (Signed)
I would like for her to be seen and reassessed as to whether or not she needs to be on cyclobenzaprine as a long term medication. Please have her seen in [facility] at her next available time to evaluate her condition. I do not think I have met her to date.   Thank you,  Kathi Ludwig, MD

## 2017-11-01 NOTE — Telephone Encounter (Signed)
Per dr Crista Elliotharbrecht, pt needs [facility] appt and an appt w/ him 5/1, thanks

## 2017-11-26 ENCOUNTER — Ambulatory Visit: Payer: Medicaid Other

## 2017-12-10 ENCOUNTER — Other Ambulatory Visit: Payer: Self-pay

## 2017-12-10 ENCOUNTER — Ambulatory Visit (INDEPENDENT_AMBULATORY_CARE_PROVIDER_SITE_OTHER): Payer: Medicaid Other | Admitting: Internal Medicine

## 2017-12-10 DIAGNOSIS — Z975 Presence of (intrauterine) contraceptive device: Secondary | ICD-10-CM

## 2017-12-10 DIAGNOSIS — Z0000 Encounter for general adult medical examination without abnormal findings: Secondary | ICD-10-CM

## 2017-12-10 DIAGNOSIS — R030 Elevated blood-pressure reading, without diagnosis of hypertension: Secondary | ICD-10-CM

## 2017-12-10 DIAGNOSIS — R5381 Other malaise: Secondary | ICD-10-CM

## 2017-12-10 DIAGNOSIS — Z791 Long term (current) use of non-steroidal anti-inflammatories (NSAID): Secondary | ICD-10-CM | POA: Diagnosis not present

## 2017-12-10 DIAGNOSIS — Z79899 Other long term (current) drug therapy: Secondary | ICD-10-CM

## 2017-12-10 DIAGNOSIS — M25562 Pain in left knee: Secondary | ICD-10-CM

## 2017-12-10 DIAGNOSIS — M62830 Muscle spasm of back: Secondary | ICD-10-CM

## 2017-12-10 DIAGNOSIS — M549 Dorsalgia, unspecified: Secondary | ICD-10-CM

## 2017-12-10 DIAGNOSIS — M170 Bilateral primary osteoarthritis of knee: Secondary | ICD-10-CM

## 2017-12-10 DIAGNOSIS — R5383 Other fatigue: Secondary | ICD-10-CM

## 2017-12-10 DIAGNOSIS — M5416 Radiculopathy, lumbar region: Secondary | ICD-10-CM

## 2017-12-10 MED ORDER — CYCLOBENZAPRINE HCL 5 MG PO TABS: 5.0000 mg | ORAL_TABLET | Freq: Every day | ORAL | 2 refills | Status: DC | PRN

## 2017-12-10 MED ORDER — MELOXICAM 15 MG PO TABS: 15.0000 mg | ORAL_TABLET | Freq: Every day | ORAL | 2 refills | Status: DC

## 2017-12-10 MED ORDER — CYCLOBENZAPRINE HCL 5 MG PO TABS
5.0000 mg | ORAL_TABLET | Freq: Every day | ORAL | 2 refills | Status: DC | PRN
Start: 2017-12-10 — End: 2018-02-28

## 2017-12-10 NOTE — Assessment & Plan Note (Signed)
Her BP is elevated at 146/97 today.  Previously she has been normotensive.  Her Body mass index is 48.65 kg/m.    Plan: - Follow up with PCP at next available.  Continue to monitor blood pressure - Weight loss encouraged as her morbid obesity is likely contributing to her high blood pressure reading.

## 2017-12-10 NOTE — Assessment & Plan Note (Signed)
She was here as well for Pap smear.  She had normal Pap smear with negative HPV co-testing 3 years ago.  She also has a Nexplanon that is due for removal.  [facility] and was told they no longer provide this service.   Plan: - I discussed with her that the current recommendations were for her to have repeat Pap with co-testing at 5 year intervals since she is not having any symptoms. - I am not sure what happened or whom she called regarding her Nexplanon.  Nonetheless, [facility] of removing and/or replacing Nexplanon implants.  I will place a new referral to Ob/Gyn for her.

## 2017-12-10 NOTE — Progress Notes (Signed)
CC: Knee pain, left  HPI:  [name] [name] is a [age] y.o. woman with a past medical history listed below here today for left knee pain and med refills.  For details of today's visit and the status of her chronic medical issues please refer to the assessment and plan.   Past Medical History:  Diagnosis Date  . Anemia   . Anxiety   . Arthritis    "left knee"  . Chronic lower back pain    "on the left side"  . Headache(784.0) 02/29/12   "frequent"  . Hypertension   . Migraines   . Pneumonia 2006  . Stress at home    "and at work"   Review of Systems:   Review of Systems  Constitutional: Positive for malaise/fatigue. Negative for chills and fever.  Gastrointestinal: Negative for abdominal pain.  Musculoskeletal: Positive for back pain and joint pain.  Neurological: Negative for focal weakness.     Physical Exam:  Vitals:   12/10/17 0956  BP: (!) 146/97  Pulse: 99  Temp: 98 F (36.7 C)  SpO2: 100%  Weight: 266 lb (120.7 kg)  Height: 5\' 2"  (1.575 m)   Physical Exam  Constitutional: She is oriented to person, place, and time and well-developed, well-nourished, and in no distress.  Musculoskeletal: Normal range of motion. She exhibits tenderness.  Medial joint line tender on the left knee.   Neurological: She is alert and oriented to person, place, and time. Gait normal.  Skin: Skin is warm and dry.  Psychiatric: Mood and affect normal.    Assessment & Plan:   See Encounters Tab for problem based charting.  Patient discussed with Dr. Heide SparkNarendra.  Elevated blood pressure reading Her BP is elevated at 146/97 today.  Previously she has been normotensive.  Her Body mass index is 48.65 kg/m.    Plan: - Follow up with PCP at next available.  Continue to monitor blood pressure - Weight loss encouraged as her morbid obesity is likely contributing to her high blood pressure reading.   Left knee pain Patient complains today of left knee pain that started after  beginning new job at [employer].  She is on her feet for 5-6 hours at a time on concrete flooring.  It is described as aching and throbbing.  Located medially.  No specific injury or trauma.  Has been told she has arthritis in the knee before.  Pain lasts for about 5-10 minutes at a time, 3 to 4 times per day.  Pain relieved with Gabapentin and Flexeril, propping up her leg, and self massage.  She has tried Voltaren gel in the past without relief.  Icing her knee has been irritating.  She would like a knee support sleeve to wear while at work.  Plan: - Discussed her weight is likely contributing a lot to her knee pain and underlying OA - Will try Mobic 15mg  daily - Ice as tolerated - DME knee support device while working - Weight loss encouraged - Refill of her Flexeril given - Follow up with PCP.  Consideration of PT, steroid injection in the future  Health care maintenance She was here as well for Pap smear.  She had normal Pap smear with negative HPV co-testing 3 years ago.  She also has a Nexplanon that is due for removal.  She reports [facility] and was told they no longer provide this service.   Plan: - I discussed with her that the current recommendations were for her  to have repeat Pap with co-testing at 5 year intervals since she is not having any symptoms. - I am not sure what happened or whom she called regarding her Nexplanon.  Nonetheless, [facility] am [facility] this service of removing and/or replacing Nexplanon implants.  I will place a new referral to Ob/Gyn for her.

## 2017-12-10 NOTE — Assessment & Plan Note (Signed)
Patient complains today of left knee pain that started after beginning new job at [employer].  She is on her feet for 5-6 hours at a time on concrete flooring.  It is described as aching and throbbing.  Located medially.  No specific injury or trauma.  Has been told she has arthritis in the knee before.  Pain lasts for about 5-10 minutes at a time, 3 to 4 times per day.  Pain relieved with Gabapentin and Flexeril, propping up her leg, and self massage.  She has tried Voltaren gel in the past without relief.  Icing her knee has been irritating.  She would like a knee support sleeve to wear while at work.  Plan: - Discussed her weight is likely contributing a lot to her knee pain and underlying OA - Will try Mobic 15mg  daily - Ice as tolerated - DME knee support device while working - Weight loss encouraged - Refill of her Flexeril given - Follow up with PCP.  Consideration of PT, steroid injection in the future

## 2017-12-10 NOTE — Progress Notes (Signed)
Internal Medicine Clinic Attending  Case discussed with Dr. Wallace at the time of the visit.  We reviewed the resident's history and exam and pertinent patient test results.  I agree with the assessment, diagnosis, and plan of care documented in the resident's note.  

## 2017-12-10 NOTE — Patient Instructions (Signed)
FOLLOW-UP INSTRUCTIONS When: next available with Dr Crista ElliotHarbrecht  For: General follow up What to bring: medications  I have refilled your Flexeril that you take for muscle spasms and back pain  I have prescribed Mobic to take daily for your knee pain  I have given you a prescription for a knee sleeve to add more support.

## 2018-01-02 ENCOUNTER — Other Ambulatory Visit: Payer: Self-pay

## 2018-01-02 ENCOUNTER — Emergency Department (HOSPITAL_COMMUNITY)
Admission: EM | Admit: 2018-01-02 | Discharge: 2018-01-02 | Disposition: A | Discharge status: Home / Self Care | Payer: Medicaid Other | Attending: Emergency Medicine | Admitting: Emergency Medicine

## 2018-01-02 ENCOUNTER — Encounter (HOSPITAL_COMMUNITY): Payer: Self-pay | Admitting: Emergency Medicine

## 2018-01-02 DIAGNOSIS — R1013 Epigastric pain: Secondary | ICD-10-CM

## 2018-01-02 DIAGNOSIS — I10 Essential (primary) hypertension: Secondary | ICD-10-CM | POA: Diagnosis not present

## 2018-01-02 DIAGNOSIS — Z79899 Other long term (current) drug therapy: Secondary | ICD-10-CM | POA: Diagnosis not present

## 2018-01-02 LAB — COMPREHENSIVE METABOLIC PANEL
ALK PHOS: 69 U/L (ref 38–126)
ALT: 9 U/L — AB (ref 14–54)
AST: 16 U/L (ref 15–41)
Albumin: 3.5 g/dL (ref 3.5–5.0)
Anion gap: 11 (ref 5–15)
BILIRUBIN TOTAL: 0.4 mg/dL (ref 0.3–1.2)
BUN: 6 mg/dL (ref 6–20)
CALCIUM: 9 mg/dL (ref 8.9–10.3)
CO2: 21 mmol/L — ABNORMAL LOW (ref 22–32)
CREATININE: 0.93 mg/dL (ref 0.44–1.00)
Chloride: 109 mmol/L (ref 101–111)
Glucose, Bld: 107 mg/dL — ABNORMAL HIGH (ref 65–99)
Potassium: 4 mmol/L (ref 3.5–5.1)
Sodium: 141 mmol/L (ref 135–145)
Total Protein: 7.5 g/dL (ref 6.5–8.1)

## 2018-01-02 LAB — URINALYSIS, ROUTINE W REFLEX MICROSCOPIC
Bilirubin Urine: NEGATIVE
GLUCOSE, UA: NEGATIVE mg/dL
KETONES UR: NEGATIVE mg/dL
Nitrite: NEGATIVE
PH: 5 (ref 5.0–8.0)
Protein, ur: NEGATIVE mg/dL
SPECIFIC GRAVITY, URINE: 1.02 (ref 1.005–1.030)

## 2018-01-02 LAB — CBC
HCT: 40.3 % (ref 36.0–46.0)
Hemoglobin: 12.5 g/dL (ref 12.0–15.0)
MCH: 25.3 pg — ABNORMAL LOW (ref 26.0–34.0)
MCHC: 31 g/dL (ref 30.0–36.0)
MCV: 81.6 fL (ref 78.0–100.0)
PLATELETS: 452 10*3/uL — AB (ref 150–400)
RBC: 4.94 MIL/uL (ref 3.87–5.11)
RDW: 15.7 % — AB (ref 11.5–15.5)
WBC: 6.8 10*3/uL (ref 4.0–10.5)

## 2018-01-02 LAB — I-STAT BETA HCG BLOOD, ED (MC, WL, AP ONLY): I-stat hCG, quantitative: <5 m[IU]/mL (ref <5)

## 2018-01-02 LAB — LIPASE, BLOOD: Lipase: 28 U/L (ref 11–51)

## 2018-01-02 MED ORDER — PANTOPRAZOLE SODIUM 20 MG PO TBEC: 20.0000 mg | DELAYED_RELEASE_TABLET | Freq: Every day | ORAL | 1 refills | Status: DC

## 2018-01-02 MED ORDER — ONDANSETRON 4 MG PO TBDP: 4.0000 mg | ORAL_TABLET | Freq: Three times a day (TID) | ORAL | 0 refills | Status: DC | PRN

## 2018-01-02 MED ORDER — GI COCKTAIL ~~LOC~~
30.0000 mL | Freq: Once | ORAL | Status: AC
  Administered 2018-01-02: 30 mL via ORAL
  Filled 2018-01-02: qty 30

## 2018-01-02 MED ORDER — FAMOTIDINE 20 MG PO TABS
20.0000 mg | ORAL_TABLET | Freq: Once | ORAL | Status: AC
  Administered 2018-01-02: 20 mg via ORAL
  Filled 2018-01-02: qty 1

## 2018-01-02 MED ORDER — PANTOPRAZOLE SODIUM 40 MG PO TBEC
40.0000 mg | DELAYED_RELEASE_TABLET | Freq: Once | ORAL | Status: AC
  Administered 2018-01-02: 40 mg via ORAL
  Filled 2018-01-02: qty 1

## 2018-01-02 MED ORDER — FAMOTIDINE 20 MG PO TABS: 20.0000 mg | ORAL_TABLET | Freq: Two times a day (BID) | ORAL | 0 refills | Status: DC

## 2018-01-02 MED ORDER — ONDANSETRON 4 MG PO TBDP
4.0000 mg | ORAL_TABLET | Freq: Once | ORAL | Status: AC
  Administered 2018-01-02: 4 mg via ORAL
  Filled 2018-01-02: qty 1

## 2018-01-02 NOTE — Discharge Instructions (Addendum)
°  Diet: Start with a clear liquid diet, progressed to a full liquid diet, and then bland solids as you are able. Please adhere to the enclosed dietary suggestions.  In general, avoid NSAIDs (i.e. ibuprofen, naproxen, etc.), caffeine, alcohol, spicy foods, fatty foods, or any other foods that seem to cause your symptoms to arise.  Protonix: Take this medication daily, 20-30 minutes prior to your first meal, for the next 8 weeks.  Continue to take this medication even if you begin to feel better.  Pepcid: Take this medication twice a day for the next 5 days.  Zofran: May take Zofran, as needed, for nausea and/or vomiting.  Follow-up: Please follow-up with your primary care provider and gastroenterology on this matter.  Return: Return to the ED for significantly worsening symptoms, persistent vomiting, persistent fever, vomiting blood, blood in the stools, dark stools, or any other major concerns.

## 2018-01-02 NOTE — ED Provider Notes (Signed)
[facility] EMERGENCY DEPARTMENT Provider Note   CSN: [id number] Arrival date & time: 01/02/18  0827     History   Chief Complaint Chief Complaint  Patient presents with  . Abdominal Pain    HPI [name] [name] is a 47 y.o. female.  HPI   BARBETTE MCGLAUN is a 47 y.o. female, with a history of HTN, presenting to the ED with epigastric pain beginning three days ago.  Pain is intermittent, sharp/aching, 7/10, nonradiating, seems to be associated with eating or lying back, lasts for 20-30 minutes.  States it feels similar to gallbladder pain she experienced before it was removed. Associated with nausea and nonbloody, nonbilious vomiting after pain arises. Also complains of loose, "greasy" stools, but without change in stool color. Has tried peptobismol without improvement.  Takes meloxicam almost daily for at least 2 years.  Frequently eats fatty or greasy foods and uses caffeine throughout the day.  Denies fever/chills, hematochezia/melena, CP, SOB, or any other complaints.    Past Medical History:  Diagnosis Date  . Anemia   . Anxiety   . Arthritis    "left knee"  . Chronic lower back pain    "on the left side"  . Headache(784.0) 02/29/12   "frequent"  . Hypertension   . Migraines   . Pneumonia 2006  . Stress at home    "and at work"    Patient Active Problem List   Diagnosis Date Noted  . Elevated blood pressure reading 12/10/2017  . Left foot pain 07/18/2017  . Cervical radiculopathy 04/30/2017  . Left arm pain 11/28/2016  . Pre-diabetes 02/16/2016  . Left knee pain 11/04/2015  . OSA (obstructive sleep apnea) 05/14/2015  . Chronic lumbar radiculopathy 11/03/2014  . Other allergic rhinitis 09/07/2014  . Health care maintenance 09/07/2014  . OBESITY, MORBID 09/20/2006  . Anemia, iron deficiency 08/09/2006    Past Surgical History:  Procedure Laterality Date  . BREAST BIOPSY Left 07/29/2013  . CESAREAN SECTION  08/2008  .  CHOLECYSTECTOMY  03/02/2012   Procedure: LAPAROSCOPIC CHOLECYSTECTOMY;  Surgeon: Cherylynn Ridges, MD;  Location: [facility] OR;  Service: General;;  . WISDOM TOOTH EXTRACTION       OB History   None      Home Medications    Prior to Admission medications   Medication Sig Start Date End Date Taking? Authorizing Provider  cyclobenzaprine (FLEXERIL) 5 MG tablet Take 1 tablet (5 mg total) by mouth daily as needed for muscle spasms. 90 tablets is a 90 day supply 12/10/17   Gwynn Burly, DO  DULoxetine (CYMBALTA) 60 MG capsule TAKE ONE CAPSULE  BY MOUTH AS DIRECTED 11/30/16   Burns, Tinnie Gens, MD  famotidine (PEPCID) 20 MG tablet Take 1 tablet (20 mg total) by mouth 2 (two) times daily for 5 days. 01/02/18 01/07/18  Katlynne Mckercher C, PA-C  fluticasone (FLONASE) 50 MCG/ACT nasal spray Place 2 sprays into both nostrils daily. 06/27/16   Burns, Tinnie Gens, MD  gabapentin (NEURONTIN) 300 MG capsule Take 300 mg in the morning and 900 mg (3 tablets) at night. 07/17/17   Burns Spain, MD  meloxicam (MOBIC) 15 MG tablet Take 1 tablet (15 mg total) by mouth daily. 12/10/17   Gwynn Burly, DO  ondansetron (ZOFRAN ODT) 4 MG disintegrating tablet Take 1 tablet (4 mg total) by mouth every 8 (eight) hours as needed for nausea or vomiting. 01/02/18   Jovonte Commins C, PA-C  pantoprazole (PROTONIX) 20 MG tablet Take 1  tablet (20 mg total) by mouth daily. 01/02/18 03/03/18  Anselm Pancoast, PA-C    Family History Family History  Problem Relation Age of Onset  . Cancer Other     Social History Social History   Tobacco Use  . Smoking status: Never Smoker  . Smokeless tobacco: Never Used  Substance Use Topics  . Alcohol use: No    Alcohol/week: 0.0 oz  . Drug use: No     Allergies   Mushroom extract complex   Review of Systems Review of Systems  Constitutional: Negative for chills, diaphoresis and fever.  Respiratory: Negative for shortness of breath.   Cardiovascular: Negative for chest pain.  Gastrointestinal:  Positive for abdominal pain, diarrhea, nausea and vomiting. Negative for blood in stool.  Genitourinary: Negative for dysuria, frequency, hematuria, vaginal bleeding and vaginal discharge.  All other systems reviewed and are negative.    Physical Exam Updated Vital Signs BP (!) 139/92 (BP Location: Right Arm)   Pulse 79   Temp 98.2 F (36.8 C) (Oral)   Resp 16   SpO2 100%   Physical Exam  Constitutional: She appears well-developed and well-nourished. No distress.  HENT:  Head: Normocephalic and atraumatic.  Eyes: Conjunctivae are normal.  Neck: Neck supple.  Cardiovascular: Normal rate, regular rhythm, normal heart sounds and intact distal pulses.  Pulmonary/Chest: Effort normal and breath sounds normal. No respiratory distress.  Abdominal: Soft. Bowel sounds are normal. There is tenderness in the epigastric area. There is no guarding.  Musculoskeletal: She exhibits no edema.  Lymphadenopathy:    She has no cervical adenopathy.  Neurological: She is alert.  Skin: Skin is warm and dry. She is not diaphoretic.  Psychiatric: She has a normal mood and affect. Her behavior is normal.  Nursing note and vitals reviewed.    ED Treatments / Results  Labs (all labs ordered are listed, but only abnormal results are displayed) Labs Reviewed  COMPREHENSIVE METABOLIC PANEL - Abnormal; Notable for the following components:      Result Value   CO2 21 (*)    Glucose, Bld 107 (*)    ALT 9 (*)    All other components within normal limits  CBC - Abnormal; Notable for the following components:   MCH 25.3 (*)    RDW 15.7 (*)    Platelets 452 (*)    All other components within normal limits  URINALYSIS, ROUTINE W REFLEX MICROSCOPIC - Abnormal; Notable for the following components:   APPearance HAZY (*)    Hgb urine dipstick MODERATE (*)    Leukocytes, UA LARGE (*)    Bacteria, UA FEW (*)    Squamous Epithelial / LPF 6-30 (*)    All other components within normal limits  LIPASE, BLOOD   I-STAT BETA HCG BLOOD, ED ([facility], [facility], [facility] ONLY)    EKG EKG Interpretation  Date/Time:  Wednesday January 02 2018 09:02:34 EDT Ventricular Rate:  95 PR Interval:  150 QRS Duration: 76 QT Interval:  348 QTC Calculation: 437 R Axis:   78 Text Interpretation:  Normal sinus rhythm Normal ECG Since last tracing QRS now normal Confirmed by Mancel Bale [phone]) on 01/02/2018 1:20:11 PM   Radiology No results found.  Procedures Procedures (including critical care time)  Medications Ordered in ED Medications  gi cocktail (Maalox,Lidocaine,Donnatal) (30 mLs Oral Given 01/02/18 1256)  pantoprazole (PROTONIX) EC tablet 40 mg (40 mg Oral Given 01/02/18 1236)  famotidine (PEPCID) tablet 20 mg (20 mg Oral Given 01/02/18 1236)  ondansetron (ZOFRAN-ODT)  disintegrating tablet 4 mg (4 mg Oral Given 01/02/18 1233)     Initial Impression / Assessment and Plan / ED Course  I have reviewed the triage vital signs and the nursing notes.  Pertinent labs & imaging results that were available during my care of the patient were reviewed by me and considered in my medical decision making (see chart for details).  Clinical Course as of Jan 02 1417  Wed Jan 02, 2018  1306 Slightly decreased value suggests mild dehydration.  Patient given an option for oral versus IV fluids.  Opted for oral hydration.  CO2(!): 21 [SJ]  1418 Confirms no urinary symptoms.   Leukocytes, UA(!): LARGE [SJ]  1418 Patient states she has occasional spotting, but this is normal for her.  Hgb urine dipstickMarland Kitchen(!): MODERATE [SJ]    Clinical Course User Index [SJ] Andretta Ergle C, PA-C    Patient presents with abdominal pain with nausea and vomiting. Patient is nontoxic appearing, afebrile, not tachycardic on my exam, not tachypneic, not hypotensive, maintains excellent SPO2 on room air, and is in no apparent distress.  No leukocytosis.  No LFT or lipase elevation.  Patient presentation and complaint history suggest peptic disease versus  gastritis as possible diagnoses.  Gastroenterology follow-up.  The patient was given instructions for home care as well as return precautions. Patient voices understanding of these instructions, accepts the plan, and is comfortable with discharge.  Vitals:   01/02/18 0857 01/02/18 0859 01/02/18 1131  BP: (!) 140/107 (!) 102/59 (!) 139/92  Pulse: (!) 106 79 79  Resp: 20 16 16   Temp: 98.3 F (36.8 C) 98.2 F (36.8 C)   TempSrc: Oral Oral   SpO2: 99% 97% 100%     Final Clinical Impressions(s) / ED Diagnoses   Final diagnoses:  Epigastric pain    ED Discharge Orders        Ordered    pantoprazole (PROTONIX) 20 MG tablet  Daily     01/02/18 1314    famotidine (PEPCID) 20 MG tablet  2 times daily     01/02/18 1314    ondansetron (ZOFRAN ODT) 4 MG disintegrating tablet  Every 8 hours PRN     01/02/18 1314       Brighid Koch, Hillard DankerShawn C, PA-C 01/02/18 1419    Mancel BaleWentz, Elliott, MD 01/03/18 1511

## 2018-01-02 NOTE — ED Triage Notes (Signed)
Pt to ER for evaluation of epigastric pain with nausea/vomiting, states pain began prior to n/v. Also reports "hot flashes." states this has been going on for 3 days. Pt in NAD.

## 2018-01-09 ENCOUNTER — Other Ambulatory Visit: Payer: Self-pay | Admitting: *Deleted

## 2018-01-09 MED ORDER — DULOXETINE HCL 60 MG PO CPEP: ORAL_CAPSULE | ORAL | 0 refills | Status: DC

## 2018-01-09 NOTE — Telephone Encounter (Signed)
Thank you. Will refill until her appointment

## 2018-01-30 ENCOUNTER — Ambulatory Visit: Payer: Medicaid Other | Admitting: Internal Medicine

## 2018-01-30 ENCOUNTER — Encounter: Payer: Self-pay | Admitting: Internal Medicine

## 2018-01-30 ENCOUNTER — Other Ambulatory Visit: Payer: Self-pay

## 2018-01-30 VITALS — BP 143/89 | HR 93 | Temp 98.6°F | Wt 265.5 lb

## 2018-01-30 DIAGNOSIS — G8929 Other chronic pain: Secondary | ICD-10-CM | POA: Diagnosis not present

## 2018-01-30 DIAGNOSIS — R51 Headache: Secondary | ICD-10-CM

## 2018-01-30 DIAGNOSIS — G44219 Episodic tension-type headache, not intractable: Secondary | ICD-10-CM

## 2018-01-30 DIAGNOSIS — R222 Localized swelling, mass and lump, trunk: Secondary | ICD-10-CM | POA: Diagnosis not present

## 2018-01-30 DIAGNOSIS — Z9989 Dependence on other enabling machines and devices: Secondary | ICD-10-CM | POA: Diagnosis not present

## 2018-01-30 DIAGNOSIS — M5416 Radiculopathy, lumbar region: Secondary | ICD-10-CM | POA: Diagnosis not present

## 2018-01-30 DIAGNOSIS — G4733 Obstructive sleep apnea (adult) (pediatric): Secondary | ICD-10-CM

## 2018-01-30 DIAGNOSIS — Z791 Long term (current) use of non-steroidal anti-inflammatories (NSAID): Secondary | ICD-10-CM

## 2018-01-30 DIAGNOSIS — Z79899 Other long term (current) drug therapy: Secondary | ICD-10-CM | POA: Diagnosis not present

## 2018-01-30 DIAGNOSIS — Z8679 Personal history of other diseases of the circulatory system: Secondary | ICD-10-CM | POA: Insufficient documentation

## 2018-01-30 DIAGNOSIS — M5440 Lumbago with sciatica, unspecified side: Secondary | ICD-10-CM | POA: Diagnosis not present

## 2018-01-30 DIAGNOSIS — I10 Essential (primary) hypertension: Secondary | ICD-10-CM | POA: Diagnosis not present

## 2018-01-30 MED ORDER — LISINOPRIL 5 MG PO TABS: 5.0000 mg | ORAL_TABLET | Freq: Every day | ORAL | 11 refills | Status: DC

## 2018-01-30 NOTE — Assessment & Plan Note (Signed)
  HTN: Patient was noted to be hypertensive again today to 143/89.  This appears consistent with several of her previous readings as well wearing her systolic is greater than 140 and diastolic approximately equal to or greater than 90.  Given the notable systolic and diastolic hypertension marked over several week reading spreadout greater than 6 months I suggest we start an antihypertensive Plan: Patient is agreed to lisinopril 5 mg daily BMP in 3 weeks

## 2018-01-30 NOTE — Progress Notes (Deleted)
   CC: ***  HPI:  [name] is a 47 y.o.   Past Medical History:  Diagnosis Date  . Anemia   . Anxiety   . Arthritis    "left knee"  . Chronic lower back pain    "on the left side"  . Headache(784.0) 02/29/12   "frequent"  . Hypertension   . Migraines   . Pneumonia 2006  . Stress at home    "and at work"   Review of Systems:  ***  Physical Exam:  There were no vitals filed for this visit. ***  Assessment & Plan:   See Encounters Tab for problem based charting.  Patient {GC/GE:[id number]::"discussed with","seen with"} Dr. {NAMES:[id number]::"Butcher","Granfortuna","E. Hoffman","Klima","Mullen","Narendra","Raines","Vincent"}

## 2018-01-30 NOTE — Assessment & Plan Note (Addendum)
Sciatica: Pain appears well-controlled but most likely exacerbated by weight and repetitive motion in the patient's current job.  We will continue to discuss options to reduce his chronic pain in the hopes of preventing it from worsening significantly over time Plan: Continue duloxetine and gabapentin Continue meloxicam Continue Flexeril as needed muscle spasm

## 2018-01-30 NOTE — Assessment & Plan Note (Signed)
Painful mass in the upper back: There is a small 3 cm elongated tender mass ~1.5 cm wide that can be palpated in the right mid back directly inferior and medially located to the right scapula.  This appears mobile with movement of the scapula and moderately tender consistent with inflammation of local tendon or muscular injury.  There were no clear concerning features of visible malformations present with physical exam or with regard to the patient's history concerning the mass at this time. Plan: We will monitor this for improvement or worsening advised the patient to return if her symptoms change notably.

## 2018-01-30 NOTE — Patient Instructions (Addendum)
FOLLOW-UP INSTRUCTIONS When: Please return on May 30-31st  For: Blood pressure check and labs What to bring: All of your medications  I have prescribed lisinopril 5 mg daily to be taken for the noted high blood pressure. We would like free to return to May 30 31st with blood pressure check a basic metabolic profile lab, and hemoglobin A1c.  With regard to the tender spot on your back, I suggest continuing to avoid any inciting factors and using heat if this benefits.  We will otherwise continue to monitor this for progression and if you develop any concerning symptoms please let us know.  Thank you for your visit to [facility] internal medicine clinic today.

## 2018-01-30 NOTE — Assessment & Plan Note (Signed)
OSA: The patient is up to sleep apnea mildly well-controlled with compliance of her CPAP noticed per patient generally most nights.  We discussed the importance of continuing CPAP and the reduction of high blood pressure, weight gain, heart attack risk, stroke risk as well as lung diseases.  In addition advised the patient to seek assistance of her provider for the CPAP machine if it anytime this proves uncomfortable or intolerable.

## 2018-01-30 NOTE — Progress Notes (Signed)
CC: Back pain  HPI:[name] [name] is a 47 y.o. female who presents today for evaluation of her chronic lower back pain and painful mass of the mid upper back.  Painful mass in the upper back: There is a small 3 cm elongated tender mass ~1.5 cm wide that can be palpated in the right mid back directly inferior and medially located to the right scapula.  This appears mobile with movement of the scapula and moderately tender consistent with inflammation of local tendon or muscular injury.  There were no clear concerning features of visible malformations present with physical exam or with regard to the patient's history concerning the mass at this time. Plan: We will monitor this for improvement or worsening advised the patient to return if her symptoms change notably.  HTN: Patient was noted to be hypertensive again today to 143/89.  This appears consistent with several of her previous readings as well wearing her systolic is greater than 140 and diastolic approximately equal to or greater than 90.  Given the notable systolic and diastolic hypertension marked over several week reading spreadout greater than 6 months I suggest we start an antihypertensive Plan: Patient is agreed to lisinopril 5 mg daily BMP in 3 weeks  Sciatica: Pain appears well-controlled, patent duloxetine. Plan: Continue duloxetine and gabapentin  OSA: The patient is up to sleep apnea mildly well-controlled with compliance of her CPAP noticed per patient generally most nights.  We discussed the importance of continuing CPAP and the reduction of high blood pressure, weight gain, heart attack risk, stroke risk as well as lung diseases.  In addition advised the patient to seek assistance of her provider for the CPAP machine if it anytime this proves uncomfortable or intolerable.  Headaches: The patient continues to experience occasional intermittent headaches with no current known trigger.  We discussed keeping a log of  the occurrence of the headaches, preceeding factors, and factors noted to relieve the headache so that we may better evaluate this at her next visit.   Past Medical History:  Diagnosis Date  . Anemia   . Anxiety   . Arthritis    "left knee"  . Chronic lower back pain    "on the left side"  . Headache(784.0) 02/29/12   "frequent"  . Hypertension   . Migraines   . Pneumonia 2006  . Stress at home    "and at work"   Review of Systems:   Review of Systems  Constitutional: Negative for chills, diaphoresis and fever.  Cardiovascular: Negative for chest pain and leg swelling.  Gastrointestinal: Negative for nausea and vomiting.  Genitourinary: Negative for urgency.  Musculoskeletal: Positive for back pain. Negative for myalgias.  Neurological: Positive for headaches (Occaisionally. Self limiting ). Negative for dizziness.  Psychiatric/Behavioral: Negative for depression.   Physical Exam:  Vitals:   01/30/18 1531  BP: (!) 143/89  Pulse: 93  Temp: 98.6 F (37 C)  TempSrc: Oral  SpO2: 100%  Weight: 265 lb 8 oz (120.4 kg)   Physical Exam  Constitutional: She is oriented to person, place, and time. She appears well-developed and well-nourished. No distress.  HENT:  Head: Normocephalic and atraumatic.  Neck: Neck supple.  Cardiovascular: Normal rate and regular rhythm.  No murmur heard. Pulmonary/Chest: Effort normal and breath sounds normal. No stridor. No respiratory distress.  Abdominal: She exhibits no distension. There is no tenderness.  Musculoskeletal: She exhibits tenderness (As per HPI of the mass in the area of he right Rhomboid muscle). She  exhibits no edema.  Neurological: She is alert and oriented to person, place, and time.  Skin: Capillary refill takes less than 2 seconds. She is not diaphoretic.  Psychiatric: She has a normal mood and affect.  Vitals reviewed.  Assessment & Plan:   See Encounters Tab for problem based charting.  Patient seen with Dr. Cleda Daub

## 2018-02-01 ENCOUNTER — Ambulatory Visit (INDEPENDENT_AMBULATORY_CARE_PROVIDER_SITE_OTHER): Payer: Medicaid Other | Admitting: Obstetrics & Gynecology

## 2018-02-01 ENCOUNTER — Encounter: Payer: Self-pay | Admitting: Obstetrics & Gynecology

## 2018-02-01 VITALS — BP 129/92 | HR 80 | Wt 262.9 lb

## 2018-02-01 DIAGNOSIS — Z3046 Encounter for surveillance of implantable subdermal contraceptive: Secondary | ICD-10-CM | POA: Diagnosis not present

## 2018-02-01 DIAGNOSIS — Z319 Encounter for procreative management, unspecified: Secondary | ICD-10-CM

## 2018-02-01 NOTE — Progress Notes (Signed)
   Subjective:    Patient ID: [name] [name], female    DOB: [birth year], 47 y.o.   MRN: [id number]  HPI  47 yo P17 (67 yo son) here to have her Nexplanon removed as she would like to conceive.  Review of Systems     Objective:   Physical Exam  Breathing, conversing, and ambulating normally Well nourished, well hydrated Black female, no apparent distress Consent was signed and time out was done. Her left arm was prepped with betadine after establishing the position of the Nexplanon. The area was infiltrated with 2 cc of 1% lidocaine. A small incision was made and the intact rod was easily removed and noted to be intact.  A steristrip was placed and her arm was noted to be hemostatic. It was bandaged.  She tolerated the procedure well.     Assessment & Plan:  Desire for pregnancy- rec start MVI Rec that she see her primary care MD about her medications Rec healthy lifestyle habits

## 2018-02-03 ENCOUNTER — Encounter: Payer: Self-pay | Admitting: Internal Medicine

## 2018-02-04 NOTE — Progress Notes (Signed)
Internal Medicine Clinic Attending  Case discussed with Dr. Harbrecht at the time of the visit.  We reviewed the resident's history and exam and pertinent patient test results.  I agree with the assessment, diagnosis, and plan of care documented in the resident's note.   

## 2018-02-11 ENCOUNTER — Encounter: Payer: Self-pay | Admitting: *Deleted

## 2018-02-21 ENCOUNTER — Encounter: Payer: Self-pay | Admitting: Internal Medicine

## 2018-02-21 ENCOUNTER — Ambulatory Visit (INDEPENDENT_AMBULATORY_CARE_PROVIDER_SITE_OTHER): Payer: Medicaid Other | Admitting: Internal Medicine

## 2018-02-21 ENCOUNTER — Other Ambulatory Visit: Payer: Self-pay

## 2018-02-21 VITALS — BP 130/75 | HR 86 | Temp 98.1°F | Ht 62.0 in | Wt 263.9 lb

## 2018-02-21 DIAGNOSIS — Z791 Long term (current) use of non-steroidal anti-inflammatories (NSAID): Secondary | ICD-10-CM | POA: Diagnosis not present

## 2018-02-21 DIAGNOSIS — I10 Essential (primary) hypertension: Secondary | ICD-10-CM

## 2018-02-21 DIAGNOSIS — R7303 Prediabetes: Secondary | ICD-10-CM | POA: Diagnosis not present

## 2018-02-21 DIAGNOSIS — M25561 Pain in right knee: Secondary | ICD-10-CM | POA: Insufficient documentation

## 2018-02-21 DIAGNOSIS — M7651 Patellar tendinitis, right knee: Secondary | ICD-10-CM

## 2018-02-21 DIAGNOSIS — Z79899 Other long term (current) drug therapy: Secondary | ICD-10-CM

## 2018-02-21 DIAGNOSIS — S8991XA Unspecified injury of right lower leg, initial encounter: Secondary | ICD-10-CM

## 2018-02-21 LAB — GLUCOSE, CAPILLARY: GLUCOSE-CAPILLARY: 87 mg/dL (ref 65–99)

## 2018-02-21 LAB — POCT GLYCOSYLATED HEMOGLOBIN (HGB A1C): HEMOGLOBIN A1C: 6 % — AB (ref 4.0–5.6)

## 2018-02-21 NOTE — Progress Notes (Signed)
Internal Medicine Clinic Attending  Case discussed with Dr. Harbrecht at the time of the visit.  We reviewed the resident's history and exam and pertinent patient test results.  I agree with the assessment, diagnosis, and plan of care documented in the resident's note.   

## 2018-02-21 NOTE — Patient Instructions (Addendum)
FOLLOW-UP INSTRUCTIONS When: Please stop the front desk and schedule an appointment with me in 6 months. For: Routine visit What to bring: All your medications  Thank you for your visit to [facility] internal medicine clinic today.  I have obtained a lab to assess your renal function and will call you with any concerning results.  With regard to your knee I recommend that you continue taking the meloxicam previously prescribed and to avoid use as much as possible.  If this pain worsens, or you develop any other concerning features such as marked swelling of the right knee, warmth of the right knee as compared to the left, sudden onset weakness not associated with pain or other concerning features please notify us.

## 2018-02-21 NOTE — Assessment & Plan Note (Addendum)
  Acute right knee injury: Fell on knee while walking up three concrete steps. There was no angular torsion or fall from significant height. She denied striking her head, LOC or visual changes prior stating that she was carrying a bag of groceries and could not see the height of the step. Rest makes the pain better, she has not tried NSAIDS. The pain is mild, 3/10, with prominent stiffness after use that improves with rest. There is not loss of sensation distal to the knee, no abrasion, no ecchymosis, no erythema or edema, no joint laxity, no weakness, no sensation alterations distal either. She denied fever, chills, nausea, vomiting, diarrhea, myalgias or headache. I feel that her acute tendinitis will improve with time and does not warrant imaging currently.   Plan: Patient on meloxicam currently, continue Rest and elevated knee. Keep mobile Return if it worsens or fails to improve.

## 2018-02-21 NOTE — Progress Notes (Signed)
   CC: Routine HTN visit  HPI:[name] is a 47 y.o. female who presents today for a routine evaluation.   HTN: Greatly improved to 130/75 today. No acute issues. Tolerating medication well. We will check a BMP today and otherwise make no additional changes.  Plan: Continue Lisinopril  daily BMP today  Prediabetes: Last A1c 5.9 Plan: Repeat A1c today returned at 6.0.  I have continued to iterate the importance of dietary modifications and exercise.  Repeat A1c at 3 month intervals.  Will need to discuss metformin at her next visit as well as reinforcing dietary and exercise plans.   Acute right knee injury: Fell on knee while walking up three concrete steps. There was no angular torsion or fall from significant height. She denied striking her head, LOC or visual changes prior stating that she was carrying a bag of groceries and could not see the height of the step. Rest makes the pain better, she has not tried NSAIDS. The pain is mild, 3/10, with prominent stiffness after use that improves with rest. There is not loss of sensation distal to the knee, no abrasion, no ecchymosis, no erythema or edema, no joint laxity, no weakness, no sensation alterations distal either. She denied fever, chills, nausea, vomiting, diarrhea, myalgias or headache. I feel that her acute tendinitis will improve with time and does not warrant imaging currently.   Plan: Patient on meloxicam currently, continue Rest and elevated knee. Keep mobile Return if it worsens or fails to improve.    Past Medical History:  Diagnosis Date  . Anemia   . Anxiety   . Arthritis    "left knee"  . Chronic lower back pain    "on the left side"  . Headache(784.0) 02/29/12   "frequent"  . Hypertension   . Migraines   . Pneumonia 2006  . Stress at home    "and at work"   Review of Systems:  ROS negative except as per HPI.  Physical Exam:  Vitals:   02/21/18 0835  BP: 130/75  Pulse: 86  Temp: 98.1 F  (36.7 C)  TempSrc: Oral  SpO2: 99%  Weight: 263 lb 14.4 oz (119.7 kg)  Height:  (1.575 m)   Physical Exam  Constitutional: She is oriented to person, place, and time. She appears well-developed and well-nourished. No distress.  Cardiovascular: Normal rate and regular rhythm.  No murmur heard. Pulmonary/Chest: Effort normal and breath sounds normal. No stridor. No respiratory distress.  Abdominal: Soft.  Musculoskeletal: Normal range of motion. She exhibits no edema, tenderness or deformity.  Anterior and posterior drawer test negative.   Neurological: She is alert and oriented to person, place, and time.  Skin: Skin is warm. She is not diaphoretic.  Psychiatric: She has a normal mood and affect.   Assessment & Plan:   See Encounters Tab for problem based charting.  Patient discussed with Dr. Heide Spark

## 2018-02-21 NOTE — Assessment & Plan Note (Signed)
HTN: Greatly improved to 130/75 today. No acute issues. Tolerating medication well. We will check a BMP today and otherwise make no additional changes.  Plan: Continue Lisinopril  daily BMP today

## 2018-02-21 NOTE — Assessment & Plan Note (Signed)
Prediabetes: Last A1c 5.9 Plan: Repeat A1c today returned at 6.0.  I have continued to iterate the importance of dietary modifications and exercise.  Repeat A1c at 3 month intervals.  Will need to discuss metformin at her next visit as well as reinforcing dietary and exercise plans.

## 2018-02-22 LAB — BMP8+ANION GAP
Anion Gap: 15 mmol/L (ref 10.0–18.0)
BUN / CREAT RATIO: 13 (ref 9–23)
BUN: 10 mg/dL (ref 6–24)
CO2: 21 mmol/L (ref 20–29)
CREATININE: 0.77 mg/dL (ref 0.57–1.00)
Calcium: 9.1 mg/dL (ref 8.7–10.2)
Chloride: 106 mmol/L (ref 96–106)
GFR calc non Af Amer: 94 mL/min/{1.73_m2} (ref >59)
GFR, EST AFRICAN AMERICAN: 108 mL/min/{1.73_m2} (ref >59)
Glucose: 95 mg/dL (ref 65–99)
Potassium: 4.1 mmol/L (ref 3.5–5.2)
Sodium: 142 mmol/L (ref 134–144)

## 2018-02-28 ENCOUNTER — Other Ambulatory Visit: Payer: Self-pay | Admitting: Internal Medicine

## 2018-02-28 DIAGNOSIS — M5416 Radiculopathy, lumbar region: Secondary | ICD-10-CM

## 2018-03-11 ENCOUNTER — Emergency Department (HOSPITAL_COMMUNITY): Payer: Medicaid Other

## 2018-03-11 ENCOUNTER — Encounter (HOSPITAL_COMMUNITY): Payer: Self-pay

## 2018-03-11 ENCOUNTER — Other Ambulatory Visit: Payer: Self-pay

## 2018-03-11 ENCOUNTER — Emergency Department (HOSPITAL_COMMUNITY)
Admission: EM | Admit: 2018-03-11 | Discharge: 2018-03-11 | Disposition: A | Discharge status: Home / Self Care | Payer: Medicaid Other | Attending: Emergency Medicine | Admitting: Emergency Medicine

## 2018-03-11 DIAGNOSIS — Z79899 Other long term (current) drug therapy: Secondary | ICD-10-CM | POA: Insufficient documentation

## 2018-03-11 DIAGNOSIS — R7303 Prediabetes: Secondary | ICD-10-CM | POA: Diagnosis not present

## 2018-03-11 DIAGNOSIS — I10 Essential (primary) hypertension: Secondary | ICD-10-CM | POA: Insufficient documentation

## 2018-03-11 DIAGNOSIS — M25561 Pain in right knee: Secondary | ICD-10-CM | POA: Diagnosis not present

## 2018-03-11 MED ORDER — OXYCODONE-ACETAMINOPHEN 5-325 MG PO TABS
1.0000 | ORAL_TABLET | ORAL | Status: DC | PRN
  Administered 2018-03-11: 1 via ORAL
  Filled 2018-03-11: qty 1

## 2018-03-11 MED ORDER — PREDNISONE 10 MG (21) PO TBPK: ORAL_TABLET | Freq: Every day | ORAL | 0 refills | Status: DC

## 2018-03-11 MED ORDER — ONDANSETRON 4 MG PO TBDP
4.0000 mg | ORAL_TABLET | Freq: Once | ORAL | Status: AC
  Administered 2018-03-11: 4 mg via ORAL
  Filled 2018-03-11: qty 1
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
## 2018-03-11 NOTE — ED Notes (Signed)
Pt eating crackers

## 2018-03-11 NOTE — Discharge Instructions (Signed)
Start taking prednisone taper as prescribed.  Do not take Advil, Aleve, Mobic, Motrin, or ibuprofen while taking this medicine.  Use a walker or cane to help you walk around (he may purchase this at a medical supply store or secondhand store such as [employer] or the [employer]).  Elevate the knee when you are not walking.  Apply ice or heat for comfort.  You may continue taking your home medicines such as Flexeril and gabapentin.  Follow-up with your primary care physician or with an orthopedist for reevaluation of your symptoms.  Return to the emergency department immediately for any concerning signs or symptoms develop such as fevers, worsening swelling, numbness, weakness, or loss of pulses.

## 2018-03-11 NOTE — ED Triage Notes (Signed)
Pt states she has right leg pain behind her knee and on the top of her knee. No known injury. Has seen her PCP who did xrays but thinks she may have pulled muscle. Pt states she is unable to bear weight.

## 2018-03-12 ENCOUNTER — Encounter: Payer: Self-pay | Admitting: Internal Medicine

## 2018-03-20 ENCOUNTER — Ambulatory Visit (INDEPENDENT_AMBULATORY_CARE_PROVIDER_SITE_OTHER): Payer: Medicaid Other | Admitting: Internal Medicine

## 2018-03-20 ENCOUNTER — Other Ambulatory Visit: Payer: Self-pay

## 2018-03-20 VITALS — BP 115/74 | HR 85 | Temp 97.7°F | Ht 62.0 in | Wt 262.9 lb

## 2018-03-20 DIAGNOSIS — Z9181 History of falling: Secondary | ICD-10-CM | POA: Diagnosis not present

## 2018-03-20 DIAGNOSIS — M7651 Patellar tendinitis, right knee: Secondary | ICD-10-CM

## 2018-03-20 DIAGNOSIS — M5416 Radiculopathy, lumbar region: Secondary | ICD-10-CM | POA: Diagnosis not present

## 2018-03-20 DIAGNOSIS — M5412 Radiculopathy, cervical region: Secondary | ICD-10-CM

## 2018-03-20 DIAGNOSIS — M25461 Effusion, right knee: Secondary | ICD-10-CM

## 2018-03-20 DIAGNOSIS — Z791 Long term (current) use of non-steroidal anti-inflammatories (NSAID): Secondary | ICD-10-CM | POA: Diagnosis not present

## 2018-03-20 DIAGNOSIS — Z6841 Body Mass Index (BMI) 40.0 and over, adult: Secondary | ICD-10-CM | POA: Diagnosis not present

## 2018-03-20 DIAGNOSIS — I10 Essential (primary) hypertension: Secondary | ICD-10-CM | POA: Diagnosis not present

## 2018-03-20 DIAGNOSIS — Z79899 Other long term (current) drug therapy: Secondary | ICD-10-CM

## 2018-03-20 DIAGNOSIS — M25561 Pain in right knee: Secondary | ICD-10-CM

## 2018-03-20 DIAGNOSIS — E669 Obesity, unspecified: Secondary | ICD-10-CM | POA: Diagnosis not present

## 2018-03-20 NOTE — Progress Notes (Signed)
CC: Right posterior knee pain  HPI:  [name] is a 47 y.o. female with PMH as listed below including hypertension, cervical and lumbar radiculopathy, and obesity who presents for follow-up evaluation of right posterior knee pain.  Please see problem based charting for status of patient's chronic medical issues.  Posterior knee pain, right Patient reports fall about 4 weeks ago when walking up concrete steps with injury to her knee.  She was seen in clinic afterwards and diagnosed with acute tendinitis and continued on conservative therapy with NSAIDs and RICE.  Patient reports about 2 weeks ago developing pain at the back of her right knee while walking at work.  She felt like her leg gave out and had a pulling sensation at the back of her knee.  She had also noticed some swelling.  She was seen in the ED on 03/11/2018.  Plain films of her right knee showed mild medial and patellofemoral degenerative changes.  She is diagnosed with a flare of osteoarthritis and treated with a steroid taper and RICE therapy.  She was advised to hold her meloxicam while on prednisone.  Since then patient obtained a cane to assist with ambulation as well as a knee brace.  Patient states she has continued pain most notably when she extends her right leg after having it bent. She has had improvement in her pain with the use of Meloxicam previously. She also takes Gabapentin and Flexeril with benefit.  A/P: POCUS: Performed by Dr. Mikey BussingHoffman.  Trace patellar effusion noted, medial lateral meniscus without gross abnormalities.  Trace effusion seen posteriorly.  No obvious full-thickness tears of the proximal tendons of the gastrocnemius.  Patient likely with tendinitis of the proximal gastrocnemius along the medial condyle of the femur of the right leg. No obvious popliteal (Baker's) cyst/bursitis or rupture. Low suspicion for meniscal or ligament tear based on exam and POCUS. I have recommended continued  management with RICE, ice/heat, knee brace, and restarting Meloxicam for pain and inflammation. She is advised to continue use of cane to assist with ambulation. Suspect her symptoms will continue for 4-6 weeks. If no improvement after that time, we will consider referral to orthopedics for further evaluation.    Past Medical History:  Diagnosis Date  . Anemia   . Anxiety   . Arthritis    "left knee"  . Chronic lower back pain    "on the left side"  . Headache(784.0) 02/29/12   "frequent"  . Hypertension   . Left knee pain 11/04/2015  . Migraines   . Pneumonia 2006  . Stress at home    "and at work"   Review of Systems:   Review of Systems  Constitutional: Negative for chills and fever.  Respiratory: Negative for shortness of breath.   Cardiovascular: Negative for chest pain.  Musculoskeletal:       Fall 4 weeks ago.  Posterior right knee pain with swelling.     Physical Exam:  Vitals:   03/20/18 0904  BP: 115/74  Pulse: 85  Temp: 97.7 F (36.5 C)  TempSrc: Oral  SpO2: 98%  Weight: 262 lb 14.4 oz (119.3 kg)  Height: 5\' 2"  (1.575 m)   Physical Exam  Constitutional: She is oriented to person, place, and time. She appears well-developed and well-nourished. No distress.  Obese woman, using cane to ambulate  HENT:  Head: Normocephalic and atraumatic.  Cardiovascular: Normal rate.  Musculoskeletal:  Posterior knee tender to palpation along the proximal gastrocnemius tendon at the  medial condyle of the femur.  Slight swelling however difficult to assess due to the large habitus.  Small bruising seen on the posterior medial aspect of the knee.  No tenderness to palpation along the anterior knee.  Extension at the right knee decreased due to pain and weakness.  No erythema or increased warmth to touch.  Left knee without tenderness, effusion, erythema, increased warmth, or decreased range of motion. Antalgic gait.  POCUS: Performed by Dr. Mikey Bussing.  Trace patellar effusion  noted, medial lateral meniscus without gross abnormalities.  Trace effusion seen posteriorly.  No obvious full-thickness tears of the proximal tendons of the gastrocnemius.  Neurological: She is alert and oriented to person, place, and time.  Skin: She is not diaphoretic.  Psychiatric: She has a normal mood and affect.     Assessment & Plan:   See Encounters Tab for problem based charting.  Patient seen with Dr. Cleda Daub

## 2018-03-20 NOTE — Assessment & Plan Note (Addendum)
Patient reports fall about 4 weeks ago when walking up concrete steps with injury to her knee.  She was seen in clinic afterwards and diagnosed with acute tendinitis and continued on conservative therapy with NSAIDs and RICE.  Patient reports about 2 weeks ago developing pain at the back of her right knee while walking at work.  She felt like her leg gave out and had a pulling sensation at the back of her knee.  She had also noticed some swelling.  She was seen in the ED on 03/11/2018.  Plain films of her right knee showed mild medial and patellofemoral degenerative changes.  She is diagnosed with a flare of osteoarthritis and treated with a steroid taper and RICE therapy.  She was advised to hold her meloxicam while on prednisone.  Since then patient obtained a cane to assist with ambulation as well as a knee brace.  Patient states she has continued pain most notably when she extends her right leg after having it bent. She has had improvement in her pain with the use of Meloxicam previously. She also takes Gabapentin and Flexeril with benefit.  A/P: POCUS: Performed by Dr. Mikey BussingHoffman.  Trace patellar effusion noted, medial lateral meniscus without gross abnormalities.  Trace effusion seen posteriorly.  No obvious full-thickness tears of the proximal tendons of the gastrocnemius.  Patient likely with tendinitis of the proximal gastrocnemius along the medial condyle of the femur of the right leg. No obvious popliteal (Baker's) cyst/bursitis or rupture. Low suspicion for meniscal or ligament tear based on exam and POCUS. I have recommended continued management with RICE, ice/heat, knee brace, and restarting Meloxicam for pain and inflammation. She is advised to continue use of cane to assist with ambulation. Suspect her symptoms will continue for 4-6 weeks. If no improvement after that time, we will consider referral to orthopedics for further evaluation.

## 2018-03-20 NOTE — Patient Instructions (Signed)
It was a pleasure to see [name] [name].  It seems like you have tendinitis of the calf tendon behind your knee which is causing your pain.  Unfortunately this may take 4 to 6 weeks to improve.  Please restart her meloxicam for pain and inflammation.  Continue heat, ice, knee brace, and using the cane when walking.  If your pain does not improve over the next 4 to 6 weeks, we should consider sending you to an orthopedic doctor for further evaluation.

## 2018-03-25 NOTE — Progress Notes (Signed)
Internal Medicine Clinic Attending  I saw and evaluated the patient.  I personally confirmed the key portions of the history and exam documented by Dr. Patel and I reviewed pertinent patient test results.  The assessment, diagnosis, and plan were formulated together and I agree with the documentation in the resident's note.  

## 2018-04-04 ENCOUNTER — Encounter: Payer: Self-pay | Admitting: Internal Medicine

## 2018-04-05 ENCOUNTER — Other Ambulatory Visit: Payer: Self-pay

## 2018-04-05 ENCOUNTER — Ambulatory Visit (INDEPENDENT_AMBULATORY_CARE_PROVIDER_SITE_OTHER): Payer: Medicaid Other | Admitting: Internal Medicine

## 2018-04-05 VITALS — BP 139/98 | HR 114 | Temp 98.5°F | Ht 62.0 in | Wt 264.8 lb

## 2018-04-05 DIAGNOSIS — Z9181 History of falling: Secondary | ICD-10-CM

## 2018-04-05 DIAGNOSIS — G8929 Other chronic pain: Secondary | ICD-10-CM | POA: Diagnosis not present

## 2018-04-05 DIAGNOSIS — Z6841 Body Mass Index (BMI) 40.0 and over, adult: Secondary | ICD-10-CM | POA: Diagnosis not present

## 2018-04-05 DIAGNOSIS — M7651 Patellar tendinitis, right knee: Secondary | ICD-10-CM

## 2018-04-05 DIAGNOSIS — M25561 Pain in right knee: Secondary | ICD-10-CM

## 2018-04-05 NOTE — Patient Instructions (Signed)
It was great meeting you today. I'm sorry you are still dealing with pain in your knee.   I think you would benefit from an MRI of your knee to look for any damage to the bones, ligaments, tendons or muscles. I am also referring you to orthopedics as well. They will call you with the appointments  Please continue taking your medications as you are. You have an appointment with your primary care physician in 2 weeks.

## 2018-04-05 NOTE — Progress Notes (Signed)
   CC: follow-up of right knee pain  HPI:  Ms.Scottie E [name] is a 47 y.o. F with morbid obesity who presents today to discuss her worsening right knee pain. She has no other complaints.   For details regarding today's visit and the status of their chronic medical issues, please refer to the assessment and plan.  Past Medical History:  Diagnosis Date  . Anemia   . Anxiety   . Arthritis    "left knee"  . Chronic lower back pain    "on the left side"  . Headache(784.0) 02/29/12   "frequent"  . Hypertension   . Left knee pain 11/04/2015  . Migraines   . Pneumonia 2006  . Stress at home    "and at work"   Review of Systems  General: Denies fevers, chills, weight loss Cardiac: Denies CP, SOB, palpitations Pulmonary: Denies cough, wheezes Abd: Denies abdominal pain, changes in bowels Extremities: Admits to right knee and thigh pain. Admits to joint weakness. Admits to falls.   Physical Exam General: Morbidly obese female. Alert, in no acute distress. Pleasant and conversant but is frustrated over knee HEENT: No icterus, injection or ptosis. No hoarseness or dysarthria  Cardiac:Tachycardic but otherwise regular.  Pulmonary: CTA BL with normal WOB on RA. Able to speak in complete sentences Abd: Soft, non-tender. +bs Extremities: Warm, perfused. No significant pedal edema. RLE AROM limited by pain but does have some limitation with PROM. Has posterior knee tenderness and medial joint space tenderness. Ambulates with cane. BL LE reflexes intact.  Vitals:   04/05/18 1523 04/05/18 1614  BP: 131/85 (!) 139/98  Pulse: (!) 124 (!) 114  Temp: 98.5 F (36.9 C)   TempSrc: Oral   SpO2: 97%   Weight: 264 lb 12.8 oz (120.1 kg)   Height: 5\' 2"  (1.575 m)    Body mass index is 48.43 kg/m.  Assessment & Plan:   See Encounters Tab for problem based charting.  Patient discussed with Dr. Heide SparkNarendra
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
## 2018-04-08 ENCOUNTER — Encounter: Payer: Self-pay | Admitting: Internal Medicine

## 2018-04-08 NOTE — Progress Notes (Signed)
Internal Medicine Clinic Attending  Case discussed with Dr. Molt at the time of the visit.  We reviewed the resident's history and exam and pertinent patient test results.  I agree with the assessment, diagnosis, and plan of care documented in the resident's note. 

## 2018-04-09 ENCOUNTER — Ambulatory Visit (INDEPENDENT_AMBULATORY_CARE_PROVIDER_SITE_OTHER): Payer: Medicaid Other | Admitting: Orthopedic Surgery

## 2018-04-09 ENCOUNTER — Encounter (INDEPENDENT_AMBULATORY_CARE_PROVIDER_SITE_OTHER): Payer: Self-pay | Admitting: Orthopedic Surgery

## 2018-04-09 DIAGNOSIS — M25561 Pain in right knee: Secondary | ICD-10-CM

## 2018-04-09 NOTE — Progress Notes (Signed)
Office Visit Note   Patient: [name] [name]           Date of Birth: [birth year]           MRN: [id number] Visit Date: 04/09/2018 Requested by: Molt, [place], DO [address] [place], [place] [address] PCP: Lanelle Bal, MD  Subjective: Chief Complaint  Patient presents with  . Right Knee - Pain    HPI: [name] [name] is a patient with right knee pain.  Describes injuring her right knee while she was standing to clock in for work at [employer] on 03/11/2018.  She says that her boss actually took her to the ER.  Reports pain in the posterior aspect of the knee.  She states that it had been hurting for a while for about 6 months before that particular injury as well.  The history is a little bit murky.  She is been taking Mobic and Neurontin for the problem.  She works 5-hour shifts at [employer].  She does not report much in the way of mechanical symptoms but does report pain.              ROS: All systems reviewed are negative as they relate to the chief complaint within the history of present illness.  Patient denies  fevers or chills.   Assessment & Plan: Visit Diagnoses:  1. Acute pain of right knee     Plan: Impression is right knee pain with no effusion full range of motion stable collateral cruciate ligaments and not much in the way of arthritis in the medial or lateral compartment.  She does have some patellar spurring.  Plan at this time is MRI scan of the right knee to evaluate for possible meniscal pathology.  With her increased body mass index its possible she may have a meniscal root tear although she does not have much in the way of effusion.  She is having some posterior pain which would be consistent with a root tear.  I will see her back after her MRI scan.  May consider injection at that time if nothing definitive shows in terms of treatable lesions in the knee.  Follow-Up Instructions: Return for after MRI.   Orders:  Orders Placed This Encounter  Procedures  . MR  Knee Right w/o contrast   No orders of the defined types were placed in this encounter.     Procedures: No procedures performed   Clinical Data: No additional findings.  Objective: Vital Signs: There were no vitals taken for this visit.  Physical Exam:   Constitutional: Patient appears well-developed HEENT:  Head: Normocephalic Eyes:EOM are normal Neck: Normal range of motion Cardiovascular: Normal rate Pulmonary/chest: Effort normal Neurologic: Patient is alert Skin: Skin is warm Psychiatric: Patient has normal mood and affect    Ortho Exam: Ortho exam demonstrates increased body mass index.  She is walking with a cane.  Pedal pulses palpable.  No groin pain with internal/external rotation of either leg.  Ankle dorsiflexion plantarflexion intact.  On the right knee there is no effusion collateral and cruciates are stable range of motion is full extensor mechanism is intact there is no patellar apprehension.  Specialty Comments:  No specialty comments available.  Imaging: No results found.   PMFS History: Patient Active Problem List   Diagnosis Date Noted  . Posterior knee pain, right 02/21/2018  . Essential hypertension 01/30/2018  . Mass on back 01/30/2018  . Cervical radiculopathy 04/30/2017  . Pre-diabetes 02/16/2016  . OSA (obstructive sleep  apnea) 05/14/2015  . Chronic lumbar radiculopathy 11/03/2014  . Other allergic rhinitis 09/07/2014  . Health care maintenance 09/07/2014  . OBESITY, MORBID 09/20/2006  . Anemia, iron deficiency 08/09/2006   Past Medical History:  Diagnosis Date  . Anemia   . Anxiety   . Arthritis    "left knee"  . Chronic lower back pain    "on the left side"  . Headache(784.0) 02/29/12   "frequent"  . Hypertension   . Left knee pain 11/04/2015  . Migraines   . Pneumonia 2006  . Stress at home    "and at work"    Family History  Problem Relation Age of Onset  . Cancer Other     Past Surgical History:  Procedure  Laterality Date  . BREAST BIOPSY Left 07/29/2013  . CESAREAN SECTION  08/2008  . CHOLECYSTECTOMY  03/02/2012   Procedure: LAPAROSCOPIC CHOLECYSTECTOMY;  Surgeon: Cherylynn RidgesJames O Wyatt, MD;  [facility];  Service: General;;  . WISDOM TOOTH EXTRACTION     Social History   Occupational History  . Not on file  Tobacco Use  . Smoking status: Never Smoker  . Smokeless tobacco: Never Used  Substance and Sexual Activity  . Alcohol use: No    Alcohol/week: 0.0 oz  . Drug use: No  . Sexual activity: Not on file

## 2018-04-15 ENCOUNTER — Encounter: Payer: Self-pay | Admitting: *Deleted

## 2018-04-17 ENCOUNTER — Ambulatory Visit
Admission: RE | Admit: 2018-04-17 | Discharge: 2018-04-17 | Disposition: A | Discharge status: Home / Self Care | Payer: Medicaid Other | Source: Ambulatory Visit | Attending: Orthopedic Surgery | Admitting: Orthopedic Surgery

## 2018-04-17 ENCOUNTER — Telehealth (INDEPENDENT_AMBULATORY_CARE_PROVIDER_SITE_OTHER): Payer: Self-pay | Admitting: Orthopedic Surgery

## 2018-04-17 DIAGNOSIS — M25561 Pain in right knee: Secondary | ICD-10-CM

## 2018-04-17 NOTE — Telephone Encounter (Signed)
I tried calling patient. No answer. LMVM with details advising that information she had been provided about next appt was correct but was willing to work her in on Monday afternoon at 1230 if she could come in at that time. Asked her to please call our office back to confirm so we could enter this appt time for her.

## 2018-04-17 NOTE — Telephone Encounter (Signed)
Patient came by to state MRI is today and wants appt for MRI REVIEW.  The first opening is not until 8/19.  Please call and advise patient when she may be able to come it. Dr. August Saucerean is booked every day up til then.

## 2018-04-22 ENCOUNTER — Ambulatory Visit (INDEPENDENT_AMBULATORY_CARE_PROVIDER_SITE_OTHER): Payer: Medicaid Other | Admitting: Orthopedic Surgery

## 2018-04-22 ENCOUNTER — Encounter (INDEPENDENT_AMBULATORY_CARE_PROVIDER_SITE_OTHER): Payer: Self-pay | Admitting: Orthopedic Surgery

## 2018-04-22 DIAGNOSIS — G8929 Other chronic pain: Secondary | ICD-10-CM

## 2018-04-22 DIAGNOSIS — M79604 Pain in right leg: Secondary | ICD-10-CM

## 2018-04-22 DIAGNOSIS — M5441 Lumbago with sciatica, right side: Secondary | ICD-10-CM | POA: Diagnosis not present

## 2018-04-22 NOTE — Progress Notes (Deleted)
   CC: ***  HPI:[name] is a 47 y.o. ***  HTN:   Posterior right knee pain: MRI indicating borderline extrusion of the medial meniscus body with possible small radial tear of the posterior root and mild/moderate patellofemoral compartment osteoarthritis.   Plan:    Health Maintenance: HIV screening *** TDAP ***  Past Medical History:  Diagnosis Date  . Anemia   . Anxiety   . Arthritis    "left knee"  . Chronic lower back pain    "on the left side"  . Headache(784.0) 02/29/12   "frequent"  . Hypertension   . Left knee pain 11/04/2015  . Migraines   . Pneumonia 2006  . Stress at home    "and at work"   Review of Systems:  ***  Physical Exam:  There were no vitals filed for this visit. ***  Assessment & Plan:   See Encounters Tab for problem based charting.  Patient {GC/GE:3044014::"discussed with","seen with"} Dr. {NAMES:3044014::"Butcher","Granfortuna","E. Hoffman","Klima","Mullen","Narendra","Raines","Vincent"}

## 2018-04-22 NOTE — Progress Notes (Signed)
Office Visit Note   Patient: [name] [name]           Date of Birth: 13-May-1971           MRN: [id number] Visit Date: 04/22/2018 Requested by: Lanelle Bal, MD [address] [place], [place] [address] PCP: Lanelle Bal, MD  Subjective: Chief Complaint  Patient presents with  . Right Knee - Pain    HPI: [name] [name] is a patient with right knee pain.  She describes posterior right knee pain which does radiate up and down the leg.  She is using a cane.  She has had an MRI scan since I have last seen her.  She has some moderate patellofemoral arthritis but no real significant posterior knee findings including not much in the way of joint arthritis or meniscal pathology.  She is been taking Mobic gabapentin and Flexeril.  She has lumbar spine radiographs from 3 years ago which are essentially unremarkable in terms of arthritis or fracture.  She describes constant pain.  This is primarily posterior and radiates up into the buttock area.  She also describes some low back pain.              ROS: All systems reviewed are negative as they relate to the chief complaint within the history of present illness.  Patient denies  fevers or chills. Impression is underwhelming right knee MRI scan not enough to explain this fairly incapacitating  Assessment & Plan: Visit Diagnoses:  1. Pain in right leg   2. Chronic bilateral low back pain with right-sided sciatica     Plan: Posterior right knee pain she is been having.  Upon further discussion she is having radicular type symptoms going up and down the back of the leg.  She is trying to work during this.  I think based on the scan it looks like this may be referred pain from her back.  No groin pain today with exam.  Plan MRI scan lumbar spine with likely ESI to follow if right-sided lateralizing pathology can be found.  If not I would probably favor injection of the right knee but again the right knee really does not look like the pain generator  at this time.  Follow-Up Instructions: Return for after MRI.   Orders:  Orders Placed This Encounter  Procedures  . MR Lumbar Spine w/o contrast   No orders of the defined types were placed in this encounter.     Procedures: No procedures performed   Clinical Data: No additional findings.  Objective: Vital Signs: There were no vitals taken for this visit.  Physical Exam:   Constitutional: Patient appears well-developed HEENT:  Head: Normocephalic Eyes:EOM are normal Neck: Normal range of motion Cardiovascular: Normal rate Pulmonary/chest: Effort normal Neurologic: Patient is alert Skin: Skin is warm Psychiatric: Patient has normal mood and affect    Ortho Exam: Ortho exam demonstrates good cervical spine range of motion.  She has some pain with forward and lateral bending but no trochanteric tenderness.  No groin pain with internal/external rotation of the leg.  No knee effusion bilaterally and mild symmetric patellofemoral crepitus bilaterally.  No discrete tenderness to palpation over the hamstring attachments sites on the right leg.  No other masses lymphadenopathy or skin changes noted in that right knee region  Specialty Comments:  No specialty comments available.  Imaging: No results found.   PMFS History: Patient Active Problem List   Diagnosis Date Noted  . Posterior knee pain, right 02/21/2018  .  Essential hypertension 01/30/2018  . Mass on back 01/30/2018  . Cervical radiculopathy 04/30/2017  . Pre-diabetes 02/16/2016  . OSA (obstructive sleep apnea) 05/14/2015  . Chronic lumbar radiculopathy 11/03/2014  . Other allergic rhinitis 09/07/2014  . Health care maintenance 09/07/2014  . OBESITY, MORBID 09/20/2006  . Anemia, iron deficiency 08/09/2006   Past Medical History:  Diagnosis Date  . Anemia   . Anxiety   . Arthritis    "left knee"  . Chronic lower back pain    "on the left side"  . Headache(784.0) 02/29/12   "frequent"  . Hypertension    . Left knee pain 11/04/2015  . Migraines   . Pneumonia 2006  . Stress at home    "and at work"    Family History  Problem Relation Age of Onset  . Cancer Other     Past Surgical History:  Procedure Laterality Date  . BREAST BIOPSY Left 07/29/2013  . CESAREAN SECTION  08/2008  . CHOLECYSTECTOMY  03/02/2012   Procedure: LAPAROSCOPIC CHOLECYSTECTOMY;  Surgeon: Cherylynn RidgesJames O Wyatt, MD;  [facility];  Service: General;;  . WISDOM TOOTH EXTRACTION     Social History   Occupational History  . Not on file  Tobacco Use  . Smoking status: Never Smoker  . Smokeless tobacco: Never Used  Substance and Sexual Activity  . Alcohol use: No    Alcohol/week: 0.0 oz  . Drug use: No  . Sexual activity: Not on file

## 2018-04-24 ENCOUNTER — Encounter: Payer: Medicaid Other | Admitting: Internal Medicine

## 2018-05-06 ENCOUNTER — Telehealth (INDEPENDENT_AMBULATORY_CARE_PROVIDER_SITE_OTHER): Payer: Self-pay | Admitting: Orthopedic Surgery

## 2018-05-06 NOTE — Telephone Encounter (Signed)
Called patient left message on voicemail to return call concerning schedule appointment for MRI review

## 2018-05-14 ENCOUNTER — Other Ambulatory Visit: Payer: Medicaid Other

## 2018-05-14 ENCOUNTER — Inpatient Hospital Stay: Admission: RE | Admit: 2018-05-14 | Payer: Medicaid Other | Source: Ambulatory Visit

## 2018-05-22 ENCOUNTER — Ambulatory Visit (INDEPENDENT_AMBULATORY_CARE_PROVIDER_SITE_OTHER): Payer: Medicaid Other | Admitting: Orthopedic Surgery

## 2018-05-24 ENCOUNTER — Ambulatory Visit
Admission: RE | Admit: 2018-05-24 | Discharge: 2018-05-24 | Disposition: A | Discharge status: Home / Self Care | Payer: Medicaid Other | Source: Ambulatory Visit | Attending: Orthopedic Surgery | Admitting: Orthopedic Surgery

## 2018-05-24 ENCOUNTER — Ambulatory Visit
Admission: RE | Admit: 2018-05-24 | Discharge: 2018-05-24 | Disposition: A | Discharge status: Home / Self Care | Payer: Medicaid Other | Source: Ambulatory Visit | Attending: Internal Medicine | Admitting: Internal Medicine

## 2018-05-24 ENCOUNTER — Encounter: Payer: Self-pay | Admitting: *Deleted

## 2018-05-24 DIAGNOSIS — M79604 Pain in right leg: Secondary | ICD-10-CM

## 2018-05-24 DIAGNOSIS — M25561 Pain in right knee: Secondary | ICD-10-CM

## 2018-05-31 ENCOUNTER — Ambulatory Visit (INDEPENDENT_AMBULATORY_CARE_PROVIDER_SITE_OTHER): Payer: Medicaid Other | Admitting: Orthopedic Surgery

## 2018-06-12 ENCOUNTER — Encounter (INDEPENDENT_AMBULATORY_CARE_PROVIDER_SITE_OTHER): Payer: Self-pay | Admitting: Orthopedic Surgery

## 2018-06-12 ENCOUNTER — Ambulatory Visit (INDEPENDENT_AMBULATORY_CARE_PROVIDER_SITE_OTHER): Payer: Medicaid Other | Admitting: Orthopedic Surgery

## 2018-06-12 DIAGNOSIS — S838X1D Sprain of other specified parts of right knee, subsequent encounter: Secondary | ICD-10-CM | POA: Diagnosis not present

## 2018-06-13 ENCOUNTER — Encounter (INDEPENDENT_AMBULATORY_CARE_PROVIDER_SITE_OTHER): Payer: Self-pay | Admitting: Orthopedic Surgery

## 2018-06-13 NOTE — Progress Notes (Signed)
Office Visit Note   Patient: [name] [name]           Date of Birth: Jul 14, 1971           MRN: [id number] Visit Date: 06/12/2018 Requested by: Lanelle Bal, MD [address] [place], [place] [address] PCP: Lanelle Bal, MD  Subjective: Chief Complaint  Patient presents with  . Right Knee - Follow-up    HPI: Patient presents for follow-up of MRI scan of right knee and back.  She reports worsening of primarily posterior medial right knee pain.  Difficult for her to stand and walk for long periods of time.  Her knee is hurting more than her back.  No family or personal history of DVT or pulmonary embolism.  She works fast food 4-hour shifts.  MRI of the lumbar spine shows L4-5 disc bulge with mild to moderate foraminal stenosis.  MRI scan of that right knee demonstrates moderate patellofemoral arthritis but progression of meniscal root avulsion to a complete status at this time.  Not much in the way of arthritis in that joint.              ROS: All systems reviewed are negative as they relate to the chief complaint within the history of present illness.  Patient denies  fevers or chills.   Assessment & Plan: Visit Diagnoses:  1. Injury of meniscus of right knee, subsequent encounter     Plan: Impression is radial root medial meniscus tear which is complete.  Not much the way of arthritis in that knee.  Patient does have increased body mass index.  Discussed with her the risks and benefits of surgical intervention which would be an attempt at meniscal root repair.  The risk and benefits including but not limited to infection nerve vessel damage incomplete healing as well as potential for failure of the repair due primarily to her increased body mass index.  Nonetheless I think at this point due to her age and relative paucity of arthritis in the remaining portion of her knee that an attempt at root repair would be indicated.  Risk and benefits are discussed.  All questions  answered.  I do not think that her back is a big contributor at this time to this posterior knee pain.  We could consider some injections in the back after surgery and recovery from the knee.  Follow-Up Instructions: No follow-ups on file.   Orders:  No orders of the defined types were placed in this encounter.  No orders of the defined types were placed in this encounter.     Procedures: No procedures performed   Clinical Data: No additional findings.  Objective: Vital Signs: There were no vitals taken for this visit.  Physical Exam:   Constitutional: Patient appears well-developed HEENT:  Head: Normocephalic Eyes:EOM are normal Neck: Normal range of motion Cardiovascular: Normal rate Pulmonary/chest: Effort normal Neurologic: Patient is alert Skin: Skin is warm Psychiatric: Patient has normal mood and affect    Ortho Exam: Ortho exam demonstrates antalgic gait to the right.  No nerve retention signs.  Pedal pulses palpable.  Range of motion full in that right knee.  Trace effusion is present.  Patient does have medial joint line tenderness.  Extensor mechanism is intact.  Collateral and cruciate ligaments are stable.  Specialty Comments:  No specialty comments available.  Imaging: No results found.   PMFS History: Patient Active Problem List   Diagnosis Date Noted  . Posterior knee pain, right 02/21/2018  .  Essential hypertension 01/30/2018  . Mass on back 01/30/2018  . Cervical radiculopathy 04/30/2017  . Pre-diabetes 02/16/2016  . OSA (obstructive sleep apnea) 05/14/2015  . Chronic lumbar radiculopathy 11/03/2014  . Other allergic rhinitis 09/07/2014  . Health care maintenance 09/07/2014  . OBESITY, MORBID 09/20/2006  . Anemia, iron deficiency 08/09/2006   Past Medical History:  Diagnosis Date  . Anemia   . Anxiety   . Arthritis    "left knee"  . Chronic lower back pain    "on the left side"  . Headache(784.0) 02/29/12   "frequent"  .  Hypertension   . Left knee pain 11/04/2015  . Migraines   . Pneumonia 2006  . Stress at home    "and at work"    Family History  Problem Relation Age of Onset  . Cancer Other     Past Surgical History:  Procedure Laterality Date  . BREAST BIOPSY Left 07/29/2013  . CESAREAN SECTION  08/2008  . CHOLECYSTECTOMY  03/02/2012   Procedure: LAPAROSCOPIC CHOLECYSTECTOMY;  Surgeon: Cherylynn RidgesJames O Wyatt, MD;  [facility];  Service: General;;  . WISDOM TOOTH EXTRACTION     Social History   Occupational History  . Not on file  Tobacco Use  . Smoking status: Never Smoker  . Smokeless tobacco: Never Used  Substance and Sexual Activity  . Alcohol use: No    Alcohol/week: 0.0 standard drinks  . Drug use: No  . Sexual activity: Not on file

## 2018-06-14 ENCOUNTER — Other Ambulatory Visit (INDEPENDENT_AMBULATORY_CARE_PROVIDER_SITE_OTHER): Payer: Self-pay | Admitting: Orthopedic Surgery

## 2018-06-14 DIAGNOSIS — S838X1D Sprain of other specified parts of right knee, subsequent encounter: Secondary | ICD-10-CM

## 2018-06-17 ENCOUNTER — Telehealth (INDEPENDENT_AMBULATORY_CARE_PROVIDER_SITE_OTHER): Payer: Self-pay | Admitting: Orthopedic Surgery

## 2018-06-17 NOTE — Telephone Encounter (Signed)
8 weeks min

## 2018-06-17 NOTE — Telephone Encounter (Signed)
See other note in regards to this for further documentation.

## 2018-06-17 NOTE — Telephone Encounter (Signed)
Patient left a message and wanted to know how long she will be out of work after her surgery.  CB#[phone].  Thank you.

## 2018-06-17 NOTE — Telephone Encounter (Signed)
Patient is requesting a letter for [employer] (to help with her rent) stating when she is having surgery. Please include her approximate time out of work.   Surgery date is October 1st.   Attn: Bonita Quinctavia Bullock     Patient is also requesting the same type letter to give to her employer  [employer]  (general manager's name is Darien RamusAna)   Attn: Ana   Patient would like to PICK UP when these are available.  [phone]

## 2018-06-17 NOTE — Telephone Encounter (Signed)
Called and s/w patient, advised letters written and could pick up at front desk.

## 2018-06-17 NOTE — Telephone Encounter (Signed)
Ok for note? How long do you anticipate OOW?

## 2018-06-19 NOTE — Pre-Procedure Instructions (Signed)
NIKA YAZZIE  06/19/2018      [facility] - [place], [place] - [address] [place] [address] Phone: [phone] Fax: [phone]    Your procedure is scheduled on Oct. 1  Report to [facility] Admitting at 8:30 A.M.  Call this number if you have problems the morning of surgery:  [phone]   Remember:  Do not eat or drink after midnight.      Take these medicines the morning of surgery with A SIP OF WATER :              cyclobenzaprine (flexeril)             Duloxetine (cymbalta)             flonase nasal spray              Gabapentin (neurontin)             7 days prior to surgery STOP taking any Aspirin(unless otherwise instructed by your surgeon), Aleve, Naproxen, Ibuprofen, Motrin, Advil, Goody's, BC's, all herbal medications, fish oil, and all vitamins                               Do not wear jewelry, make-up or nail polish.  Do not wear lotions, powders, or perfumes, or deodorant.  Do not shave 48 hours prior to surgery.  Men may shave face and neck.  Do not bring valuables to the hospital.  [facility] is not responsible for any belongings or valuables.  Contacts, dentures or bridgework may not be worn into surgery.  Leave your suitcase in the car.  After surgery it may be brought to your room.  For patients admitted to the hospital, discharge time will be determined by your treatment team.  Patients discharged the day of surgery will not be allowed to drive home.    Special instructions:  [facility]- Preparing For Surgery  Before surgery, you can play an important role. Because skin is not sterile, your skin needs to be as free of germs as possible. You can reduce the number of germs on your skin by washing with CHG (chlorahexidine gluconate) Soap before surgery.  CHG is an antiseptic cleaner which kills germs and bonds with the skin to continue killing germs even after washing.     Oral Hygiene is also important to reduce your risk of infection.  Remember - BRUSH YOUR TEETH THE MORNING OF SURGERY WITH YOUR REGULAR TOOTHPASTE  Please do not use if you have an allergy to CHG or antibacterial soaps. If your skin becomes reddened/irritated stop using the CHG.  Do not shave (including legs and underarms) for at least 48 hours prior to first CHG shower. It is OK to shave your face.  Please follow these instructions carefully.   1. Shower the NIGHT BEFORE SURGERY and the MORNING OF SURGERY with CHG.   2. If you chose to wash your hair, wash your hair first as usual with your normal shampoo.  3. After you shampoo, rinse your hair and body thoroughly to remove the shampoo.  4. Use CHG as you would any other liquid soap. You can apply CHG directly to the skin and wash gently with a scrungie or a clean washcloth.   5. Apply the CHG Soap to your body ONLY FROM THE NECK DOWN.  Do not use on open  wounds or open sores. Avoid contact with your eyes, ears, mouth and genitals (private parts). Wash Face and genitals (private parts)  with your normal soap.  6. Wash thoroughly, paying special attention to the area where your surgery will be performed.  7. Thoroughly rinse your body with warm water from the neck down.  8. DO NOT shower/wash with your normal soap after using and rinsing off the CHG Soap.  9. Pat yourself dry with a CLEAN TOWEL.  10. Wear CLEAN PAJAMAS to bed the night before surgery, wear comfortable clothes the morning of surgery  11. Place CLEAN SHEETS on your bed the night of your first shower and DO NOT SLEEP WITH PETS.    Day of Surgery:  Do not apply any deodorants/lotions.  Please wear clean clothes to the hospital/surgery center.   Remember to brush your teeth WITH YOUR REGULAR TOOTHPASTE.    Please read over the following fact sheets that you were given. Coughing and Deep Breathing and Surgical Site Infection Prevention

## 2018-06-20 ENCOUNTER — Encounter (HOSPITAL_COMMUNITY): Payer: Self-pay

## 2018-06-20 ENCOUNTER — Encounter (HOSPITAL_COMMUNITY)
Admission: RE | Admit: 2018-06-20 | Discharge: 2018-06-20 | Disposition: A | Discharge status: Home / Self Care | Payer: Medicaid Other | Source: Ambulatory Visit | Attending: Orthopedic Surgery | Admitting: Orthopedic Surgery

## 2018-06-20 ENCOUNTER — Other Ambulatory Visit: Payer: Self-pay

## 2018-06-20 DIAGNOSIS — Z01812 Encounter for preprocedural laboratory examination: Secondary | ICD-10-CM | POA: Insufficient documentation

## 2018-06-20 HISTORY — DX: Sleep apnea, unspecified: G47.30

## 2018-06-20 LAB — BASIC METABOLIC PANEL
ANION GAP: 8 (ref 5–15)
BUN: 6 mg/dL (ref 6–20)
CHLORIDE: 110 mmol/L (ref 98–111)
CO2: 22 mmol/L (ref 22–32)
Calcium: 8.9 mg/dL (ref 8.9–10.3)
Creatinine, Ser: 0.78 mg/dL (ref 0.44–1.00)
GFR calc Af Amer: >60 mL/min (ref >60)
GLUCOSE: 94 mg/dL (ref 70–99)
POTASSIUM: 5.6 mmol/L — AB (ref 3.5–5.1)
Sodium: 140 mmol/L (ref 135–145)

## 2018-06-20 LAB — CBC
HEMATOCRIT: 39.8 % (ref 36.0–46.0)
HEMOGLOBIN: 11.9 g/dL — AB (ref 12.0–15.0)
MCH: 25.8 pg — ABNORMAL LOW (ref 26.0–34.0)
MCHC: 29.9 g/dL — AB (ref 30.0–36.0)
MCV: 86.1 fL (ref 78.0–100.0)
Platelets: 419 10*3/uL — ABNORMAL HIGH (ref 150–400)
RBC: 4.62 MIL/uL (ref 3.87–5.11)
RDW: 15.4 % (ref 11.5–15.5)
WBC: 8.3 10*3/uL (ref 4.0–10.5)

## 2018-06-20 NOTE — Pre-Procedure Instructions (Signed)
NASTASSJA WITKOP  06/20/2018      [facility] - [place], [place] - [address] [place] [address] Phone: [phone] Fax: [phone]    Your procedure is scheduled on Oct. 1  Report to [facility] Admitting at 8:30 A.M.  Call this number if you have problems the morning of surgery:  [phone]   Remember:  Do not eat or drink after midnight.      Take these medicines the morning of surgery with A SIP OF WATER :              cyclobenzaprine (flexeril) if needed             Duloxetine (cymbalta)             flonase nasal spray              Gabapentin (neurontin)              Ondansetron(Zofran) if needed)               Pantoprazole(Protonix)             7 days prior to surgery STOP taking any Aspirin (unless otherwise instructed by your surgeon), Aleve, Naproxen, Ibuprofen, Motrin, Advil, Goody's, BC's, all herbal medications, fish oil, and all vitamins, Mobic (Meloxicam)                               Do not wear jewelry, make-up or nail polish.  Do not wear lotions, powders, or perfumes, or deodorant.  Do not shave 48 hours prior to surgery.  Men may shave face and neck.  Do not bring valuables to the hospital.  [facility] is not responsible for any belongings or valuables.  Contacts, dentures or bridgework may not be worn into surgery.  Leave your suitcase in the car.  After surgery it may be brought to your room.  For patients admitted to the hospital, discharge time will be determined by your treatment team.  Patients discharged the day of surgery will not be allowed to drive home.    Special instructions:  [place]- Preparing For Surgery  Before surgery, you can play an important role. Because skin is not sterile, your skin needs to be as free of germs as possible. You can reduce the number of germs on your skin by washing with CHG (chlorahexidine gluconate) Soap before surgery.  CHG is  an antiseptic cleaner which kills germs and bonds with the skin to continue killing germs even after washing.    Oral Hygiene is also important to reduce your risk of infection.  Remember - BRUSH YOUR TEETH THE MORNING OF SURGERY WITH YOUR REGULAR TOOTHPASTE  Please do not use if you have an allergy to CHG or antibacterial soaps. If your skin becomes reddened/irritated stop using the CHG.  Do not shave (including legs and underarms) for at least 48 hours prior to first CHG shower. It is OK to shave your face.  Please follow these instructions carefully.   1. Shower the NIGHT BEFORE SURGERY and the MORNING OF SURGERY with CHG.   2. If you chose to wash your hair, wash your hair first as usual with your normal shampoo.  3. After you shampoo, rinse your hair and body thoroughly to remove the shampoo.  4. Use CHG as you would any other liquid soap. You can apply  CHG directly to the skin and wash gently with a scrungie or a clean washcloth.   5. Apply the CHG Soap to your body ONLY FROM THE NECK DOWN.  Do not use on open wounds or open sores. Avoid contact with your eyes, ears, mouth and genitals (private parts). Wash Face and genitals (private parts)  with your normal soap.  6. Wash thoroughly, paying special attention to the area where your surgery will be performed.  7. Thoroughly rinse your body with warm water from the neck down.  8. DO NOT shower/wash with your normal soap after using and rinsing off the CHG Soap.  9. Pat yourself dry with a CLEAN TOWEL.  10. Wear CLEAN PAJAMAS to bed the night before surgery, wear comfortable clothes the morning of surgery  11. Place CLEAN SHEETS on your bed the night of your first shower and DO NOT SLEEP WITH PETS.    Day of Surgery:  Do not apply any deodorants/lotions.  Please wear clean clothes to the hospital/surgery center.   Remember to brush your teeth WITH YOUR REGULAR TOOTHPASTE.    Please read over the following fact sheets that  you were given. Coughing and Deep Breathing and Surgical Site Infection Prevention

## 2018-06-20 NOTE — Progress Notes (Signed)
PCP  [name] Bal MD  Pt. Denies any cardiac history or cardiac testing.  CPAP   Sleep Study  08-15-2016  Epic

## 2018-06-24 MED ORDER — DEXTROSE 5 % IV SOLN
3.0000 g | INTRAVENOUS | Status: AC
  Administered 2018-06-25: 3 g via INTRAVENOUS
  Filled 2018-06-24: qty 3

## 2018-06-25 ENCOUNTER — Encounter (HOSPITAL_COMMUNITY)
Admission: RE | Disposition: A | Discharge status: Home / Self Care | Payer: Self-pay | Source: Ambulatory Visit | Attending: Orthopedic Surgery

## 2018-06-25 ENCOUNTER — Ambulatory Visit (HOSPITAL_COMMUNITY): Payer: Medicaid Other | Admitting: Anesthesiology

## 2018-06-25 ENCOUNTER — Ambulatory Visit (HOSPITAL_COMMUNITY)
Admission: RE | Admit: 2018-06-25 | Discharge: 2018-06-25 | Disposition: A | Discharge status: Home / Self Care | Payer: Medicaid Other | Source: Ambulatory Visit | Attending: Orthopedic Surgery | Admitting: Orthopedic Surgery

## 2018-06-25 ENCOUNTER — Encounter (HOSPITAL_COMMUNITY): Payer: Self-pay | Admitting: Anesthesiology

## 2018-06-25 DIAGNOSIS — X58XXXA Exposure to other specified factors, initial encounter: Secondary | ICD-10-CM | POA: Insufficient documentation

## 2018-06-25 DIAGNOSIS — Z79899 Other long term (current) drug therapy: Secondary | ICD-10-CM | POA: Diagnosis not present

## 2018-06-25 DIAGNOSIS — G4730 Sleep apnea, unspecified: Secondary | ICD-10-CM | POA: Insufficient documentation

## 2018-06-25 DIAGNOSIS — S83241A Other tear of medial meniscus, current injury, right knee, initial encounter: Secondary | ICD-10-CM | POA: Diagnosis present

## 2018-06-25 DIAGNOSIS — F419 Anxiety disorder, unspecified: Secondary | ICD-10-CM | POA: Insufficient documentation

## 2018-06-25 DIAGNOSIS — Z6841 Body Mass Index (BMI) 40.0 and over, adult: Secondary | ICD-10-CM | POA: Diagnosis not present

## 2018-06-25 DIAGNOSIS — S838X1D Sprain of other specified parts of right knee, subsequent encounter: Secondary | ICD-10-CM

## 2018-06-25 DIAGNOSIS — M1990 Unspecified osteoarthritis, unspecified site: Secondary | ICD-10-CM | POA: Insufficient documentation

## 2018-06-25 DIAGNOSIS — I10 Essential (primary) hypertension: Secondary | ICD-10-CM | POA: Insufficient documentation

## 2018-06-25 DIAGNOSIS — M25561 Pain in right knee: Secondary | ICD-10-CM | POA: Diagnosis not present

## 2018-06-25 HISTORY — PX: KNEE ARTHROSCOPY WITH MENISCAL REPAIR: SHX5653

## 2018-06-25 LAB — POCT I-STAT 4, (NA,K, GLUC, HGB,HCT)
GLUCOSE: 109 mg/dL — AB (ref 70–99)
HEMATOCRIT: 37 % (ref 36.0–46.0)
Hemoglobin: 12.6 g/dL (ref 12.0–15.0)
Potassium: 4 mmol/L (ref 3.5–5.1)
SODIUM: 141 mmol/L (ref 135–145)

## 2018-06-25 LAB — SURGICAL PCR SCREEN
MRSA, PCR: NEGATIVE
Staphylococcus aureus: NEGATIVE

## 2018-06-25 SURGERY — ARTHROSCOPY, KNEE, WITH MENISCUS REPAIR
Anesthesia: General | Laterality: Right

## 2018-06-25 MED ORDER — VANCOMYCIN HCL 10 G IV SOLR
1500.0000 mg | INTRAVENOUS | Status: AC
  Administered 2018-06-25: 1500 mg via INTRAVENOUS
  Filled 2018-06-25: qty 1500

## 2018-06-25 MED ORDER — FENTANYL CITRATE (PF) 100 MCG/2ML IJ SOLN
INTRAMUSCULAR | Status: AC
  Administered 2018-06-25: 100 ug via INTRAVENOUS
  Filled 2018-06-25: qty 2

## 2018-06-25 MED ORDER — ONDANSETRON HCL 4 MG/2ML IJ SOLN
INTRAMUSCULAR | Status: AC
  Filled 2018-06-25: qty 2

## 2018-06-25 MED ORDER — CLONIDINE HCL (ANALGESIA) 100 MCG/ML EP SOLN
EPIDURAL | Status: DC | PRN
  Administered 2018-06-25: 1 mL

## 2018-06-25 MED ORDER — MORPHINE SULFATE (PF) 4 MG/ML IV SOLN
INTRAVENOUS | Status: DC | PRN
  Administered 2018-06-25: 8 mg via SUBCUTANEOUS

## 2018-06-25 MED ORDER — MORPHINE SULFATE (PF) 4 MG/ML IV SOLN
INTRAVENOUS | Status: AC
  Filled 2018-06-25: qty 2

## 2018-06-25 MED ORDER — SODIUM CHLORIDE 0.9 % IR SOLN
Status: DC | PRN
  Administered 2018-06-25: 3000 mL

## 2018-06-25 MED ORDER — 0.9 % SODIUM CHLORIDE (POUR BTL) OPTIME
TOPICAL | Status: DC | PRN
  Administered 2018-06-25: 1000 mL

## 2018-06-25 MED ORDER — METOCLOPRAMIDE HCL 5 MG/ML IJ SOLN: 10.0000 mg | Freq: Once | INTRAMUSCULAR | Status: DC | PRN

## 2018-06-25 MED ORDER — ONDANSETRON HCL 4 MG/2ML IJ SOLN
INTRAMUSCULAR | Status: DC | PRN
  Administered 2018-06-25: 4 mg via INTRAVENOUS

## 2018-06-25 MED ORDER — DEXAMETHASONE SODIUM PHOSPHATE 10 MG/ML IJ SOLN
INTRAMUSCULAR | Status: DC | PRN
  Administered 2018-06-25: 5 mg via INTRAVENOUS

## 2018-06-25 MED ORDER — MUPIROCIN 2 % EX OINT
1.0000 "application " | TOPICAL_OINTMENT | Freq: Once | CUTANEOUS | Status: AC
  Administered 2018-06-25: 1 via TOPICAL

## 2018-06-25 MED ORDER — BUPIVACAINE HCL (PF) 0.25 % IJ SOLN
INTRAMUSCULAR | Status: AC
  Filled 2018-06-25: qty 30

## 2018-06-25 MED ORDER — ASPIRIN 81 MG PO CHEW: 81.0000 mg | CHEWABLE_TABLET | Freq: Two times a day (BID) | ORAL | 0 refills | Status: DC

## 2018-06-25 MED ORDER — PROPOFOL 10 MG/ML IV BOLUS
INTRAVENOUS | Status: DC | PRN
  Administered 2018-06-25: 200 mg via INTRAVENOUS

## 2018-06-25 MED ORDER — EPINEPHRINE PF 1 MG/ML IJ SOLN
INTRAMUSCULAR | Status: AC
  Filled 2018-06-25: qty 2

## 2018-06-25 MED ORDER — ROPIVACAINE HCL 7.5 MG/ML IJ SOLN
INTRAMUSCULAR | Status: DC | PRN
  Administered 2018-06-25: 20 mL via PERINEURAL

## 2018-06-25 MED ORDER — DEXAMETHASONE SODIUM PHOSPHATE 10 MG/ML IJ SOLN
INTRAMUSCULAR | Status: AC
  Filled 2018-06-25: qty 1

## 2018-06-25 MED ORDER — FENTANYL CITRATE (PF) 100 MCG/2ML IJ SOLN
INTRAMUSCULAR | Status: DC | PRN
  Administered 2018-06-25: 50 ug via INTRAVENOUS
  Administered 2018-06-25: 100 ug via INTRAVENOUS
  Administered 2018-06-25: 25 ug via INTRAVENOUS

## 2018-06-25 MED ORDER — FENTANYL CITRATE (PF) 100 MCG/2ML IJ SOLN
100.0000 ug | Freq: Once | INTRAMUSCULAR | Status: AC
  Administered 2018-06-25: 100 ug via INTRAVENOUS

## 2018-06-25 MED ORDER — LACTATED RINGERS IV SOLN
INTRAVENOUS | Status: DC
  Administered 2018-06-25: 10:00:00 via INTRAVENOUS

## 2018-06-25 MED ORDER — LIDOCAINE 2% (20 MG/ML) 5 ML SYRINGE
INTRAMUSCULAR | Status: DC | PRN
  Administered 2018-06-25: 50 mg via INTRAVENOUS

## 2018-06-25 MED ORDER — BUPIVACAINE-EPINEPHRINE (PF) 0.25% -1:200000 IJ SOLN
INTRAMUSCULAR | Status: AC
  Filled 2018-06-25: qty 30

## 2018-06-25 MED ORDER — MIDAZOLAM HCL 2 MG/2ML IJ SOLN
INTRAMUSCULAR | Status: AC
  Administered 2018-06-25: 2 mg via INTRAVENOUS
  Filled 2018-06-25: qty 2

## 2018-06-25 MED ORDER — ACETAMINOPHEN-CODEINE #3 300-30 MG PO TABS: 1.0000 | ORAL_TABLET | Freq: Four times a day (QID) | ORAL | 0 refills | Status: DC | PRN

## 2018-06-25 MED ORDER — FENTANYL CITRATE (PF) 100 MCG/2ML IJ SOLN
INTRAMUSCULAR | Status: AC
  Filled 2018-06-25: qty 2

## 2018-06-25 MED ORDER — EPINEPHRINE PF 1 MG/ML IJ SOLN
INTRAMUSCULAR | Status: DC | PRN
  Administered 2018-06-25: 2 mL

## 2018-06-25 MED ORDER — MUPIROCIN 2 % EX OINT
TOPICAL_OINTMENT | CUTANEOUS | Status: AC
  Administered 2018-06-25: 1 via TOPICAL
  Filled 2018-06-25: qty 22

## 2018-06-25 MED ORDER — FENTANYL CITRATE (PF) 100 MCG/2ML IJ SOLN
25.0000 ug | INTRAMUSCULAR | Status: DC | PRN
  Administered 2018-06-25: 25 ug via INTRAVENOUS

## 2018-06-25 MED ORDER — CHLORHEXIDINE GLUCONATE 4 % EX LIQD: 60.0000 mL | Freq: Once | CUTANEOUS | Status: DC

## 2018-06-25 MED ORDER — PROPOFOL 10 MG/ML IV BOLUS
INTRAVENOUS | Status: AC
  Filled 2018-06-25: qty 20

## 2018-06-25 MED ORDER — BUPIVACAINE-EPINEPHRINE (PF) 0.25% -1:200000 IJ SOLN
INTRAMUSCULAR | Status: DC | PRN
  Administered 2018-06-25: 20 mL

## 2018-06-25 MED ORDER — LIDOCAINE 2% (20 MG/ML) 5 ML SYRINGE
INTRAMUSCULAR | Status: AC
  Filled 2018-06-25: qty 5

## 2018-06-25 MED ORDER — MIDAZOLAM HCL 2 MG/2ML IJ SOLN
2.0000 mg | Freq: Once | INTRAMUSCULAR | Status: AC
  Administered 2018-06-25: 2 mg via INTRAVENOUS

## 2018-06-25 MED ORDER — BUPIVACAINE HCL (PF) 0.25 % IJ SOLN
INTRAMUSCULAR | Status: DC | PRN
  Administered 2018-06-25: 30 mL

## 2018-06-25 MED ORDER — MEPERIDINE HCL 50 MG/ML IJ SOLN: 6.2500 mg | INTRAMUSCULAR | Status: DC | PRN

## 2018-06-25 MED ORDER — HYDROCODONE-ACETAMINOPHEN 7.5-325 MG PO TABS: 1.0000 | ORAL_TABLET | Freq: Once | ORAL | Status: DC | PRN

## 2018-06-25 MED ORDER — FENTANYL CITRATE (PF) 250 MCG/5ML IJ SOLN
INTRAMUSCULAR | Status: AC
  Filled 2018-06-25: qty 5

## 2018-06-25 SURGICAL SUPPLY — 65 items
ALCOHOL 70% 16 OZ (MISCELLANEOUS) ×3 IMPLANT
BANDAGE ACE 6X5 VEL STRL LF (GAUZE/BANDAGES/DRESSINGS) IMPLANT
BANDAGE ESMARK 6X9 LF (GAUZE/BANDAGES/DRESSINGS) IMPLANT
BLADE CLIPPER SURG (BLADE) IMPLANT
BLADE CUTTER GATOR 3.5 (BLADE) IMPLANT
BLADE GREAT WHITE 4.2 (BLADE) ×2 IMPLANT
BLADE GREAT WHITE 4.2MM (BLADE) ×1
BLADE SURG 10 STRL SS (BLADE) ×3 IMPLANT
BLADE SURG 15 STRL LF DISP TIS (BLADE) ×1 IMPLANT
BLADE SURG 15 STRL SS (BLADE) ×2
BNDG ESMARK 6X9 LF (GAUZE/BANDAGES/DRESSINGS)
BUR OVAL 6.0 (BURR) IMPLANT
COVER MAYO STAND STRL (DRAPES) ×3 IMPLANT
COVER SURGICAL LIGHT HANDLE (MISCELLANEOUS) ×3 IMPLANT
COVER WAND RF STERILE (DRAPES) ×3 IMPLANT
CUFF TOURNIQUET SINGLE 34IN LL (TOURNIQUET CUFF) IMPLANT
CUFF TOURNIQUET SINGLE 44IN (TOURNIQUET CUFF) IMPLANT
DRAPE ARTHROSCOPY W/POUCH 114 (DRAPES) ×3 IMPLANT
DRAPE HALF SHEET 40X57 (DRAPES) IMPLANT
DRAPE INCISE IOBAN 66X45 STRL (DRAPES) IMPLANT
DRAPE U-SHAPE 47X51 STRL (DRAPES) ×3 IMPLANT
DRSG TEGADERM 4X4.75 (GAUZE/BANDAGES/DRESSINGS) ×3 IMPLANT
DURAPREP 26ML APPLICATOR (WOUND CARE) ×3 IMPLANT
ELECT REM PT RETURN 9FT ADLT (ELECTROSURGICAL) ×3
ELECTRODE REM PT RTRN 9FT ADLT (ELECTROSURGICAL) ×1 IMPLANT
GAUZE SPONGE 4X4 12PLY STRL (GAUZE/BANDAGES/DRESSINGS) ×3 IMPLANT
GAUZE XEROFORM 1X8 LF (GAUZE/BANDAGES/DRESSINGS) ×3 IMPLANT
GLOVE BIOGEL PI IND STRL 7.5 (GLOVE) ×1 IMPLANT
GLOVE BIOGEL PI IND STRL 8 (GLOVE) ×1 IMPLANT
GLOVE BIOGEL PI INDICATOR 7.5 (GLOVE) ×2
GLOVE BIOGEL PI INDICATOR 8 (GLOVE) ×2
GLOVE ECLIPSE 7.0 STRL STRAW (GLOVE) ×3 IMPLANT
GLOVE SURG ORTHO 8.0 STRL STRW (GLOVE) ×3 IMPLANT
GOWN STRL REUS W/ TWL LRG LVL3 (GOWN DISPOSABLE) ×2 IMPLANT
GOWN STRL REUS W/ TWL XL LVL3 (GOWN DISPOSABLE) ×1 IMPLANT
GOWN STRL REUS W/TWL LRG LVL3 (GOWN DISPOSABLE) ×4
GOWN STRL REUS W/TWL XL LVL3 (GOWN DISPOSABLE) ×2
KIT BASIN OR (CUSTOM PROCEDURE TRAY) ×3 IMPLANT
KIT TURNOVER KIT B (KITS) ×3 IMPLANT
MANIFOLD NEPTUNE II (INSTRUMENTS) IMPLANT
NEEDLE 18GX1X1/2 (RX/OR ONLY) (NEEDLE) IMPLANT
NEEDLE HYPO 25GX1X1/2 BEV (NEEDLE) ×3 IMPLANT
NEEDLE SUT 2-0 SCORPION KNEE (NEEDLE) ×3 IMPLANT
NS IRRIG 1000ML POUR BTL (IV SOLUTION) ×3 IMPLANT
PACK ARTHROSCOPY DSU (CUSTOM PROCEDURE TRAY) ×3 IMPLANT
PAD ARMBOARD 7.5X6 YLW CONV (MISCELLANEOUS) ×6 IMPLANT
PADDING CAST COTTON 6X4 STRL (CAST SUPPLIES) ×3 IMPLANT
PENCIL BUTTON HOLSTER BLD 10FT (ELECTRODE) ×3 IMPLANT
SET ARTHROSCOPY TUBING (MISCELLANEOUS) ×2
SET ARTHROSCOPY TUBING LN (MISCELLANEOUS) ×1 IMPLANT
SOL PREP POV-IOD 4OZ 10% (MISCELLANEOUS) ×3 IMPLANT
SPONGE LAP 4X18 RFD (DISPOSABLE) ×3 IMPLANT
SUT ETHILON 3 0 PS 1 (SUTURE) IMPLANT
SUT FIBERWIRE 2-0 18 17.9 3/8 (SUTURE)
SUT MENISCAL KIT (KITS) IMPLANT
SUTURE FIBERWR 2-0 18 17.9 3/8 (SUTURE) IMPLANT
SYR 20ML ECCENTRIC (SYRINGE) ×3 IMPLANT
SYR CONTROL 10ML LL (SYRINGE) IMPLANT
SYR TB 1ML LUER SLIP (SYRINGE) ×3 IMPLANT
TOWEL OR 17X24 6PK STRL BLUE (TOWEL DISPOSABLE) ×3 IMPLANT
TOWEL OR 17X26 10 PK STRL BLUE (TOWEL DISPOSABLE) ×3 IMPLANT
TUBE CONNECTING 12'X1/4 (SUCTIONS) ×1
TUBE CONNECTING 12X1/4 (SUCTIONS) ×2 IMPLANT
WAND HAND CNTRL MULTIVAC 90 (MISCELLANEOUS) IMPLANT
WATER STERILE IRR 1000ML POUR (IV SOLUTION) ×3 IMPLANT

## 2018-06-25 NOTE — Anesthesia Procedure Notes (Addendum)
Anesthesia Regional Block: Adductor canal block   Pre-Anesthetic Checklist: ,, timeout performed,,,,,,,,,,,,  Laterality: Right  Prep: chloraprep       Needles:  Injection technique: Single-shot  Needle Type: Echogenic Stimulator Needle     Needle Length: 10cm  Needle Gauge: 21   Needle insertion depth: 9 cm   Additional Needles:   Procedures:,,,, ultrasound used (permanent image in chart),,,,  Narrative:  Start time: 06/25/2018 9:26 AM End time: 06/25/2018 9:32 AM Injection made incrementally with aspirations every 5 mL.  Performed by: Personally  Anesthesiologist: Mal Amabile, MD  Additional Notes: Timeout performed. Patient sedated. Relevant anatomy ID'd using [place]. Incremental 2-75ml injection of LA with frequent aspiration. Patient tolerated procedure well.        Right Adductor Canal Block

## 2018-06-25 NOTE — Op Note (Signed)
NAME: [name] [name], [name] [name] MEDICAL RECORD JY:[id number] ACCOUNT [id number] DATE OF BIRTH:August 14, 1971 FACILITY: [facility] LOCATION: [facility] PHYSICIAN:Marcina Kinnison Diamantina Providence, MD  OPERATIVE REPORT  DATE OF PROCEDURE:  06/25/2018  PREOPERATIVE DIAGNOSIS:  Right knee medial meniscal root tear.  POSTOPERATIVE DIAGNOSIS:  Right knee medial meniscal root tear.  PROCEDURE:  Right knee medial meniscal root partial tear debridement.  SURGEON:  Cammy Copa, MD  ASSISTANT:  April Green, RNFA.  INDICATIONS:  This is a 47 year old patient with increased body mass index and meniscal root tear.  She presents now for operative management after explanation of risks and benefits.  An attempt at meniscal root repair will be made if it is possible.  PROCEDURE IN DETAIL:  The patient was brought to the operating room where general anesthetic was induced.  Preoperative antibiotics administered.  Timeout was called.  Right leg was placed into the leg holder and then prescrubbed with alcohol and  Betadine, allowed to air dry prepped with DuraPrep solution and draped in a sterile manner.  The patient had full range of motion and good stability to varus and valgus stress as well as stable ACL and PCL on preoperative examination.  Anterior  inferolateral and anterior inferomedial portal sites were anesthetized with Marcaine with epinephrine.  Anterior inferior lateral portal was established.  Anterior inferior medial portal was established under direct visualization.  Diagnostic arthroscopy  was performed.  Patellofemoral compartment was intact.  The lateral compartment articular cartilage and meniscus was intact.  ACL, PCL was intact.  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  Loose chondral flaps were debrided, but these were more of a flaky type flap as opposed to a full thickness crater type flap.  The patient did have a tear of  the meniscal root, but it was not a complete tear.  I would say  there was about 20% of the attachment site of the meniscus to the meniscal root.  Consideration was given towards proceeding with repair nonetheless.  However, because of the general tightness of her knee, the instruments would not fit into that  posterior compartment.  This was attempted both with the Scorpion suture passer as well as with the meniscal root guide.  Because of this tightness in the posteromedial aspect of the knee, it was elected to just debride that portion of that posterior  horn medial meniscal tear which was torn.  This was done with the shaver and basket punch.  In general, the meniscus did not appear extruded with flexion and extension.  Following debridement of that meniscal tearing and removal of some of those loose  chondral flakes, the knee joint was thoroughly irrigated.  Instruments were removed and the portals were closed using 3-0 nylon.  A solution of Marcaine, morphine, clonidine used to numb up the portals as well as the knee joint itself.  Bulky dressing  applied.  The patient tolerated the procedure well without immediate complications and transferred to the recovery room in stable condition.  TN/NUANCE  D:06/25/2018 T:06/25/2018 JOB:[id number]

## 2018-06-25 NOTE — H&P (Signed)
[name] [name] is an 47 y.o. female.   Chief Complaint: Right knee pain HPI: [name] [name] is a 47 year old patient with right knee pain.  Sustained subsequent injuries this year and has meniscal root avulsion by MRI scanning.  Not much in the way of arthritis in that knee.  She does report primarily medial sided knee pain without any instability.  She has failed conservative measures and presents now for operative management after isolation risk and benefits.  No personal or family history of DVT or pulmonary embolism.  Past Medical History:  Diagnosis Date  . Anemia   . Anxiety   . Arthritis    "left knee"  . Chronic lower back pain    "on the left side"  . Headache(784.0) 02/29/12   "frequent"  . Hypertension   . Left knee pain 11/04/2015  . Migraines   . Pneumonia 2006  . Sleep apnea    CPAP  . Stress at home    "and at work"    Past Surgical History:  Procedure Laterality Date  . BREAST BIOPSY Left 07/29/2013  . CESAREAN SECTION  08/2008  . CHOLECYSTECTOMY  03/02/2012   Procedure: LAPAROSCOPIC CHOLECYSTECTOMY;  Surgeon: Cherylynn Ridges, MD;  Location: [facility] OR;  Service: General;;  . WISDOM TOOTH EXTRACTION      Family History  Problem Relation Age of Onset  . Cancer Other    Social History:  reports that she has never smoked. She has never used smokeless tobacco. She reports that she does not drink alcohol or use drugs.  Allergies:  Allergies  Allergen Reactions  . Mushroom Extract Complex Hives and Itching    Mushroom as a whole the pt is allergic to.    Medications Prior to Admission  Medication Sig Dispense Refill  . cyclobenzaprine (FLEXERIL) 5 MG tablet TAKE 1 TABLET BY MOUTH ONCE DAILY AS NEEDED FOR MUSCLE SPASM (Patient taking differently: Take 5 mg by mouth 2 (two) times daily. ) 30 tablet 2  . DULoxetine (CYMBALTA) 60 MG capsule TAKE ONE CAPSULE  BY MOUTH AS DIRECTED (Patient taking differently: Take 60 mg by mouth daily. TAKE ONE CAPSULE  BY MOUTH AS DIRECTED) 90  capsule 0  . gabapentin (NEURONTIN) 300 MG capsule Take 300 mg in the morning and 900 mg (3 tablets) at night. (Patient taking differently: Take 300-900 mg by mouth See admin instructions. Take 300 mg in the morning and 900 mg at night.) 360 capsule 1  . lisinopril (PRINIVIL,ZESTRIL) 5 MG tablet Take 1 tablet (5 mg total) by mouth daily. 30 tablet 11  . meloxicam (MOBIC) 15 MG tablet Take 1 tablet (15 mg total) by mouth daily. 30 tablet 2  . famotidine (PEPCID) 20 MG tablet Take 1 tablet (20 mg total) by mouth 2 (two) times daily for 5 days. (Patient not taking: Reported on 06/14/2018) 10 tablet 0  . fluticasone (FLONASE) 50 MCG/ACT nasal spray Place 2 sprays into both nostrils daily. 16 g 2  . ondansetron (ZOFRAN ODT) 4 MG disintegrating tablet Take 1 tablet (4 mg total) by mouth every 8 (eight) hours as needed for nausea or vomiting. (Patient not taking: Reported on 06/14/2018) 20 tablet 0  . pantoprazole (PROTONIX) 20 MG tablet Take 1 tablet (20 mg total) by mouth daily. (Patient not taking: Reported on 06/14/2018) 30 tablet 1    Results for orders placed or performed during the hospital encounter of 06/25/18 (from the past 48 hour(s))  I-STAT 4, (NA,K, GLUC, HGB,HCT)     Status:  Abnormal   Collection Time: 06/25/18  8:51 AM  Result Value Ref Range   Sodium 141 135 - 145 mmol/L   Potassium 4.0 3.5 - 5.1 mmol/L   Glucose, Bld 109 (H) 70 - 99 mg/dL   HCT 82.9 56.2 - 13.0 %   Hemoglobin 12.6 12.0 - 15.0 g/dL   No results found.  Review of Systems  Musculoskeletal: Positive for joint pain.    Blood pressure 111/69, pulse 73, temperature 98.1 F (36.7 C), temperature source Oral, resp. rate 20, height 5\' 2"  (1.575 m), weight 120.5 kg, SpO2 100 %. Physical Exam  Constitutional: She appears well-developed.  HENT:  Head: Normocephalic.  Eyes: Pupils are equal, round, and reactive to light.  Neck: Normal range of motion.  Cardiovascular: Normal rate.  Respiratory: Effort normal.   Neurological: She is alert.  Skin: Skin is warm.  Psychiatric: She has a normal mood and affect.  Right knee demonstrates medial joint line tenderness with mild effusion.  Collateral cruciate ligaments are stable.  Extensor mechanism is intact.  No calf tenderness to direct palpation.  Pedal pulses palpable.  Assessment/Plan Impression is meniscal root avulsion with fairly minimal arthritis in a 47 year old patient.  This is a significant injury.  Plan is arthroscopic meniscal root repair.  This may or may not be successful in preventing osteoarthritic development particularly with her BMI.  Nonetheless she wants to try for a procedure which would hopefully delay her need for total knee replacement or at least partial knee replacement.  Patient will have to undergo restricted weightbearing.  Again I did describe to her in terms of the risk and benefits that her increased body mass index put her at fairly significant risk of failure with this attempted procedure; nonetheless this is a salvage procedure to try to prevent her from developing fairly rapid arthritis in that medial compartment.  Burnard Bunting, MD 06/25/2018, 10:31 AM

## 2018-06-25 NOTE — Anesthesia Procedure Notes (Signed)
Procedure Name: LMA Insertion Date/Time: 06/25/2018 10:57 AM Performed by: Shireen Quan, CRNA Pre-anesthesia Checklist: Patient identified, Emergency Drugs available, Suction available and Patient being monitored Patient Re-evaluated:Patient Re-evaluated prior to induction Oxygen Delivery Method: Circle System Utilized Preoxygenation: Pre-oxygenation with 100% oxygen Induction Type: IV induction Ventilation: Mask ventilation without difficulty LMA: LMA inserted LMA Size: 4.0 Number of attempts: 1 Placement Confirmation: positive ETCO2 Tube secured with: Tape Dental Injury: Teeth and Oropharynx as per pre-operative assessment

## 2018-06-25 NOTE — Transfer of Care (Signed)
Immediate Anesthesia Transfer of Care Note  Patient: [name] [name]  Procedure(s) Performed: RIGHT KNEE ARTHROSCOPY WITH MENISCAL ROOT REPAIR (Right )  Patient Location: PACU  Anesthesia Type:GA combined with regional for post-op pain  Level of Consciousness: awake and patient cooperative  Airway & Oxygen Therapy: Patient Spontanous Breathing and Patient connected to nasal cannula oxygen  Post-op Assessment: Report given to RN, Post -op Vital signs reviewed and stable and Patient moving all extremities  Post vital signs: Reviewed and stable  Last Vitals:  Vitals Value Taken Time  BP 119/51 06/25/2018 12:13 PM  Temp    Pulse 95 06/25/2018 12:15 PM  Resp 22 06/25/2018 12:15 PM  SpO2 100 % 06/25/2018 12:15 PM  Vitals shown include unvalidated device data.  Last Pain:  Vitals:   06/25/18 0945  TempSrc:   PainSc: 0-No pain         Complications: No apparent anesthesia complications

## 2018-06-25 NOTE — Brief Op Note (Signed)
06/25/2018  12:29 PM  PATIENT:  [name] [name]  47 y.o. female  PRE-OPERATIVE DIAGNOSIS:  right knee meniscal root tear  POST-OPERATIVE DIAGNOSIS:  right knee meniscal root tear  PROCEDURE:  Procedure(s): RIGHT KNEE ARTHROSCOPY WITH MENISCAL ROOT debridement  SURGEON:  Surgeon(s): Cammy Copa, MD  ASSISTANT: April green  ANESTHESIA:   general  EBL: 1 ml    Total I/O In: 500 [I.V.:500] Out: 30 [Blood:30]  BLOOD ADMINISTERED: none  DRAINS: none   LOCAL MEDICATIONS USED: Marcaine morphine clonidine  SPECIMEN:  No Specimen  COUNTS:  YES  TOURNIQUET:  * No tourniquets in log *  DICTATION: .Other Dictation: Dictation Number patient [name] [name] preop diagnosis right knee medial meniscal root tear postop diagnosis same procedure right knee medial meniscal root partial tear debridement Surgeon Jorja Loa is having a cystic agreed on indications [name] [name] is a 47 year old patient with increased body mass index and meniscal root tear.  She presents now for operative management after explanation of risks and benefits.  An attempt at meniscal root repair will be made if there is possible.  Procedure detail patient was brought to the operating room where general anesthetic was induced preoperative isometric timeout was called right leg was placed into the leg holder and then prescribed alcohol Betadine allowed  Prep DuraPrep solution and draped in sterile manner.  Patient had full range of motion good stability to varus and valgus stress as well as stable ACL and PCL and preop examination.  Anterior inferior lateral anterior inferior medial portal sites were anesthetized with Marcaine with epinephrine.  Anterior inferior lateral portal was established anterior inferior medial portals established with direct visualization diagnostic arthroscopy was performed.  Patellofemoral compartment was intact lateral compartment articular cartilage and meniscus was intact.  ACL PCL was intact.  The  patient had early grade I-II chondromalacia over about 50% of the weightbearing surface area of that medial femoral.  Loose chondral flaps were debrided but these were more of a flaky type flap as opposed to a full-thickness crater type flap.  The patient did have a tear that meniscal root but it was not a complete tear.  I would say there was about 20% of the attachment site of that meniscus to the meniscal root.  Consideration was given towards proceeding with repair nonetheless.  However, the because of the general tightness of her knee the instruments would not fit into that posterior compartment.  This was attempted both with the scorpion suture passer as well as with the meniscal root guide.  Because of this tightness in the posterior medial aspect of the knee it was elected to just debride that portion of the posterior horn medial meniscal tear which was torn.  This was done with a shaver and basket punch.  In general the meniscus did not appear extruded with flexion and extension.  Following debridement of that meniscal tearing removal of some of his loose chondral flakes the knee joint was thoroughly irrigated instruments were removed and the portals are closed using 3-0 nylon.  A solution of Marcaine morphine clonidine needs to numb up the portals as well as the as well as the knee joint itself.  Bulky dressing applied.  Patient tolerated procedure well without any complication and transferred to the recovery room in stable condition [id number] dictation number PLAN OF CARE: Discharge to home after PACU  PATIENT DISPOSITION:  PACU - hemodynamically stable

## 2018-06-25 NOTE — Progress Notes (Signed)
Orthopedic Tech Progress Note Patient Details:  [name] [name] [birth year] [id number]  Ortho Devices Type of Ortho Device: Crutches   Post Interventions Patient Tolerated: Ambulated well Instructions Provided: Care of device   Saul Fordyce 06/25/2018, 1:51 PM

## 2018-06-25 NOTE — Anesthesia Postprocedure Evaluation (Signed)
Anesthesia Post Note  Patient: [name] [name]  Procedure(s) Performed: RIGHT KNEE ARTHROSCOPY WITH MENISCAL ROOT REPAIR (Right )     Patient location during evaluation: PACU Anesthesia Type: General Level of consciousness: awake and alert and oriented Pain management: pain level controlled Vital Signs Assessment: post-procedure vital signs reviewed and stable Respiratory status: spontaneous breathing, nonlabored ventilation and respiratory function stable Cardiovascular status: blood pressure returned to baseline and stable Postop Assessment: no apparent nausea or vomiting Anesthetic complications: no    Last Vitals:  Vitals:   06/25/18 1315 06/25/18 1327  BP: (!) 102/56 (!) 110/57  Pulse: 81 81  Resp: 20 18  Temp: (!) 36.4 C   SpO2: 97% 98%    Last Pain:  Vitals:   06/25/18 1315  TempSrc:   PainSc: 3                  Kardell Virgil A.

## 2018-06-25 NOTE — Anesthesia Preprocedure Evaluation (Addendum)
Anesthesia Evaluation  Patient identified by MRN, date of birth, ID band Patient awake    Reviewed: Allergy & Precautions, NPO status , Patient's Chart, lab work & pertinent test results  Airway Mallampati: III  TM Distance: >3 FB Neck ROM: Full    Dental  (+) Missing   Pulmonary sleep apnea and Continuous Positive Airway Pressure Ventilation , pneumonia, resolved,    Pulmonary exam normal breath sounds clear to auscultation       Cardiovascular hypertension, Pt. on medications Normal cardiovascular exam Rhythm:Regular Rate:Normal     Neuro/Psych  Headaches, Anxiety Chronic cervical radiculopathy Chronic lumbar radiculopathy  Neuromuscular disease    GI/Hepatic negative GI ROS,   Endo/Other  Morbid obesity  Renal/GU negative Renal ROS  negative genitourinary   Musculoskeletal  (+) Arthritis , Medial meniscus tear left knee Chronic low back pain   Abdominal (+) + obese,   Peds  Hematology  (+) anemia ,   Anesthesia Other Findings   Reproductive/Obstetrics                             Anesthesia Physical Anesthesia Plan  ASA: III  Anesthesia Plan: General   Post-op Pain Management:  Regional for Post-op pain   Induction: Intravenous  PONV Risk Score and Plan: 4 or greater and Scopolamine patch - Pre-op, Midazolam, Dexamethasone, Ondansetron and Treatment may vary due to age or medical condition  Airway Management Planned: LMA  Additional Equipment:   Intra-op Plan:   Post-operative Plan: Extubation in OR  Informed Consent: I have reviewed the patients History and Physical, chart, labs and discussed the procedure including the risks, benefits and alternatives for the proposed anesthesia with the patient or authorized representative who has indicated his/her understanding and acceptance.   Dental advisory given  Plan Discussed with: CRNA and Surgeon  Anesthesia Plan Comments:         Anesthesia Quick Evaluation

## 2018-06-26 ENCOUNTER — Encounter (HOSPITAL_COMMUNITY): Payer: Self-pay | Admitting: Orthopedic Surgery

## 2018-07-03 ENCOUNTER — Encounter (INDEPENDENT_AMBULATORY_CARE_PROVIDER_SITE_OTHER): Payer: Self-pay | Admitting: Orthopedic Surgery

## 2018-07-03 ENCOUNTER — Ambulatory Visit (INDEPENDENT_AMBULATORY_CARE_PROVIDER_SITE_OTHER): Payer: Medicaid Other | Admitting: Orthopedic Surgery

## 2018-07-03 DIAGNOSIS — S838X1D Sprain of other specified parts of right knee, subsequent encounter: Secondary | ICD-10-CM

## 2018-07-03 NOTE — Progress Notes (Signed)
   Post-Op Visit Note   Patient: ROSALI AUGELLO           Date of Birth: 1971-06-11           MRN: [id number] Visit Date: 07/03/2018 PCP: Lanelle Bal, MD   Assessment & Plan:  Chief Complaint:  Chief Complaint  Patient presents with  . Right Knee - Follow-up   Visit Diagnoses:  1. Injury of meniscus of right knee, subsequent encounter     Plan: [name] is a patient who is now a week out right knee arthroscopy attempted meniscal root repair.  Patient actually had about 20 or 30% of that attachment still intact.  Debridement was performed.  In general she is doing recently well.  She is walking around with crutches full weightbearing.  On exam she only has about 60 degrees of flexion.  I like for her to get more flexion.  Try her own PT per her request.  4-week return for clinical recheck.  I think she will be out of her work for at least 6 more weeks.  I did tell her that her rate of recovery is really proportional to her gaining range of motion and strength in the quad.  Follow-Up Instructions: Return in about 4 weeks (around 07/31/2018).   Orders:  No orders of the defined types were placed in this encounter.  No orders of the defined types were placed in this encounter.   Imaging: No results found.  PMFS History: Patient Active Problem List   Diagnosis Date Noted  . Posterior knee pain, right 02/21/2018  . Essential hypertension 01/30/2018  . Mass on back 01/30/2018  . Cervical radiculopathy 04/30/2017  . Pre-diabetes 02/16/2016  . OSA (obstructive sleep apnea) 05/14/2015  . Chronic lumbar radiculopathy 11/03/2014  . Other allergic rhinitis 09/07/2014  . Health care maintenance 09/07/2014  . OBESITY, MORBID 09/20/2006  . Anemia, iron deficiency 08/09/2006   Past Medical History:  Diagnosis Date  . Anemia   . Anxiety   . Arthritis    "left knee"  . Chronic lower back pain    "on the left side"  . Headache(784.0) 02/29/12   "frequent"  . Hypertension     . Left knee pain 11/04/2015  . Migraines   . Pneumonia 2006  . Sleep apnea    CPAP  . Stress at home    "and at work"    Family History  Problem Relation Age of Onset  . Cancer Other     Past Surgical History:  Procedure Laterality Date  . BREAST BIOPSY Left 07/29/2013  . CESAREAN SECTION  08/2008  . CHOLECYSTECTOMY  03/02/2012   Procedure: LAPAROSCOPIC CHOLECYSTECTOMY;  Surgeon: Cherylynn Ridges, MD;  Location: [facility] OR;  Service: General;;  . KNEE ARTHROSCOPY WITH MENISCAL REPAIR Right 06/25/2018   Procedure: RIGHT KNEE ARTHROSCOPY WITH MENISCAL ROOT REPAIR;  Surgeon: Cammy Copa, MD;  Location: [facility] OR;  Service: Orthopedics;  Laterality: Right;  . WISDOM TOOTH EXTRACTION     Social History   Occupational History  . Not on file  Tobacco Use  . Smoking status: Never Smoker  . Smokeless tobacco: Never Used  Substance and Sexual Activity  . Alcohol use: No    Alcohol/week: 0.0 standard drinks  . Drug use: No  . Sexual activity: Not on file

## 2018-07-25 ENCOUNTER — Encounter (INDEPENDENT_AMBULATORY_CARE_PROVIDER_SITE_OTHER): Payer: Self-pay | Admitting: Orthopedic Surgery

## 2018-08-05 ENCOUNTER — Encounter (INDEPENDENT_AMBULATORY_CARE_PROVIDER_SITE_OTHER): Payer: Self-pay | Admitting: Orthopedic Surgery

## 2018-08-05 ENCOUNTER — Ambulatory Visit (INDEPENDENT_AMBULATORY_CARE_PROVIDER_SITE_OTHER): Payer: Medicaid Other | Admitting: Orthopedic Surgery

## 2018-08-05 DIAGNOSIS — S838X1D Sprain of other specified parts of right knee, subsequent encounter: Secondary | ICD-10-CM

## 2018-08-07 ENCOUNTER — Other Ambulatory Visit: Payer: Self-pay | Admitting: *Deleted

## 2018-08-07 ENCOUNTER — Encounter (INDEPENDENT_AMBULATORY_CARE_PROVIDER_SITE_OTHER): Payer: Self-pay | Admitting: Orthopedic Surgery

## 2018-08-07 DIAGNOSIS — M5416 Radiculopathy, lumbar region: Secondary | ICD-10-CM

## 2018-08-07 MED ORDER — DULOXETINE HCL 60 MG PO CPEP: 60.0000 mg | ORAL_CAPSULE | Freq: Every day | ORAL | 0 refills | Status: DC

## 2018-08-07 NOTE — Progress Notes (Signed)
   Post-Op Visit Note   Patient: [name] [name]           Date of Birth: 08/15/1971           MRN: [id number] Visit Date: 08/05/2018 PCP: [name] [name], Lawrence, MD   Assessment & Plan:  Chief Complaint:  Chief Complaint  Patient presents with  . Right Knee - Routine Post Op   Visit Diagnoses:  1. Injury of meniscus of right knee, subsequent encounter     Plan: [name] [name] is a patient is now about 6 weeks out right knee arthroscopy and meniscal root debridement she is been doing well.  Stairs are little difficult.  She is walking around the house.  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  I think it would be okay for her to go back to work for 5-hour shifts in about 3 weeks.  She needs to work a little bit more on strengthening.  I will see her back in 6 weeks for final check.  No calf tenderness today.  Follow-Up Instructions: Return if symptoms worsen or fail to improve.   Orders:  No orders of the defined types were placed in this encounter.  No orders of the defined types were placed in this encounter.   Imaging: No results found.  PMFS History: Patient Active Problem List   Diagnosis Date Noted  . Posterior knee pain, right 02/21/2018  . Essential hypertension 01/30/2018  . Mass on back 01/30/2018  . Cervical radiculopathy 04/30/2017  . Pre-diabetes 02/16/2016  . OSA (obstructive sleep apnea) 05/14/2015  . Chronic lumbar radiculopathy 11/03/2014  . Other allergic rhinitis 09/07/2014  . Health care maintenance 09/07/2014  . OBESITY, MORBID 09/20/2006  . Anemia, iron deficiency 08/09/2006   Past Medical History:  Diagnosis Date  . Anemia   . Anxiety   . Arthritis    "left knee"  . Chronic lower back pain    "on the left side"  . Headache(784.0) 02/29/12   "frequent"  . Hypertension   . Left knee pain 11/04/2015  . Migraines   . Pneumonia 2006  . Sleep apnea    CPAP  . Stress at home    "and at work"    Family History  Problem Relation Age of Onset  .  Cancer Other     Past Surgical History:  Procedure Laterality Date  . BREAST BIOPSY Left 07/29/2013  . CESAREAN SECTION  08/2008  . CHOLECYSTECTOMY  03/02/2012   Procedure: LAPAROSCOPIC CHOLECYSTECTOMY;  Surgeon: [name] RidgesJames O Wyatt, MD;  [facility];  Service: General;;  . KNEE ARTHROSCOPY WITH MENISCAL REPAIR Right 06/25/2018   Procedure: RIGHT KNEE ARTHROSCOPY WITH MENISCAL ROOT REPAIR;  Surgeon: [name] [name],  Scott, MD;  [facility];  Service: Orthopedics;  Laterality: Right;  . WISDOM TOOTH EXTRACTION     Social History   Occupational History  . Not on file  Tobacco Use  . Smoking status: Never Smoker  . Smokeless tobacco: Never Used  Substance and Sexual Activity  . Alcohol use: No    Alcohol/week: 0.0 standard drinks  . Drug use: No  . Sexual activity: Not on file

## 2018-08-08 ENCOUNTER — Other Ambulatory Visit: Payer: Self-pay | Admitting: *Deleted

## 2018-08-08 DIAGNOSIS — M5416 Radiculopathy, lumbar region: Secondary | ICD-10-CM

## 2018-08-09 ENCOUNTER — Other Ambulatory Visit: Payer: Self-pay

## 2018-08-13 NOTE — Progress Notes (Deleted)
   CC: ***  HPI:[name] is a 47 y.o. female who presents for evaluation of ***. Please see individual problem based A/P for details.  HTN: *** Plan: ***  Right meniscus tear s/p surgical repair: *** Plan:  ***  Prediabetes: ***  Need for influenza vaccine:  Need for HIV screening:  Need for TDAP:  PHQ-9: Based on the patients    [facility]  PHQ-9 Total Score  11     score we have ***.  Past Medical History:  Diagnosis Date  . Anemia   . Anxiety   . Arthritis    "left knee"  . Chronic lower back pain    "on the left side"  . Headache(784.0) 02/29/12   "frequent"  . Hypertension   . Left knee pain 11/04/2015  . Migraines   . Pneumonia 2006  . Sleep apnea    CPAP  . Stress at home    "and at work"   Review of Systems:  ***  Physical Exam: There were no vitals filed for this visit. General: *** HEENT: *** Cardio: *** Pulmonary: *** MSK: ***  Assessment & Plan:   See Encounters Tab for problem based charting.  Patient {GC/GE:3044014::"discussed with","seen with"} Dr. {NAMES:3044014::"Butcher","Granfortuna","E. Hoffman","Klima","Mullen","Narendra","Raines","Vincent"}

## 2018-08-14 ENCOUNTER — Encounter: Payer: Self-pay | Admitting: Internal Medicine

## 2018-08-14 ENCOUNTER — Encounter: Payer: Medicaid Other | Admitting: Internal Medicine

## 2018-08-21 ENCOUNTER — Encounter: Payer: Medicaid Other | Admitting: Internal Medicine

## 2018-09-03 ENCOUNTER — Ambulatory Visit: Payer: Medicaid Other

## 2018-09-05 ENCOUNTER — Other Ambulatory Visit: Payer: Self-pay | Admitting: *Deleted

## 2018-09-05 DIAGNOSIS — M5416 Radiculopathy, lumbar region: Secondary | ICD-10-CM

## 2018-09-05 NOTE — Telephone Encounter (Signed)
Pt requesting a refill n Gabapentin 300 mg 1 in am and 3 at night. Dr Harbrecht's first available is 10/27/18 -scheduled. She agreed to come in tomorrow [facility].

## 2018-09-06 ENCOUNTER — Encounter: Payer: Self-pay | Admitting: Internal Medicine

## 2018-09-06 ENCOUNTER — Other Ambulatory Visit: Payer: Self-pay

## 2018-09-06 ENCOUNTER — Ambulatory Visit (INDEPENDENT_AMBULATORY_CARE_PROVIDER_SITE_OTHER): Payer: Medicaid Other | Admitting: Internal Medicine

## 2018-09-06 VITALS — BP 108/68 | HR 86 | Temp 98.0°F | Ht 62.0 in | Wt 266.5 lb

## 2018-09-06 DIAGNOSIS — Z79899 Other long term (current) drug therapy: Secondary | ICD-10-CM | POA: Diagnosis not present

## 2018-09-06 DIAGNOSIS — G8929 Other chronic pain: Secondary | ICD-10-CM

## 2018-09-06 DIAGNOSIS — Z23 Encounter for immunization: Secondary | ICD-10-CM

## 2018-09-06 DIAGNOSIS — M25561 Pain in right knee: Secondary | ICD-10-CM

## 2018-09-06 DIAGNOSIS — Z791 Long term (current) use of non-steroidal anti-inflammatories (NSAID): Secondary | ICD-10-CM

## 2018-09-06 DIAGNOSIS — I10 Essential (primary) hypertension: Secondary | ICD-10-CM | POA: Diagnosis not present

## 2018-09-06 DIAGNOSIS — M5416 Radiculopathy, lumbar region: Secondary | ICD-10-CM

## 2018-09-06 MED ORDER — CYCLOBENZAPRINE HCL 5 MG PO TABS: ORAL_TABLET | ORAL | 2 refills | Status: DC

## 2018-09-06 MED ORDER — GABAPENTIN 300 MG PO CAPS: ORAL_CAPSULE | ORAL | 2 refills | Status: DC

## 2018-09-06 MED ORDER — MELOXICAM 15 MG PO TABS: 15.0000 mg | ORAL_TABLET | Freq: Every day | ORAL | 2 refills | Status: DC

## 2018-09-06 MED ORDER — GABAPENTIN 300 MG PO CAPS: ORAL_CAPSULE | ORAL | 1 refills | Status: DC

## 2018-09-06 MED ORDER — LISINOPRIL 5 MG PO TABS: 5.0000 mg | ORAL_TABLET | Freq: Every day | ORAL | 3 refills | Status: DC

## 2018-09-06 NOTE — Patient Instructions (Signed)
Thank you for allowing us to provide your care. I have sent out refills on all your medications. Please continue all as prescribed. Please follow-up with her primary care doctor in February to discuss strategies to get you off the meloxicam. I hope you have some happy holidays.

## 2018-09-06 NOTE — Progress Notes (Signed)
   CC: F/U HTN, radicular pain, and knee pain   HPI:  [name] [name] is a 47 y.o. female who presented to the clinic for continued evaluation and management of her chronic medical illnesses. For a detailed assessment and plan please refer to problem based charting below.   Past Medical History:  Diagnosis Date  . Anemia   . Anxiety   . Arthritis    "left knee"  . Chronic lower back pain    "on the left side"  . Headache(784.0) 02/29/12   "frequent"  . Hypertension   . Left knee pain 11/04/2015  . Migraines   . Pneumonia 2006  . Sleep apnea    CPAP  . Stress at home    "and at work"   Review of Systems:  12 point ROS preformed. All negative aside from those mentioned in the HPI.  Physical Exam: Vitals:   09/06/18 1049  BP: 108/68  Pulse: 86  Temp: 98 F (36.7 C)  TempSrc: Oral  SpO2: 97%  Weight: 266 lb 8 oz (120.9 kg)  Height: 5\' 2"  (1.575 m)   General: Obese female in no acute distress Pulm: Good air movement with no wheezing or crackles  CV: RRR, no murmurs, no rubs  Abdomen: Active bowel sounds, soft, non-distended, no tenderness to palpation   Assessment & Plan:   See Encounters Tab for problem based charting.  Patient discussed with Dr. Cleda DaubE. Hoffman

## 2018-09-06 NOTE — Assessment & Plan Note (Signed)
Patient with right knee pain secondary to a meniscal tear. She underwent arthroscopic repair in October. She still has some soreness around the right knee but otherwise is doing well. She continues to follow with orthopedic surgery. Her pain is well controlled on meloxicam 15 mg once daily. Refill sent but we discussed that this is not a medication for chronic use as it can cause gastritis and increase her risk for G.I. bleed especially with the concurrent use of duloxetine. She will follow-up with her primary care provider in regards to change in management.

## 2018-09-06 NOTE — Progress Notes (Deleted)
   CC: ***  HPI:  [name] is a 47 y.o.   Past Medical History:  Diagnosis Date  . Anemia   . Anxiety   . Arthritis    "left knee"  . Chronic lower back pain    "on the left side"  . Headache(784.0) 02/29/12   "frequent"  . Hypertension   . Left knee pain 11/04/2015  . Migraines   . Pneumonia 2006  . Sleep apnea    CPAP  . Stress at home    "and at work"   Review of Systems:  ***  Physical Exam:  There were no vitals filed for this visit. ***  Assessment & Plan:   See Encounters Tab for problem based charting.  Patient {GC/GE:3044014::"discussed with","seen with"} Dr. {NAMES:3044014::"Butcher","Granfortuna","E. Hoffman","Klima","Mullen","Narendra","Raines","Vincent"}

## 2018-09-06 NOTE — Assessment & Plan Note (Signed)
Patient's blood pressure is currently well controlled on lisinopril 5 mg once daily. She is not experiencing any adverse effects due to the medication. She is able to afford and take the medication as prescribed. No changes to medical management today. Prescription refill sent.

## 2018-09-06 NOTE — Assessment & Plan Note (Signed)
Patient with chronic lumbar radiculopathy well controlled on gabapentin 900 mg at night and 300 mg in the morning, cyclobenzaprine 5 mg BID PRN, and duloxetine 60 mg once daily. She does need a refill on all of these medications except for her duloxetine. She does not have any progression of her symptoms or red flag symptoms today. Physical exam is grossly unremarkable. No changes to medical management today. Prescription refills sent for gabapentin and cyclobenzaprine.

## 2018-09-09 NOTE — Progress Notes (Signed)
Internal Medicine Clinic Attending  Case discussed with Dr. Helberg at the time of the visit.  We reviewed the resident's history and exam and pertinent patient test results.  I agree with the assessment, diagnosis, and plan of care documented in the resident's note.    

## 2018-09-11 ENCOUNTER — Ambulatory Visit (INDEPENDENT_AMBULATORY_CARE_PROVIDER_SITE_OTHER): Payer: Medicaid Other | Admitting: Orthopedic Surgery

## 2018-09-13 ENCOUNTER — Ambulatory Visit (INDEPENDENT_AMBULATORY_CARE_PROVIDER_SITE_OTHER): Payer: Medicaid Other | Admitting: Orthopedic Surgery

## 2018-09-13 ENCOUNTER — Encounter (INDEPENDENT_AMBULATORY_CARE_PROVIDER_SITE_OTHER): Payer: Self-pay | Admitting: Orthopedic Surgery

## 2018-09-13 DIAGNOSIS — S838X1D Sprain of other specified parts of right knee, subsequent encounter: Secondary | ICD-10-CM

## 2018-09-15 ENCOUNTER — Encounter (INDEPENDENT_AMBULATORY_CARE_PROVIDER_SITE_OTHER): Payer: Self-pay | Admitting: Orthopedic Surgery

## 2018-09-15 NOTE — Progress Notes (Signed)
   Post-Op Visit Note   Patient: [name] [name]           Date of Birth: 1971-03-20           MRN: [id number] Visit Date: 09/13/2018 PCP: Lanelle BalHarbrecht, Lawrence, MD   Assessment & Plan:  Chief Complaint:  Chief Complaint  Patient presents with  . Right Knee - Pain, Follow-up   Visit Diagnoses:  1. Injury of meniscus of right knee, subsequent encounter     Plan: [name] [name] is a 47 year old patient who underwent right knee medial meniscal root debridement 06/25/2018.  Reports some soreness and ambulates with a cane.  Hemoglobin for her symptoms.  On exam there is no effusion in her quad strength is improving.  Range of motion is symmetric to the left knee.  Plan at this time is for her to be released.  I do not really want her doing anything such as stair climbing for exercise.  Non-loadbearing exercises would be best for [name] [name].  I will see her back as needed  Follow-Up Instructions: Return if symptoms worsen or fail to improve.   Orders:  No orders of the defined types were placed in this encounter.  No orders of the defined types were placed in this encounter.   Imaging: No results found.  PMFS History: Patient Active Problem List   Diagnosis Date Noted  . Posterior knee pain, right 02/21/2018  . Essential hypertension 01/30/2018  . Mass on back 01/30/2018  . Cervical radiculopathy 04/30/2017  . Pre-diabetes 02/16/2016  . OSA (obstructive sleep apnea) 05/14/2015  . Chronic lumbar radiculopathy 11/03/2014  . Other allergic rhinitis 09/07/2014  . Health care maintenance 09/07/2014  . OBESITY, MORBID 09/20/2006  . Anemia, iron deficiency 08/09/2006   Past Medical History:  Diagnosis Date  . Anemia   . Anxiety   . Arthritis    "left knee"  . Chronic lower back pain    "on the left side"  . Headache(784.0) 02/29/12   "frequent"  . Hypertension   . Left knee pain 11/04/2015  . Migraines   . Pneumonia 2006  . Sleep apnea    CPAP  . Stress at home    "and at work"    Family History  Problem Relation Age of Onset  . Cancer Other     Past Surgical History:  Procedure Laterality Date  . BREAST BIOPSY Left 07/29/2013  . CESAREAN SECTION  08/2008  . CHOLECYSTECTOMY  03/02/2012   Procedure: LAPAROSCOPIC CHOLECYSTECTOMY;  Surgeon: Cherylynn RidgesJames O Wyatt, MD;  [facility];  Service: General;;  . KNEE ARTHROSCOPY WITH MENISCAL REPAIR Right 06/25/2018   Procedure: RIGHT KNEE ARTHROSCOPY WITH MENISCAL ROOT REPAIR;  Surgeon: Cammy Copaean, Dawnette Mione Scott, MD;  [facility];  Service: Orthopedics;  Laterality: Right;  . WISDOM TOOTH EXTRACTION     Social History   Occupational History  . Not on file  Tobacco Use  . Smoking status: Never Smoker  . Smokeless tobacco: Never Used  Substance and Sexual Activity  . Alcohol use: No    Alcohol/week: 0.0 standard drinks  . Drug use: No  . Sexual activity: Not on file

## 2018-09-16 ENCOUNTER — Ambulatory Visit (INDEPENDENT_AMBULATORY_CARE_PROVIDER_SITE_OTHER): Payer: Medicaid Other | Admitting: Orthopedic Surgery

## 2018-09-23 ENCOUNTER — Ambulatory Visit (INDEPENDENT_AMBULATORY_CARE_PROVIDER_SITE_OTHER): Payer: Medicaid Other | Admitting: Orthopedic Surgery

## 2018-10-09 ENCOUNTER — Telehealth (INDEPENDENT_AMBULATORY_CARE_PROVIDER_SITE_OTHER): Payer: Self-pay | Admitting: Orthopedic Surgery

## 2018-10-09 ENCOUNTER — Encounter: Payer: Self-pay | Admitting: Internal Medicine

## 2018-10-09 NOTE — Telephone Encounter (Signed)
Ok

## 2018-10-09 NOTE — Telephone Encounter (Signed)
Patient called she needs a note stating she is cleared to return to work

## 2018-10-10 NOTE — Telephone Encounter (Signed)
Ok for that

## 2018-10-11 NOTE — Telephone Encounter (Signed)
I called patient. She would like note to be mailed to home address. Address verified. Note mailed.

## 2018-10-29 NOTE — Progress Notes (Signed)
   CC: high blood pressure and chronic knee pain  HPI:[name] is a 48 y.o. female who presents for evaluation of HTN and right knee pain. Please see individual problem based A/P for details.  PHQ-9: Based on the patients    Office Visit from 10/30/2018 in [facility]  PHQ-9 Total Score  4     score we have decided to continue to monitor.  Past Medical History:  Diagnosis Date  . Anemia   . Anxiety   . Arthritis    "left knee"  . Chronic lower back pain    "on the left side"  . Headache(784.0) 02/29/12   "frequent"  . Hypertension   . Left knee pain 11/04/2015  . Migraines   . Pneumonia 2006  . Sleep apnea    CPAP  . Stress at home    "and at work"   Review of Systems:  ROS negative except as per HPI.  Physical Exam: Vitals:   10/30/18 1423  BP: (!) 141/93  Pulse: 89  Temp: 98.7 F (37.1 C)  TempSrc: Oral  SpO2: 98%  Weight: 267 lb 14.4 oz (121.5 kg)   General: A/O x4, in no acute distress, afebrile, nondiaphoretic Cardio: RRR, no mrg's Pulmonary: CTA bilaterally MSK: BLE nontender, nonedematous Neuro: Alert, conversational, bilaterally, normal gait Psych: Appropriate affect, not depressed in appearance, engages well  Assessment & Plan:   See Encounters Tab for problem based charting.  Patient discussed with Dr. Rogelia Boga

## 2018-10-30 ENCOUNTER — Encounter: Payer: Self-pay | Admitting: Internal Medicine

## 2018-10-30 ENCOUNTER — Other Ambulatory Visit: Payer: Self-pay

## 2018-10-30 ENCOUNTER — Ambulatory Visit: Payer: Medicaid Other | Admitting: Internal Medicine

## 2018-10-30 VITALS — BP 141/93 | HR 89 | Temp 98.7°F | Wt 267.9 lb

## 2018-10-30 DIAGNOSIS — Z6841 Body Mass Index (BMI) 40.0 and over, adult: Secondary | ICD-10-CM

## 2018-10-30 DIAGNOSIS — E875 Hyperkalemia: Secondary | ICD-10-CM

## 2018-10-30 DIAGNOSIS — M5416 Radiculopathy, lumbar region: Secondary | ICD-10-CM | POA: Diagnosis not present

## 2018-10-30 DIAGNOSIS — M25561 Pain in right knee: Secondary | ICD-10-CM

## 2018-10-30 DIAGNOSIS — I10 Essential (primary) hypertension: Secondary | ICD-10-CM

## 2018-10-30 DIAGNOSIS — R7303 Prediabetes: Secondary | ICD-10-CM

## 2018-10-30 HISTORY — DX: Hyperkalemia: E87.5

## 2018-10-30 LAB — GLUCOSE, CAPILLARY: Glucose-Capillary: 94 mg/dL (ref 70–99)

## 2018-10-30 LAB — POCT GLYCOSYLATED HEMOGLOBIN (HGB A1C): Hemoglobin A1C: 5.8 % — AB (ref 4.0–5.6)

## 2018-10-30 MED ORDER — LISINOPRIL 10 MG PO TABS: 10.0000 mg | ORAL_TABLET | Freq: Every day | ORAL | 3 refills | Status: DC

## 2018-10-30 NOTE — Patient Instructions (Signed)
FOLLOW-UP INSTRUCTIONS When: 2-4 months For: Routine visit What to bring: All of your medications  Today we discussed your high blood pressure, back pain, and knee pain. Given the insurance issues with physical therapy I can not recommend more highly the potential benefit you may feel from water aerobics. I feel that this would benefit both your back and knee pain as well as decrease your blood pressure. In addition this should be better tolerated than other typical forms of exercise and could help with weight loss, a component that contributes to you back/knee pain and high blood pressure.   While you continue to work on the exercising and lifestyle modifications of your diet, please take the increased lisinopril dosage of 10mg  daily for your blood pressure.   Thank you for your visit to the [facility] today. If you have any questions or concerns please call us at [phone].

## 2018-10-30 NOTE — Assessment & Plan Note (Signed)
BMP ordered to evaluate potassium as it was 5.6 on her most recent

## 2018-10-30 NOTE — Assessment & Plan Note (Signed)
Metabolic syndrome, Prediabetes: Glucose intolerance, Last A1c 6.0%, treated HTN, obesity.  Plan:  A1c today 5.8%. Will obtain a Lipid panel as well to better assess need for primary treatment of HLD

## 2018-10-30 NOTE — Assessment & Plan Note (Signed)
Chronic lumbar radiculopathy: L4-L5 disc bulge seen on MRI August 2019.  I feel this is likely the source of her radicular pain.  She was seen by orthopedic surgery who did not comment as to whether she is a surgical candidate.  However, she is not particularly interested in surgery and I do not feel once currently indicated.  In addition, given her Medicaid insurance, physical therapy not and affordable option.  I have highly recommended water aerobics and weight loss.  We spent a considerable amount of time discussing both.  I feel she is good candidate to consider discussing dietary modifications with a nutritionist at her next visit if she is amenable.

## 2018-10-30 NOTE — Assessment & Plan Note (Signed)
Hypertension: Patient's BP today is 141/93 with a goal of <140/80. The patient endorses adherence to their medications regimen. They denied, chest pain, headache, visual changes, lightheadedness, weakness, dizziness on standing, swelling in the feet or ankles.   Plan: Continue lisinopril but increase to 10 mg daily BMP today given her history of hyperkalemia

## 2018-10-30 NOTE — Assessment & Plan Note (Signed)
Posterior knee pain, right: Patient states that her pain markedly improved since her procedure.  She underwent arthroscopic repair in October with orthopedic surgery for a right medial meniscal tear. Pain persists despite surgery when kneeling on hard surface.  This is relieved with a pillow of the soft barrier.  She continues to use meloxicam only once to twice per week, the Flexeril similarly, but both most often for chronic lumbar radiculopathy  Plan:  Meloxicam PRN 1-2 times per week

## 2018-10-31 LAB — BMP8+ANION GAP
ANION GAP: 15 mmol/L (ref 10.0–18.0)
BUN / CREAT RATIO: 9 (ref 9–23)
BUN: 7 mg/dL (ref 6–24)
CO2: 22 mmol/L (ref 20–29)
CREATININE: 0.76 mg/dL (ref 0.57–1.00)
Calcium: 9.5 mg/dL (ref 8.7–10.2)
Chloride: 105 mmol/L (ref 96–106)
GFR calc Af Amer: 109 mL/min/{1.73_m2} (ref >59)
GFR, EST NON AFRICAN AMERICAN: 94 mL/min/{1.73_m2} (ref >59)
Glucose: 85 mg/dL (ref 65–99)
Potassium: 3.9 mmol/L (ref 3.5–5.2)
SODIUM: 142 mmol/L (ref 134–144)

## 2018-10-31 LAB — LIPID PANEL
Chol/HDL Ratio: 2.9 ratio (ref 0.0–4.4)
Cholesterol, Total: 162 mg/dL (ref 100–199)
HDL: 55 mg/dL (ref >39)
LDL CALC: 94 mg/dL (ref 0–99)
Triglycerides: 64 mg/dL (ref 0–149)
VLDL CHOLESTEROL CAL: 13 mg/dL (ref 5–40)

## 2018-11-01 NOTE — Progress Notes (Signed)
Internal Medicine Clinic Attending  Case discussed with Dr. Harbrecht at the time of the visit.  We reviewed the resident's history and exam and pertinent patient test results.  I agree with the assessment, diagnosis, and plan of care documented in the resident's note.   

## 2018-11-13 ENCOUNTER — Emergency Department (HOSPITAL_COMMUNITY): Payer: Medicaid Other

## 2018-11-13 ENCOUNTER — Encounter (HOSPITAL_COMMUNITY): Payer: Self-pay | Admitting: *Deleted

## 2018-11-13 ENCOUNTER — Emergency Department (HOSPITAL_COMMUNITY)
Admission: EM | Admit: 2018-11-13 | Discharge: 2018-11-13 | Disposition: A | Discharge status: Home / Self Care | Payer: Medicaid Other | Attending: Emergency Medicine | Admitting: Emergency Medicine

## 2018-11-13 DIAGNOSIS — Z7982 Long term (current) use of aspirin: Secondary | ICD-10-CM | POA: Diagnosis not present

## 2018-11-13 DIAGNOSIS — R05 Cough: Secondary | ICD-10-CM | POA: Diagnosis present

## 2018-11-13 DIAGNOSIS — B349 Viral infection, unspecified: Secondary | ICD-10-CM

## 2018-11-13 DIAGNOSIS — I10 Essential (primary) hypertension: Secondary | ICD-10-CM | POA: Insufficient documentation

## 2018-11-13 DIAGNOSIS — Z79899 Other long term (current) drug therapy: Secondary | ICD-10-CM | POA: Diagnosis not present

## 2018-11-13 MED ORDER — BENZONATATE 100 MG PO CAPS: 100.0000 mg | ORAL_CAPSULE | Freq: Three times a day (TID) | ORAL | 0 refills | Status: DC

## 2018-11-13 MED ORDER — FLUTICASONE PROPIONATE 50 MCG/ACT NA SUSP: 2.0000 | Freq: Every day | NASAL | 0 refills | Status: DC

## 2018-11-13 MED ORDER — ONDANSETRON 4 MG PO TBDP: 4.0000 mg | ORAL_TABLET | Freq: Three times a day (TID) | ORAL | 0 refills | Status: DC | PRN

## 2018-11-13 NOTE — ED Notes (Signed)
Patient given discharge instructions and verbalized understanding.  Patient stable to discharge at this time.  Patient is alert and oriented to baseline.  No distressed noted at this time.  All belongings taken with the patient at discharge.   

## 2018-11-13 NOTE — ED Notes (Signed)
Called pt for room no answer 

## 2018-11-13 NOTE — ED Triage Notes (Signed)
Pt in c/o cough, congestion, and last night had episode of vomiting and diarrhea, states she felt this way the last time she had pneumonia

## 2018-11-13 NOTE — Discharge Instructions (Signed)
Your symptoms are likely consistent with a viral illness. Viruses do not require or respond to antibiotics. Treatment is symptomatic care and it is important to note that these symptoms may last for 7-14 days.   Hand washing: Wash your hands throughout the day, but especially before and after touching the face, using the restroom, sneezing, coughing, or touching surfaces that have been coughed or sneezed upon. Hydration: Symptoms of most illnesses will be intensified and complicated by dehydration. Dehydration can also extend the duration of symptoms. Drink plenty of fluids and get plenty of rest. You should be drinking at least half a liter of water an hour to stay hydrated. Electrolyte drinks (ex. Gatorade, Powerade, Pedialyte) are also encouraged. You should be drinking enough fluids to make your urine light yellow, almost clear. If this is not the case, you are not drinking enough water. Please note that some of the treatments indicated below will not be effective if you are not adequately hydrated. Diet: Please concentrate on hydration, however, you may introduce food slowly.  Start with a clear liquid diet, progressed to a full liquid diet, and then bland solids as you are able. Pain or fever: Ibuprofen, Naproxen, or acetaminophen (generic for Tylenol) for pain or fever.  Acetaminophen: May take acetaminophen (generic for Tylenol), as needed, for pain. Your daily total maximum amount of acetaminophen from all sources should be limited to 4000mg /day for persons without liver problems, or 2000mg /day for those with liver problems. Do not take medications like ibuprofen or naproxen with your meloxicam. Nausea/vomiting: Use the ondansetron (generic for Zofran) for nausea or vomiting.  This medication may not prevent all vomiting or nausea, but can help facilitate better hydration. Things that can help with nausea/vomiting also include peppermint/menthol candies, vitamin B12, and ginger. Diarrhea: May  use medications such as loperamide (Imodium) or Bismuth subsalicylate (Pepto-Bismol). Cough: Use the benzonatate (generic for Tessalon) for cough.  Teas, warm liquids, broths, and honey can also help with cough. Zyrtec or Claritin: May add these medication daily to control underlying symptoms of congestion, sneezing, and other signs of allergies.  These medications are available over-the-counter. Generics: Cetirizine (generic for Zyrtec) and loratadine (generic for Claritin). Fluticasone: Use fluticasone (generic for Flonase), as directed, for nasal and sinus congestion.  This medication is available over-the-counter. Congestion: Plain guaifenesin (generic for plain Mucinex) may help relieve congestion. Saline sinus rinses and saline nasal sprays may also help relieve congestion. If you do not have high blood pressure, heart problems, or an allergy to such medications, you may also try phenylephrine or Sudafed. Sore throat: Warm liquids or Chloraseptic spray may help soothe a sore throat. Gargle twice a day with a salt water solution made from a half teaspoon of salt in a cup of warm water.  Follow up: Follow up with a primary care provider within the next two weeks should symptoms fail to resolve. Return: Return to the ED for significantly worsening symptoms, shortness of breath, persistent vomiting, large amounts of blood in stool, or any other major concerns.  For prescription assistance, may try using prescription discount sites or apps, such as goodrx.com

## 2018-11-13 NOTE — ED Provider Notes (Signed)
[facility] EMERGENCY DEPARTMENT Provider Note   CSN: [id number] Arrival date & time: 11/13/18  0749    History   Chief Complaint Chief Complaint  Patient presents with  . Cough    HPI [name] [name] is a 48 y.o. female.     HPI   [name] [name] is a 48 y.o. female, with a history of HTN, presenting to the ED with cough for 2-3 days.  Accompanied by body aches, subjective fever, and chills.  Also notes intermittent nausea, vomiting, and diarrhea. She is concerned she may have pneumonia. Denies chest pain, shortness of breath, abdominal pain, hematochezia/melena, hematemesis, rash, or any other complaints.   Past Medical History:  Diagnosis Date  . Anemia   . Anxiety   . Arthritis    "left knee"  . Chronic lower back pain    "on the left side"  . Headache(784.0) 02/29/12   "frequent"  . Hypertension   . Left knee pain 11/04/2015  . Migraines   . Pneumonia 2006  . Sleep apnea    CPAP  . Stress at home    "and at work"    Patient Active Problem List   Diagnosis Date Noted  . Hyperkalemia 10/30/2018  . Posterior knee pain, right 02/21/2018  . Essential hypertension 01/30/2018  . Mass on back 01/30/2018  . Cervical radiculopathy 04/30/2017  . Pre-diabetes 02/16/2016  . OSA (obstructive sleep apnea) 05/14/2015  . Chronic lumbar radiculopathy 11/03/2014  . Other allergic rhinitis 09/07/2014  . Health care maintenance 09/07/2014  . OBESITY, MORBID 09/20/2006  . Anemia, iron deficiency 08/09/2006    Past Surgical History:  Procedure Laterality Date  . BREAST BIOPSY Left 07/29/2013  . CESAREAN SECTION  08/2008  . CHOLECYSTECTOMY  03/02/2012   Procedure: LAPAROSCOPIC CHOLECYSTECTOMY;  Surgeon: Cherylynn Ridges, MD;  Location: [facility] OR;  Service: General;;  . KNEE ARTHROSCOPY WITH MENISCAL REPAIR Right 06/25/2018   Procedure: RIGHT KNEE ARTHROSCOPY WITH MENISCAL ROOT REPAIR;  Surgeon: Cammy Copa, MD;  Location: [facility] OR;  Service: Orthopedics;   Laterality: Right;  . WISDOM TOOTH EXTRACTION       OB History   No obstetric history on file.      Home Medications    Prior to Admission medications   Medication Sig Start Date End Date Taking? Authorizing Provider  aspirin (ASPIRIN CHILDRENS) 81 MG chewable tablet Chew 1 tablet (81 mg total) by mouth 2 (two) times daily. 06/25/18 06/25/19  Cammy Copa, MD  benzonatate (TESSALON) 100 MG capsule Take 1 capsule (100 mg total) by mouth every 8 (eight) hours. 11/13/18   Kaeleen Odom C, PA-C  cyclobenzaprine (FLEXERIL) 5 MG tablet TAKE 1 TABLET BY MOUTH ONCE DAILY AS NEEDED FOR MUSCLE SPASM 09/06/18   Levora Dredge, MD  DULoxetine (CYMBALTA) 60 MG capsule Take 1 capsule (60 mg total) by mouth daily. TAKE ONE CAPSULE  BY MOUTH AS DIRECTED 08/07/18   Lanelle Bal, MD  fluticasone [facility]) 50 MCG/ACT nasal spray Place 2 sprays into both nostrils daily. 11/13/18   Malorie Bigford C, PA-C  gabapentin (NEURONTIN) 300 MG capsule Take 300 mg in the morning and 900 mg at night. 09/06/18   Levora Dredge, MD  lisinopril (PRINIVIL,ZESTRIL) 10 MG tablet Take 1 tablet (10 mg total) by mouth daily. 10/30/18 10/30/19  Lanelle Bal, MD  meloxicam (MOBIC) 15 MG tablet Take 1 tablet (15 mg total) by mouth daily. 09/06/18   Levora Dredge, MD  ondansetron (ZOFRAN ODT) 4 MG disintegrating  tablet Take 1 tablet (4 mg total) by mouth every 8 (eight) hours as needed for nausea or vomiting. 11/13/18   Michaeljames Milnes, Hillard DankerShawn C, PA-C    Family History Family History  Problem Relation Age of Onset  . Cancer Other     Social History Social History   Tobacco Use  . Smoking status: Never Smoker  . Smokeless tobacco: Never Used  Substance Use Topics  . Alcohol use: No    Alcohol/week: 0.0 standard drinks  . Drug use: No     Allergies   Mushroom extract complex   Review of Systems Review of Systems  Constitutional: Positive for chills and fever (subjective).  HENT: Positive for congestion and  rhinorrhea. Negative for trouble swallowing.   Respiratory: Positive for cough. Negative for shortness of breath.   Cardiovascular: Negative for chest pain.  Gastrointestinal: Positive for diarrhea, nausea and vomiting. Negative for abdominal pain and blood in stool.  Genitourinary: Negative for dysuria.  Musculoskeletal: Negative for back pain, neck pain and neck stiffness.  Skin: Negative for rash.  Neurological: Negative for dizziness, syncope, light-headedness, numbness and headaches.  All other systems reviewed and are negative.    Physical Exam Updated Vital Signs BP (!) 133/92 (BP Location: Right Arm)   Pulse (!) 102   Temp 98.2 F (36.8 C)   Resp 18   LMP  (Approximate) Comment: last period in 2009.   SpO2 98%   Physical Exam Vitals signs and nursing note reviewed.  Constitutional:      General: She is not in acute distress.    Appearance: She is well-developed. She is not diaphoretic.  HENT:     Head: Normocephalic and atraumatic.     Right Ear: Tympanic membrane, ear canal and external ear normal.     Left Ear: Tympanic membrane, ear canal and external ear normal.     Nose: Nose normal.     Mouth/Throat:     Mouth: Mucous membranes are moist.     Pharynx: Oropharynx is clear.  Eyes:     Conjunctiva/sclera: Conjunctivae normal.  Neck:     Musculoskeletal: Neck supple.  Cardiovascular:     Rate and Rhythm: Normal rate and regular rhythm.     Pulses: Normal pulses.     Heart sounds: Normal heart sounds.     Comments: No tachycardia on my exam. Pulse rate of 90. Pulmonary:     Effort: Pulmonary effort is normal. No respiratory distress.     Breath sounds: Normal breath sounds.  Abdominal:     Palpations: Abdomen is soft.     Tenderness: There is no abdominal tenderness. There is no guarding.  Musculoskeletal:     Right lower leg: No edema.     Left lower leg: No edema.  Lymphadenopathy:     Cervical: No cervical adenopathy.  Skin:    General: Skin is warm  and dry.  Neurological:     Mental Status: She is alert.  Psychiatric:        Mood and Affect: Mood and affect normal.        Speech: Speech normal.        Behavior: Behavior normal.      ED Treatments / Results  Labs (all labs ordered are listed, but only abnormal results are displayed) Labs Reviewed - No data to display  EKG None  Radiology Dg Chest 2 View  Result Date: 11/13/2018 CLINICAL DATA:  Cough EXAM: CHEST - 2 VIEW COMPARISON:  01/27/2014 FINDINGS: The heart  size and mediastinal contours are within normal limits. Both lungs are clear. Disc degenerative disease of the thoracic spine. IMPRESSION: No acute abnormality of the lungs.  No focal airspace opacity. Electronically Signed   By: Lauralyn Primes M.D.   On: 11/13/2018 08:35    Procedures Procedures (including critical care time)  Medications Ordered in ED Medications - No data to display   Initial Impression / Assessment and Plan / ED Course  I have reviewed the triage vital signs and the nursing notes.  Pertinent labs & imaging results that were available during my care of the patient were reviewed by me and considered in my medical decision making (see chart for details).        Patient presents with symptoms suggestive of viral syndrome.  She is out of the window for Tamiflu. Patient is nontoxic appearing, afebrile, not tachycardic on my exam, not tachypneic, not hypotensive, maintains excellent SPO2 on room air, and is in no apparent distress.  Chest x-ray without acute abnormality.  The patient was given instructions for home care as well as return precautions. Patient voices understanding of these instructions, accepts the plan, and is comfortable with discharge.  Final Clinical Impressions(s) / ED Diagnoses   Final diagnoses:  Viral syndrome    ED Discharge Orders         Ordered    benzonatate (TESSALON) 100 MG capsule  Every 8 hours     11/13/18 1119    ondansetron (ZOFRAN ODT) 4 MG  disintegrating tablet  Every 8 hours PRN     11/13/18 1119    fluticasone (FLONASE) 50 MCG/ACT nasal spray  Daily     11/13/18 1119           Anselm Pancoast, PA-C 11/13/18 1314    Tegeler, Canary Brim, MD 11/13/18 1728

## 2018-12-28 ENCOUNTER — Other Ambulatory Visit: Payer: Self-pay | Admitting: Internal Medicine

## 2018-12-28 DIAGNOSIS — M5416 Radiculopathy, lumbar region: Secondary | ICD-10-CM

## 2018-12-30 NOTE — Telephone Encounter (Signed)
Next appt scheduled 5/27 with PCP. 

## 2019-02-19 ENCOUNTER — Encounter: Payer: Medicaid Other | Admitting: Internal Medicine

## 2019-02-25 ENCOUNTER — Other Ambulatory Visit: Payer: Self-pay | Admitting: Internal Medicine

## 2019-02-25 DIAGNOSIS — M5416 Radiculopathy, lumbar region: Secondary | ICD-10-CM

## 2019-02-27 DIAGNOSIS — R7301 Impaired fasting glucose: Secondary | ICD-10-CM | POA: Diagnosis not present

## 2019-02-27 DIAGNOSIS — Z1151 Encounter for screening for human papillomavirus (HPV): Secondary | ICD-10-CM | POA: Diagnosis not present

## 2019-02-27 DIAGNOSIS — R7982 Elevated C-reactive protein (CRP): Secondary | ICD-10-CM | POA: Diagnosis not present

## 2019-03-19 ENCOUNTER — Encounter: Payer: Medicaid Other | Admitting: Internal Medicine

## 2019-05-31 ENCOUNTER — Other Ambulatory Visit: Payer: Self-pay | Admitting: Internal Medicine

## 2019-05-31 DIAGNOSIS — M5416 Radiculopathy, lumbar region: Secondary | ICD-10-CM

## 2019-06-18 ENCOUNTER — Emergency Department (HOSPITAL_COMMUNITY): Payer: Medicaid Other

## 2019-06-18 ENCOUNTER — Encounter (HOSPITAL_COMMUNITY): Payer: Self-pay | Admitting: Emergency Medicine

## 2019-06-18 ENCOUNTER — Other Ambulatory Visit: Payer: Self-pay

## 2019-06-18 ENCOUNTER — Inpatient Hospital Stay (HOSPITAL_COMMUNITY)
Admission: EM | Admit: 2019-06-18 | Discharge: 2019-06-23 | DRG: 177 | Disposition: A | Discharge status: Home / Self Care | Payer: Medicaid Other | Attending: Internal Medicine | Admitting: Internal Medicine

## 2019-06-18 DIAGNOSIS — E860 Dehydration: Secondary | ICD-10-CM | POA: Diagnosis not present

## 2019-06-18 DIAGNOSIS — Z791 Long term (current) use of non-steroidal anti-inflammatories (NSAID): Secondary | ICD-10-CM

## 2019-06-18 DIAGNOSIS — J1289 Other viral pneumonia: Secondary | ICD-10-CM | POA: Diagnosis not present

## 2019-06-18 DIAGNOSIS — Z6841 Body Mass Index (BMI) 40.0 and over, adult: Secondary | ICD-10-CM | POA: Diagnosis not present

## 2019-06-18 DIAGNOSIS — Z8701 Personal history of pneumonia (recurrent): Secondary | ICD-10-CM

## 2019-06-18 DIAGNOSIS — Z79899 Other long term (current) drug therapy: Secondary | ICD-10-CM | POA: Diagnosis not present

## 2019-06-18 DIAGNOSIS — J1282 Pneumonia due to coronavirus disease 2019: Secondary | ICD-10-CM | POA: Diagnosis present

## 2019-06-18 DIAGNOSIS — R0602 Shortness of breath: Secondary | ICD-10-CM | POA: Diagnosis not present

## 2019-06-18 DIAGNOSIS — Z23 Encounter for immunization: Secondary | ICD-10-CM

## 2019-06-18 DIAGNOSIS — M1712 Unilateral primary osteoarthritis, left knee: Secondary | ICD-10-CM | POA: Diagnosis not present

## 2019-06-18 DIAGNOSIS — G4733 Obstructive sleep apnea (adult) (pediatric): Secondary | ICD-10-CM | POA: Diagnosis not present

## 2019-06-18 DIAGNOSIS — Z7951 Long term (current) use of inhaled steroids: Secondary | ICD-10-CM

## 2019-06-18 DIAGNOSIS — U071 COVID-19: Secondary | ICD-10-CM | POA: Diagnosis not present

## 2019-06-18 DIAGNOSIS — N179 Acute kidney failure, unspecified: Secondary | ICD-10-CM | POA: Diagnosis not present

## 2019-06-18 DIAGNOSIS — M5412 Radiculopathy, cervical region: Secondary | ICD-10-CM | POA: Diagnosis present

## 2019-06-18 DIAGNOSIS — Z7982 Long term (current) use of aspirin: Secondary | ICD-10-CM

## 2019-06-18 DIAGNOSIS — M5416 Radiculopathy, lumbar region: Secondary | ICD-10-CM | POA: Diagnosis present

## 2019-06-18 DIAGNOSIS — G8929 Other chronic pain: Secondary | ICD-10-CM | POA: Diagnosis not present

## 2019-06-18 DIAGNOSIS — N289 Disorder of kidney and ureter, unspecified: Secondary | ICD-10-CM

## 2019-06-18 DIAGNOSIS — J9601 Acute respiratory failure with hypoxia: Secondary | ICD-10-CM | POA: Diagnosis present

## 2019-06-18 DIAGNOSIS — I10 Essential (primary) hypertension: Secondary | ICD-10-CM | POA: Diagnosis present

## 2019-06-18 DIAGNOSIS — Z8679 Personal history of other diseases of the circulatory system: Secondary | ICD-10-CM | POA: Diagnosis present

## 2019-06-18 DIAGNOSIS — R000 Tachycardia, unspecified: Secondary | ICD-10-CM | POA: Diagnosis not present

## 2019-06-18 DIAGNOSIS — J189 Pneumonia, unspecified organism: Secondary | ICD-10-CM | POA: Diagnosis not present

## 2019-06-18 DIAGNOSIS — R0902 Hypoxemia: Secondary | ICD-10-CM | POA: Diagnosis not present

## 2019-06-18 DIAGNOSIS — R197 Diarrhea, unspecified: Secondary | ICD-10-CM | POA: Diagnosis not present

## 2019-06-18 HISTORY — DX: Disorder of kidney and ureter, unspecified: N28.9

## 2019-06-18 HISTORY — DX: Acute respiratory failure with hypoxia: J96.01

## 2019-06-18 LAB — CBC WITH DIFFERENTIAL/PLATELET
Abs Immature Granulocytes: 0 10*3/uL (ref 0.00–0.07)
Basophils Absolute: 0.1 10*3/uL (ref 0.0–0.1)
Basophils Relative: 1 %
Eosinophils Absolute: 0 10*3/uL (ref 0.0–0.5)
Eosinophils Relative: 0 %
HCT: 43.4 % (ref 36.0–46.0)
Hemoglobin: 13.2 g/dL (ref 12.0–15.0)
Immature Granulocytes: 0 %
Lymphocytes Relative: 14 %
Lymphs Abs: 0.7 10*3/uL (ref 0.7–4.0)
MCH: 25.6 pg — ABNORMAL LOW (ref 26.0–34.0)
MCHC: 30.4 g/dL (ref 30.0–36.0)
MCV: 84.3 fL (ref 80.0–100.0)
Monocytes Absolute: 0.1 10*3/uL (ref 0.1–1.0)
Monocytes Relative: 2 %
Neutro Abs: 4.4 10*3/uL (ref 1.7–7.7)
Neutrophils Relative %: 83 %
Platelets: 261 10*3/uL (ref 150–400)
RBC: 5.15 MIL/uL — ABNORMAL HIGH (ref 3.87–5.11)
RDW: 15.9 % — ABNORMAL HIGH (ref 11.5–15.5)
WBC: 5.3 10*3/uL (ref 4.0–10.5)
nRBC: 0 % (ref 0.0–0.2)
nRBC: 0 /100 WBC

## 2019-06-18 LAB — COMPREHENSIVE METABOLIC PANEL
ALT: 70 U/L — ABNORMAL HIGH (ref 0–44)
AST: 141 U/L — ABNORMAL HIGH (ref 15–41)
Albumin: 3.3 g/dL — ABNORMAL LOW (ref 3.5–5.0)
Alkaline Phosphatase: 112 U/L (ref 38–126)
Anion gap: 10 (ref 5–15)
BUN: 7 mg/dL (ref 6–20)
CO2: 24 mmol/L (ref 22–32)
Calcium: 8.5 mg/dL — ABNORMAL LOW (ref 8.9–10.3)
Chloride: 104 mmol/L (ref 98–111)
Creatinine, Ser: 1.02 mg/dL — ABNORMAL HIGH (ref 0.44–1.00)
GFR calc Af Amer: >60 mL/min (ref >60)
GFR calc non Af Amer: >60 mL/min (ref >60)
Glucose, Bld: 124 mg/dL — ABNORMAL HIGH (ref 70–99)
Potassium: 4.2 mmol/L (ref 3.5–5.1)
Sodium: 138 mmol/L (ref 135–145)
Total Bilirubin: 0.5 mg/dL (ref 0.3–1.2)
Total Protein: 7.8 g/dL (ref 6.5–8.1)

## 2019-06-18 LAB — LACTATE DEHYDROGENASE: LDH: 515 U/L — ABNORMAL HIGH (ref 98–192)

## 2019-06-18 LAB — FIBRINOGEN: Fibrinogen: 633 mg/dL — ABNORMAL HIGH (ref 210–475)

## 2019-06-18 LAB — LACTIC ACID, PLASMA: Lactic Acid, Venous: 1.3 mmol/L (ref 0.5–1.9)

## 2019-06-18 LAB — TRIGLYCERIDES: Triglycerides: 62 mg/dL (ref <150)

## 2019-06-18 LAB — D-DIMER, QUANTITATIVE: D-Dimer, Quant: 0.81 ug/mL-FEU — ABNORMAL HIGH (ref 0.00–0.50)

## 2019-06-18 LAB — I-STAT BETA HCG BLOOD, ED (MC, WL, AP ONLY): I-stat hCG, quantitative: <5 m[IU]/mL (ref <5)

## 2019-06-18 LAB — C-REACTIVE PROTEIN: CRP: 12.9 mg/dL — ABNORMAL HIGH (ref <1.0)

## 2019-06-18 LAB — PROCALCITONIN: Procalcitonin: 0.18 ng/mL

## 2019-06-18 LAB — ABO/RH: ABO/RH(D): A POS

## 2019-06-18 LAB — SARS CORONAVIRUS 2 BY RT PCR (HOSPITAL ORDER, PERFORMED IN ~~LOC~~ HOSPITAL LAB): SARS Coronavirus 2: POSITIVE — AB

## 2019-06-18 LAB — FERRITIN: Ferritin: 500 ng/mL — ABNORMAL HIGH (ref 11–307)

## 2019-06-18 LAB — PATHOLOGIST SMEAR REVIEW

## 2019-06-18 MED ORDER — SODIUM CHLORIDE 0.9 % IV SOLN
Freq: Once | INTRAVENOUS | Status: AC
  Administered 2019-06-18: 15:00:00 via INTRAVENOUS

## 2019-06-18 MED ORDER — SODIUM CHLORIDE 0.9 % IV SOLN
100.0000 mg | INTRAVENOUS | Status: AC
  Administered 2019-06-19 – 2019-06-22 (×4): 100 mg via INTRAVENOUS
  Filled 2019-06-18 (×4): qty 20

## 2019-06-18 MED ORDER — CYCLOBENZAPRINE HCL 5 MG PO TABS
5.0000 mg | ORAL_TABLET | Freq: Every day | ORAL | Status: DC | PRN
  Administered 2019-06-18: 11:00:00 5 mg via ORAL
  Filled 2019-06-18 (×2): qty 1

## 2019-06-18 MED ORDER — ACETAMINOPHEN 325 MG PO TABS: 650.0000 mg | ORAL_TABLET | Freq: Four times a day (QID) | ORAL | Status: DC | PRN

## 2019-06-18 MED ORDER — HYDROCOD POLST-CPM POLST ER 10-8 MG/5ML PO SUER: 5.0000 mL | Freq: Two times a day (BID) | ORAL | Status: DC | PRN

## 2019-06-18 MED ORDER — ONDANSETRON HCL 4 MG PO TABS: 4.0000 mg | ORAL_TABLET | Freq: Four times a day (QID) | ORAL | Status: DC | PRN

## 2019-06-18 MED ORDER — FLUTICASONE PROPIONATE 50 MCG/ACT NA SUSP
2.0000 | Freq: Every day | NASAL | Status: DC
  Administered 2019-06-18 – 2019-06-23 (×6): 2 via NASAL
  Filled 2019-06-18: qty 16

## 2019-06-18 MED ORDER — SODIUM CHLORIDE 0.9 % IV SOLN
200.0000 mg | Freq: Once | INTRAVENOUS | Status: AC
  Administered 2019-06-18: 200 mg via INTRAVENOUS
  Filled 2019-06-18: qty 40

## 2019-06-18 MED ORDER — DEXAMETHASONE 6 MG PO TABS
6.0000 mg | ORAL_TABLET | ORAL | Status: DC
  Administered 2019-06-18 – 2019-06-23 (×6): 6 mg via ORAL
  Filled 2019-06-18: qty 2
  Filled 2019-06-18 (×5): qty 1

## 2019-06-18 MED ORDER — ALBUTEROL SULFATE HFA 108 (90 BASE) MCG/ACT IN AERS
2.0000 | INHALATION_SPRAY | Freq: Four times a day (QID) | RESPIRATORY_TRACT | Status: DC
  Administered 2019-06-18 (×2): 2 via RESPIRATORY_TRACT
  Filled 2019-06-18: qty 6.7

## 2019-06-18 MED ORDER — ENOXAPARIN SODIUM 60 MG/0.6ML ~~LOC~~ SOLN
0.5000 mg/kg | SUBCUTANEOUS | Status: DC
  Administered 2019-06-18 – 2019-06-22 (×5): 60 mg via SUBCUTANEOUS
  Filled 2019-06-18 (×6): qty 0.6

## 2019-06-18 MED ORDER — VITAMIN C 500 MG PO TABS
500.0000 mg | ORAL_TABLET | Freq: Every day | ORAL | Status: DC
  Administered 2019-06-18 – 2019-06-23 (×6): 500 mg via ORAL
  Filled 2019-06-18 (×6): qty 1

## 2019-06-18 MED ORDER — ZINC SULFATE 220 (50 ZN) MG PO CAPS
220.0000 mg | ORAL_CAPSULE | Freq: Every day | ORAL | Status: DC
  Administered 2019-06-18 – 2019-06-23 (×6): 220 mg via ORAL
  Filled 2019-06-18 (×6): qty 1

## 2019-06-18 MED ORDER — GUAIFENESIN-DM 100-10 MG/5ML PO SYRP
10.0000 mL | ORAL_SOLUTION | ORAL | Status: DC | PRN
  Administered 2019-06-22: 10 mL via ORAL
  Filled 2019-06-18: qty 10

## 2019-06-18 MED ORDER — ONDANSETRON HCL 4 MG/2ML IJ SOLN: 4.0000 mg | Freq: Four times a day (QID) | INTRAMUSCULAR | Status: DC | PRN

## 2019-06-18 MED ORDER — GABAPENTIN 300 MG PO CAPS
900.0000 mg | ORAL_CAPSULE | Freq: Every day | ORAL | Status: DC
  Administered 2019-06-18 – 2019-06-22 (×4): 900 mg via ORAL
  Filled 2019-06-18 (×5): qty 3

## 2019-06-18 MED ORDER — ALBUTEROL SULFATE HFA 108 (90 BASE) MCG/ACT IN AERS: 2.0000 | INHALATION_SPRAY | Freq: Four times a day (QID) | RESPIRATORY_TRACT | Status: DC | PRN

## 2019-06-18 MED ORDER — GABAPENTIN 300 MG PO CAPS
300.0000 mg | ORAL_CAPSULE | Freq: Every morning | ORAL | Status: DC
  Administered 2019-06-18 – 2019-06-23 (×6): 300 mg via ORAL
  Filled 2019-06-18 (×6): qty 1

## 2019-06-18 MED ORDER — DULOXETINE HCL 60 MG PO CPEP
60.0000 mg | ORAL_CAPSULE | Freq: Every day | ORAL | Status: DC
  Administered 2019-06-18 – 2019-06-23 (×6): 60 mg via ORAL
  Filled 2019-06-18 (×7): qty 1

## 2019-06-18 MED ORDER — SODIUM CHLORIDE 0.9% FLUSH
3.0000 mL | Freq: Two times a day (BID) | INTRAVENOUS | Status: DC
  Administered 2019-06-18 – 2019-06-23 (×10): 3 mL via INTRAVENOUS

## 2019-06-18 MED ORDER — FAMOTIDINE IN NACL 20-0.9 MG/50ML-% IV SOLN
20.0000 mg | Freq: Two times a day (BID) | INTRAVENOUS | Status: DC
  Administered 2019-06-18 (×2): 20 mg via INTRAVENOUS
  Filled 2019-06-18 (×2): qty 50

## 2019-06-18 MED ORDER — ONDANSETRON 4 MG PO TBDP
4.0000 mg | ORAL_TABLET | Freq: Once | ORAL | Status: AC | PRN
  Administered 2019-06-18: 4 mg via ORAL
  Filled 2019-06-18: qty 1

## 2019-06-18 NOTE — ED Notes (Signed)
Pt had 1 episode of vomiting.   

## 2019-06-18 NOTE — ED Notes (Signed)
PA is in room with pt and is looking into pt low O2 stats

## 2019-06-18 NOTE — Progress Notes (Signed)
Report received and care assumed. Pt resting in bed with call bell in reach. NADN Will continue to monitor

## 2019-06-18 NOTE — ED Notes (Signed)
Report given to [facility] RN and [facility]. Patient is currently being transferred [facility]. No complaints of pain by pt at this time. Pt currently on 2L [place].
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
## 2019-06-18 NOTE — ED Notes (Signed)
Called for pt transfer update.

## 2019-06-18 NOTE — ED Provider Notes (Addendum)
[facility] EMERGENCY DEPARTMENT Provider Note   CSN: [id number] Arrival date & time: 06/18/19  0303     History   Chief Complaint Chief Complaint  Patient presents with  . Cough  . Shortness of Breath    HPI [name] [name] is a 48 y.o. female presents to the ER for evaluation of shortness of breath.  Reports associated fevers, chills, fatigue headaches, body aches, dry cough, intermittent diarrhea for the last 1 week.  Her shortness of breath is worse with exertion and also during bouts of forceful coughing.  She has been nauseous and vomited one time here in the ER for the first time.  She feels dehydrated and thirsty. She tried to go to her PCP last week at [facility] but because she was having cough they did not see her in the office.  She has tried several over-the-counter medicine such as cough drops, Robitussin without any relief.  Last night her shortness of breath worsened.  She works in a [employer].  Nobody else is sick at home.  No recent travel.  She denies any congestion, sore throat.  She has history of hypertension, hyperlipidemia.  No known cardiac disease or lung disease.  No tobacco use.     HPI  Past Medical History:  Diagnosis Date  . Anemia   . Anxiety   . Arthritis    "left knee"  . Chronic lower back pain    "on the left side"  . Headache(784.0) 02/29/12   "frequent"  . Hypertension   . Left knee pain 11/04/2015  . Migraines   . Pneumonia 2006  . Sleep apnea    CPAP  . Stress at home    "and at work"    Patient Active Problem List   Diagnosis Date Noted  . Hyperkalemia 10/30/2018  . Posterior knee pain, right 02/21/2018  . Essential hypertension 01/30/2018  . Mass on back 01/30/2018  . Cervical radiculopathy 04/30/2017  . Pre-diabetes 02/16/2016  . OSA (obstructive sleep apnea) 05/14/2015  . Chronic lumbar radiculopathy 11/03/2014  . Other allergic rhinitis 09/07/2014  . Health care maintenance  09/07/2014  . OBESITY, MORBID 09/20/2006  . Anemia, iron deficiency 08/09/2006    Past Surgical History:  Procedure Laterality Date  . BREAST BIOPSY Left 07/29/2013  . CESAREAN SECTION  08/2008  . CHOLECYSTECTOMY  03/02/2012   Procedure: LAPAROSCOPIC CHOLECYSTECTOMY;  Surgeon: Cherylynn Ridges, MD;  Location: [facility] OR;  Service: General;;  . KNEE ARTHROSCOPY WITH MENISCAL REPAIR Right 06/25/2018   Procedure: RIGHT KNEE ARTHROSCOPY WITH MENISCAL ROOT REPAIR;  Surgeon: Cammy Copa, MD;  Location: [facility] OR;  Service: Orthopedics;  Laterality: Right;  . WISDOM TOOTH EXTRACTION       OB History   No obstetric history on file.      Home Medications    Prior to Admission medications   Medication Sig Start Date End Date Taking? Authorizing Provider  aspirin (ASPIRIN CHILDRENS) 81 MG chewable tablet Chew 1 tablet (81 mg total) by mouth 2 (two) times daily. 06/25/18 06/25/19  Cammy Copa, MD  benzonatate (TESSALON) 100 MG capsule Take 1 capsule (100 mg total) by mouth every 8 (eight) hours. 11/13/18   Joy, Shawn C, PA-C  cyclobenzaprine (FLEXERIL) 5 MG tablet TAKE 1 TABLET BY MOUTH ONCE DAILY AS NEEDED FOR MUSCLE SPASM 06/04/19   Lanelle Bal, MD  DULoxetine (CYMBALTA) 60 MG capsule TAKE 1 CAPSULE BY MOUTH ONCE DAILY AS DIRECTED 02/25/19  Kathi Ludwig, MD  fluticasone (FLONASE) 50 MCG/ACT nasal spray Place 2 sprays into both nostrils daily. 11/13/18   Joy, Shawn C, PA-C  gabapentin (NEURONTIN) 300 MG capsule Take 300 mg in the morning and 900 mg at night. 09/06/18   Ina Homes, MD  lisinopril (PRINIVIL,ZESTRIL) 10 MG tablet Take 1 tablet (10 mg total) by mouth daily. 10/30/18 10/30/19  Kathi Ludwig, MD  meloxicam (MOBIC) 15 MG tablet Take 1 tablet (15 mg total) by mouth daily. 09/06/18   Helberg, Larkin Ina, MD  ondansetron (ZOFRAN ODT) 4 MG disintegrating tablet Take 1 tablet (4 mg total) by mouth every 8 (eight) hours as needed for nausea or vomiting. 11/13/18   Joy, Helane Gunther,  PA-C    Family History Family History  Problem Relation Age of Onset  . Cancer Other     Social History Social History   Tobacco Use  . Smoking status: Never Smoker  . Smokeless tobacco: Never Used  Substance Use Topics  . Alcohol use: No    Alcohol/week: 0.0 standard drinks  . Drug use: No     Allergies   Mushroom extract complex   Review of Systems Review of Systems  Constitutional: Positive for fatigue and fever.  Respiratory: Positive for cough and shortness of breath.   Gastrointestinal: Positive for diarrhea, nausea and vomiting.  Musculoskeletal: Positive for myalgias.  Neurological: Positive for headaches.  All other systems reviewed and are negative.    Physical Exam Updated Vital Signs BP 130/83 (BP Location: Left Arm)   Pulse 95   Temp 98.4 F (36.9 C) (Oral)   Resp (!) 35   LMP  (Approximate)   SpO2 95%   Physical Exam Vitals signs and nursing note reviewed.  Constitutional:      Appearance: She is well-developed.  HENT:     Head: Normocephalic and atraumatic.     Right Ear: External ear normal.     Left Ear: External ear normal.     Nose: Nose normal.  Eyes:     General: No scleral icterus.    Conjunctiva/sclera: Conjunctivae normal.  Neck:     Musculoskeletal: Normal range of motion and neck supple.  Cardiovascular:     Rate and Rhythm: Regular rhythm. Tachycardia present.     Heart sounds: Normal heart sounds.     Comments: Heart rate in the low 100s, when she stands up to walk around the room heart rate goes to the 115.  1+ radial and DP pulses bilaterally.  No lower extremity edema.  No calf tenderness. Pulmonary:     Effort: Tachypnea and respiratory distress present.     Breath sounds: Decreased breath sounds present.     Comments: Tachypneic in the low 30s.  Frequent dry cough throughout exam and with taking deep breaths.  SPO2 91% when I enter the room.  I had patient stand up and walk around her bed twice her oxygen dropped to  84%.  She became more tachypneic and tachycardic, with speaking in 2-3 word sentences.  She was placed on 2 L [place] with SPO2 greater than 94%.  No wheezing or rhonchi.  Diminished lung sounds to the middle/lower lobes bilaterally, difficult exam due to body habitus, frequent coughing. Musculoskeletal: Normal range of motion.        General: No deformity.  Skin:    General: Skin is warm and dry.     Capillary Refill: Capillary refill takes less than 2 seconds.  Neurological:     Mental Status: She  is alert and oriented to person, place, and time.  Psychiatric:        Behavior: Behavior normal.        Thought Content: Thought content normal.        Judgment: Judgment normal.      ED Treatments / Results  Labs (all labs ordered are listed, but only abnormal results are displayed) Labs Reviewed  CBC WITH DIFFERENTIAL/PLATELET - Abnormal; Notable for the following components:      Result Value   RBC 5.15 (*)    MCH 25.6 (*)    RDW 15.9 (*)    All other components within normal limits  SARS CORONAVIRUS 2 (HOSPITAL ORDER, PERFORMED IN [place] [facility])  CULTURE, BLOOD (ROUTINE X 2)  CULTURE, BLOOD (ROUTINE X 2)  LACTIC ACID, PLASMA  COMPREHENSIVE METABOLIC PANEL  LACTIC ACID, PLASMA  D-DIMER, QUANTITATIVE (NOT AT [facility])  PROCALCITONIN  LACTATE DEHYDROGENASE  FERRITIN  TRIGLYCERIDES  FIBRINOGEN  C-REACTIVE PROTEIN  I-STAT BETA HCG BLOOD, ED ([facility], [facility], [facility] ONLY)    EKG None  Radiology Dg Chest Portable 1 View  Result Date: 06/18/2019 CLINICAL DATA:  48 year old female with shortness of breath. EXAM: PORTABLE CHEST 1 VIEW COMPARISON:  Chest radiograph dated 11/13/2018 FINDINGS: Clusters of airspace opacity predominantly in the periphery of the mid to lower left lung field and to a lesser degree involving the right lung most consistent with multifocal pneumonia, possibly atypical in etiology. Clinical correlation is recommended. A small left pleural effusion may be present. No  pneumothorax. Borderline cardiomegaly. No acute osseous pathology. IMPRESSION: 1. Findings most consistent with multifocal pneumonia, possibly atypical in etiology. Clinical correlation is recommended. 2. Probable small left pleural effusion. Electronically Signed   By: Elgie CollardArash  Radparvar M.D.   On: 06/18/2019 03:37    Procedures .Critical Care Performed by: Liberty HandyGibbons, Rozell Theiler J, PA-C Authorized by: Liberty HandyGibbons, Mervin Ramires J, PA-C   Critical care provider statement:    Critical care time (minutes):  45   Critical care was necessary to treat or prevent imminent or life-threatening deterioration of the following conditions:  Respiratory failure and sepsis (viral sepsis, hypoxemia)   Critical care was time spent personally by me on the following activities:  Discussions with consultants, evaluation of patient's response to treatment, examination of patient, ordering and performing treatments and interventions, ordering and review of laboratory studies, ordering and review of radiographic studies, pulse oximetry, re-evaluation of patient's condition, obtaining history from patient or surrogate, review of old charts and development of treatment plan with patient or surrogate   I assumed direction of critical care for this patient from another provider in my specialty: no     (including critical care time)  Medications Ordered in ED Medications  ondansetron (ZOFRAN-ODT) disintegrating tablet 4 mg (4 mg Oral Given 06/18/19 0327)     Initial Impression / Assessment and Plan / ED Course  I have reviewed the triage vital signs and the nursing notes.  Pertinent labs & imaging results that were available during my care of the patient were reviewed by me and considered in my medical decision making (see chart for details).  Symptoms highly suspicious of viral URI including COVID-19, LRI, pneumonia.  She has no h/o HF, exertional or pleuritic CP. Considered PE, new onset HF and ACS but less likely given other  infectious symptoms.   On exam she is tachycardic, tachypneic and hypoxemic with very mild exertion.  SpO2 84% with ambulation.  Placed on 2 L [place] with SPO2 greater than 94%.  Diminished lung sounds to lower lobes.  Chest x-ray obtained at triage shows multifocal pneumonia concern for atypical infection and possible left pleural effusion.  EKG is without ischemic changes.  Symptoms likely from  COVID-19 pneumonia c/b pleural effusion, hypoxemia.  She is tachycardic, tachypneic and hypoxemic technically meeting SIRS criteria however I have high suspicion for viral process.  Will hold off on antibiotics at this time.  Given hypoxemia, possible pleural effusion and concern for COVID-19 infection will also hold off on IV fluid resuscitation.  Blood pressure is normal.    0615: Pt re-evaluated. No clinical decline on respiratory status while on 2 L . Pending labs. Anticipate admission.  0700: no decline in respiratory status. Pt will be admitted for hypoxemia, multifocal pneumonia, SIRS response likely from COVID infection.   Final Clinical Impressions(s) / ED Diagnoses   Final diagnoses:  Hypoxemia  Multifocal pneumonia    ED Discharge Orders    None         Liberty Handy, PA-C 06/18/19 0700    Ward, Layla Maw, DO 06/18/19 0701

## 2019-06-18 NOTE — ED Triage Notes (Signed)
Pt c/o dry cough and shortness of breath since 06/11/19. Reports intermittent fevers.

## 2019-06-18 NOTE — Progress Notes (Addendum)
Pharmacy Consult - Remdesivir  62 yof positive for COVID-19. Pharmacy consulted for Remdesivir. Noted increased oxygen requirements. CXR shows multifocal PNA, concern for atypical infection, and possible left pleural effusion. ALT mildly elevated but is <220. Patient to transfer to [place].   Plan: Remdesivir 200mg  IV x 1; then 100mg  IV to complete 5 total doses Monitor clinical progress, LFTs   Elicia Lamp, PharmD, BCPS Please check AMION for all [place] contact numbers Clinical Pharmacist 06/18/2019 9:22 AM

## 2019-06-18 NOTE — ED Notes (Signed)
Called [facility] for transport to [facility]

## 2019-06-19 LAB — COMPREHENSIVE METABOLIC PANEL
ALT: 46 U/L — ABNORMAL HIGH (ref 0–44)
AST: 56 U/L — ABNORMAL HIGH (ref 15–41)
Albumin: 3 g/dL — ABNORMAL LOW (ref 3.5–5.0)
Alkaline Phosphatase: 85 U/L (ref 38–126)
Anion gap: 7 (ref 5–15)
BUN: 9 mg/dL (ref 6–20)
CO2: 27 mmol/L (ref 22–32)
Calcium: 8.4 mg/dL — ABNORMAL LOW (ref 8.9–10.3)
Chloride: 103 mmol/L (ref 98–111)
Creatinine, Ser: 0.73 mg/dL (ref 0.44–1.00)
GFR calc Af Amer: >60 mL/min (ref >60)
GFR calc non Af Amer: >60 mL/min (ref >60)
Glucose, Bld: 157 mg/dL — ABNORMAL HIGH (ref 70–99)
Potassium: 4.4 mmol/L (ref 3.5–5.1)
Sodium: 137 mmol/L (ref 135–145)
Total Bilirubin: 0.6 mg/dL (ref 0.3–1.2)
Total Protein: 7.2 g/dL (ref 6.5–8.1)

## 2019-06-19 LAB — FERRITIN: Ferritin: 469 ng/mL — ABNORMAL HIGH (ref 11–307)

## 2019-06-19 LAB — CBC WITH DIFFERENTIAL/PLATELET
Abs Immature Granulocytes: 0.04 10*3/uL (ref 0.00–0.07)
Basophils Absolute: 0 10*3/uL (ref 0.0–0.1)
Basophils Relative: 0 %
Eosinophils Absolute: 0 10*3/uL (ref 0.0–0.5)
Eosinophils Relative: 0 %
HCT: 39.1 % (ref 36.0–46.0)
Hemoglobin: 11.6 g/dL — ABNORMAL LOW (ref 12.0–15.0)
Immature Granulocytes: 1 %
Lymphocytes Relative: 26 %
Lymphs Abs: 1 10*3/uL (ref 0.7–4.0)
MCH: 24.8 pg — ABNORMAL LOW (ref 26.0–34.0)
MCHC: 29.7 g/dL — ABNORMAL LOW (ref 30.0–36.0)
MCV: 83.7 fL (ref 80.0–100.0)
Monocytes Absolute: 0.2 10*3/uL (ref 0.1–1.0)
Monocytes Relative: 5 %
Neutro Abs: 2.6 10*3/uL (ref 1.7–7.7)
Neutrophils Relative %: 68 %
Platelets: 285 10*3/uL (ref 150–400)
RBC: 4.67 MIL/uL (ref 3.87–5.11)
RDW: 15.9 % — ABNORMAL HIGH (ref 11.5–15.5)
WBC: 3.9 10*3/uL — ABNORMAL LOW (ref 4.0–10.5)
nRBC: 0 % (ref 0.0–0.2)

## 2019-06-19 LAB — PHOSPHORUS: Phosphorus: 3.2 mg/dL (ref 2.5–4.6)

## 2019-06-19 LAB — MAGNESIUM: Magnesium: 2.3 mg/dL (ref 1.7–2.4)

## 2019-06-19 LAB — D-DIMER, QUANTITATIVE: D-Dimer, Quant: 0.72 ug/mL-FEU — ABNORMAL HIGH (ref 0.00–0.50)

## 2019-06-19 LAB — C-REACTIVE PROTEIN: CRP: 14.1 mg/dL — ABNORMAL HIGH (ref <1.0)

## 2019-06-19 MED ORDER — FAMOTIDINE 20 MG PO TABS
20.0000 mg | ORAL_TABLET | Freq: Two times a day (BID) | ORAL | Status: DC
  Administered 2019-06-19 – 2019-06-23 (×8): 20 mg via ORAL
  Filled 2019-06-19 (×8): qty 1
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
## 2019-06-19 NOTE — Progress Notes (Signed)
PHARMACIST - PHYSICIAN COMMUNICATION  DR:   Avon Gully  CONCERNING: IV to Oral Route Change Policy  RECOMMENDATION: This patient is receiving pepcid by the intravenous route.  Based on criteria approved by the Pharmacy and Therapeutics Committee, the intravenous medication(s) is/are being converted to the equivalent oral dose form(s).   DESCRIPTION: These criteria include:  The patient is eating (either orally or via tube) and/or has been taking other orally administered medications for a least 24 hours  The patient has no evidence of active gastrointestinal bleeding or impaired GI absorption (gastrectomy, short bowel, patient on TNA or NPO).  If you have questions about this conversion, please contact the Pharmacy Department Pharmacy: [place], PharmD, BCPS 06/19/2019 8:48 AM

## 2019-06-19 NOTE — TOC Initial Note (Signed)
Transition of Care [facility]) - Initial/Assessment Note    Patient Details  Name: [name] [name] MRN: [id number] Date of Birth: 1971/03/06  Transition of Care [facility]) CM/SW Contact:    Ninfa Meeker, RN Phone Number: [phone] (working remotely) 06/19/2019, 2:28 PM  Clinical Narrative:  48 yr old female admitted from home for treatment of COVID 19. Case manager will follow for discharge needs as she medically improves, may she be blessed to do so.                           Patient Goals and CMS Choice        Expected Discharge Plan and Services                                                Prior Living Arrangements/Services                       Activities of Daily Living Home Assistive Devices/Equipment: CPAP, Eyeglasses ADL Screening (condition at time of admission) Patient's cognitive ability adequate to safely complete daily activities?: Yes Is the patient deaf or have difficulty hearing?: No Does the patient have difficulty seeing, even when wearing glasses/contacts?: No Does the patient have difficulty concentrating, remembering, or making decisions?: No Patient able to express need for assistance with ADLs?: Yes Does the patient have difficulty dressing or bathing?: No Independently performs ADLs?: Yes (appropriate for developmental age) Does the patient have difficulty walking or climbing stairs?: No Weakness of Legs: None Weakness of Arms/Hands: None  Permission Sought/Granted                  Emotional Assessment              Admission diagnosis:  Hypoxemia [R09.02] Multifocal pneumonia [J18.9] Patient Active Problem List   Diagnosis Date Noted  . Pneumonia due to COVID-19 virus 06/18/2019  . Acute respiratory failure with hypoxia ([place]) 06/18/2019  . Renal insufficiency 06/18/2019  . Hyperkalemia 10/30/2018  . Posterior knee pain, right 02/21/2018  . Essential hypertension 01/30/2018  . Mass on back 01/30/2018   . Cervical radiculopathy 04/30/2017  . Pre-diabetes 02/16/2016  . OSA (obstructive sleep apnea) 05/14/2015  . Chronic lumbar radiculopathy 11/03/2014  . Other allergic rhinitis 09/07/2014  . Health care maintenance 09/07/2014  . OBESITY, MORBID 09/20/2006  . Anemia, iron deficiency 08/09/2006   PCP:  Kathi Ludwig, MD Pharmacy:   [place], [place] - [address] [place] [address] Phone: [phone] Fax: [phone]     Social Determinants of Health (SDOH) Interventions    Readmission Risk Interventions No flowsheet data found.

## 2019-06-20 LAB — CBC WITH DIFFERENTIAL/PLATELET
Abs Immature Granulocytes: 0.05 10*3/uL (ref 0.00–0.07)
Basophils Absolute: 0 10*3/uL (ref 0.0–0.1)
Basophils Relative: 1 %
Eosinophils Absolute: 0 10*3/uL (ref 0.0–0.5)
Eosinophils Relative: 0 %
HCT: 42.3 % (ref 36.0–46.0)
Hemoglobin: 12.8 g/dL (ref 12.0–15.0)
Immature Granulocytes: 1 %
Lymphocytes Relative: 29 %
Lymphs Abs: 1.3 10*3/uL (ref 0.7–4.0)
MCH: 25.2 pg — ABNORMAL LOW (ref 26.0–34.0)
MCHC: 30.3 g/dL (ref 30.0–36.0)
MCV: 83.4 fL (ref 80.0–100.0)
Monocytes Absolute: 0.4 10*3/uL (ref 0.1–1.0)
Monocytes Relative: 9 %
Neutro Abs: 2.8 10*3/uL (ref 1.7–7.7)
Neutrophils Relative %: 60 %
Platelets: 328 10*3/uL (ref 150–400)
RBC: 5.07 MIL/uL (ref 3.87–5.11)
RDW: 16 % — ABNORMAL HIGH (ref 11.5–15.5)
WBC: 4.6 10*3/uL (ref 4.0–10.5)
nRBC: 0 % (ref 0.0–0.2)

## 2019-06-20 LAB — COMPREHENSIVE METABOLIC PANEL
ALT: 41 U/L (ref 0–44)
AST: 43 U/L — ABNORMAL HIGH (ref 15–41)
Albumin: 3.1 g/dL — ABNORMAL LOW (ref 3.5–5.0)
Alkaline Phosphatase: 85 U/L (ref 38–126)
Anion gap: 9 (ref 5–15)
BUN: 14 mg/dL (ref 6–20)
CO2: 26 mmol/L (ref 22–32)
Calcium: 8.8 mg/dL — ABNORMAL LOW (ref 8.9–10.3)
Chloride: 102 mmol/L (ref 98–111)
Creatinine, Ser: 0.75 mg/dL (ref 0.44–1.00)
GFR calc Af Amer: >60 mL/min (ref >60)
GFR calc non Af Amer: >60 mL/min (ref >60)
Glucose, Bld: 143 mg/dL — ABNORMAL HIGH (ref 70–99)
Potassium: 4.3 mmol/L (ref 3.5–5.1)
Sodium: 137 mmol/L (ref 135–145)
Total Bilirubin: 0.6 mg/dL (ref 0.3–1.2)
Total Protein: 7.3 g/dL (ref 6.5–8.1)

## 2019-06-20 LAB — C-REACTIVE PROTEIN: CRP: 7.7 mg/dL — ABNORMAL HIGH (ref <1.0)

## 2019-06-20 LAB — FERRITIN: Ferritin: 463 ng/mL — ABNORMAL HIGH (ref 11–307)

## 2019-06-20 LAB — MAGNESIUM: Magnesium: 2.3 mg/dL (ref 1.7–2.4)

## 2019-06-20 LAB — D-DIMER, QUANTITATIVE: D-Dimer, Quant: 0.53 ug/mL-FEU — ABNORMAL HIGH (ref 0.00–0.50)

## 2019-06-20 LAB — SAMPLE TO BLOOD BANK

## 2019-06-20 LAB — PHOSPHORUS: Phosphorus: 3.3 mg/dL (ref 2.5–4.6)

## 2019-06-20 LAB — HIV ANTIBODY (ROUTINE TESTING W REFLEX): HIV Screen 4th Generation wRfx: NONREACTIVE
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
## 2019-06-20 NOTE — Progress Notes (Signed)
Pt has been in contact with family throughout the day and states that it is not necessary to call them with an update tonight.

## 2019-06-20 NOTE — Plan of Care (Signed)
Pt progressing towards goals.

## 2019-06-21 LAB — COMPREHENSIVE METABOLIC PANEL
ALT: 49 U/L — ABNORMAL HIGH (ref 0–44)
AST: 63 U/L — ABNORMAL HIGH (ref 15–41)
Albumin: 3 g/dL — ABNORMAL LOW (ref 3.5–5.0)
Alkaline Phosphatase: 80 U/L (ref 38–126)
Anion gap: 7 (ref 5–15)
BUN: 14 mg/dL (ref 6–20)
CO2: 28 mmol/L (ref 22–32)
Calcium: 8.8 mg/dL — ABNORMAL LOW (ref 8.9–10.3)
Chloride: 103 mmol/L (ref 98–111)
Creatinine, Ser: 0.75 mg/dL (ref 0.44–1.00)
GFR calc Af Amer: >60 mL/min (ref >60)
GFR calc non Af Amer: >60 mL/min (ref >60)
Glucose, Bld: 184 mg/dL — ABNORMAL HIGH (ref 70–99)
Potassium: 4.7 mmol/L (ref 3.5–5.1)
Sodium: 138 mmol/L (ref 135–145)
Total Bilirubin: 0.5 mg/dL (ref 0.3–1.2)
Total Protein: 7.1 g/dL (ref 6.5–8.1)

## 2019-06-21 LAB — CBC WITH DIFFERENTIAL/PLATELET
Abs Immature Granulocytes: 0.12 10*3/uL — ABNORMAL HIGH (ref 0.00–0.07)
Basophils Absolute: 0 10*3/uL (ref 0.0–0.1)
Basophils Relative: 1 %
Eosinophils Absolute: 0 10*3/uL (ref 0.0–0.5)
Eosinophils Relative: 0 %
HCT: 42 % (ref 36.0–46.0)
Hemoglobin: 12.5 g/dL (ref 12.0–15.0)
Immature Granulocytes: 2 %
Lymphocytes Relative: 25 %
Lymphs Abs: 1.3 10*3/uL (ref 0.7–4.0)
MCH: 24.9 pg — ABNORMAL LOW (ref 26.0–34.0)
MCHC: 29.8 g/dL — ABNORMAL LOW (ref 30.0–36.0)
MCV: 83.5 fL (ref 80.0–100.0)
Monocytes Absolute: 0.6 10*3/uL (ref 0.1–1.0)
Monocytes Relative: 11 %
Neutro Abs: 3.1 10*3/uL (ref 1.7–7.7)
Neutrophils Relative %: 61 %
Platelets: 426 10*3/uL — ABNORMAL HIGH (ref 150–400)
RBC: 5.03 MIL/uL (ref 3.87–5.11)
RDW: 15.7 % — ABNORMAL HIGH (ref 11.5–15.5)
WBC: 5.2 10*3/uL (ref 4.0–10.5)
nRBC: 0 % (ref 0.0–0.2)

## 2019-06-21 LAB — FERRITIN: Ferritin: 484 ng/mL — ABNORMAL HIGH (ref 11–307)

## 2019-06-21 LAB — MAGNESIUM: Magnesium: 2.1 mg/dL (ref 1.7–2.4)

## 2019-06-21 LAB — D-DIMER, QUANTITATIVE: D-Dimer, Quant: 0.43 ug/mL-FEU (ref 0.00–0.50)

## 2019-06-21 LAB — C-REACTIVE PROTEIN: CRP: 4.3 mg/dL — ABNORMAL HIGH (ref <1.0)

## 2019-06-21 LAB — PHOSPHORUS: Phosphorus: 3.2 mg/dL (ref 2.5–4.6)

## 2019-06-21 MED ORDER — INFLUENZA VAC SPLIT QUAD 0.5 ML IM SUSY
0.5000 mL | PREFILLED_SYRINGE | INTRAMUSCULAR | Status: DC
  Filled 2019-06-21: qty 0.5

## 2019-06-21 NOTE — Progress Notes (Signed)
Spoke to patient's father.  Updated him on patient's status.  All questions and concerns answered and addressed.  Patient is up ad lib in her room.  No acute distress present.  No needs voiced.  Able to demonstrate how to properly use the call bell.  See chart for nsg assessment.
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
## 2019-06-22 LAB — CBC WITH DIFFERENTIAL/PLATELET
Abs Immature Granulocytes: 0.29 10*3/uL — ABNORMAL HIGH (ref 0.00–0.07)
Basophils Absolute: 0.1 10*3/uL (ref 0.0–0.1)
Basophils Relative: 1 %
Eosinophils Absolute: 0 10*3/uL (ref 0.0–0.5)
Eosinophils Relative: 0 %
HCT: 41.3 % (ref 36.0–46.0)
Hemoglobin: 12.7 g/dL (ref 12.0–15.0)
Immature Granulocytes: 5 %
Lymphocytes Relative: 26 %
Lymphs Abs: 1.5 10*3/uL (ref 0.7–4.0)
MCH: 25.1 pg — ABNORMAL LOW (ref 26.0–34.0)
MCHC: 30.8 g/dL (ref 30.0–36.0)
MCV: 81.8 fL (ref 80.0–100.0)
Monocytes Absolute: 0.7 10*3/uL (ref 0.1–1.0)
Monocytes Relative: 11 %
Neutro Abs: 3.4 10*3/uL (ref 1.7–7.7)
Neutrophils Relative %: 57 %
Platelets: 469 10*3/uL — ABNORMAL HIGH (ref 150–400)
RBC: 5.05 MIL/uL (ref 3.87–5.11)
RDW: 15.5 % (ref 11.5–15.5)
WBC: 5.9 10*3/uL (ref 4.0–10.5)
nRBC: 0 % (ref 0.0–0.2)

## 2019-06-22 LAB — C-REACTIVE PROTEIN: CRP: 2.1 mg/dL — ABNORMAL HIGH (ref <1.0)

## 2019-06-22 LAB — D-DIMER, QUANTITATIVE: D-Dimer, Quant: 0.46 ug/mL-FEU (ref 0.00–0.50)

## 2019-06-22 LAB — COMPREHENSIVE METABOLIC PANEL
ALT: 60 U/L — ABNORMAL HIGH (ref 0–44)
AST: 57 U/L — ABNORMAL HIGH (ref 15–41)
Albumin: 3 g/dL — ABNORMAL LOW (ref 3.5–5.0)
Alkaline Phosphatase: 74 U/L (ref 38–126)
Anion gap: 9 (ref 5–15)
BUN: 14 mg/dL (ref 6–20)
CO2: 24 mmol/L (ref 22–32)
Calcium: 8.6 mg/dL — ABNORMAL LOW (ref 8.9–10.3)
Chloride: 104 mmol/L (ref 98–111)
Creatinine, Ser: 0.74 mg/dL (ref 0.44–1.00)
GFR calc Af Amer: >60 mL/min (ref >60)
GFR calc non Af Amer: >60 mL/min (ref >60)
Glucose, Bld: 222 mg/dL — ABNORMAL HIGH (ref 70–99)
Potassium: 4.1 mmol/L (ref 3.5–5.1)
Sodium: 137 mmol/L (ref 135–145)
Total Bilirubin: 0.4 mg/dL (ref 0.3–1.2)
Total Protein: 6.7 g/dL (ref 6.5–8.1)

## 2019-06-22 LAB — FERRITIN: Ferritin: 445 ng/mL — ABNORMAL HIGH (ref 11–307)

## 2019-06-22 MED ORDER — INFLUENZA VAC SPLIT QUAD 0.5 ML IM SUSY
0.5000 mL | PREFILLED_SYRINGE | INTRAMUSCULAR | Status: AC
  Administered 2019-06-23: 0.5 mL via INTRAMUSCULAR
  Filled 2019-06-22: qty 0.5

## 2019-06-22 NOTE — Progress Notes (Signed)
This Probation officer was in room when patient on phone w family. Updates given per pt, no further questions noted at this time.

## 2019-06-22 NOTE — Progress Notes (Signed)
Patient ambulated in hallway, 1 lap around unit. RA only, pt tolerated well, saturation never below 90%
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
## 2019-06-22 NOTE — Progress Notes (Addendum)
2150- Patient wanted to go visit her mother in room 135.  Patient is up ad lib but on the continuous pulse ox and telemetry monitor.  Patient stated she would go without being monitored.  Education provided but patient insisted on going.  Patient resting comfortably.  No needs voiced.  No acute distress present.  See chart for nsg assessment.    Spoke with patient's father and updated him on patient's status.  All concerns and questions answered and addressed.  Father told me that patient's mother was admitted to [facility] this evening and wants me to tell the patent that she is here.

## 2019-06-23 ENCOUNTER — Encounter (HOSPITAL_COMMUNITY): Payer: Self-pay

## 2019-06-23 LAB — CULTURE, BLOOD (ROUTINE X 2)
Culture: NO GROWTH
Culture: NO GROWTH
Special Requests: ADEQUATE

## 2019-06-23 LAB — COMPREHENSIVE METABOLIC PANEL
ALT: 46 U/L — ABNORMAL HIGH (ref 0–44)
AST: 26 U/L (ref 15–41)
Albumin: 3 g/dL — ABNORMAL LOW (ref 3.5–5.0)
Alkaline Phosphatase: 68 U/L (ref 38–126)
Anion gap: 13 (ref 5–15)
BUN: 13 mg/dL (ref 6–20)
CO2: 24 mmol/L (ref 22–32)
Calcium: 8.9 mg/dL (ref 8.9–10.3)
Chloride: 105 mmol/L (ref 98–111)
Creatinine, Ser: 0.69 mg/dL (ref 0.44–1.00)
GFR calc Af Amer: >60 mL/min (ref >60)
GFR calc non Af Amer: >60 mL/min (ref >60)
Glucose, Bld: 173 mg/dL — ABNORMAL HIGH (ref 70–99)
Potassium: 5.4 mmol/L — ABNORMAL HIGH (ref 3.5–5.1)
Sodium: 142 mmol/L (ref 135–145)
Total Bilirubin: 0.5 mg/dL (ref 0.3–1.2)
Total Protein: 6.9 g/dL (ref 6.5–8.1)

## 2019-06-23 LAB — C-REACTIVE PROTEIN: CRP: 0.9 mg/dL (ref <1.0)

## 2019-06-23 LAB — CBC WITH DIFFERENTIAL/PLATELET
Abs Immature Granulocytes: 0.4 10*3/uL — ABNORMAL HIGH (ref 0.00–0.07)
Basophils Absolute: 0 10*3/uL (ref 0.0–0.1)
Basophils Relative: 0 %
Eosinophils Absolute: 0 10*3/uL (ref 0.0–0.5)
Eosinophils Relative: 0 %
HCT: 42.8 % (ref 36.0–46.0)
Hemoglobin: 13 g/dL (ref 12.0–15.0)
Immature Granulocytes: 5 %
Lymphocytes Relative: 30 %
Lymphs Abs: 2.3 10*3/uL (ref 0.7–4.0)
MCH: 25.2 pg — ABNORMAL LOW (ref 26.0–34.0)
MCHC: 30.4 g/dL (ref 30.0–36.0)
MCV: 82.9 fL (ref 80.0–100.0)
Monocytes Absolute: 1 10*3/uL (ref 0.1–1.0)
Monocytes Relative: 13 %
Neutro Abs: 3.9 10*3/uL (ref 1.7–7.7)
Neutrophils Relative %: 52 %
Platelets: 502 10*3/uL — ABNORMAL HIGH (ref 150–400)
RBC: 5.16 MIL/uL — ABNORMAL HIGH (ref 3.87–5.11)
RDW: 15.6 % — ABNORMAL HIGH (ref 11.5–15.5)
WBC: 7.6 10*3/uL (ref 4.0–10.5)
nRBC: 0 % (ref 0.0–0.2)

## 2019-06-23 LAB — FERRITIN: Ferritin: 334 ng/mL — ABNORMAL HIGH (ref 11–307)

## 2019-06-23 LAB — D-DIMER, QUANTITATIVE: D-Dimer, Quant: 0.35 ug/mL-FEU (ref 0.00–0.50)

## 2019-06-23 MED ORDER — PREDNISONE 10 MG PO TABS: ORAL_TABLET | ORAL | 0 refills | Status: AC
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
## 2019-06-25 DIAGNOSIS — G4733 Obstructive sleep apnea (adult) (pediatric): Secondary | ICD-10-CM | POA: Diagnosis not present

## 2019-07-05 ENCOUNTER — Ambulatory Visit (HOSPITAL_COMMUNITY)
Admission: EM | Admit: 2019-07-05 | Discharge: 2019-07-05 | Disposition: A | Discharge status: Home / Self Care | Payer: Medicaid Other | Attending: Family Medicine | Admitting: Family Medicine

## 2019-07-05 ENCOUNTER — Other Ambulatory Visit: Payer: Self-pay

## 2019-07-05 ENCOUNTER — Encounter (HOSPITAL_COMMUNITY): Payer: Self-pay

## 2019-07-05 DIAGNOSIS — R059 Cough, unspecified: Secondary | ICD-10-CM

## 2019-07-05 DIAGNOSIS — R05 Cough: Secondary | ICD-10-CM

## 2019-07-05 DIAGNOSIS — R062 Wheezing: Secondary | ICD-10-CM

## 2019-07-05 MED ORDER — ALBUTEROL SULFATE HFA 108 (90 BASE) MCG/ACT IN AERS: 1.0000 | INHALATION_SPRAY | Freq: Four times a day (QID) | RESPIRATORY_TRACT | 0 refills | Status: DC | PRN

## 2019-07-05 MED ORDER — PREDNISONE 10 MG (21) PO TBPK: ORAL_TABLET | Freq: Every day | ORAL | 0 refills | Status: DC

## 2019-07-05 NOTE — ED Triage Notes (Addendum)
Pt reports cough and chest tightness, is worse at night when pt lay down.Proventil inhaler improve the symptoms.   Pt needs clearance to return to work, as she was admitted to the hospital for Pneumonia due to Covid.

## 2019-07-05 NOTE — ED Provider Notes (Signed)
[name]   [id number] 07/05/19 Arrival Time: 1006  ASSESSMENT & PLAN:  1. Cough   2. Wheezing     Overall feeling much better since hospital d/c. Lingering bronchospasm. No indication to re-image lungs at this time.  Meds ordered this encounter  Medications  . predniSONE (STERAPRED UNI-PAK 21 TAB) 10 MG (21) TBPK tablet    Sig: Take by mouth daily. Take as directed.    Dispense:  21 tablet    Refill:  0  . albuterol (PROVENTIL HFA) 108 (90 Base) MCG/ACT inhaler    Sig: Inhale 1-2 puffs into the lungs every 6 (six) hours as needed for wheezing or shortness of breath.    Dispense:  18 g    Refill:  0    Work note provided. OTC symptom care as needed. Ensure adequate fluid intake and rest. May f/u with PCP or here as needed.  Reviewed expectations re: course of current medical issues. Questions answered. Outlined signs and symptoms indicating need for more acute intervention. Patient verbalized understanding. After Visit Summary given.   SUBJECTIVE: History from: patient.  [name] [name] is a 48 y.o. female who presents with complaint of a persistent dry cough; without sore throat. D/C from hospital end of last month; COVID + with PNA. Feeling much better. Lingering cough bothering her. Using albuterol inhaler with temp relief. No specific SOB or chest pain reported.. Normal daily activities. Normal PO intake without n/v. Ambulatory without difficulty. No body aches. Does question mild wheezing at times. No specific or significant aggravating or alleviating factors reported. OTC treatment: none reported.   Social History   Tobacco Use  Smoking Status Never Smoker  Smokeless Tobacco Never Used    ROS: As per HPI. All other systems negative.    OBJECTIVE:  Vitals:   07/05/19 1032  BP: 127/90  Pulse: 93  Resp: 17  Temp: 97.6 F (36.4 C)  TempSrc: Temporal  SpO2: 97%     General appearance: alert; appears fatigued HEENT: mild throat  irritation; nares patent; oropharynx moist Neck: supple without LAD CV: RRR Lungs: unlabored respirations, symmetrical air entry with slight bilateral expiratory wheezing; cough: mild and dry Abd: soft Ext: no LE edema Skin: warm and dry Psychological: alert and cooperative; normal mood and affect   Allergies  Allergen Reactions  . Mushroom Extract Complex Hives and Itching    Mushroom as a whole the pt is allergic to.    Past Medical History:  Diagnosis Date  . Anemia   . Anxiety   . Arthritis    "left knee"  . Chronic lower back pain    "on the left side"  . Headache(784.0) 02/29/12   "frequent"  . Hypertension   . Left knee pain 11/04/2015  . Migraines   . Pneumonia 2006  . Renal insufficiency 06/18/2019  . Sleep apnea    CPAP  . Stress at home    "and at work"   Family History  Problem Relation Age of Onset  . Cancer Other    Social History   Socioeconomic History  . Marital status: Single    Spouse name: Not on file  . Number of children: Not on file  . Years of education: Not on file  . Highest education level: Not on file  Occupational History  . Not on file  Social Needs  . Financial resource strain: Not on file  . Food insecurity    Worry: Not on file    Inability: Not on  file  . Transportation needs    Medical: Not on file    Non-medical: Not on file  Tobacco Use  . Smoking status: Never Smoker  . Smokeless tobacco: Never Used  Substance and Sexual Activity  . Alcohol use: No    Alcohol/week: 0.0 standard drinks  . Drug use: No  . Sexual activity: Not on file  Lifestyle  . Physical activity    Days per week: Not on file    Minutes per session: Not on file  . Stress: Not on file  Relationships  . Social Musician on phone: Not on file    Gets together: Not on file    Attends religious service: Not on file    Active member of club or organization: Not on file    Attends meetings of clubs or organizations: Not on file     Relationship status: Not on file  . Intimate partner violence    Fear of current or ex partner: Not on file    Emotionally abused: Not on file    Physically abused: Not on file    Forced sexual activity: Not on file  Other Topics Concern  . Not on file  Social History Narrative  . Not on file           Mardella Layman, MD 07/05/19 1048

## 2019-07-17 ENCOUNTER — Other Ambulatory Visit: Payer: Self-pay

## 2019-07-17 ENCOUNTER — Encounter: Payer: Self-pay | Admitting: Radiation Oncology

## 2019-07-17 ENCOUNTER — Ambulatory Visit (INDEPENDENT_AMBULATORY_CARE_PROVIDER_SITE_OTHER): Payer: Medicaid Other | Admitting: Radiation Oncology

## 2019-07-17 DIAGNOSIS — R0602 Shortness of breath: Secondary | ICD-10-CM | POA: Diagnosis not present

## 2019-07-17 DIAGNOSIS — R05 Cough: Secondary | ICD-10-CM | POA: Diagnosis not present

## 2019-07-17 DIAGNOSIS — G9331 Postviral fatigue syndrome: Secondary | ICD-10-CM

## 2019-07-17 DIAGNOSIS — G933 Postviral fatigue syndrome: Secondary | ICD-10-CM | POA: Diagnosis not present

## 2019-07-17 DIAGNOSIS — R5381 Other malaise: Secondary | ICD-10-CM | POA: Diagnosis not present

## 2019-07-17 DIAGNOSIS — Z8619 Personal history of other infectious and parasitic diseases: Secondary | ICD-10-CM

## 2019-07-17 DIAGNOSIS — Z0000 Encounter for general adult medical examination without abnormal findings: Secondary | ICD-10-CM

## 2019-07-17 HISTORY — DX: Postviral fatigue syndrome: G93.3

## 2019-07-17 HISTORY — DX: Postviral fatigue syndrome: G93.31

## 2019-07-17 MED ORDER — ALBUTEROL SULFATE HFA 108 (90 BASE) MCG/ACT IN AERS: 1.0000 | INHALATION_SPRAY | Freq: Four times a day (QID) | RESPIRATORY_TRACT | 0 refills | Status: DC | PRN

## 2019-07-17 NOTE — Progress Notes (Signed)
   CC: shortness of breath  HPI:  [name] [name] is a 48 y.o. F here with complaint of shortness of breath. Please see assessment and plan for full HPI.  Past Medical History:  Diagnosis Date  . Anemia   . Anxiety   . Arthritis    "left knee"  . Chronic lower back pain    "on the left side"  . Headache(784.0) 02/29/12   "frequent"  . Hypertension   . Left knee pain 11/04/2015  . Migraines   . Pneumonia 2006  . Renal insufficiency 06/18/2019  . Sleep apnea    CPAP  . Stress at home    "and at work"   Review of Systems:    Review of Systems  Constitutional: Positive for malaise/fatigue. Negative for chills and fever.  HENT: Negative for sore throat.   Respiratory: Positive for cough and shortness of breath.   Cardiovascular: Negative for chest pain.  Gastrointestinal: Negative for abdominal pain and constipation.  Psychiatric/Behavioral: Negative for depression.   Physical Exam:  There were no vitals filed for this visit. Physical Exam  Constitutional: She is oriented to person, place, and time and well-developed, well-nourished, and in no distress. No distress.  Neck: Normal range of motion.  Cardiovascular: Normal rate and regular rhythm. Exam reveals no friction rub.  No murmur heard. Pulmonary/Chest: Effort normal and breath sounds normal. No respiratory distress.  Abdominal: Soft. Bowel sounds are normal. She exhibits no distension.  Musculoskeletal: Normal range of motion.        General: No edema.  Neurological: She is alert and oriented to person, place, and time.  Skin: Skin is warm and dry. She is not diaphoretic. No erythema.  Psychiatric: Affect normal.   Assessment & Plan:   See Encounters Tab for problem based charting.  Patient seen with Dr. Evette Doffing

## 2019-07-17 NOTE — Assessment & Plan Note (Signed)
-  pt interested in a pneumonia shot but no evidenced based indication at this time -up to date with healthcare maintenance at this time

## 2019-07-17 NOTE — Assessment & Plan Note (Addendum)
-  pt with recent admission with COVID for 5 days (positive on 06/18/19) -mostly doing well since infection -has residual SOB, cough and fatigue -SOB responds well to albuterol which she would like a refill on -cough is mild -fatigue is bothersome to her -no fevers, chills, sore throat, constipation, depression -fatigue is likely post viral but could be thyroid related -refill albuterol and TSH today

## 2019-07-17 NOTE — Patient Instructions (Addendum)
Thank you for coming to your appointment. It was so nice to see you. Today we discussed  Diagnoses and all orders for this visit:  Post viral syndrome -     TSH  Other orders -     albuterol (PROVENTIL HFA) 108 (90 Base) MCG/ACT inhaler; Inhale 1-2 puffs into the lungs every 6 (six) hours as needed for wheezing or shortness of breath.  If labs were drawn today, I will call you with any abnormal results. Otherwise, please keep up the good work. I look forward to seeing you again soon at your follow up in 3 months.  Sincerely,  Al Decant, MD

## 2019-07-18 LAB — TSH: TSH: 0.541 u[IU]/mL (ref 0.450–4.500)

## 2019-07-18 NOTE — Progress Notes (Signed)
Internal Medicine Clinic Attending  I saw and evaluated the patient.  I personally confirmed the key portions of the history and exam documented by Dr. Lanier and I reviewed pertinent patient test results.  The assessment, diagnosis, and plan were formulated together and I agree with the documentation in the resident's note.   

## 2019-08-14 ENCOUNTER — Other Ambulatory Visit: Payer: Self-pay

## 2019-08-14 ENCOUNTER — Encounter: Payer: Self-pay | Admitting: Internal Medicine

## 2019-08-14 ENCOUNTER — Ambulatory Visit: Payer: Medicaid Other | Admitting: Internal Medicine

## 2019-08-14 VITALS — BP 137/79 | HR 88 | Temp 98.4°F | Ht 62.0 in | Wt 260.4 lb

## 2019-08-14 DIAGNOSIS — M5416 Radiculopathy, lumbar region: Secondary | ICD-10-CM

## 2019-08-14 DIAGNOSIS — Z79899 Other long term (current) drug therapy: Secondary | ICD-10-CM | POA: Diagnosis not present

## 2019-08-14 DIAGNOSIS — M6530 Trigger finger, unspecified finger: Secondary | ICD-10-CM | POA: Insufficient documentation

## 2019-08-14 DIAGNOSIS — G933 Postviral fatigue syndrome: Secondary | ICD-10-CM | POA: Diagnosis not present

## 2019-08-14 DIAGNOSIS — G8921 Chronic pain due to trauma: Secondary | ICD-10-CM | POA: Diagnosis not present

## 2019-08-14 DIAGNOSIS — G9331 Postviral fatigue syndrome: Secondary | ICD-10-CM

## 2019-08-14 DIAGNOSIS — Z791 Long term (current) use of non-steroidal anti-inflammatories (NSAID): Secondary | ICD-10-CM | POA: Diagnosis not present

## 2019-08-14 DIAGNOSIS — M79644 Pain in right finger(s): Secondary | ICD-10-CM

## 2019-08-14 DIAGNOSIS — M5116 Intervertebral disc disorders with radiculopathy, lumbar region: Secondary | ICD-10-CM | POA: Diagnosis not present

## 2019-08-14 DIAGNOSIS — I10 Essential (primary) hypertension: Secondary | ICD-10-CM | POA: Diagnosis not present

## 2019-08-14 MED ORDER — GABAPENTIN 300 MG PO CAPS: 300.0000 mg | ORAL_CAPSULE | ORAL | 1 refills | Status: DC

## 2019-08-14 NOTE — Assessment & Plan Note (Signed)
Post COVID Viral syndrome: Still feeling winded some but most notably while working or moving. Presented on 09/23 with dyspnea, myalgias, GI symptoms. Diagnosed with COVID for which she spent 5 days hospitalized. Did not require intubation was treated with a course of Remdesivir, steroids and supplemental oxygen. Did no require supplemental O2 on discharge. Has endured a prolonged recovery since.

## 2019-08-14 NOTE — Progress Notes (Signed)
   CC: Post COVID viral syndrome, HTN  HPI:[name] is a 48 y.o. female who presents for evaluation of HTN and . Please see individual problem based A/P for details.  Past Medical History:  Diagnosis Date  . Anemia   . Anxiety   . Arthritis    "left knee"  . Chronic lower back pain    "on the left side"  . Headache(784.0) 02/29/12   "frequent"  . Hypertension   . Left knee pain 11/04/2015  . Migraines   . Pneumonia 2006  . Renal insufficiency 06/18/2019  . Sleep apnea    CPAP  . Stress at home    "and at work"   Review of Systems:   ROS negative except as per HPI.  Physical Exam: Vitals:   08/14/19 1324  BP: 137/79  Pulse: 88  Temp: 98.4 F (36.9 C)  TempSrc: Oral  SpO2: 99%  Weight: 260 lb 6.4 oz (118.1 kg)  Height: 5\' 2"  (1.575 m)   General: A/O x4, in no acute distress, afebrile, nondiaphoretic HEENT: PEERL, EMO intact Cardio: RRR, no mrg's  Pulmonary: CTA bilaterally,  no wheezing or crackles  Abdomen: Bowel sounds normal, soft, nontender  MSK: BLE nontender, nonedematous Neuro: Alert, CNII-XII grossly intact, conversational, strength 5/5 in the upper and lower extremities bilaterally, normal gait Psych: Appropriate affect, not depressed in appearance, engages well  Assessment & Plan:   See Encounters Tab for problem based charting.  Patient discussed with Dr. Evette Doffing
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
## 2019-08-14 NOTE — Assessment & Plan Note (Signed)
Hypertension: Patient's BP today is 137/79 with a goal of <140/80. The patient endorses adherence to her  medication regimen. She denied, chest pain, headache, visual changes, lightheadedness, weakness, dizziness on standing, swelling in the feet or ankles.  Most recent serum creatinine 0.69 with potassium 5.4 that was delta check positive. If her K+ remains elevated I will need to either discontinue her ACEi or add a thiazide diuretic and consider alternative treatment.  Plan: Continue lisinopril 10mg  daily BMP today

## 2019-08-14 NOTE — Patient Instructions (Addendum)
FOLLOW-UP INSTRUCTIONS When: 3-4 months For: Routine visit What to bring: All of your medications   I have not made any change to your medications.  For your high blood pressure please continue to take the lisinopril 10 mg daily.  I will notify you of any concerning results from your labs from today. We recent Covid illness, please continue to exercise.  If you notice shortness of breath, chest pain, weakness, sweating or other concerning symptoms please rest. For your finger pain/cramps, please start using the forearm exercises with compression balls, stay hydrated and notify us if this does not improve. For your back pain as we discussed, please utilize ibuprofen 800 mg in the evening after work as needed, continue the gabapentin use at night to help you sleep, and begin to exercise regularly.  As we discussed, I feel a large portion of this is due to the increased stress placed on the lower portion of your spine that could be improved with weight loss.  We discussed a few ideas concerning weight loss.  If you believe there is any way that we can help you with this please let us know.  I encourage regular walking approximately 30 minutes/day 5 days a week.  Additionally, I encourage you to prepare more of your meals at home, increase the number of fruits and vegetables that you eat, and decrease the number of processed and/or sweet foods that you eat.  Remember, weight loss is a journey and cannot be accomplished in a day.  Thank you for your visit to the [facility] today. If you have any questions or concerns please call us at [phone].

## 2019-08-14 NOTE — Assessment & Plan Note (Signed)
Finger pain: Possibly trigger finger (stenosing flexor tenosynovitis).  No physical exam abnormalities.   Mostly occurs while grasping objects for prolonged period of time such as brushing hair.  Described as tension or stress in the palm of the hand which extends to the associated finger each time.  No associated numbness or tingling of the fingers or deformity noted.  Advised patient to modify behavior to prevent repetitive action that leads to the symptoms.  Recommended routine stretching and strengthening of the hand with exercise balls.  Recommended staying hydrated.  Advised patient to notify us if this does not improve or worsens.

## 2019-08-15 ENCOUNTER — Telehealth: Payer: Self-pay | Admitting: Internal Medicine

## 2019-08-15 LAB — BMP8+ANION GAP
Anion Gap: 16 mmol/L (ref 10.0–18.0)
BUN/Creatinine Ratio: 8 — ABNORMAL LOW (ref 9–23)
BUN: 7 mg/dL (ref 6–24)
CO2: 22 mmol/L (ref 20–29)
Calcium: 9.7 mg/dL (ref 8.7–10.2)
Chloride: 107 mmol/L — ABNORMAL HIGH (ref 96–106)
Creatinine, Ser: 0.83 mg/dL (ref 0.57–1.00)
GFR calc Af Amer: 98 mL/min/{1.73_m2} (ref >59)
GFR calc non Af Amer: 85 mL/min/{1.73_m2} (ref >59)
Glucose: 91 mg/dL (ref 65–99)
Potassium: 4.1 mmol/L (ref 3.5–5.2)
Sodium: 145 mmol/L — ABNORMAL HIGH (ref 134–144)

## 2019-08-15 NOTE — Progress Notes (Signed)
Internal Medicine Clinic Attending  Case discussed with Dr. Harbrecht at the time of the visit.  We reviewed the resident's history and exam and pertinent patient test results.  I agree with the assessment, diagnosis, and plan of care documented in the resident's note.   

## 2019-08-15 NOTE — Telephone Encounter (Signed)
I attempted to contact [name] [name] today x2 to discuss her unremarkable lab results.  There is no answer and I did not leave a voicemail. Her lab results are unremarkable. We will address these at at her next visit.

## 2019-08-22 ENCOUNTER — Other Ambulatory Visit: Payer: Self-pay | Admitting: Internal Medicine

## 2019-08-22 DIAGNOSIS — M5416 Radiculopathy, lumbar region: Secondary | ICD-10-CM

## 2019-08-22 DIAGNOSIS — M25561 Pain in right knee: Secondary | ICD-10-CM

## 2019-08-25 MED ORDER — CYCLOBENZAPRINE HCL 5 MG PO TABS: ORAL_TABLET | ORAL | 0 refills | Status: DC

## 2019-09-29 ENCOUNTER — Telehealth: Payer: Self-pay | Admitting: *Deleted

## 2019-09-29 ENCOUNTER — Encounter: Payer: Self-pay | Admitting: *Deleted

## 2019-09-29 NOTE — Telephone Encounter (Signed)
Appointment Request From: [name] [name]  With Provider: Lanelle Bal, MD [facility]]  Preferred Date Range: 09/26/2019 - 10/24/2019  Preferred Times: Monday, Tuesday, Wednesday, Thursday, Friday9:00 AM - 5:00 PM  Reason for visit: Request an Appointment  Comments: want to be checked for tuberculosis just found out a family member may have it   Message sent to PCP.

## 2019-09-29 NOTE — Telephone Encounter (Signed)
Called pt - no answer; left message to call the office . 

## 2019-09-29 NOTE — Telephone Encounter (Signed)
Called pt - unable to talk;she's in a store. Left message to call the office.

## 2019-09-29 NOTE — Telephone Encounter (Signed)
Per Dr Crista Elliot She may need prophylaxis based on exposure risk as well as referral and contact with the Health Department. They will need to determine if her contact was positive etc. She will need to quarantine herself from others particularly the elderly, immunocompromised and young children.  A telehealth appointment should be set up to determine risk based on exposure time frame etc.  Such information as the following per [employer]:  Birth and/or travel to a TB-endemic region of the world  Contact with known, infectious TB cases  History of prior positive tuberculin skin test (TST) or interferon-gamma release assay (IGRA)  Cough of greater than 2 to 3 weeks' duration, with at least one additional symptom, including fever, night sweats, weight loss, or hemoptysis  HIV infection and unexplained cough and fever  Unexplained illness including respiratory symptoms of greater than 2 to 3 weeks' duration in the setting of increased risk for TB (as summarized below)  Community-acquired pneumonia that has not improved after seven days of treatment in the setting of increased risk for TB (as summarized below)  Incidental findings on chest radiography suggestive of TB in the setting of increased risk for TB (as summarized below), even in absence of symptoms  Factors associated with increased risk for TB infection include:  Recent exposure to a person with a case of infectious TB  History of a positive test result for M. tuberculosis infection  Illicit drug use  Birth in or travel to a region where TB incidence is high  Residents and employees of high-risk congregate settings such as homeless shelters and prisons  Membership in a medically underserved, low-income population with high rates of TB  Medical risk factors for progression to active TB include:  HIV infection  Diabetes mellitus  Immunosuppression (including biologic agents such as tumor necrosis factor-alpha inhibitors)  Chronic renal  failure  Hematologic malignancy  Head/neck cancer  Weight greater than 10 percent below ideal body weight  Silicosis  Gastrectomy or jejunoileal bypass

## 2019-10-01 ENCOUNTER — Other Ambulatory Visit: Payer: Self-pay | Admitting: Internal Medicine

## 2019-10-01 ENCOUNTER — Other Ambulatory Visit: Payer: Self-pay | Admitting: Student in an Organized Health Care Education/Training Program

## 2019-10-01 DIAGNOSIS — H16223 Keratoconjunctivitis sicca, not specified as Sjogren's, bilateral: Secondary | ICD-10-CM | POA: Diagnosis not present

## 2019-10-01 DIAGNOSIS — H40033 Anatomical narrow angle, bilateral: Secondary | ICD-10-CM | POA: Diagnosis not present

## 2019-10-01 DIAGNOSIS — M5416 Radiculopathy, lumbar region: Secondary | ICD-10-CM

## 2019-10-02 NOTE — Telephone Encounter (Signed)
I would recommend exercise such as walking regularly, gently stretching of her lower back, abdominal strengthening exercises and NSAID used judiciously.

## 2019-10-03 MED ORDER — DULOXETINE HCL 60 MG PO CPEP: 60.0000 mg | ORAL_CAPSULE | Freq: Every day | ORAL | 0 refills | Status: DC

## 2019-10-03 MED ORDER — CYCLOBENZAPRINE HCL 5 MG PO TABS: ORAL_TABLET | ORAL | 0 refills | Status: DC

## 2019-10-04 DIAGNOSIS — G44209 Tension-type headache, unspecified, not intractable: Secondary | ICD-10-CM | POA: Diagnosis not present

## 2019-10-20 ENCOUNTER — Encounter: Payer: Self-pay | Admitting: Internal Medicine

## 2019-10-22 ENCOUNTER — Ambulatory Visit (INDEPENDENT_AMBULATORY_CARE_PROVIDER_SITE_OTHER): Payer: Medicaid Other | Admitting: Internal Medicine

## 2019-10-22 DIAGNOSIS — M5412 Radiculopathy, cervical region: Secondary | ICD-10-CM | POA: Diagnosis not present

## 2019-10-22 DIAGNOSIS — G5602 Carpal tunnel syndrome, left upper limb: Secondary | ICD-10-CM | POA: Diagnosis not present

## 2019-10-22 DIAGNOSIS — M25532 Pain in left wrist: Secondary | ICD-10-CM

## 2019-10-22 NOTE — Assessment & Plan Note (Signed)
Patient presents with left wrist pain.

## 2019-10-22 NOTE — Patient Instructions (Signed)
Carpal Tunnel Syndrome  Carpal tunnel syndrome is a condition that causes pain in your hand and arm. The carpal tunnel is a narrow area located on the palm side of your wrist. Repeated wrist motion or certain diseases may cause swelling within the tunnel. This swelling pinches the main nerve in the wrist (median nerve). What are the causes? This condition may be caused by:  Repeated wrist motions.  Wrist injuries.  Arthritis.  A cyst or tumor in the carpal tunnel.  Fluid buildup during pregnancy. Sometimes the cause of this condition is not known. What increases the risk? The following factors may make you more likely to develop this condition:  Having a job, such as being a butcher or a cashier, that requires you to repeatedly move your wrist in the same motion.  Being a woman.  Having certain conditions, such as: ? Diabetes. ? Obesity. ? An underactive thyroid (hypothyroidism). ? Kidney failure. What are the signs or symptoms? Symptoms of this condition include:  A tingling feeling in your fingers, especially in your thumb, index, and middle fingers.  Tingling or numbness in your hand.  An aching feeling in your entire arm, especially when your wrist and elbow are bent for a long time.  Wrist pain that goes up your arm to your shoulder.  Pain that goes down into your palm or fingers.  A weak feeling in your hands. You may have trouble grabbing and holding items. Your symptoms may feel worse during the night. How is this diagnosed? This condition is diagnosed with a medical history and physical exam. You may also have tests, including:  Electromyogram (EMG). This test measures electrical signals sent by your nerves into the muscles.  Nerve conduction study. This test measures how well electrical signals pass through your nerves.  Imaging tests, such as X-rays, ultrasound, and MRI. These tests check for possible causes of your condition. How is this treated? This  condition may be treated with:  Lifestyle changes. It is important to stop or change the activity that caused your condition.  Doing exercise and activities to strengthen your muscles and bones (physical therapy).  Learning how to use your hand again after diagnosis (occupational therapy).  Medicines for pain and inflammation. This may include medicine that is injected into your wrist.  A wrist splint.  Surgery. Follow these instructions at home: If you have a splint:  Wear the splint as told by your health care provider. Remove it only as told by your health care provider.  Loosen the splint if your fingers tingle, become numb, or turn cold and blue.  Keep the splint clean.  If the splint is not waterproof: ? Do not let it get wet. ? Cover it with a watertight covering when you take a bath or shower. Managing pain, stiffness, and swelling   If directed, put ice on the painful area: ? If you have a removable splint, remove it as told by your health care provider. ? Put ice in a plastic bag. ? Place a towel between your skin and the bag. ? Leave the ice on for 20 minutes, 2-3 times per day. General instructions  Take over-the-counter and prescription medicines only as told by your health care provider.  Rest your wrist from any activity that may be causing your pain. If your condition is work related, talk with your employer about changes that can be made, such as getting a wrist pad to use while typing.  Do any exercises as told   by your health care provider, physical therapist, or occupational therapist.  Keep all follow-up visits as told by your health care provider. This is important. Contact a health care provider if:  You have new symptoms.  Your pain is not controlled with medicines.  Your symptoms get worse. Get help right away if:  You have severe numbness or tingling in your wrist or hand. Summary  Carpal tunnel syndrome is a condition that causes pain in  your hand and arm.  It is usually caused by repeated wrist motions.  Lifestyle changes and medicines are used to treat carpal tunnel syndrome. Surgery may be recommended.  Follow your health care provider's instructions about wearing a splint, resting from activity, keeping follow-up visits, and calling for help. This information is not intended to replace advice given to you by your health care provider. Make sure you discuss any questions you have with your health care provider. Document Revised: 01/18/2018 Document Reviewed: 01/18/2018 Elsevier Patient Education  2020 [employer]  

## 2019-10-22 NOTE — Progress Notes (Signed)
   CC: left wrist pain  HPI:  [name] is a 49 y.o. female who presents for evaluation of left wrist pain.  Please see problem based assessment and plan for additional details.   Past Medical History:  Diagnosis Date  . Anemia   . Anxiety   . Arthritis    "left knee"  . Chronic lower back pain    "on the left side"  . Headache(784.0) 02/29/12   "frequent"  . Hypertension   . Left knee pain 11/04/2015  . Migraines   . Pneumonia 2006  . Renal insufficiency 06/18/2019  . Sleep apnea    CPAP  . Stress at home    "and at work"    Review of Systems:  Review of Systems - Musculoskeletal ROS: positive for - muscle pain and muscular weakness Neurological ROS: positive for - numbness/tingling   Physical Exam:  Vitals:   10/22/19 1426  BP: (!) 138/95  Pulse: 86  Temp: 98.2 F (36.8 C)  TempSrc: Oral  SpO2: 99%  Weight: 260 lb 3.2 oz (118 kg)  Height: 5\' 3"  (1.6 m)    GENERAL: well appearing, in no apparent distress SKIN: no rash or lesion on limited exam MSK: diminished left hand grip strength but 5/5. Remainder of left arm and neck exam unremarkable. NEURO: sensation of the left arm/hand intact. +phalen's and tinnels signs on left. Pain/parasthesias not reproducible with neck movement.   Assessment & Plan:   1. Left wrist pain. Patient presents to the clinic today with 2w history of left wrist pain accompanied by occassional numbness/tingling in the hand and hand weakness. Works at and wraps a lot of sandwiches. +phalens and tinels.  Assessment: likely carpal tunnel syndrome. She does have cervical radiculopathy however the symptoms are not exacerbated by neck movement and history/PE less consistent with this.  Plan: wrist splinting. Reduce repetitive movement with that hand. F/u if symptoms do not improve at which time we can consider a hand surgeon referral and/or do further investigation into the cervical radiculopathy.  Patient discussed with Dr.  Merrill Lynch, MD Internal Medicine Resident-PGY1 10/23/19

## 2019-10-23 DIAGNOSIS — G5600 Carpal tunnel syndrome, unspecified upper limb: Secondary | ICD-10-CM | POA: Insufficient documentation

## 2019-10-23 NOTE — Assessment & Plan Note (Signed)
Patient presents to the clinic today with 2w history of left wrist pain accompanied by occassional numbness/tingling in the hand and hand weakness. 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 +phalens and tinels.  Assessment: likely carpal tunnel syndrome. She does have cervical radiculopathy however the symptoms are not exacerbated by neck movement and history/PE less consistent with this.  Plan: wrist splinting. Reduce repetitive movement with that hand. F/u if symptoms do not improve at which time we can consider a hand surgeon referral and/or do further investigation into the cervical radiculopathy.

## 2019-10-29 NOTE — Progress Notes (Signed)
Internal Medicine Clinic Attending  Case discussed with Dr. Christian at the time of the visit.  We reviewed the resident's history and exam and pertinent patient test results.  I agree with the assessment, diagnosis, and plan of care documented in the resident's note.    

## 2019-11-06 DIAGNOSIS — R7303 Prediabetes: Secondary | ICD-10-CM | POA: Diagnosis not present

## 2019-11-06 DIAGNOSIS — D473 Essential (hemorrhagic) thrombocythemia: Secondary | ICD-10-CM | POA: Diagnosis not present

## 2019-11-06 DIAGNOSIS — E559 Vitamin D deficiency, unspecified: Secondary | ICD-10-CM | POA: Diagnosis not present

## 2019-11-20 DIAGNOSIS — R7303 Prediabetes: Secondary | ICD-10-CM | POA: Diagnosis not present

## 2019-11-20 DIAGNOSIS — E559 Vitamin D deficiency, unspecified: Secondary | ICD-10-CM | POA: Diagnosis not present

## 2019-12-01 DIAGNOSIS — Z01419 Encounter for gynecological examination (general) (routine) without abnormal findings: Secondary | ICD-10-CM | POA: Diagnosis not present

## 2019-12-01 DIAGNOSIS — Z789 Other specified health status: Secondary | ICD-10-CM | POA: Diagnosis not present

## 2019-12-01 DIAGNOSIS — N643 Galactorrhea not associated with childbirth: Secondary | ICD-10-CM | POA: Diagnosis not present

## 2019-12-04 ENCOUNTER — Other Ambulatory Visit: Payer: Self-pay | Admitting: Internal Medicine

## 2019-12-04 ENCOUNTER — Other Ambulatory Visit: Payer: Self-pay | Admitting: Adult Medicine

## 2019-12-04 DIAGNOSIS — Z1231 Encounter for screening mammogram for malignant neoplasm of breast: Secondary | ICD-10-CM

## 2019-12-08 ENCOUNTER — Ambulatory Visit
Admission: RE | Admit: 2019-12-08 | Discharge: 2019-12-08 | Disposition: A | Discharge status: Home / Self Care | Payer: Medicaid Other | Source: Ambulatory Visit | Attending: Adult Medicine | Admitting: Adult Medicine

## 2019-12-08 ENCOUNTER — Other Ambulatory Visit: Payer: Self-pay

## 2019-12-08 ENCOUNTER — Encounter (HOSPITAL_COMMUNITY): Payer: Self-pay

## 2019-12-08 ENCOUNTER — Ambulatory Visit (HOSPITAL_COMMUNITY)
Admission: EM | Admit: 2019-12-08 | Discharge: 2019-12-08 | Disposition: A | Discharge status: Home / Self Care | Payer: Medicaid Other | Attending: Family Medicine | Admitting: Family Medicine

## 2019-12-08 DIAGNOSIS — Z1231 Encounter for screening mammogram for malignant neoplasm of breast: Secondary | ICD-10-CM | POA: Diagnosis not present

## 2019-12-08 DIAGNOSIS — M25562 Pain in left knee: Secondary | ICD-10-CM

## 2019-12-08 DIAGNOSIS — M25552 Pain in left hip: Secondary | ICD-10-CM

## 2019-12-08 MED ORDER — MELOXICAM 7.5 MG PO TABS: 7.5000 mg | ORAL_TABLET | Freq: Every day | ORAL | 0 refills | Status: DC

## 2019-12-08 MED ORDER — KETOROLAC TROMETHAMINE 30 MG/ML IJ SOLN
INTRAMUSCULAR | Status: AC
  Filled 2019-12-08: qty 1

## 2019-12-08 MED ORDER — METHYLPREDNISOLONE SODIUM SUCC 125 MG IJ SOLR
INTRAMUSCULAR | Status: AC
  Filled 2019-12-08: qty 2

## 2019-12-08 MED ORDER — METHYLPREDNISOLONE SODIUM SUCC 125 MG IJ SOLR
125.0000 mg | Freq: Once | INTRAMUSCULAR | Status: AC
  Administered 2019-12-08: 125 mg via INTRAMUSCULAR

## 2019-12-08 MED ORDER — KETOROLAC TROMETHAMINE 30 MG/ML IJ SOLN
30.0000 mg | Freq: Once | INTRAMUSCULAR | Status: AC
  Administered 2019-12-08: 20:00:00 30 mg via INTRAMUSCULAR

## 2019-12-08 NOTE — ED Provider Notes (Signed)
[facility]    CSN: [id number] Arrival date & time: 12/08/19  1759      History   Chief Complaint Chief Complaint  Patient presents with  . Leg Pain    left    HPI [name] [name] is a 49 y.o. female.   HPI  Patient present with a complaint of left leg pain. Medical history significant for left knee arthritis. She is s/p right knee ligament repair and endorses favoring left extremity during recovery. Patient works at [employer] and endorses prolonged standing at work and attributes this to current level of pain. Denies any known injury or recent fall. Current pain originates behind the left knee and radiates up to left thigh. Standing and changes in positioning increases level of pain. She is chronically prescribed Gabapentin and cyclobenzaprine for management of MSK pain. She has been taking as prescribed, however, medication is not improving current level of pain. Previously prescribed chronic anti-inflammatory, recently told by PCP to discontinue. Pt is uncertain of cause. Per EMR last renal function normal and patient denies GI distress or bleed. Past Medical History:  Diagnosis Date  . Anemia   . Anxiety   . Arthritis    "left knee"  . Chronic lower back pain    "on the left side"  . Headache(784.0) 02/29/12   "frequent"  . Hypertension   . Left knee pain 11/04/2015  . Migraines   . Pneumonia 2006  . Renal insufficiency 06/18/2019  . Sleep apnea    CPAP  . Stress at home    "and at work"    Patient Active Problem List   Diagnosis Date Noted  . Carpal tunnel syndrome 10/23/2019  . Trigger finger, right 08/14/2019  . 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 Pneumonia due to COVID-19 virus 06/18/2019  . Acute respiratory failure with hypoxia ([place]) 06/18/2019  . Renal insufficiency 06/18/2019  . Hyperkalemia 10/30/2018  . Posterior knee pain, right 02/21/2018  . Essential hypertension 01/30/2018  . Mass on back 01/30/2018  . Cervical radiculopathy 04/30/2017    . Left wrist pain 11/28/2016  . Pre-diabetes 02/16/2016  . OSA (obstructive sleep apnea) 05/14/2015  . Chronic lumbar radiculopathy 11/03/2014  . Other allergic rhinitis 09/07/2014  . Health care maintenance 09/07/2014  . OBESITY, MORBID 09/20/2006  . Anemia, iron deficiency 08/09/2006    Past Surgical History:  Procedure Laterality Date  . BREAST BIOPSY Left 07/29/2013  . CESAREAN SECTION  08/2008  . CHOLECYSTECTOMY  03/02/2012   Procedure: LAPAROSCOPIC CHOLECYSTECTOMY;  Surgeon: Gwenyth Ober, MD;  Location: [facility];  Service: General;;  . KNEE ARTHROSCOPY WITH MENISCAL REPAIR Right 06/25/2018   Procedure: RIGHT KNEE ARTHROSCOPY WITH MENISCAL ROOT REPAIR;  Surgeon: Meredith Pel, MD;  Location: [facility];  Service: Orthopedics;  Laterality: Right;  . WISDOM TOOTH EXTRACTION      OB History   No obstetric history on file.      Home Medications    Prior to Admission medications   Medication Sig Start Date End Date Taking? Authorizing Provider  albuterol (PROVENTIL HFA) 108 (90 Base) MCG/ACT inhaler Inhale 1-2 puffs into the lungs every 6 (six) hours as needed for wheezing or shortness of breath. 07/17/19  Yes Al Decant, MD  cyclobenzaprine (FLEXERIL) 5 MG tablet Take 1 tablet by mouth daily for muscle spasm 10/03/19  Yes Kathi Ludwig, MD  DULoxetine (CYMBALTA) 60 MG capsule Take 1 capsule (60 mg total) by mouth daily. 10/03/19  Yes Kathi Ludwig, MD  fluticasone [facility]enc[facility])  50 MCG/ACT nasal spray Place 2 sprays into both nostrils daily. 11/13/18  Yes Joy, Shawn C, PA-C  gabapentin (NEURONTIN) 300 MG capsule Take 1-3 capsules (300-900 mg total) by mouth See admin instructions. Take 1 capsule every morning and take 3 capsules at bedtime 08/14/19  Yes Harbrecht, Lyman Bishop, MD  lisinopril (PRINIVIL,ZESTRIL) 10 MG tablet Take 1 tablet (10 mg total) by mouth daily. 10/30/18 12/08/19 Yes Lanelle Bal, MD  benzonatate (TESSALON) 100 MG capsule Take 1 capsule (100 mg total)  by mouth every 8 (eight) hours. Patient not taking: Reported on 06/18/2019 11/13/18   Joy, Shawn C, PA-C  ondansetron (ZOFRAN ODT) 4 MG disintegrating tablet Take 1 tablet (4 mg total) by mouth every 8 (eight) hours as needed for nausea or vomiting. Patient not taking: Reported on 06/18/2019 11/13/18   Anselm Pancoast, PA-C    Family History Family History  Problem Relation Age of Onset  . Cancer Other     Social History Social History   Tobacco Use  . Smoking status: Never Smoker  . Smokeless tobacco: Never Used  Substance Use Topics  . Alcohol use: No    Alcohol/week: 0.0 standard drinks  . Drug use: No     Allergies   Mushroom extract complex   Review of Systems Review of Systems Pertinent negatives listed in HPI Physical Exam Triage Vital Signs ED Triage Vitals  Enc Vitals Group     BP 12/08/19 1856 131/84     Pulse Rate 12/08/19 1856 94     Resp 12/08/19 1856 18     Temp 12/08/19 1856 98.3 F (36.8 C)     Temp Source 12/08/19 1856 Oral     SpO2 12/08/19 1856 99 %     Weight 12/08/19 1854 260 lb (117.9 kg)     Height 12/08/19 1854 5\' 2"  (1.575 m)     Head Circumference --      Peak Flow --      Pain Score 12/08/19 1854 10     Pain Loc --      Pain Edu? --      Excl. in GC? --    No data found.  Updated Vital Signs BP 131/84 (BP Location: Left Arm)   Pulse 94   Temp 98.3 F (36.8 C) (Oral)   Resp 18   Ht 5\' 2"  (1.575 m)   Wt 260 lb (117.9 kg)   SpO2 99%   BMI 47.55 kg/m   Visual Acuity Right Eye Distance:   Left Eye Distance:   Bilateral Distance:    Right Eye Near:   Left Eye Near:    Bilateral Near:     Physical Exam Vitals reviewed.  Constitutional:      Appearance: Normal appearance. She is obese.  Cardiovascular:     Rate and Rhythm: Normal rate and regular rhythm.  Pulmonary:     Effort: Pulmonary effort is normal.     Breath sounds: Normal breath sounds.  Musculoskeletal:     Left hip: Tenderness present.     Left knee: Swelling  and bony tenderness present.     Comments: Test and minimal visibility of the entire knee r/t restrictive clothing patent is wearing.  Neurological:     Mental Status: She is alert and oriented to person, place, and time.  Psychiatric:        Attention and Perception: Attention normal.        Mood and Affect: Mood normal.      UC Treatments /  Results  Labs (all labs ordered are listed, but only abnormal results are displayed) Labs Reviewed - No data to display  EKG   Radiology  Procedures Procedures (including critical care time)  Medications Ordered in UC Medications - No data to display  Initial Impression / Assessment and Plan / UC Course  I have reviewed the triage vital signs and the nursing notes.  Pertinent labs & imaging results that were available during my care of the patient were reviewed by me and considered in my medical decision making (see chart for details).      1. Acute pain of left knee, known history of arthritis involving the left knee. -Deferred imaging today given no known injury. -Trial Toradol 30 mg IM and Solumedrol 125 mg IM in clinic today -Start PRN Meloxicam tomorrow -RICE -Work not provide x 1 day off to allow for rest and elevation -Encouraged follow-up with orthopedics if no improvement with current treatment  2. Left hip pain -see plan #1 Final Clinical Impressions(s) / UC Diagnoses   Final diagnoses:  Acute pain of left knee  Left hip pain   Discharge Instructions   None    ED Prescriptions    Medication Sig Dispense Auth. Provider   meloxicam (MOBIC) 7.5 MG tablet Take 1 tablet (7.5 mg total) by mouth daily. 10 tablet Bing Neighbors, FNP     PDMP not reviewed this encounter.   Bing Neighbors, [place] 12/09/19 [phone]

## 2019-12-08 NOTE — ED Triage Notes (Signed)
Patient complains of left leg pain x 1 month. States that she has been having pain, sometimes with pain in left hip. Denies any known injury.

## 2019-12-13 ENCOUNTER — Other Ambulatory Visit: Payer: Self-pay | Admitting: Internal Medicine

## 2019-12-13 ENCOUNTER — Other Ambulatory Visit: Payer: Self-pay | Admitting: Family Medicine

## 2019-12-13 DIAGNOSIS — I10 Essential (primary) hypertension: Secondary | ICD-10-CM

## 2019-12-13 DIAGNOSIS — M5416 Radiculopathy, lumbar region: Secondary | ICD-10-CM

## 2019-12-15 DIAGNOSIS — E669 Obesity, unspecified: Secondary | ICD-10-CM | POA: Diagnosis not present

## 2019-12-15 DIAGNOSIS — N643 Galactorrhea not associated with childbirth: Secondary | ICD-10-CM | POA: Diagnosis not present

## 2019-12-15 DIAGNOSIS — E663 Overweight: Secondary | ICD-10-CM | POA: Diagnosis not present

## 2019-12-15 DIAGNOSIS — M25562 Pain in left knee: Secondary | ICD-10-CM | POA: Diagnosis not present

## 2019-12-16 MED ORDER — CYCLOBENZAPRINE HCL 5 MG PO TABS: ORAL_TABLET | ORAL | 0 refills | Status: DC

## 2019-12-16 MED ORDER — LISINOPRIL 10 MG PO TABS: 10.0000 mg | ORAL_TABLET | Freq: Every day | ORAL | 3 refills | Status: DC

## 2019-12-28 ENCOUNTER — Emergency Department (HOSPITAL_COMMUNITY)
Admission: EM | Admit: 2019-12-28 | Discharge: 2019-12-28 | Disposition: A | Discharge status: Home / Self Care | Payer: Medicaid Other | Attending: Emergency Medicine | Admitting: Emergency Medicine

## 2019-12-28 ENCOUNTER — Encounter (HOSPITAL_COMMUNITY): Payer: Self-pay

## 2019-12-28 ENCOUNTER — Other Ambulatory Visit: Payer: Self-pay

## 2019-12-28 DIAGNOSIS — Z5321 Procedure and treatment not carried out due to patient leaving prior to being seen by health care provider: Secondary | ICD-10-CM | POA: Diagnosis not present

## 2019-12-28 DIAGNOSIS — M79605 Pain in left leg: Secondary | ICD-10-CM | POA: Diagnosis not present

## 2019-12-28 NOTE — ED Notes (Signed)
Pt left AMA. Pt didn't want to wait any longer. Pt stated she would come back in the morning or to go urgent care. Pt was advised to stay and was aware of risks.

## 2019-12-28 NOTE — ED Triage Notes (Signed)
Onset today climbing up stairs, twisted leg leg, pain on back of calf, knee and thigh.   Difficulty bearing weight on left leg.

## 2019-12-30 ENCOUNTER — Emergency Department (HOSPITAL_COMMUNITY): Payer: Medicaid Other

## 2019-12-30 ENCOUNTER — Encounter (HOSPITAL_COMMUNITY): Payer: Self-pay | Admitting: Emergency Medicine

## 2019-12-30 ENCOUNTER — Other Ambulatory Visit: Payer: Self-pay

## 2019-12-30 ENCOUNTER — Emergency Department (HOSPITAL_COMMUNITY)
Admission: EM | Admit: 2019-12-30 | Discharge: 2019-12-30 | Disposition: A | Discharge status: Home / Self Care | Payer: Medicaid Other | Attending: Emergency Medicine | Admitting: Emergency Medicine

## 2019-12-30 DIAGNOSIS — S83502A Sprain of unspecified cruciate ligament of left knee, initial encounter: Secondary | ICD-10-CM | POA: Insufficient documentation

## 2019-12-30 DIAGNOSIS — Y9389 Activity, other specified: Secondary | ICD-10-CM | POA: Insufficient documentation

## 2019-12-30 DIAGNOSIS — Y9289 Other specified places as the place of occurrence of the external cause: Secondary | ICD-10-CM | POA: Insufficient documentation

## 2019-12-30 DIAGNOSIS — Z79899 Other long term (current) drug therapy: Secondary | ICD-10-CM | POA: Diagnosis not present

## 2019-12-30 DIAGNOSIS — Y999 Unspecified external cause status: Secondary | ICD-10-CM | POA: Insufficient documentation

## 2019-12-30 DIAGNOSIS — X509XXA Other and unspecified overexertion or strenuous movements or postures, initial encounter: Secondary | ICD-10-CM | POA: Insufficient documentation

## 2019-12-30 DIAGNOSIS — Z8616 Personal history of COVID-19: Secondary | ICD-10-CM | POA: Diagnosis not present

## 2019-12-30 DIAGNOSIS — I10 Essential (primary) hypertension: Secondary | ICD-10-CM | POA: Insufficient documentation

## 2019-12-30 DIAGNOSIS — S8992XA Unspecified injury of left lower leg, initial encounter: Secondary | ICD-10-CM | POA: Diagnosis not present

## 2019-12-30 NOTE — Progress Notes (Signed)
Orthopedic Tech Progress Note Patient Details:  [name] [name] January 10, 1971 [id number]  Ortho Devices Type of Ortho Device: Long leg splint, Knee Immobilizer Ortho Device/Splint Location: LLE Ortho Device/Splint Interventions: Ordered, Application, Adjustment   Post Interventions Patient Tolerated: Well, Fair Instructions Provided: Care of device, Adjustment of device, Poper ambulation with device   Areta Terwilliger N Lennette Fader 12/30/2019, 1:37 PM

## 2019-12-30 NOTE — ED Provider Notes (Signed)
[facility]-EMERGENCY DEPT Provider Note   CSN: [id number] Arrival date & time: 12/30/19  1135     History Chief Complaint  Patient presents with  . Knee Pain    [name] [name] is a 49 y.o. female.  The history is provided by the patient. No language interpreter was used.  Knee Pain Location:  Knee Time since incident:  1 day Injury: no   Knee location:  L knee Pain details:    Quality:  Aching   Radiates to:  Does not radiate   Severity:  Moderate   Onset quality:  Gradual   Duration:  3 days   Timing:  Constant   Progression:  Worsening Chronicity:  New Dislocation: no   Foreign body present:  No foreign bodies Relieved by:  Nothing Worsened by:  Nothing Ineffective treatments:  None tried Risk factors: no known bone disorder   Pt reports she was going up stairs and had a pop in her leg      Past Medical History:  Diagnosis Date  . Anemia   . Anxiety   . Arthritis    "left knee"  . Chronic lower back pain    "on the left side"  . Headache(784.0) 02/29/12   "frequent"  . Hypertension   . Left knee pain 11/04/2015  . Migraines   . Pneumonia 2006  . Renal insufficiency 06/18/2019  . Sleep apnea    CPAP  . Stress at home    "and at work"    Patient Active Problem List   Diagnosis Date Noted  . Carpal tunnel syndrome 10/23/2019  . Trigger finger, right 08/14/2019  . 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 Pneumonia due to COVID-19 virus 06/18/2019  . Acute respiratory failure with hypoxia ([facility]) 06/18/2019  . Renal insufficiency 06/18/2019  . Hyperkalemia 10/30/2018  . Posterior knee pain, right 02/21/2018  . Essential hypertension 01/30/2018  . Mass on back 01/30/2018  . Cervical radiculopathy 04/30/2017  . Left wrist pain 11/28/2016  . Pre-diabetes 02/16/2016  . OSA (obstructive sleep apnea) 05/14/2015  . Chronic lumbar radiculopathy 11/03/2014  . Other allergic rhinitis 09/07/2014  . Health care maintenance 09/07/2014  .  OBESITY, MORBID 09/20/2006  . Anemia, iron deficiency 08/09/2006    Past Surgical History:  Procedure Laterality Date  . BREAST BIOPSY Left 07/29/2013  . CESAREAN SECTION  08/2008  . CHOLECYSTECTOMY  03/02/2012   Procedure: LAPAROSCOPIC CHOLECYSTECTOMY;  Surgeon: Cherylynn Ridges, MD;  Location: [facility] OR;  Service: General;;  . KNEE ARTHROSCOPY WITH MENISCAL REPAIR Right 06/25/2018   Procedure: RIGHT KNEE ARTHROSCOPY WITH MENISCAL ROOT REPAIR;  Surgeon: Cammy Copa, MD;  Location: [facility] OR;  Service: Orthopedics;  Laterality: Right;  . WISDOM TOOTH EXTRACTION       OB History   No obstetric history on file.     Family History  Problem Relation Age of Onset  . Cancer Other     Social History   Tobacco Use  . Smoking status: Never Smoker  . Smokeless tobacco: Never Used  Substance Use Topics  . Alcohol use: No    Alcohol/week: 0.0 standard drinks  . Drug use: No    Home Medications Prior to Admission medications   Medication Sig Start Date End Date Taking? Authorizing Provider  albuterol (PROVENTIL HFA) 108 (90 Base) MCG/ACT inhaler Inhale 1-2 puffs into the lungs every 6 (six) hours as needed for wheezing or shortness of breath. 07/17/19   Jenell Milliner, MD  benzonatate [facility])  100 MG capsule Take 1 capsule (100 mg total) by mouth every 8 (eight) hours. Patient not taking: Reported on 06/18/2019 11/13/18   Anselm Pancoast, PA-C  cyclobenzaprine (FLEXERIL) 5 MG tablet Take 1 tablet by mouth daily for muscle spasm 12/16/19   Lanelle Bal, MD  DULoxetine (CYMBALTA) 60 MG capsule Take 1 capsule (60 mg total) by mouth daily. 10/03/19   Lanelle Bal, MD  fluticasone (FLONASE) 50 MCG/ACT nasal spray Place 2 sprays into both nostrils daily. 11/13/18   Joy, Shawn C, PA-C  gabapentin (NEURONTIN) 300 MG capsule Take 1-3 capsules (300-900 mg total) by mouth See admin instructions. Take 1 capsule every morning and take 3 capsules at bedtime 08/14/19   Lanelle Bal, MD    lisinopril (ZESTRIL) 10 MG tablet Take 1 tablet (10 mg total) by mouth daily. 12/16/19 12/15/20  Lanelle Bal, MD  meloxicam (MOBIC) 7.5 MG tablet Take 1 tablet (7.5 mg total) by mouth daily. 12/08/19   Bing Neighbors, FNP  ondansetron (ZOFRAN ODT) 4 MG disintegrating tablet Take 1 tablet (4 mg total) by mouth every 8 (eight) hours as needed for nausea or vomiting. Patient not taking: Reported on 06/18/2019 11/13/18   Anselm Pancoast, PA-C    Allergies    Mushroom extract complex  Review of Systems   Review of Systems  Musculoskeletal: Positive for joint swelling and myalgias.  All other systems reviewed and are negative.   Physical Exam Updated Vital Signs BP (!) 133/97   Pulse 85   Temp 98.5 F (36.9 C)   Resp 16   SpO2 98%   Physical Exam Vitals reviewed.  Musculoskeletal:        General: Swelling and tenderness present.     Comments: Tender posterior knee, pain with range of motion, nv and ns intact  No medical or lateral instability negative drawer.   Skin:    General: Skin is warm.  Neurological:     General: No focal deficit present.  Psychiatric:        Mood and Affect: Mood normal.     ED Results / Procedures / Treatments   Labs (all labs ordered are listed, but only abnormal results are displayed) Labs Reviewed - No data to display  EKG None  Radiology DG Knee Complete 4 Views Left  Result Date: 12/30/2019 CLINICAL DATA:  Left knee injury. EXAM: LEFT KNEE - COMPLETE 4+ VIEW COMPARISON:  No recent. FINDINGS: Mild tricompartment degenerative change. No acute bony abnormality identified. No evidence of fracture. No evidence of dislocation. IMPRESSION: Mild tricompartment degenerative change. Electronically Signed   By: Maisie Fus  Register   On: 12/30/2019 12:07    Procedures Procedures (including critical care time)  Medications Ordered in ED Medications - No data to display  ED Course  I have reviewed the triage vital signs and the nursing  notes.  Pertinent labs & imaging results that were available during my care of the patient were reviewed by me and considered in my medical decision making (see chart for details).    MDM Rules/Calculators/A&P                      MDM:  I suspect menicus injury,  Xray shows arthritis.  Pt placed in knee imbolizer and advised to follow up with Dr. Ophelia Charter for recheck.  Final Clinical Impression(s) / ED Diagnoses Final diagnoses:  Sprain of cruciate ligament of left knee, initial encounter    Rx / DC Orders ED Discharge Orders  None    An After Visit Summary was printed and given to the patient.    Fransico Meadow, [place] 12/30/19 1304    Truddie Hidden, MD 12/30/19 [phone]

## 2019-12-30 NOTE — Discharge Instructions (Signed)
Return if any problems.

## 2019-12-30 NOTE — ED Triage Notes (Signed)
Patient reports right knee pain after walking up stairs x3 days ago. Reports pain is in posterior knee. Pain worsens with weight bearing.

## 2020-01-14 ENCOUNTER — Other Ambulatory Visit: Payer: Self-pay

## 2020-01-14 ENCOUNTER — Ambulatory Visit: Payer: Medicaid Other | Admitting: Internal Medicine

## 2020-01-14 ENCOUNTER — Encounter: Payer: Self-pay | Admitting: Internal Medicine

## 2020-01-14 VITALS — BP 125/72 | HR 88 | Temp 98.9°F | Ht 62.0 in | Wt 264.3 lb

## 2020-01-14 DIAGNOSIS — S8992XD Unspecified injury of left lower leg, subsequent encounter: Secondary | ICD-10-CM | POA: Insufficient documentation

## 2020-01-14 DIAGNOSIS — Z79899 Other long term (current) drug therapy: Secondary | ICD-10-CM | POA: Diagnosis not present

## 2020-01-14 DIAGNOSIS — M25562 Pain in left knee: Secondary | ICD-10-CM | POA: Diagnosis not present

## 2020-01-14 DIAGNOSIS — Z6841 Body Mass Index (BMI) 40.0 and over, adult: Secondary | ICD-10-CM | POA: Diagnosis not present

## 2020-01-14 DIAGNOSIS — I10 Essential (primary) hypertension: Secondary | ICD-10-CM | POA: Diagnosis not present

## 2020-01-14 MED ORDER — SAXENDA 18 MG/3ML ~~LOC~~ SOPN: PEN_INJECTOR | SUBCUTANEOUS | 2 refills | Status: DC

## 2020-01-14 NOTE — Assessment & Plan Note (Signed)
Class 3 obesity: Given the patient's recurrent knee injuries, osteoarthritis on plain films and hypertension she has multiple comorbidities related to obesity.  I feel that weight loss is most assuredly necessary step to prevent progression of her comorbid conditions and development of other conditions such as diabetes or metabolic syndrome.  patient described herself as an emotional eater or someone who eats when bored or in pain.  We again discussed dietary modifications and exercise the latter of which is currently limited by her knee injury.  I feel that she would likely benefit from cognitive behavioral therapy regarding her dietary concerns no weight gain.  Plan: In addition to diet exercise we will try Saxenda although I doubt insurance will cover this.  The next step will be to refer for possible surgical evaluation is given her BMI did not think the medication alone will be sufficient.  I have prescribed Saxenda with the following escalation plan per [employer]: Initial: 0.6 mg once daily for 1 week; increase by 0.6 mg daily at weekly intervals to a target dose of 3 mg once daily. If the patient cannot tolerate an increased dose during dose escalation, consider delaying dose escalation for 1 additional week.

## 2020-01-14 NOTE — Progress Notes (Addendum)
   CC: HTN  HPI:[name] [name] is a 49 y.o. female who presents for evaluation of HTN. Please see individual problem based A/P for details.  Health Maintenance History: Pap Smear completed at [facility] medical, no records present.  Immunization History: Up to date.  Patient stated that she has received the Covid vaccination but does not have documentation with her today.  She will provide this at a follow-up visit.  Depression, PHQ-9: Based on the patients    Office Visit from 01/14/2020 in [facility]  PHQ-9 Total Score  4     score we have decided to monitor.  Past Medical History:  Diagnosis Date  . Anemia   . Anxiety   . Arthritis    "left knee"  . Chronic lower back pain    "on the left side"  . Headache(784.0) 02/29/12   "frequent"  . Hypertension   . Left knee pain 11/04/2015  . Migraines   . Pneumonia 2006  . Renal insufficiency 06/18/2019  . Sleep apnea    CPAP  . Stress at home    "and at work"   Review of Systems:  ROS negative except as per HPI.  Physical Exam: Vitals:   01/14/20 1339 01/14/20 1340  BP:  125/72  Pulse:  88  Temp:  98.9 F (37.2 C)  TempSrc:  Oral  SpO2:  98%  Weight: 264 lb 4.8 oz (119.9 kg)   Height: 5\' 2"  (1.575 m)    Filed Weights   01/14/20 1339  Weight: 264 lb 4.8 oz (119.9 kg)   General: A/O x4, in no acute distress, afebrile, nondiaphoretic HEENT: PEERL, EMO intact Cardio: RRR, no mrg's  Pulmonary: CTA bilaterally,  no wheezing or crackles  Abdomen: Bowel sounds normal, soft, nontender  MSK: Bilateral distal lower extremities are nontender and nonedematous. Left knee: Exam is limited by body habitus.  There is no appreciable varus or valgus laxity, anterior and posterior drawer are negative, there is pain on palpation the lateral/anterior aspect of the left knee, there is marked crepitus with full extension of the knee.  There is a locking sensation with full extension of the knee.  There is pain  with flexion and extension of the knee.  Strength is 5 out of 5 in the left and right lower extremities comparably.  Sensation and vasculature are intact distally. Psych: Appropriate affect, not depressed in appearance, engages well  Assessment & Plan:   See Encounters Tab for problem based charting.  Patient discussed with Dr. 01/16/20

## 2020-01-14 NOTE — Assessment & Plan Note (Signed)
Hypertension: Patient's BP today is 125/72 with a goal of <140/80. The patient endorses adherence to her medication regimen. She denied, chest pain, headache, visual changes, lightheadedness, weakness, dizziness on standing, swelling in the feet or ankles.   Plan: Continue lisinopril 10mg  daily BMP today

## 2020-01-14 NOTE — Patient Instructions (Addendum)
FOLLOW-UP INSTRUCTIONS When: 3-4 months For: Routine visit What to bring: All of your medications  I have not made any changes to your medications today.   Please make certain to call the orthopedic physician who completed your last surgery to schedule with them as soon as possible. In the meantime, you may use up to 800mg  of ibuprofen no more than three times per day for 7-10 days. Please make certain to stay hydrated while doing this.   For your weight, we will try to prescribe a medication to help with weight loss.  Most importantly, please implement the changes that we discussed during your visit.  I recommend swimming or water aerobics while you have knee pain. I recommend consuming at least 2 servings of minimally cooked vegetables with lunch and your evening meal.  Please decrease the quantity of sodas and red meat that you consume.   In general things that are minimally processed such as fruits, vegetables, nuts, oats, and lean meats such as chicken and fish are best. The medication I am prescribing is called Saxenda.  You will begin taking this at 0.6 mg daily for 7 days straight.  After 7 days you may increase this by 0.6 mg (up to 1.2 mg) for the next 7 days.  Every 7 days you may increase this by 0.6 mg as long as you tolerate this up to a maximum dose of 3 mg every day.  This will take at least 5 weeks.  Please do call with any questions or concerns.  For your high blood pressure please continue to take lisinopril 10 mg daily.  Thank you for your visit to the [facility] today. If you have any questions or concerns please call [facility] at [phone].

## 2020-01-14 NOTE — Progress Notes (Signed)
Internal Medicine Clinic Attending  Case discussed with Dr. Harbrecht at the time of the visit.  We reviewed the resident's history and exam and pertinent patient test results.  I agree with the assessment, diagnosis, and plan of care documented in the resident's note.   

## 2020-01-14 NOTE — Assessment & Plan Note (Signed)
Left knee pain: Sharp left knee pain of sudden onset while walking up stairs, no traumatic event.  There is a popping/clicking sensation that occurs. Pain is 7/10.  Pain has not improved since symptoms began April 3.  This 18 days of pain is unimproved with intermittent NSAID use or knee stabilization brace. Based on her exam revealing crepitus, no marked edema (although due to body habitus this is difficult to ascertain) no observable joint laxity, locking sensation with use and severity of the pain with symptoms being similar to her prior right-sided meniscal tear I feel that a meniscal tear is most likely.  Plan:  I advised the patient to use ibuprofen 800 mg up to 3 times daily for no more than 7 to 10 days She is to call orthopedic surgery and schedule with them as soon as possible She is to return or call us if her pain has not improved or if she is not able to get in with orthopedics

## 2020-01-15 LAB — BMP8+ANION GAP
Anion Gap: 13 mmol/L (ref 10.0–18.0)
BUN/Creatinine Ratio: 9 (ref 9–23)
BUN: 7 mg/dL (ref 6–24)
CO2: 22 mmol/L (ref 20–29)
Calcium: 9.4 mg/dL (ref 8.7–10.2)
Chloride: 106 mmol/L (ref 96–106)
Creatinine, Ser: 0.74 mg/dL (ref 0.57–1.00)
GFR calc Af Amer: 112 mL/min/{1.73_m2} (ref >59)
GFR calc non Af Amer: 97 mL/min/{1.73_m2} (ref >59)
Glucose: 89 mg/dL (ref 65–99)
Potassium: 4.1 mmol/L (ref 3.5–5.2)
Sodium: 141 mmol/L (ref 134–144)

## 2020-01-17 ENCOUNTER — Other Ambulatory Visit: Payer: Self-pay | Admitting: Student in an Organized Health Care Education/Training Program

## 2020-01-17 DIAGNOSIS — M5416 Radiculopathy, lumbar region: Secondary | ICD-10-CM

## 2020-01-20 ENCOUNTER — Encounter: Payer: Self-pay | Admitting: Orthopedic Surgery

## 2020-01-20 ENCOUNTER — Ambulatory Visit: Payer: Medicaid Other | Admitting: Orthopedic Surgery

## 2020-01-20 ENCOUNTER — Telehealth: Payer: Self-pay | Admitting: *Deleted

## 2020-01-20 ENCOUNTER — Other Ambulatory Visit: Payer: Self-pay

## 2020-01-20 ENCOUNTER — Ambulatory Visit (INDEPENDENT_AMBULATORY_CARE_PROVIDER_SITE_OTHER): Payer: Medicaid Other | Admitting: Orthopedic Surgery

## 2020-01-20 DIAGNOSIS — M25562 Pain in left knee: Secondary | ICD-10-CM

## 2020-01-20 NOTE — Telephone Encounter (Signed)
Thank you :)

## 2020-01-20 NOTE — Telephone Encounter (Signed)
Call to [employer] for PA for Saxenda.  Patient's insurance does not cover any medications for weight loss.  Cost of out of pocket is around $1,000.  Message to Dr. Crista Elliot.  Angelina Ok, RN 01/20/2020 9:16 AM.

## 2020-01-21 ENCOUNTER — Encounter: Payer: Self-pay | Admitting: Orthopedic Surgery

## 2020-01-21 NOTE — Progress Notes (Signed)
Office Visit Note   Patient: [name] [name]           Date of Birth: 1971-07-16           MRN: [id number] Visit Date: 01/20/2020 Requested by: Lanelle Bal, MD [address] [place],  [place] [address] PCP: Lanelle Bal, MD  Subjective: Chief Complaint  Patient presents with  . Left Knee - Pain    HPI: [name] [name] is a patient with left knee pain.  She had right knee arthroscopy and meniscal root debridement for partial tear at that time.  She now describes several month history of left knee pain.  She has failed conservative management consisting of activity modifications anti-inflammatories and leg exercise program.  Describes constant pain as well as pain that wakes her from sleep at night.  She describes weakness and giving way as well as medial sided pain.  She is using a cane which is new for her.  She has been taking Mobic but went off that and is now taking Neurontin.  She states that when she tries to walk it will not bend very well.  She does work at [employer] and has been doing so until it has become unbearable.  This pain is worse than the right leg was prior to surgery in October of last year.              ROS: All systems reviewed are negative as they relate to the chief complaint within the history of present illness.  Patient denies  fevers or chills.   Assessment & Plan: Visit Diagnoses:  1. Left knee pain, unspecified chronicity     Plan: Impression is left knee pain with possible meniscal root tear and possible unstable meniscal flap tear.  She is having difficulty with ambulation but this is more with flexion.  Medial side pain is worse the lateral sided pain.  With her spontaneous meniscal root partial avulsion I think MRI scanning is indicated based on failure of conservative management and duration of symptoms with worsening clinical symptoms over the past 3 weeks.  Out of work for 3 weeks.  She was in the emergency department a month ago with the symptoms.   We need to do MRI scan to figure out exactly what is going on with it.  Follow-Up Instructions: Return for after MRI.   Orders:  Orders Placed This Encounter  Procedures  . MR Knee Left w/o contrast   No orders of the defined types were placed in this encounter.     Procedures: No procedures performed   Clinical Data: No additional findings.  Objective: Vital Signs: There were no vitals taken for this visit.  Physical Exam:   Constitutional: Patient appears well-developed HEENT:  Head: Normocephalic Eyes:EOM are normal Neck: Normal range of motion Cardiovascular: Normal rate Pulmonary/chest: Effort normal Neurologic: Patient is alert Skin: Skin is warm Psychiatric: Patient has normal mood and affect    Ortho Exam: Ortho exam demonstrates antalgic gait to the left.  Medial greater than lateral joint line tenderness.  She does have difficulty with flexion past about 60 degrees.  Extensor mechanism is intact.  Pedal pulses palpable.  No groin pain with internal extra rotation of the leg.  Specialty Comments:  No specialty comments available.  Imaging: No results found.   PMFS History: Patient Active Problem List   Diagnosis Date Noted  . Left knee injury, subsequent encounter 01/14/2020  . Carpal tunnel syndrome 10/23/2019  . Trigger finger, right 08/14/2019  .  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 Pneumonia due to COVID-19 virus 06/18/2019  . Acute respiratory failure with hypoxia ([facility]) 06/18/2019  . Renal insufficiency 06/18/2019  . Hyperkalemia 10/30/2018  . Posterior knee pain, right 02/21/2018  . Essential hypertension 01/30/2018  . Mass on back 01/30/2018  . Cervical radiculopathy 04/30/2017  . Left wrist pain 11/28/2016  . Pre-diabetes 02/16/2016  . OSA (obstructive sleep apnea) 05/14/2015  . Chronic lumbar radiculopathy 11/03/2014  . Other allergic rhinitis 09/07/2014  . Health care maintenance 09/07/2014  . OBESITY, MORBID 09/20/2006  . Anemia,  iron deficiency 08/09/2006   Past Medical History:  Diagnosis Date  . Anemia   . Anxiety   . Arthritis    "left knee"  . Chronic lower back pain    "on the left side"  . Headache(784.0) 02/29/12   "frequent"  . Hypertension   . Left knee pain 11/04/2015  . Migraines   . Pneumonia 2006  . Renal insufficiency 06/18/2019  . Sleep apnea    CPAP  . Stress at home    "and at work"    Family History  Problem Relation Age of Onset  . Cancer Other     Past Surgical History:  Procedure Laterality Date  . BREAST BIOPSY Left 07/29/2013  . CESAREAN SECTION  08/2008  . CHOLECYSTECTOMY  03/02/2012   Procedure: LAPAROSCOPIC CHOLECYSTECTOMY;  Surgeon: Gwenyth Ober, MD;  Location: [facility];  Service: General;;  . KNEE ARTHROSCOPY WITH MENISCAL REPAIR Right 06/25/2018   Procedure: RIGHT KNEE ARTHROSCOPY WITH MENISCAL ROOT REPAIR;  Surgeon: Meredith Pel, MD;  Location: [facility];  Service: Orthopedics;  Laterality: Right;  . WISDOM TOOTH EXTRACTION     Social History   Occupational History  . Not on file  Tobacco Use  . Smoking status: Never Smoker  . Smokeless tobacco: Never Used  Substance and Sexual Activity  . Alcohol use: No    Alcohol/week: 0.0 standard drinks  . Drug use: No  . Sexual activity: Not on file

## 2020-01-22 DIAGNOSIS — G4733 Obstructive sleep apnea (adult) (pediatric): Secondary | ICD-10-CM | POA: Diagnosis not present

## 2020-01-26 ENCOUNTER — Telehealth: Payer: Self-pay | Admitting: Orthopedic Surgery

## 2020-01-26 ENCOUNTER — Encounter: Payer: Self-pay | Admitting: *Deleted

## 2020-01-26 NOTE — Telephone Encounter (Signed)
Pls advise.  

## 2020-01-26 NOTE — Telephone Encounter (Signed)
Patient called. Says Dr. August Saucer wrote a note for her to be out of work for 3 weeks but her MRI is not scheduled until 02/20/2020. Would like to know if he could extend the days on her work note. Her call back number is [phone]

## 2020-01-27 NOTE — Telephone Encounter (Signed)
IC advised could pick up at front desk.  

## 2020-02-20 ENCOUNTER — Ambulatory Visit
Admission: RE | Admit: 2020-02-20 | Discharge: 2020-02-20 | Disposition: A | Discharge status: Home / Self Care | Payer: Medicaid Other | Source: Ambulatory Visit | Attending: Orthopedic Surgery | Admitting: Orthopedic Surgery

## 2020-02-20 ENCOUNTER — Other Ambulatory Visit: Payer: Self-pay

## 2020-02-20 ENCOUNTER — Ambulatory Visit: Payer: Medicaid Other | Admitting: Orthopedic Surgery

## 2020-02-20 DIAGNOSIS — M25562 Pain in left knee: Secondary | ICD-10-CM | POA: Diagnosis not present

## 2020-02-25 ENCOUNTER — Ambulatory Visit: Payer: Medicaid Other | Admitting: Orthopedic Surgery

## 2020-02-27 ENCOUNTER — Ambulatory Visit (INDEPENDENT_AMBULATORY_CARE_PROVIDER_SITE_OTHER): Payer: Medicaid Other | Admitting: Surgical

## 2020-02-27 ENCOUNTER — Other Ambulatory Visit: Payer: Self-pay

## 2020-02-27 ENCOUNTER — Encounter: Payer: Self-pay | Admitting: Surgical

## 2020-02-27 DIAGNOSIS — S838X2A Sprain of other specified parts of left knee, initial encounter: Secondary | ICD-10-CM | POA: Diagnosis not present

## 2020-02-27 DIAGNOSIS — M1712 Unilateral primary osteoarthritis, left knee: Secondary | ICD-10-CM | POA: Diagnosis not present

## 2020-02-27 NOTE — Progress Notes (Signed)
Office Visit Note   Patient: [name] [name]           Date of Birth: Mar 20, 1971           MRN: [id number] Visit Date: 02/27/2020 Requested by: Kathi Ludwig, MD [place],  [place] [address] PCP: Kathi Ludwig, MD  Subjective: Chief Complaint  Patient presents with  . Follow-up    HPI: [name] [name] is a 49 y.o. female who presents to the office complaining of left knee pain.  She returns to discuss MRI results.  MRI of the left knee revealed a large radial tear of the root of the posterior horn the medial meniscus with peripheral meniscal extrusion as well as tricompartmental degenerative changes, worst in the lateral compartment with associated bony edema of the lateral proximal tibia.  Patient notes that she is experiencing medial and lateral sided left knee pain, worst in the lateral compartment.  She localizes the majority of her lateral pain to the anterior lateral aspect of the left knee as well as the posterior medial left knee.  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  She is experiencing mechanical symptoms in the left knee with locking that is now progressed to occurring every single day.  She denies any history of diabetes, heart disease, smoking, blood clot history..                ROS:  All systems reviewed are negative as they relate to the chief complaint within the history of present illness.  Patient denies fevers or chills.  Assessment & Plan: Visit Diagnoses: No diagnosis found.  Plan: Patient is a 49 year old female presents complaining of left knee pain.  She has both medial and lateral pain.  She has a medial meniscal root tear as well as degenerative changes throughout the left knee, worst in the lateral compartment with associated bony edema.  She is experiencing mechanical symptoms.  After discussion of potential options, patient wishes to proceed  with left knee arthroscopy with meniscal root repair.  We will further examine patient's degenerative changes while arthroscopy is underway.  Discussed the risks and benefits of this procedure including infection, nerve, vessel damage.  Also discussed the need for a period of nonweightbearing immediately after the surgery in order to let the meniscal root repair heal.  Due to patient's weight, there is a potential chance that the repair fails but I think that overall trying this will prolong the health of her knee as long as possible and delay the need for a likely knee replacement in the future.  Patient understands all these risks and wishes to proceed with procedure.  She will be posted for surgery.  Follow-up after surgical procedure.  Follow-Up Instructions: No follow-ups on file.   Orders:  No orders of the defined types were placed in this encounter.  No orders of the defined types were placed in this encounter.     Procedures: No procedures performed   Clinical Data: No additional findings.  Objective: Vital Signs: There were no vitals taken for this visit.  Physical Exam:  Constitutional: Patient appears well-developed HEENT:  Head: Normocephalic Eyes:EOM are normal Neck: Normal range of motion Cardiovascular: Normal rate Pulmonary/chest: Effort normal Neurologic: Patient is alert Skin: Skin is warm Psychiatric: Patient has normal mood and affect  Ortho Exam:  Left knee Exam Positive effusion Tender to palpation  of the medial lateral joint lines Extensor mechanism intact No TTP over the quad tendon, patellar tendon, pes anserinus, patella, tibial tubercle, LCL/MCL insertions Stable to varus/valgus stresses.  Stable to anterior/posterior drawer Extension to 0 degrees Flexion > 90 degrees  Specialty Comments:  No specialty comments available.  Imaging: No results found.   PMFS History: Patient Active Problem List   Diagnosis Date Noted  . Left knee injury,  subsequent encounter 01/14/2020  . Carpal tunnel syndrome 10/23/2019  . Trigger finger, right 08/14/2019  . 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 Pneumonia due to COVID-19 virus 06/18/2019  . Acute respiratory failure with hypoxia ([facility]) 06/18/2019  . Renal insufficiency 06/18/2019  . Hyperkalemia 10/30/2018  . Posterior knee pain, right 02/21/2018  . Essential hypertension 01/30/2018  . Mass on back 01/30/2018  . Cervical radiculopathy 04/30/2017  . Left wrist pain 11/28/2016  . Pre-diabetes 02/16/2016  . OSA (obstructive sleep apnea) 05/14/2015  . Chronic lumbar radiculopathy 11/03/2014  . Other allergic rhinitis 09/07/2014  . Health care maintenance 09/07/2014  . OBESITY, MORBID 09/20/2006  . Anemia, iron deficiency 08/09/2006   Past Medical History:  Diagnosis Date  . Anemia   . Anxiety   . Arthritis    "left knee"  . Chronic lower back pain    "on the left side"  . Headache(784.0) 02/29/12   "frequent"  . Hypertension   . Left knee pain 11/04/2015  . Migraines   . Pneumonia 2006  . Renal insufficiency 06/18/2019  . Sleep apnea    CPAP  . Stress at home    "and at work"    Family History  Problem Relation Age of Onset  . Cancer Other     Past Surgical History:  Procedure Laterality Date  . BREAST BIOPSY Left 07/29/2013  . CESAREAN SECTION  08/2008  . CHOLECYSTECTOMY  03/02/2012   Procedure: LAPAROSCOPIC CHOLECYSTECTOMY;  Surgeon: Cherylynn Ridges, MD;  Location: [facility] OR;  Service: General;;  . KNEE ARTHROSCOPY WITH MENISCAL REPAIR Right 06/25/2018   Procedure: RIGHT KNEE ARTHROSCOPY WITH MENISCAL ROOT REPAIR;  Surgeon: Cammy Copa, MD;  Location: [facility] OR;  Service: Orthopedics;  Laterality: Right;  . WISDOM TOOTH EXTRACTION     Social History   Occupational History  . Not on file  Tobacco Use  . Smoking status: Never Smoker  . Smokeless tobacco: Never Used  Substance and Sexual Activity  . Alcohol use: No    Alcohol/week: 0.0 standard drinks  . Drug use: No    . Sexual activity: Not on file

## 2020-03-01 ENCOUNTER — Encounter: Payer: Self-pay | Admitting: Internal Medicine

## 2020-03-02 ENCOUNTER — Other Ambulatory Visit: Payer: Self-pay

## 2020-03-04 NOTE — Telephone Encounter (Signed)
Called [facility], the tech states pt has only family planning medicaid and it does not cover this med at all, no PA will help. Called pt lm for rtc, will ask if she has regular medicaid and just has forgotten to give [facility] the info

## 2020-03-04 NOTE — Pre-Procedure Instructions (Signed)
Your procedure is scheduled on Tuesday, June 15th.  Report to [facility] Main Entrance "A" at 1:00 P.M., and check in at the Admitting office.  Call this number if you have problems the morning of surgery:  [phone]  Call [phone] if you have any questions prior to your surgery date Monday-Friday 8am-4pm    Remember:  Do not eat after midnight the night before your surgery  You may drink clear liquids until 12:00p.m. the afternoon of your surgery.   Clear liquids allowed are: Water, Non-Citrus Juices (without pulp), Carbonated Beverages, Clear Tea, Black Coffee Only, and Gatorade.   Enhanced Recovery after Surgery for Orthopedics Enhanced Recovery after Surgery is a protocol used to improve the stress on your body and your recovery after surgery.  Patient Instructions  . The night before surgery:  o No food after midnight. ONLY clear liquids after midnight  .  Marland Kitchen The day of surgery (if you do NOT have diabetes):  o Drink ONE (1) Pre-Surgery Clear Ensure as directed.   o This drink was given to you during your hospital  pre-op appointment visit. o The pre-op nurse will instruct you on the time to drink the  Pre-Surgery Ensure depending on your surgery time. o Finish the drink by 12:00 p.m. o Nothing else to drink after completing the  Pre-Surgery Clear Ensure.         If you have questions, please contact your surgeon's office.     Take these medicines the morning of surgery with A SIP OF WATER  DULoxetine (CYMBALTA)  gabapentin (NEURONTIN)   As needed: fluticasone (FLONASE), cyclobenzaprine (FLEXERIL), Albuterol Inhaler-please bring inhaler with you to the hospital.   As of today, STOP taking any Aspirin (unless otherwise instructed by your surgeon) and Aspirin containing products, Aleve, Naproxen, Ibuprofen, Motrin, Advil, Goody's, BC's, all herbal medications, fish oil, and all vitamins.                      Do not wear jewelry, make up, or nail polish             Do not wear lotions, powders, perfumes, or deodorant.            Do not shave 48 hours prior to surgery.              Do not bring valuables to the hospital.            [facility] is not responsible for any belongings or valuables.  Do NOT Smoke (Tobacco/Vaping) or drink Alcohol 24 hours prior to your procedure If you use a CPAP at night, you may bring all equipment for your overnight stay.   Contacts, glasses, dentures or bridgework may not be worn into surgery.      For patients admitted to the hospital, discharge time will be determined by your treatment team.   Patients discharged the day of surgery will not be allowed to drive home, and someone needs to stay with them for 24 hours.    Special instructions:   [facility]- Preparing For Surgery  Before surgery, you can play an important role. Because skin is not sterile, your skin needs to be as free of germs as possible. You can reduce the number of germs on your skin by washing with CHG (chlorahexidine gluconate) Soap before surgery.  CHG is an antiseptic cleaner which kills germs and bonds with the skin to continue killing germs even after washing.  Oral Hygiene is also important to reduce your risk of infection.  Remember - BRUSH YOUR TEETH THE MORNING OF SURGERY WITH YOUR REGULAR TOOTHPASTE  Please do not use if you have an allergy to CHG or antibacterial soaps. If your skin becomes reddened/irritated stop using the CHG.  Do not shave (including legs and underarms) for at least 48 hours prior to first CHG shower. It is OK to shave your face.  Please follow these instructions carefully.   1. Shower the NIGHT BEFORE SURGERY and the MORNING OF SURGERY with CHG Soap.   2. If you chose to wash your hair, wash your hair first as usual with your normal shampoo.  3. After you shampoo, rinse your hair and body thoroughly to remove the shampoo.  4. Use CHG as you would any other liquid soap. You can apply CHG directly to  the skin and wash gently with a scrungie or a clean washcloth.   5. Apply the CHG Soap to your body ONLY FROM THE NECK DOWN.  Do not use on open wounds or open sores. Avoid contact with your eyes, ears, mouth and genitals (private parts). Wash Face and genitals (private parts)  with your normal soap.   6. Wash thoroughly, paying special attention to the area where your surgery will be performed.  7. Thoroughly rinse your body with warm water from the neck down.  8. DO NOT shower/wash with your normal soap after using and rinsing off the CHG Soap.  9. Pat yourself dry with a CLEAN TOWEL.  10. Wear CLEAN PAJAMAS to bed the night before surgery, wear comfortable clothes the morning of surgery  11. Place CLEAN SHEETS on your bed the night of your first shower and DO NOT SLEEP WITH PETS.   Day of Surgery:   Do not apply any deodorants/lotions.  Please wear clean clothes to the hospital/surgery center.   Remember to brush your teeth WITH YOUR REGULAR TOOTHPASTE.   Please read over the following fact sheets that you were given.

## 2020-03-05 ENCOUNTER — Encounter (HOSPITAL_COMMUNITY): Payer: Self-pay

## 2020-03-05 ENCOUNTER — Other Ambulatory Visit (HOSPITAL_COMMUNITY)
Admission: RE | Admit: 2020-03-05 | Discharge: 2020-03-05 | Disposition: A | Discharge status: Home / Self Care | Payer: Medicaid Other | Source: Ambulatory Visit | Attending: Orthopedic Surgery | Admitting: Orthopedic Surgery

## 2020-03-05 ENCOUNTER — Other Ambulatory Visit: Payer: Self-pay

## 2020-03-05 ENCOUNTER — Encounter (HOSPITAL_COMMUNITY)
Admission: RE | Admit: 2020-03-05 | Discharge: 2020-03-05 | Disposition: A | Discharge status: Home / Self Care | Payer: Medicaid Other | Source: Ambulatory Visit | Attending: Orthopedic Surgery | Admitting: Orthopedic Surgery

## 2020-03-05 DIAGNOSIS — Z01812 Encounter for preprocedural laboratory examination: Secondary | ICD-10-CM | POA: Insufficient documentation

## 2020-03-05 DIAGNOSIS — Z20822 Contact with and (suspected) exposure to covid-19: Secondary | ICD-10-CM | POA: Diagnosis not present

## 2020-03-05 LAB — CBC
HCT: 41.2 % (ref 36.0–46.0)
Hemoglobin: 12.6 g/dL (ref 12.0–15.0)
MCH: 25.8 pg — ABNORMAL LOW (ref 26.0–34.0)
MCHC: 30.6 g/dL (ref 30.0–36.0)
MCV: 84.3 fL (ref 80.0–100.0)
Platelets: 425 10*3/uL — ABNORMAL HIGH (ref 150–400)
RBC: 4.89 MIL/uL (ref 3.87–5.11)
RDW: 15.4 % (ref 11.5–15.5)
WBC: 6.9 10*3/uL (ref 4.0–10.5)
nRBC: 0 % (ref 0.0–0.2)

## 2020-03-05 LAB — BASIC METABOLIC PANEL
Anion gap: 7 (ref 5–15)
BUN: 7 mg/dL (ref 6–20)
CO2: 24 mmol/L (ref 22–32)
Calcium: 9 mg/dL (ref 8.9–10.3)
Chloride: 110 mmol/L (ref 98–111)
Creatinine, Ser: 0.82 mg/dL (ref 0.44–1.00)
GFR calc Af Amer: >60 mL/min (ref >60)
GFR calc non Af Amer: >60 mL/min (ref >60)
Glucose, Bld: 126 mg/dL — ABNORMAL HIGH (ref 70–99)
Potassium: 4.3 mmol/L (ref 3.5–5.1)
Sodium: 141 mmol/L (ref 135–145)

## 2020-03-05 LAB — SARS CORONAVIRUS 2 (TAT 6-24 HRS): SARS Coronavirus 2: NEGATIVE

## 2020-03-05 NOTE — Progress Notes (Signed)
PCP - Dr. Lanelle Bal Cardiologist - denies  PPM/ICD - n/a Device Orders -n/a  Rep Notified - n/a  Chest x-ray - 06/18/19-1 view EKG - 07/14/19 Stress Test - denies ECHO - denies Cardiac Cath - denies  Sleep Study - OSA+ CPAP - uses nightly, setting is 10.  Blood Thinner Instructions:N/A Aspirin Instructions:N/A  ERAS Protcol -Protocol implemented; instructions provided to pt. PRE-SURGERY Ensure or G2- pt provided with (1) PRE-SURGICAL ENSURE  COVID TEST- Pt scheduled to go after PAT appointment.   Anesthesia review: No  Patient denies shortness of breath, fever, cough and chest pain at PAT appointment   All instructions explained to the patient, with a verbal understanding of the material. Patient agrees to go over the instructions while at home for a better understanding. Patient also instructed to self quarantine after being tested for COVID-19. The opportunity to ask questions was provided.   Coronavirus Screening  Have you experienced the following symptoms:  Cough yes/no: No Fever (>100.56F)  yes/no: No Runny nose yes/no: No Sore throat yes/no: No Difficulty breathing/shortness of breath  yes/no: No  Have you or a family member traveled in the last 14 days and where? yes/no: No   If the patient indicates "YES" to the above questions, their PAT will be rescheduled to limit the exposure to others and, the surgeon will be notified. THE PATIENT WILL NEED TO BE ASYMPTOMATIC FOR 14 DAYS.   If the patient is not experiencing any of these symptoms, the PAT nurse will instruct them to NOT bring anyone with them to their appointment since they may have these symptoms or traveled as well.   Please remind your patients and families that hospital visitation restrictions are in effect and the importance of the restrictions.

## 2020-03-08 MED ORDER — DEXTROSE 5 % IV SOLN
3.0000 g | INTRAVENOUS | Status: AC
  Administered 2020-03-09: 3 g via INTRAVENOUS
  Filled 2020-03-08: qty 3

## 2020-03-09 ENCOUNTER — Other Ambulatory Visit: Payer: Self-pay

## 2020-03-09 ENCOUNTER — Encounter (HOSPITAL_COMMUNITY)
Admission: RE | Disposition: A | Discharge status: Home / Self Care | Payer: Self-pay | Source: Ambulatory Visit | Attending: Orthopedic Surgery

## 2020-03-09 ENCOUNTER — Ambulatory Visit (HOSPITAL_COMMUNITY): Payer: Medicaid Other | Admitting: Anesthesiology

## 2020-03-09 ENCOUNTER — Ambulatory Visit (HOSPITAL_COMMUNITY)
Admission: RE | Admit: 2020-03-09 | Discharge: 2020-03-09 | Disposition: A | Discharge status: Home / Self Care | Payer: Medicaid Other | Source: Ambulatory Visit | Attending: Orthopedic Surgery | Admitting: Orthopedic Surgery

## 2020-03-09 ENCOUNTER — Encounter (HOSPITAL_COMMUNITY): Payer: Self-pay | Admitting: Orthopedic Surgery

## 2020-03-09 DIAGNOSIS — M94262 Chondromalacia, left knee: Secondary | ICD-10-CM | POA: Insufficient documentation

## 2020-03-09 DIAGNOSIS — G4730 Sleep apnea, unspecified: Secondary | ICD-10-CM | POA: Diagnosis not present

## 2020-03-09 DIAGNOSIS — J9601 Acute respiratory failure with hypoxia: Secondary | ICD-10-CM | POA: Diagnosis not present

## 2020-03-09 DIAGNOSIS — S83242D Other tear of medial meniscus, current injury, left knee, subsequent encounter: Secondary | ICD-10-CM

## 2020-03-09 DIAGNOSIS — J1282 Pneumonia due to coronavirus disease 2019: Secondary | ICD-10-CM | POA: Diagnosis not present

## 2020-03-09 DIAGNOSIS — Z79899 Other long term (current) drug therapy: Secondary | ICD-10-CM | POA: Insufficient documentation

## 2020-03-09 DIAGNOSIS — U071 COVID-19: Secondary | ICD-10-CM | POA: Diagnosis not present

## 2020-03-09 DIAGNOSIS — S83242A Other tear of medial meniscus, current injury, left knee, initial encounter: Secondary | ICD-10-CM | POA: Diagnosis not present

## 2020-03-09 DIAGNOSIS — M23222 Derangement of posterior horn of medial meniscus due to old tear or injury, left knee: Secondary | ICD-10-CM | POA: Diagnosis not present

## 2020-03-09 DIAGNOSIS — F419 Anxiety disorder, unspecified: Secondary | ICD-10-CM | POA: Insufficient documentation

## 2020-03-09 DIAGNOSIS — G8918 Other acute postprocedural pain: Secondary | ICD-10-CM | POA: Diagnosis not present

## 2020-03-09 DIAGNOSIS — M238X2 Other internal derangements of left knee: Secondary | ICD-10-CM | POA: Diagnosis not present

## 2020-03-09 DIAGNOSIS — I10 Essential (primary) hypertension: Secondary | ICD-10-CM | POA: Insufficient documentation

## 2020-03-09 HISTORY — PX: KNEE ARTHROSCOPY WITH MENISCAL REPAIR: SHX5653

## 2020-03-09 LAB — POCT PREGNANCY, URINE: Preg Test, Ur: NEGATIVE

## 2020-03-09 SURGERY — ARTHROSCOPY, KNEE, WITH MENISCUS REPAIR
Anesthesia: General | Site: Knee | Laterality: Left

## 2020-03-09 MED ORDER — ROPIVACAINE HCL 7.5 MG/ML IJ SOLN
INTRAMUSCULAR | Status: DC | PRN
Start: 2020-03-09 — End: 2020-03-09
  Administered 2020-03-09: 20 mL via PERINEURAL

## 2020-03-09 MED ORDER — MORPHINE SULFATE (PF) 4 MG/ML IV SOLN
INTRAVENOUS | Status: DC | PRN
  Administered 2020-03-09: 8 mg via INTRAMUSCULAR

## 2020-03-09 MED ORDER — EPINEPHRINE PF 1 MG/ML IJ SOLN
INTRAMUSCULAR | Status: AC
  Filled 2020-03-09: qty 2

## 2020-03-09 MED ORDER — MIDAZOLAM HCL 2 MG/2ML IJ SOLN: 2.0000 mg | Freq: Once | INTRAMUSCULAR | Status: AC

## 2020-03-09 MED ORDER — MIDAZOLAM HCL 2 MG/2ML IJ SOLN
INTRAMUSCULAR | Status: AC
  Filled 2020-03-09: qty 2

## 2020-03-09 MED ORDER — BUPIVACAINE-EPINEPHRINE (PF) 0.5% -1:200000 IJ SOLN: INTRAMUSCULAR | Status: DC | PRN

## 2020-03-09 MED ORDER — FENTANYL CITRATE (PF) 100 MCG/2ML IJ SOLN: 100.0000 ug | Freq: Once | INTRAMUSCULAR | Status: AC

## 2020-03-09 MED ORDER — ROCURONIUM BROMIDE 100 MG/10ML IV SOLN
INTRAVENOUS | Status: DC | PRN
Start: 2020-03-09 — End: 2020-03-09
  Administered 2020-03-09: 70 mg via INTRAVENOUS

## 2020-03-09 MED ORDER — PHENYLEPHRINE 40 MCG/ML (10ML) SYRINGE FOR IV PUSH (FOR BLOOD PRESSURE SUPPORT)
PREFILLED_SYRINGE | INTRAVENOUS | Status: DC | PRN
  Administered 2020-03-09 (×2): 120 ug via INTRAVENOUS
  Administered 2020-03-09 (×2): 80 ug via INTRAVENOUS

## 2020-03-09 MED ORDER — ORAL CARE MOUTH RINSE: 15.0000 mL | Freq: Once | OROMUCOSAL | Status: AC

## 2020-03-09 MED ORDER — ONDANSETRON HCL 4 MG/2ML IJ SOLN
INTRAMUSCULAR | Status: AC
  Filled 2020-03-09: qty 2

## 2020-03-09 MED ORDER — PROPOFOL 10 MG/ML IV BOLUS
INTRAVENOUS | Status: DC | PRN
  Administered 2020-03-09: 50 mg via INTRAVENOUS
  Administered 2020-03-09: 150 mg via INTRAVENOUS

## 2020-03-09 MED ORDER — POVIDONE-IODINE 7.5 % EX SOLN
Freq: Once | CUTANEOUS | Status: DC
  Filled 2020-03-09: qty 118

## 2020-03-09 MED ORDER — SODIUM CHLORIDE 0.9 % IR SOLN
Status: DC | PRN
  Administered 2020-03-09: 1

## 2020-03-09 MED ORDER — SUGAMMADEX SODIUM 200 MG/2ML IV SOLN
INTRAVENOUS | Status: DC | PRN
  Administered 2020-03-09: 400 mg via INTRAVENOUS

## 2020-03-09 MED ORDER — CHLORHEXIDINE GLUCONATE 0.12 % MT SOLN: 15.0000 mL | Freq: Once | OROMUCOSAL | Status: AC

## 2020-03-09 MED ORDER — LIDOCAINE 2% (20 MG/ML) 5 ML SYRINGE
INTRAMUSCULAR | Status: DC | PRN
  Administered 2020-03-09: 100 mg via INTRAVENOUS

## 2020-03-09 MED ORDER — SUCCINYLCHOLINE CHLORIDE 20 MG/ML IJ SOLN
INTRAMUSCULAR | Status: DC | PRN
  Administered 2020-03-09: 100 mg via INTRAVENOUS

## 2020-03-09 MED ORDER — ONDANSETRON HCL 4 MG/2ML IJ SOLN
INTRAMUSCULAR | Status: DC | PRN
  Administered 2020-03-09: 4 mg via INTRAVENOUS

## 2020-03-09 MED ORDER — LACTATED RINGERS IV SOLN: INTRAVENOUS | Status: DC

## 2020-03-09 MED ORDER — SODIUM CHLORIDE 0.9 % IR SOLN
Status: DC | PRN
  Administered 2020-03-09: 6000 mL

## 2020-03-09 MED ORDER — MIDAZOLAM HCL 5 MG/5ML IJ SOLN
INTRAMUSCULAR | Status: DC | PRN
  Administered 2020-03-09: 1 mg via INTRAVENOUS

## 2020-03-09 MED ORDER — MORPHINE SULFATE (PF) 4 MG/ML IV SOLN
INTRAVENOUS | Status: AC
  Filled 2020-03-09: qty 2

## 2020-03-09 MED ORDER — BUPIVACAINE-EPINEPHRINE 0.5% -1:200000 IJ SOLN
INTRAMUSCULAR | Status: AC
  Filled 2020-03-09: qty 1

## 2020-03-09 MED ORDER — FENTANYL CITRATE (PF) 100 MCG/2ML IJ SOLN
INTRAMUSCULAR | Status: AC
  Administered 2020-03-09: 100 ug via INTRAVENOUS
  Filled 2020-03-09: qty 2

## 2020-03-09 MED ORDER — OXYCODONE-ACETAMINOPHEN 5-325 MG PO TABS: 1.0000 | ORAL_TABLET | ORAL | 0 refills | Status: DC | PRN

## 2020-03-09 MED ORDER — BUPIVACAINE-EPINEPHRINE (PF) 0.5% -1:200000 IJ SOLN
INTRAMUSCULAR | Status: DC | PRN
  Administered 2020-03-09: 20 mL
  Administered 2020-03-09: 10 mL

## 2020-03-09 MED ORDER — CHLORHEXIDINE GLUCONATE 0.12 % MT SOLN
OROMUCOSAL | Status: AC
  Administered 2020-03-09: 15 mL via OROMUCOSAL
  Filled 2020-03-09: qty 15

## 2020-03-09 MED ORDER — DEXAMETHASONE SODIUM PHOSPHATE 10 MG/ML IJ SOLN
INTRAMUSCULAR | Status: DC | PRN
Start: 2020-03-09 — End: 2020-03-09
  Administered 2020-03-09: 5 mg via INTRAVENOUS

## 2020-03-09 MED ORDER — POVIDONE-IODINE 10 % EX SWAB: 2.0000 "application " | Freq: Once | CUTANEOUS | Status: DC

## 2020-03-09 MED ORDER — MIDAZOLAM HCL 2 MG/2ML IJ SOLN
INTRAMUSCULAR | Status: AC
  Administered 2020-03-09: 2 mg via INTRAVENOUS
  Filled 2020-03-09: qty 2

## 2020-03-09 MED ORDER — CLONIDINE HCL (ANALGESIA) 100 MCG/ML EP SOLN
EPIDURAL | Status: DC | PRN
  Administered 2020-03-09: 100 ug

## 2020-03-09 MED ORDER — ASPIRIN EC 81 MG PO TBEC
81.0000 mg | DELAYED_RELEASE_TABLET | Freq: Every day | ORAL | 0 refills | Status: DC
Start: 2020-03-09 — End: 2020-05-11

## 2020-03-09 MED ORDER — FENTANYL CITRATE (PF) 250 MCG/5ML IJ SOLN
INTRAMUSCULAR | Status: AC
  Filled 2020-03-09: qty 5

## 2020-03-09 MED ORDER — FENTANYL CITRATE (PF) 100 MCG/2ML IJ SOLN
INTRAMUSCULAR | Status: DC | PRN
  Administered 2020-03-09: 50 ug via INTRAVENOUS

## 2020-03-09 MED ORDER — POVIDONE-IODINE 10 % EX SWAB
2.0000 "application " | Freq: Once | CUTANEOUS | Status: AC
  Administered 2020-03-09: 2 via TOPICAL

## 2020-03-09 MED ORDER — BUPIVACAINE HCL (PF) 0.25 % IJ SOLN
INTRAMUSCULAR | Status: AC
  Filled 2020-03-09: qty 30

## 2020-03-09 MED ORDER — PROPOFOL 10 MG/ML IV BOLUS
INTRAVENOUS | Status: AC
  Filled 2020-03-09: qty 20

## 2020-03-09 MED ORDER — CLONIDINE HCL (ANALGESIA) 100 MCG/ML EP SOLN
EPIDURAL | Status: AC
  Filled 2020-03-09: qty 10

## 2020-03-09 SURGICAL SUPPLY — 67 items
ALCOHOL 70% 16 OZ (MISCELLANEOUS) ×3 IMPLANT
BANDAGE ESMARK 6X9 LF (GAUZE/BANDAGES/DRESSINGS) IMPLANT
BLADE CLIPPER SURG (BLADE) IMPLANT
BLADE EXCALIBUR 4.0MM X 13CM (MISCELLANEOUS) ×1
BLADE EXCALIBUR 4.0X13 (MISCELLANEOUS) ×2 IMPLANT
BLADE SURG 10 STRL SS (BLADE) ×3 IMPLANT
BLADE SURG 15 STRL LF DISP TIS (BLADE) ×1 IMPLANT
BLADE SURG 15 STRL SS (BLADE) ×3
BNDG ELASTIC 4X5.8 VLCR STR LF (GAUZE/BANDAGES/DRESSINGS) ×3 IMPLANT
BNDG ELASTIC 6X5.8 VLCR STR LF (GAUZE/BANDAGES/DRESSINGS) IMPLANT
BNDG ESMARK 6X9 LF (GAUZE/BANDAGES/DRESSINGS)
COVER SURGICAL LIGHT HANDLE (MISCELLANEOUS) ×3 IMPLANT
COVER WAND RF STERILE (DRAPES) IMPLANT
CUFF TOURN SGL QUICK 34 (TOURNIQUET CUFF)
CUFF TOURN SGL QUICK 42 (TOURNIQUET CUFF) IMPLANT
CUFF TRNQT CYL 34X4.125X (TOURNIQUET CUFF) IMPLANT
DRAPE ARTHROSCOPY W/POUCH 114 (DRAPES) ×3 IMPLANT
DRAPE HALF SHEET 40X57 (DRAPES) ×3 IMPLANT
DRAPE INCISE IOBAN 66X45 STRL (DRAPES) IMPLANT
DRAPE U-SHAPE 47X51 STRL (DRAPES) ×3 IMPLANT
DRSG TEGADERM 4X4.75 (GAUZE/BANDAGES/DRESSINGS) ×3 IMPLANT
DURAPREP 26ML APPLICATOR (WOUND CARE) ×3 IMPLANT
DW OUTFLOW CASSETTE/TUBE SET (MISCELLANEOUS) ×3 IMPLANT
ELECT REM PT RETURN 9FT ADLT (ELECTROSURGICAL) ×3
ELECTRODE REM PT RTRN 9FT ADLT (ELECTROSURGICAL) ×1 IMPLANT
EXCALIBUR 3.8MM X 13CM (MISCELLANEOUS) ×3 IMPLANT
GAUZE SPONGE 4X4 12PLY STRL (GAUZE/BANDAGES/DRESSINGS) ×6 IMPLANT
GAUZE XEROFORM 1X8 LF (GAUZE/BANDAGES/DRESSINGS) ×6 IMPLANT
GLOVE BIOGEL PI IND STRL 7.0 (GLOVE) ×1 IMPLANT
GLOVE BIOGEL PI IND STRL 8 (GLOVE) ×1 IMPLANT
GLOVE BIOGEL PI INDICATOR 7.0 (GLOVE) ×2
GLOVE BIOGEL PI INDICATOR 8 (GLOVE) ×2
GLOVE ECLIPSE 7.0 STRL STRAW (GLOVE) ×3 IMPLANT
GLOVE ECLIPSE 8.0 STRL XLNG CF (GLOVE) ×3 IMPLANT
GOWN STRL REUS W/ TWL LRG LVL3 (GOWN DISPOSABLE) ×2 IMPLANT
GOWN STRL REUS W/ TWL XL LVL3 (GOWN DISPOSABLE) ×1 IMPLANT
GOWN STRL REUS W/TWL LRG LVL3 (GOWN DISPOSABLE) ×6
GOWN STRL REUS W/TWL XL LVL3 (GOWN DISPOSABLE) ×3
KIT BASIN OR (CUSTOM PROCEDURE TRAY) ×3 IMPLANT
KIT TURNOVER KIT B (KITS) ×3 IMPLANT
MANIFOLD NEPTUNE II (INSTRUMENTS) ×3 IMPLANT
NEEDLE 18GX1X1/2 (RX/OR ONLY) (NEEDLE) ×3 IMPLANT
NEEDLE HYPO 25GX1X1/2 BEV (NEEDLE) ×3 IMPLANT
NEEDLE SUT 2-0 SCORPION KNEE (NEEDLE) IMPLANT
NS IRRIG 1000ML POUR BTL (IV SOLUTION) ×3 IMPLANT
PACK ARTHROSCOPY DSU (CUSTOM PROCEDURE TRAY) ×3 IMPLANT
PAD ARMBOARD 7.5X6 YLW CONV (MISCELLANEOUS) ×6 IMPLANT
PAD CAST 4YDX4 CTTN HI CHSV (CAST SUPPLIES) ×1 IMPLANT
PADDING CAST COTTON 4X4 STRL (CAST SUPPLIES) ×3
PADDING CAST COTTON 6X4 STRL (CAST SUPPLIES) ×3 IMPLANT
PENCIL BUTTON HOLSTER BLD 10FT (ELECTRODE) ×3 IMPLANT
SOL PREP POV-IOD 4OZ 10% (MISCELLANEOUS) ×3 IMPLANT
SPONGE LAP 4X18 RFD (DISPOSABLE) ×3 IMPLANT
SUT ETHILON 3 0 PS 1 (SUTURE) ×3 IMPLANT
SUT FIBERWIRE 2-0 18 17.9 3/8 (SUTURE)
SUT MENISCAL KIT (KITS) IMPLANT
SUTURE FIBERWR 2-0 18 17.9 3/8 (SUTURE) IMPLANT
SYR 20ML ECCENTRIC (SYRINGE) ×3 IMPLANT
SYR CONTROL 10ML LL (SYRINGE) IMPLANT
SYR TB 1ML LUER SLIP (SYRINGE) ×3 IMPLANT
TOWEL GREEN STERILE (TOWEL DISPOSABLE) ×3 IMPLANT
TOWEL GREEN STERILE FF (TOWEL DISPOSABLE) ×3 IMPLANT
TUBE CONNECTING 12'X1/4 (SUCTIONS) ×1
TUBE CONNECTING 12X1/4 (SUCTIONS) ×2 IMPLANT
TUBING ARTHROSCOPY IRRIG 16FT (MISCELLANEOUS) ×3 IMPLANT
WAND STAR VAC 90 (SURGICAL WAND) IMPLANT
WATER STERILE IRR 1000ML POUR (IV SOLUTION) ×3 IMPLANT

## 2020-03-09 NOTE — Anesthesia Procedure Notes (Signed)
Procedure Name: Intubation Date/Time: 03/09/2020 4:16 PM Performed by: Avalyn Molino T, CRNA Pre-anesthesia Checklist: Patient identified, Emergency Drugs available, Suction available and Patient being monitored Patient Re-evaluated:Patient Re-evaluated prior to induction Oxygen Delivery Method: Circle system utilized Preoxygenation: Pre-oxygenation with 100% oxygen Induction Type: IV induction Laryngoscope Size: Miller and 2 Grade View: Grade II Tube type: Oral Tube size: 7.5 mm Number of attempts: 1 Airway Equipment and Method: Stylet and Oral airway Placement Confirmation: ETT inserted through vocal cords under direct vision,  positive ETCO2 and breath sounds checked- equal and bilateral Secured at: 22 cm Tube secured with: Tape Dental Injury: Teeth and Oropharynx as per pre-operative assessment

## 2020-03-09 NOTE — Transfer of Care (Signed)
Immediate Anesthesia Transfer of Care Note  Patient: [name] [name]  Procedure(s) Performed: LEFT KNEE ARTHROSCOPY, MEDIAL MENISCAL ROOT REPAIR (Left Knee)  Patient Location: PACU  Anesthesia Type:GA combined with regional for post-op pain  Level of Consciousness: drowsy  Airway & Oxygen Therapy: Patient Spontanous Breathing and Patient connected to face mask oxygen  Post-op Assessment: Report given to RN, Post -op Vital signs reviewed and stable and Patient moving all extremities  Post vital signs: Reviewed and stable  Last Vitals:  Vitals Value Taken Time  BP 90/73 03/09/20 1727  Temp    Pulse 95 03/09/20 1735  Resp 26 03/09/20 1735  SpO2 90 % 03/09/20 1735  Vitals shown include unvalidated device data.  Last Pain:  Vitals:   03/09/20 1730  TempSrc:   PainSc: 0-No pain      Patients Stated Pain Goal: 3 (03/09/20 1448)  Complications: No complications documented.

## 2020-03-09 NOTE — Anesthesia Preprocedure Evaluation (Signed)
Anesthesia Evaluation  Patient identified by MRN, date of birth, ID band Patient awake    Reviewed: Allergy & Precautions, NPO status , Patient's Chart, lab work & pertinent test results  Airway Mallampati: I  TM Distance: >3 FB Neck ROM: Full    Dental   Pulmonary sleep apnea ,    Pulmonary exam normal        Cardiovascular hypertension, Pt. on medications Normal cardiovascular exam     Neuro/Psych Anxiety    GI/Hepatic   Endo/Other    Renal/GU      Musculoskeletal   Abdominal   Peds  Hematology   Anesthesia Other Findings   Reproductive/Obstetrics                             Anesthesia Physical Anesthesia Plan  ASA: III  Anesthesia Plan: General   Post-op Pain Management:    Induction: Intravenous  PONV Risk Score and Plan: 3 and Ondansetron and Midazolam  Airway Management Planned: LMA  Additional Equipment:   Intra-op Plan:   Post-operative Plan: Extubation in OR  Informed Consent: I have reviewed the patients History and Physical, chart, labs and discussed the procedure including the risks, benefits and alternatives for the proposed anesthesia with the patient or authorized representative who has indicated his/her understanding and acceptance.       Plan Discussed with: CRNA and Surgeon  Anesthesia Plan Comments:         Anesthesia Quick Evaluation

## 2020-03-09 NOTE — Op Note (Signed)
NAME: [name] [name], [name] [name] MEDICAL RECORD DU:[id number] ACCOUNT [id number] DATE OF BIRTH:Jun 05, 1971 FACILITY: [facility] LOCATION: [facility] PHYSICIAN:Domingo Fuson Diamantina Providence, MD  OPERATIVE REPORT  DATE OF PROCEDURE:  03/09/2020  PREOPERATIVE DIAGNOSES:   1.  Left knee medial meniscal tear.   2.  Partial root avulsion.  POSTOPERATIVE DIAGNOSES:   1.  Left knee medial meniscal tear.   2.  Partial root avulsion.  PROCEDURE:  Left knee arthroscopy with partial medial meniscal root tear avulsion and debridement.  SURGEON:  Cammy Copa, MD  ASSISTANT:  Karenann Cai, PA  INDICATIONS:  The patient is a 49 year old patient with left knee pain.  She had right knee pain and meniscal debridement of radial root tear done in October.  She did well with that.  She has similar symptoms now and presents for arthroscopy and  debridement versus meniscal root repair.  PROCEDURE IN DETAIL:  The patient was brought to the operating room where general anesthetic was induced.  Preoperative antibiotics administered.  Timeout was called.  The left knee prescrubbed with alcohol and Betadine, allowed to air dry, prepped with  DuraPrep solution and draped in sterile manner.  Collier Flowers was not utilized.  Timeout was called.  Anterior inferolateral and anterior inferomedial portals were established.  Diagnostic arthroscopy was performed.  The patient had intact lateral compartment  articular cartilage.  The patient also had intact patellofemoral compartment with some mild grade I chondromalacia present.  Small area 2-3 mm of chondromalacia noted on the proximal aspect of the medial femoral condyle.  The patient did have a 70% tear  of that posterior horn of the medial meniscus.  There was attachment still present.  Meniscus was manipulated.  Radial tear was debrided.  Decision was made at this time not to perform a meniscal root repair due to incomplete detachment, also the fact  that she did well with that same procedure on  the right knee.  Following meniscal debridement, thorough irrigation was performed.  Instruments were removed.  Portals were closed using 3-0 nylon.  The patient tolerated the procedure well without immediate  complications, transferred to the recovery room in stable condition.  Portals were closed using 3-0 nylon.  A solution of Marcaine, morphine, clonidine injected into the knee for postop pain relief.  VN/NUANCE  D:03/09/2020 T:03/09/2020 JOB:[id number]

## 2020-03-09 NOTE — Anesthesia Procedure Notes (Signed)
Procedure Name: LMA Insertion Date/Time: 03/09/2020 4:13 PM Performed by: Jaycob Mcclenton T, CRNA Pre-anesthesia Checklist: Patient identified, Emergency Drugs available, Suction available and Patient being monitored Patient Re-evaluated:Patient Re-evaluated prior to induction Oxygen Delivery Method: Circle system utilized Preoxygenation: Pre-oxygenation with 100% oxygen Induction Type: IV induction LMA: LMA inserted LMA Size: 4.0 Number of attempts: 1 Placement Confirmation: positive ETCO2 and breath sounds checked- equal and bilateral Tube secured with: Tape Dental Injury: Teeth and Oropharynx as per pre-operative assessment

## 2020-03-09 NOTE — Anesthesia Procedure Notes (Signed)
Anesthesia Regional Block: Adductor canal block   Pre-Anesthetic Checklist: ,, timeout performed, Correct Patient, Correct Site, Correct Laterality, Correct Procedure, Correct Position, site marked, Risks and benefits discussed,  Surgical consent,  Pre-op evaluation,  At surgeon's request and post-op pain management  Laterality: Left  Prep: chloraprep       Needles:  Injection technique: Single-shot  Needle Type: Echogenic Stimulator Needle      Needle Gauge: 21     Additional Needles:   Narrative:  Start time: 03/09/2020 2:25 PM End time: 03/09/2020 2:35 PM Injection made incrementally with aspirations every 5 mL.  Performed by: Personally  Anesthesiologist: Arta Bruce, MD  Additional Notes: Monitors applied. Patient sedated. Sterile prep and drape,hand hygiene and sterile gloves were used. Relevant anatomy identified.Needle position confirmed.Local anesthetic injected incrementally after negative aspiration. Local anesthetic spread visualized around nerve(s). Vascular puncture avoided. No complications. Image printed for medical record.The patient tolerated the procedure well.    Arta Bruce MD

## 2020-03-09 NOTE — Brief Op Note (Signed)
   03/09/2020  5:22 PM  PATIENT:  [name] [name]  49 y.o. female  PRE-OPERATIVE DIAGNOSIS:  left medial meniscal root tear  POST-OPERATIVE DIAGNOSIS:  left medial meniscal root tear  PROCEDURE:  Procedure(s): LEFT KNEE ARTHROSCOPY, MEDIAL MENISCAL ROOT REPAIR  SURGEON:  Surgeon(s): Cammy Copa, MD  ASSISTANT: magnant pa  ANESTHESIA:   general  EBL: 2 ml    Total I/O In: 1050 [I.V.:1000; IV Piggyback:50] Out: -   BLOOD ADMINISTERED: none  DRAINS: none   LOCAL MEDICATIONS USED:  Marcaine mso4 clonidine  SPECIMEN:  No Specimen  COUNTS:  YES  TOURNIQUET:  * Missing tourniquet times found for documented tourniquets in log: [id number] *  DICTATION: .Other Dictation: Dictation Number [id number]  PLAN OF CARE: Discharge to home after PACU  PATIENT DISPOSITION:  PACU - hemodynamically stable

## 2020-03-09 NOTE — H&P (Signed)
[name] [name] is an 49 y.o. female.   Chief Complaint: Left knee pain HPI: [name] [name] is a 49 year old female with left knee pain.  She felt a pop in her knee within the past several months.  MRI scan suggested partial versus complete meniscal root evulsion.  Had similar injury on the right-hand side and underwent arthroscopy and debridement with general improvement in symptoms.  Presents now after failure of nonoperative management and explanation of risks and benefits.  No personal or family history of DVT or pulmonary embolism.  Past Medical History:  Diagnosis Date  . Anemia   . Anxiety   . Arthritis    "left knee"  . Chronic lower back pain    "on the left side"  . Headache(784.0) 02/29/12   "frequent"  . Hypertension   . Left knee pain 11/04/2015  . Migraines   . Pneumonia 2006  . Renal insufficiency 06/18/2019  . Sleep apnea    CPAP  . Stress at home    "and at work"    Past Surgical History:  Procedure Laterality Date  . BREAST BIOPSY Left 07/29/2013  . CESAREAN SECTION  08/2008  . CHOLECYSTECTOMY  03/02/2012   Procedure: LAPAROSCOPIC CHOLECYSTECTOMY;  Surgeon: Cherylynn Ridges, MD;  Location: [facility] OR;  Service: General;;  . KNEE ARTHROSCOPY WITH MENISCAL REPAIR Right 06/25/2018   Procedure: RIGHT KNEE ARTHROSCOPY WITH MENISCAL ROOT REPAIR;  Surgeon: Cammy Copa, MD;  Location: [facility] OR;  Service: Orthopedics;  Laterality: Right;  . WISDOM TOOTH EXTRACTION      Family History  Problem Relation Age of Onset  . Cancer Other    Social History:  reports that she has never smoked. She has never used smokeless tobacco. She reports that she does not drink alcohol and does not use drugs.  Allergies:  Allergies  Allergen Reactions  . Mushroom Extract Complex Hives and Itching    Mushroom as a whole the pt is allergic to.    Medications Prior to Admission  Medication Sig Dispense Refill  . albuterol (PROVENTIL HFA) 108 (90 Base) MCG/ACT inhaler Inhale 1-2 puffs into the lungs  every 6 (six) hours as needed for wheezing or shortness of breath. 18 g 0  . cyclobenzaprine (FLEXERIL) 5 MG tablet TAKE 1 TABLET BY MOUTH DAILY FOR MUSCLE SPASM (Patient taking differently: Take 5 mg by mouth daily as needed for muscle spasms. Take 1 tablet by mouth daily for muscle spasm) 30 tablet 0  . DULoxetine (CYMBALTA) 60 MG capsule Take 1 capsule (60 mg total) by mouth daily. 90 capsule 0  . gabapentin (NEURONTIN) 300 MG capsule Take 1-3 capsules (300-900 mg total) by mouth See admin instructions. Take 1 capsule every morning and take 3 capsules at bedtime (Patient taking differently: Take 300-900 mg by mouth See admin instructions. Take 300 mg  capsule every morning and take 900 mg capsules at bedtime) 360 capsule 1  . lisinopril (ZESTRIL) 10 MG tablet Take 1 tablet (10 mg total) by mouth daily. 90 tablet 3  . benzonatate (TESSALON) 100 MG capsule Take 1 capsule (100 mg total) by mouth every 8 (eight) hours. (Patient not taking: Reported on 06/18/2019) 21 capsule 0  . fluticasone (FLONASE) 50 MCG/ACT nasal spray Place 2 sprays into both nostrils daily. (Patient taking differently: Place 1 spray into both nostrils daily as needed. ) 16 g 0  . Liraglutide -Weight Management (SAXENDA) 18 MG/3ML SOPN Initial: 0.6 mg once daily for 1 week; increase by 0.6 mg daily at weekly  intervals to a target dose of 3 mg once daily (Patient not taking: Reported on 03/01/2020) 5 pen 2  . meloxicam (MOBIC) 7.5 MG tablet Take 1 tablet (7.5 mg total) by mouth daily. (Patient not taking: Reported on 03/01/2020) 10 tablet 0  . ondansetron (ZOFRAN ODT) 4 MG disintegrating tablet Take 1 tablet (4 mg total) by mouth every 8 (eight) hours as needed for nausea or vomiting. (Patient not taking: Reported on 06/18/2019) 20 tablet 0    Results for orders placed or performed during the hospital encounter of 03/09/20 (from the past 48 hour(s))  Pregnancy, urine POC     Status: None   Collection Time: 03/09/20 12:32 PM  Result Value  Ref Range   Preg Test, Ur NEGATIVE NEGATIVE    Comment:        THE SENSITIVITY OF THIS METHODOLOGY IS >24 mIU/mL    No results found.  Review of Systems  Musculoskeletal: Positive for arthralgias.  All other systems reviewed and are negative.   Blood pressure 122/69, pulse 74, temperature 98.8 F (37.1 C), temperature source Oral, resp. rate 14, height 5' (1.524 m), weight 119.3 kg, SpO2 100 %. Physical Exam  Vitals reviewed. HENT:  Head: Normocephalic.  Nose: Nose normal.  Mouth/Throat: Mucous membranes are moist.  Eyes: Pupils are equal, round, and reactive to light.  Cardiovascular: Normal rate and normal pulses.  Respiratory: Effort normal.  Musculoskeletal:     Cervical back: Normal range of motion.  Neurological: She is alert.  Skin: Skin is warm. Capillary refill takes less than 2 seconds.  Psychiatric: Mood normal.    Examination of the left knee demonstrates mild effusion with medial joint line tenderness.  Collateral crucial ligaments are stable.  Range of motion is good.  Easily past 90 degrees of flexion with full extension.  Pedal pulses palpable. Assessment/Plan Impression is left knee medial meniscal tear with partial versus complete root tear.  Plan is for arthroscopy with debridement versus meniscal root repair.  Patient has very high body mass index which would make repair even if feasible unlikely to hold up.  This is all discussed with the patient.  Essentially the same situation we had back in October prior to her right knee surgery.  Patient understands risk and benefits including not limited to infection nerve vessel damage inability to repair the meniscal root tear as well as inability or failure of the meniscal root tear itself meniscal root tear repair itself.  Patient understands risk benefits.  All questions answered.  Anderson Malta, MD 03/09/2020, 3:08 PM

## 2020-03-10 ENCOUNTER — Encounter (HOSPITAL_COMMUNITY): Payer: Self-pay | Admitting: Orthopedic Surgery

## 2020-03-10 ENCOUNTER — Encounter: Payer: Self-pay | Admitting: Internal Medicine

## 2020-03-10 NOTE — Progress Notes (Signed)
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

## 2020-03-10 NOTE — Anesthesia Postprocedure Evaluation (Signed)
Anesthesia Post Note  Patient: [name] [name]  Procedure(s) Performed: LEFT KNEE ARTHROSCOPY, MEDIAL MENISCAL ROOT REPAIR (Left Knee)     Patient location during evaluation: Other Anesthesia Type: General and Regional Level of consciousness: awake and alert Pain management: pain level controlled Vital Signs Assessment: post-procedure vital signs reviewed and stable Respiratory status: spontaneous breathing, nonlabored ventilation and respiratory function stable Cardiovascular status: blood pressure returned to baseline and stable Postop Assessment: no apparent nausea or vomiting Anesthetic complications: no   No complications documented.  Last Vitals:  Vitals:   03/09/20 1755 03/09/20 1815  BP: 113/66   Pulse: 88   Resp: 17   Temp: 36.6 C   SpO2: 94% 95%    Last Pain:  Vitals:   03/09/20 1755  TempSrc:   PainSc: 0-No pain                 Iridian Reader,W. EDMOND

## 2020-03-17 ENCOUNTER — Ambulatory Visit (INDEPENDENT_AMBULATORY_CARE_PROVIDER_SITE_OTHER): Payer: Medicaid Other | Admitting: Orthopedic Surgery

## 2020-03-17 DIAGNOSIS — S838X2A Sprain of other specified parts of left knee, initial encounter: Secondary | ICD-10-CM

## 2020-03-19 ENCOUNTER — Encounter: Payer: Self-pay | Admitting: Orthopedic Surgery

## 2020-03-19 NOTE — Progress Notes (Signed)
Post-Op Visit Note   Patient: [name] [name]           Date of Birth: 1971/02/22           MRN: [id number] Visit Date: 03/17/2020 PCP: Alphonzo Severance, MD   Assessment & Plan:  Chief Complaint:  Chief Complaint  Patient presents with  . Left Knee - Routine Post Op   Visit Diagnoses:  1. Injury of meniscus of left knee, initial encounter     Plan: Patient is a 49 year old female presents s/p left knee arthroscopy with medial meniscal debridement on 03/09/2020.  She is full weightbearing with a cane.  She normally walks without a cane.  She notes continued pain in the morning.  She is minorly improved compared with preop.  She states that her pain feels somewhat different.  She does note some stiffness in the knee that is worse in the morning.  Sutures are intact and incisions are healing well.  She has 5 degrees of extension and 90 degrees of flexion.  She does have an effusion.  Sutures were removed and replaced with Steri-Strips.  She has no calf tenderness and a negative Denna Haggard' sign today.  Recommended that she use Chloraseptic for her sore throat which she can get over-the-counter.  Follow-up in 4 weeks for clinical recheck.  Follow-Up Instructions: No follow-ups on file.   Orders:  No orders of the defined types were placed in this encounter.  No orders of the defined types were placed in this encounter.   Imaging: No results found.  PMFS History: Patient Active Problem List   Diagnosis Date Noted  . Left knee injury, subsequent encounter 01/14/2020  . Carpal tunnel syndrome 10/23/2019  . Trigger finger, right 08/14/2019  . 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 Pneumonia due to COVID-19 virus 06/18/2019  . Acute respiratory failure with hypoxia (HCC) 06/18/2019  . Renal insufficiency 06/18/2019  . Hyperkalemia 10/30/2018  . Posterior knee pain, right 02/21/2018  . Essential hypertension 01/30/2018  . Mass on back 01/30/2018  . Cervical radiculopathy 04/30/2017  .  Left wrist pain 11/28/2016  . Pre-diabetes 02/16/2016  . OSA (obstructive sleep apnea) 05/14/2015  . Chronic lumbar radiculopathy 11/03/2014  . Other allergic rhinitis 09/07/2014  . Health care maintenance 09/07/2014  . OBESITY, MORBID 09/20/2006  . Anemia, iron deficiency 08/09/2006   Past Medical History:  Diagnosis Date  . Anemia   . Anxiety   . Arthritis    "left knee"  . Chronic lower back pain    "on the left side"  . Headache(784.0) 02/29/12   "frequent"  . Hypertension   . Left knee pain 11/04/2015  . Migraines   . Pneumonia 2006  . Renal insufficiency 06/18/2019  . Sleep apnea    CPAP  . Stress at home    "and at work"    Family History  Problem Relation Age of Onset  . Cancer Other     Past Surgical History:  Procedure Laterality Date  . BREAST BIOPSY Left 07/29/2013  . CESAREAN SECTION  08/2008  . CHOLECYSTECTOMY  03/02/2012   Procedure: LAPAROSCOPIC CHOLECYSTECTOMY;  Surgeon: Cherylynn Ridges, MD;  Location[facility] OR;  Service: General;;  . KNEE ARTHROSCOPY WITH MENISCAL REPAIR Right 06/25/2018   Procedure: RIGHT KNEE ARTHROSCOPY WITH MENISCAL ROOT REPAIR;  Surgeon: Cammy Copa, MD;  Location: [facility] OR;  Service: Orthopedics;  Laterality: Right;  . KNEE ARTHROSCOPY WITH MENISCAL REPAIR Left 03/09/2020   Procedure: LEFT KNEE ARTHROSCOPY, MEDIAL MENISCAL ROOT  REPAIR;  Surgeon: Meredith Pel, MD;  Location: [place];  Service: Orthopedics;  Laterality: Left;  . WISDOM TOOTH EXTRACTION     Social History   Occupational History  . Not on file  Tobacco Use  . Smoking status: Never Smoker  . Smokeless tobacco: Never Used  Vaping Use  . Vaping Use: Never used  Substance and Sexual Activity  . Alcohol use: No    Alcohol/week: 0.0 standard drinks  . Drug use: No  . Sexual activity: Not on file

## 2020-03-20 ENCOUNTER — Other Ambulatory Visit: Payer: Self-pay | Admitting: Student in an Organized Health Care Education/Training Program

## 2020-03-20 DIAGNOSIS — M5416 Radiculopathy, lumbar region: Secondary | ICD-10-CM

## 2020-03-22 MED ORDER — CYCLOBENZAPRINE HCL 5 MG PO TABS: ORAL_TABLET | ORAL | 0 refills | Status: DC

## 2020-03-25 DIAGNOSIS — Z419 Encounter for procedure for purposes other than remedying health state, unspecified: Secondary | ICD-10-CM | POA: Diagnosis not present

## 2020-04-14 ENCOUNTER — Ambulatory Visit (INDEPENDENT_AMBULATORY_CARE_PROVIDER_SITE_OTHER): Payer: Medicaid Other | Admitting: Orthopedic Surgery

## 2020-04-14 DIAGNOSIS — S838X2A Sprain of other specified parts of left knee, initial encounter: Secondary | ICD-10-CM

## 2020-04-17 ENCOUNTER — Encounter: Payer: Self-pay | Admitting: Orthopedic Surgery

## 2020-04-17 NOTE — Progress Notes (Signed)
Post-Op Visit Note   Patient: [name] LEMBERGER           Date of Birth: 09-Sep-1971           MRN: [id number] Visit Date: 04/14/2020 PCP: Alphonzo Severance, MD   Assessment & Plan:  Chief Complaint:  Chief Complaint  Patient presents with  . Left Knee - Routine Post Op   Visit Diagnoses:  1. Injury of meniscus of left knee, initial encounter     Plan: Patient is a 49 year old female who presents s/p left knee arthroscopy with medial meniscal debridement on 03/09/2020.  She states that she is doing much better with her left knee pain compared with preoperatively.  She is ambulating with a cane and doing a home exercise program.  She has 0 degrees of extension and 115 degrees of flexion.  Incisions have healed well.  She is taking muscle relaxer with gabapentin and duloxetine.  She is not taking oxycodone.  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  This involves standing for 4 to 4.5 hours at a time.  Recommended that she continue with nonload bearing quadricep exercises.  She is negative by joining the [facility] to pursue pool workouts.  Provided work note for patient to return to work starting next Monday.  She will get a 5-minute sit down break every hour for the next 4 weeks.  Follow-up in 4 weeks for clinical recheck.  Patient agreed with plan.  Follow-Up Instructions: No follow-ups on file.   Orders:  No orders of the defined types were placed in this encounter.  No orders of the defined types were placed in this encounter.   Imaging: No results found.  PMFS History: Patient Active Problem List   Diagnosis Date Noted  . Left knee injury, subsequent encounter 01/14/2020  . Carpal tunnel syndrome 10/23/2019  . Trigger finger, right 08/14/2019  . 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 Pneumonia due to COVID-19 virus 06/18/2019  . Acute respiratory failure with hypoxia (HCC) 06/18/2019  . Renal insufficiency 06/18/2019  . Hyperkalemia 10/30/2018  . Posterior knee pain, right  02/21/2018  . Essential hypertension 01/30/2018  . Mass on back 01/30/2018  . Cervical radiculopathy 04/30/2017  . Left wrist pain 11/28/2016  . Pre-diabetes 02/16/2016  . OSA (obstructive sleep apnea) 05/14/2015  . Chronic lumbar radiculopathy 11/03/2014  . Other allergic rhinitis 09/07/2014  . Health care maintenance 09/07/2014  . OBESITY, MORBID 09/20/2006  . Anemia, iron deficiency 08/09/2006   Past Medical History:  Diagnosis Date  . Anemia   . Anxiety   . Arthritis    "left knee"  . Chronic lower back pain    "on the left side"  . Headache(784.0) 02/29/12   "frequent"  . Hypertension   . Left knee pain 11/04/2015  . Migraines   . Pneumonia 2006  . Renal insufficiency 06/18/2019  . Sleep apnea    CPAP  . Stress at home    "and at work"    Family History  Problem Relation Age of Onset  . Cancer Other     Past Surgical History:  Procedure Laterality Date  . BREAST BIOPSY Left 07/29/2013  . CESAREAN SECTION  08/2008  . CHOLECYSTECTOMY  03/02/2012   Procedure: LAPAROSCOPIC CHOLECYSTECTOMY;  Surgeon: Cherylynn Ridges, MD;  Location: [facility] OR;  Service: General;;  . KNEE ARTHROSCOPY WITH MENISCAL REPAIR Right 06/25/2018   Procedure: RIGHT KNEE ARTHROSCOPY WITH MENISCAL ROOT REPAIR;  Surgeon: Cammy Copa, MD;  Location:  [facility] OR;  Service: Orthopedics;  Laterality: Right;  . KNEE ARTHROSCOPY WITH MENISCAL REPAIR Left 03/09/2020   Procedure: LEFT KNEE ARTHROSCOPY, MEDIAL MENISCAL ROOT REPAIR;  Surgeon: Cammy Copa, MD;  Location: [facility] OR;  Service: Orthopedics;  Laterality: Left;  . WISDOM TOOTH EXTRACTION     Social History   Occupational History  . Not on file  Tobacco Use  . Smoking status: Never Smoker  . Smokeless tobacco: Never Used  Vaping Use  . Vaping Use: Never used  Substance and Sexual Activity  . Alcohol use: No    Alcohol/week: 0.0 standard drinks  . Drug use: No  . Sexual activity: Not on file

## 2020-04-25 DIAGNOSIS — Z419 Encounter for procedure for purposes other than remedying health state, unspecified: Secondary | ICD-10-CM | POA: Diagnosis not present

## 2020-05-11 ENCOUNTER — Other Ambulatory Visit: Payer: Self-pay | Admitting: Surgical

## 2020-05-11 ENCOUNTER — Other Ambulatory Visit: Payer: Self-pay | Admitting: Internal Medicine

## 2020-05-11 ENCOUNTER — Other Ambulatory Visit: Payer: Self-pay

## 2020-05-11 DIAGNOSIS — M5416 Radiculopathy, lumbar region: Secondary | ICD-10-CM

## 2020-05-11 MED ORDER — CYCLOBENZAPRINE HCL 5 MG PO TABS: ORAL_TABLET | ORAL | 0 refills | Status: DC

## 2020-05-11 MED ORDER — DULOXETINE HCL 60 MG PO CPEP: 60.0000 mg | ORAL_CAPSULE | Freq: Every day | ORAL | 0 refills | Status: DC

## 2020-05-11 MED ORDER — ASPIRIN EC 81 MG PO TBEC: 81.0000 mg | DELAYED_RELEASE_TABLET | Freq: Every day | ORAL | 0 refills | Status: DC

## 2020-05-12 ENCOUNTER — Ambulatory Visit (INDEPENDENT_AMBULATORY_CARE_PROVIDER_SITE_OTHER): Payer: Medicaid Other | Admitting: Orthopedic Surgery

## 2020-05-12 DIAGNOSIS — S838X2A Sprain of other specified parts of left knee, initial encounter: Secondary | ICD-10-CM

## 2020-05-12 DIAGNOSIS — M25562 Pain in left knee: Secondary | ICD-10-CM

## 2020-05-12 DIAGNOSIS — M1712 Unilateral primary osteoarthritis, left knee: Secondary | ICD-10-CM

## 2020-05-12 DIAGNOSIS — S838X1D Sprain of other specified parts of right knee, subsequent encounter: Secondary | ICD-10-CM

## 2020-05-12 MED ORDER — HYDROCODONE-ACETAMINOPHEN 5-325 MG PO TABS: ORAL_TABLET | ORAL | 0 refills | Status: DC

## 2020-05-12 MED ORDER — MELOXICAM 15 MG PO TABS
15.0000 mg | ORAL_TABLET | Freq: Every day | ORAL | 2 refills | Status: DC
Start: 2020-05-12 — End: 2020-06-25

## 2020-05-13 ENCOUNTER — Telehealth: Payer: Self-pay | Admitting: Orthopedic Surgery

## 2020-05-13 NOTE — Telephone Encounter (Signed)
Patient called.   Was a little confused about how it will work getting her hydrocodone but I was able to clarify it for her.

## 2020-05-14 DIAGNOSIS — G4733 Obstructive sleep apnea (adult) (pediatric): Secondary | ICD-10-CM | POA: Diagnosis not present

## 2020-05-15 ENCOUNTER — Encounter: Payer: Self-pay | Admitting: Orthopedic Surgery

## 2020-05-15 NOTE — Progress Notes (Signed)
Post-Op Visit Note   Patient: [name] [name]           Date of Birth: [birth year]           MRN: [id number] Visit Date: 05/12/2020 PCP: Alphonzo Severance, MD   Assessment & Plan:  Chief Complaint:  Chief Complaint  Patient presents with  . Post-op Follow-up   Visit Diagnoses: No diagnosis found.  Plan: [name] is a 49 year old female underwent left knee arthroscopy with medial meniscal debridement 03/09/2020.  Doing some better.  Ambulating with a cane.  No physical therapy yet.  States oxycodone is not giving her relief.  She also is taking muscle relaxer and gabapentin.  Works at [employer] in [employer].  On exam she has excellent range of motion no effusion improving quad strength.  I am going to refill her Norco 1 time only 1 p.o. nightly #20 with no refills.  Also she needs to take some Mobic at least for a month to help decrease inflammation.  Quad strengthening exercises encouraged.  I think she is okay to return to work as well within 4 weeks.  I think is not good to be easy but I think it is possible.  No calf tenderness negative Homans today.  Follow-Up Instructions: No follow-ups on file.   Orders:  No orders of the defined types were placed in this encounter.  Meds ordered this encounter  Medications  . meloxicam (MOBIC) 15 MG tablet    Sig: Take 1 tablet (15 mg total) by mouth daily.    Dispense:  30 tablet    Refill:  2  . HYDROcodone-acetaminophen (NORCO/VICODIN) 5-325 MG tablet    Sig: 1 po q hs prn    Dispense:  20 tablet    Refill:  0    Imaging: No results found.  PMFS History: Patient Active Problem List   Diagnosis Date Noted  . Left knee injury, subsequent encounter 01/14/2020  . Carpal tunnel syndrome 10/23/2019  . Trigger finger, right 08/14/2019  . 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 Pneumonia due to COVID-19 virus 06/18/2019  . Acute respiratory failure with hypoxia (HCC) 06/18/2019  . Renal insufficiency 06/18/2019  . Hyperkalemia 10/30/2018   . Posterior knee pain, right 02/21/2018  . Essential hypertension 01/30/2018  . Mass on back 01/30/2018  . Cervical radiculopathy 04/30/2017  . Left wrist pain 11/28/2016  . Pre-diabetes 02/16/2016  . OSA (obstructive sleep apnea) 05/14/2015  . Chronic lumbar radiculopathy 11/03/2014  . Other allergic rhinitis 09/07/2014  . Health care maintenance 09/07/2014  . OBESITY, MORBID 09/20/2006  . Anemia, iron deficiency 08/09/2006   Past Medical History:  Diagnosis Date  . Anemia   . Anxiety   . Arthritis    "left knee"  . Chronic lower back pain    "on the left side"  . Headache(784.0) 02/29/12   "frequent"  . Hypertension   . Left knee pain 11/04/2015  . Migraines   . Pneumonia 2006  . Renal insufficiency 06/18/2019  . Sleep apnea    CPAP  . Stress at home    "and at work"    Family History  Problem Relation Age of Onset  . Cancer Other     Past Surgical History:  Procedure Laterality Date  . BREAST BIOPSY Left 07/29/2013  . CESAREAN SECTION  08/2008  . CHOLECYSTECTOMY  03/02/2012   Procedure: LAPAROSCOPIC CHOLECYSTECTOMY;  Surgeon: Cherylynn Ridges, MD;  Location: [facility] OR;  Service: General;;  . KNEE ARTHROSCOPY WITH MENISCAL REPAIR  Right 06/25/2018   Procedure: RIGHT KNEE ARTHROSCOPY WITH MENISCAL ROOT REPAIR;  Surgeon: Cammy Copa, MD;  Location: [facility] OR;  Service: Orthopedics;  Laterality: Right;  . KNEE ARTHROSCOPY WITH MENISCAL REPAIR Left 03/09/2020   Procedure: LEFT KNEE ARTHROSCOPY, MEDIAL MENISCAL ROOT REPAIR;  Surgeon: Cammy Copa, MD;  Location: [facility] OR;  Service: Orthopedics;  Laterality: Left;  . WISDOM TOOTH EXTRACTION     Social History   Occupational History  . Not on file  Tobacco Use  . Smoking status: Never Smoker  . Smokeless tobacco: Never Used  Vaping Use  . Vaping Use: Never used  Substance and Sexual Activity  . Alcohol use: No    Alcohol/week: 0.0 standard drinks  . Drug use: No  . Sexual activity: Not on file

## 2020-05-26 DIAGNOSIS — Z419 Encounter for procedure for purposes other than remedying health state, unspecified: Secondary | ICD-10-CM | POA: Diagnosis not present

## 2020-05-28 IMAGING — MG DIGITAL SCREENING BILAT W/ CAD
4 series · 4 of 4 positions shown · non-contrast
Comparison: Previous exam(s).

ACR Breast Density Category a: The breast tissue is almost entirely
fatty.

CLINICAL DATA: Screening.

EXAM:
DIGITAL SCREENING BILATERAL MAMMOGRAM WITH CAD

[R CC]
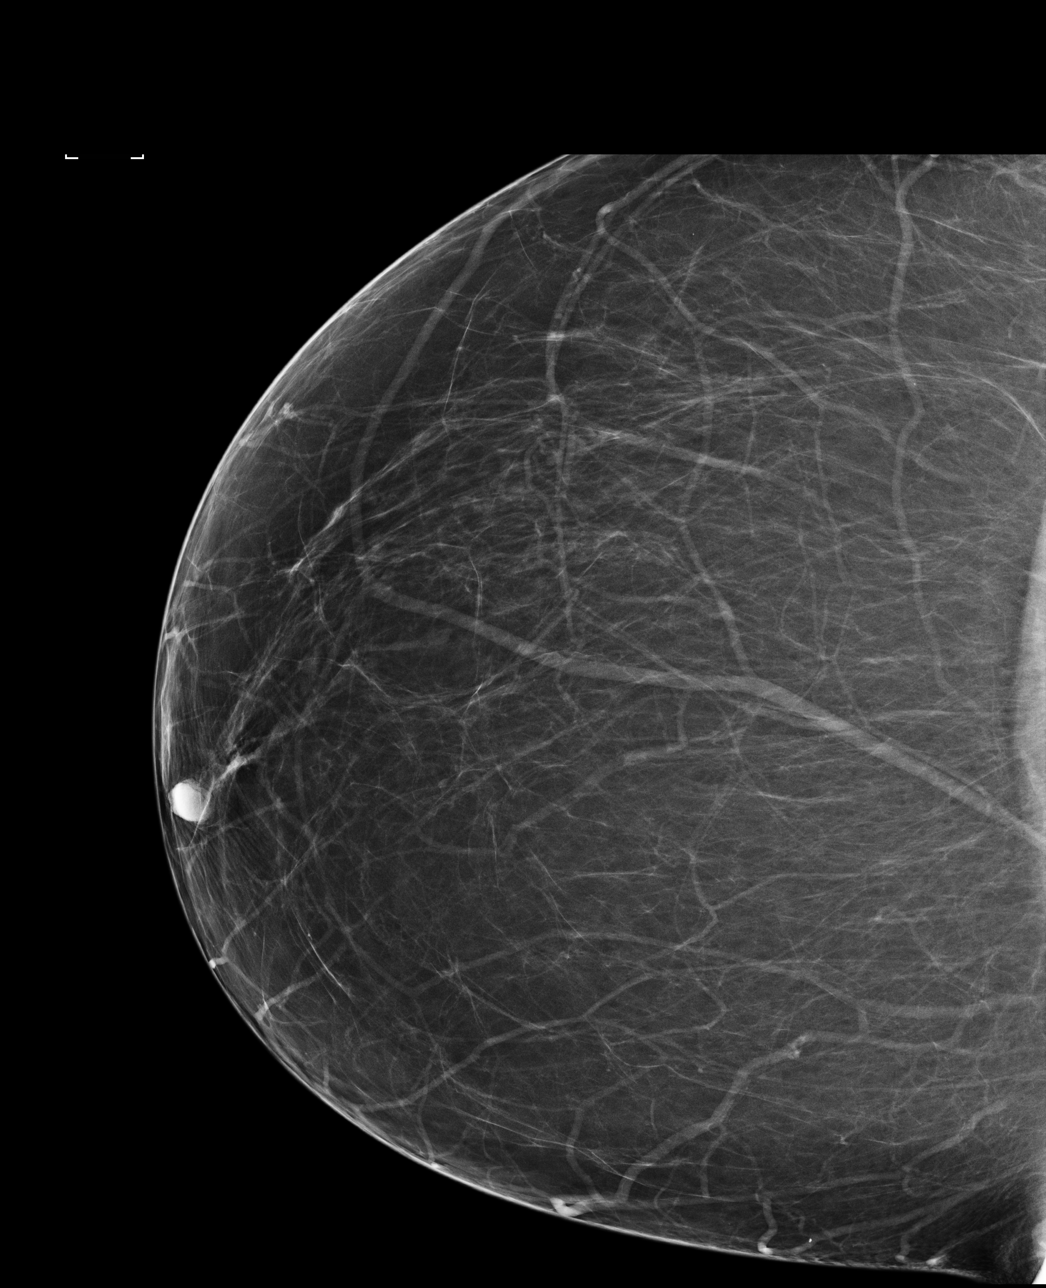

[L MLO]
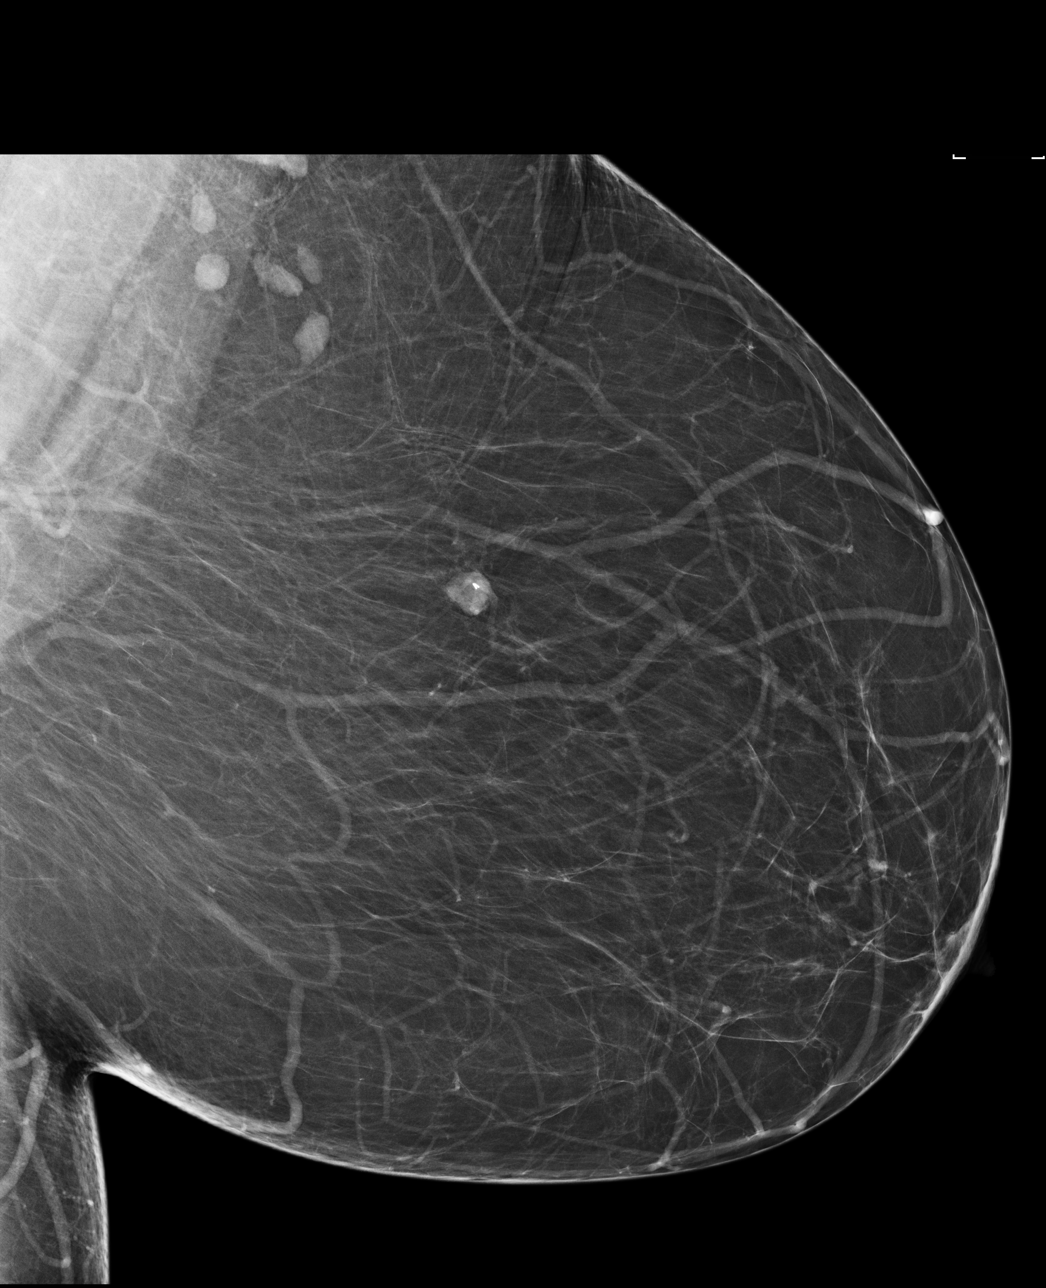

[L CC]
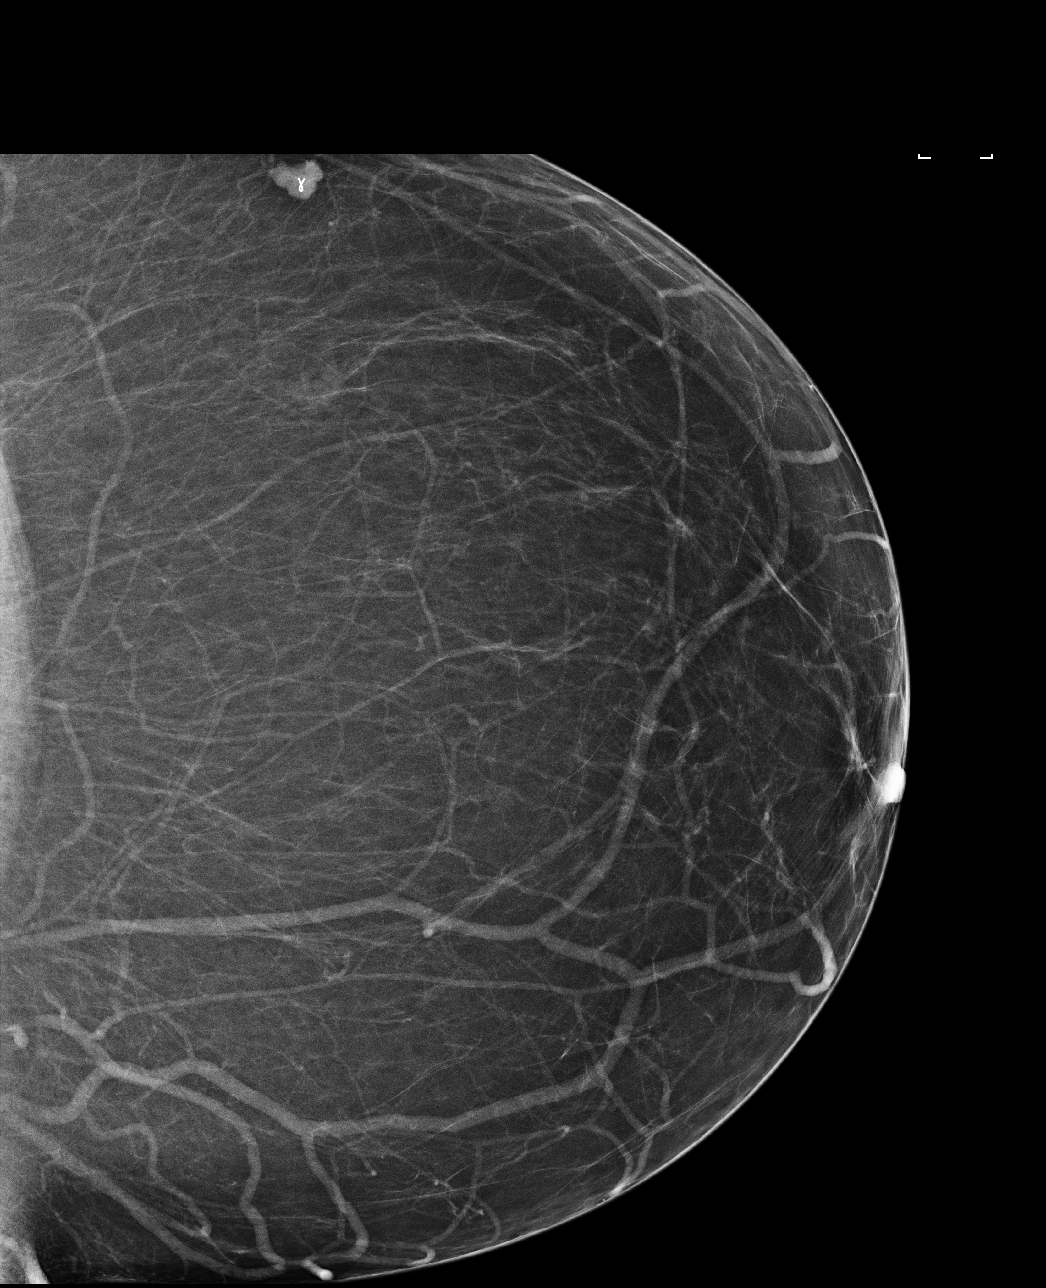

[R MLO]
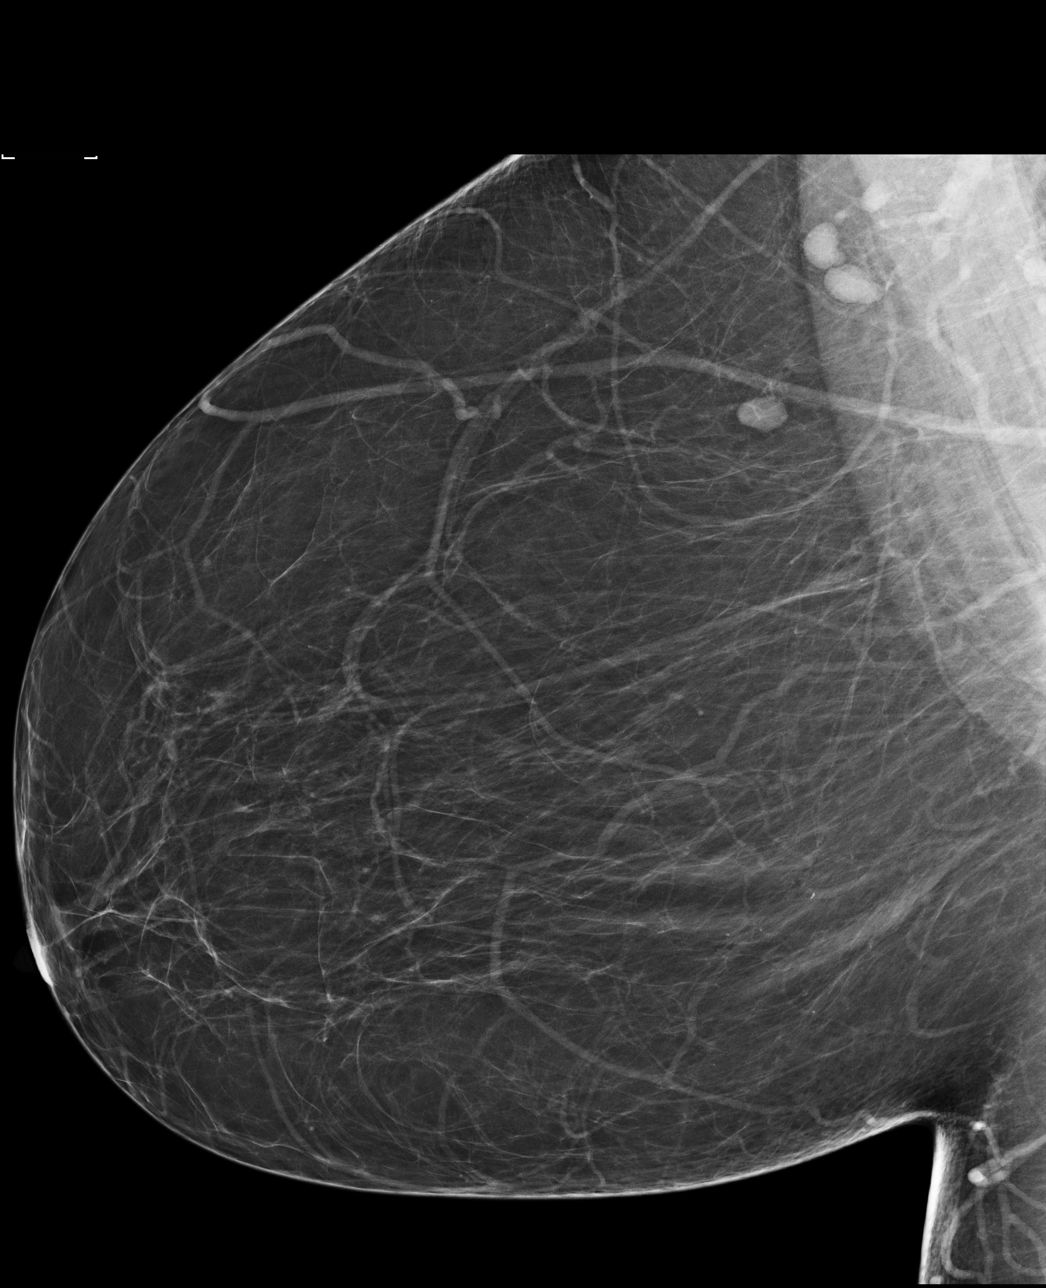

[4 of 4 positions shown; findings below may reference images not displayed]

FINDINGS: There are no findings suspicious for malignancy. Images were
processed with CAD.
IMPRESSION: No mammographic evidence of malignancy. A result letter of this
screening mammogram will be mailed directly to the patient.

RECOMMENDATION:
Screening mammogram in one year. (Code:[id number])

BI-RADS CATEGORY  1: Negative.

## 2020-06-20 ENCOUNTER — Other Ambulatory Visit: Payer: Self-pay | Admitting: Internal Medicine

## 2020-06-22 ENCOUNTER — Other Ambulatory Visit: Payer: Self-pay | Admitting: Orthopedic Surgery

## 2020-06-22 ENCOUNTER — Other Ambulatory Visit: Payer: Self-pay

## 2020-06-22 ENCOUNTER — Other Ambulatory Visit: Payer: Self-pay | Admitting: Surgical

## 2020-06-23 MED ORDER — ALBUTEROL SULFATE HFA 108 (90 BASE) MCG/ACT IN AERS: 1.0000 | INHALATION_SPRAY | Freq: Four times a day (QID) | RESPIRATORY_TRACT | 0 refills | Status: DC | PRN

## 2020-06-23 NOTE — Telephone Encounter (Signed)
I think she is too far out from procedure for a refill of pain med with surgery on 6/15.  Can try a different antiinflammatory if her mobic is not helping her

## 2020-06-23 NOTE — Telephone Encounter (Signed)
Please forward to the prescribing provider. Last note from orthopedics indicates no further refills so they can decide if refills are appropriate at this time.

## 2020-06-23 NOTE — Telephone Encounter (Signed)
Please advise. Thanks.  

## 2020-06-24 NOTE — Telephone Encounter (Signed)
Pls advise.  

## 2020-06-24 NOTE — Telephone Encounter (Signed)
Hi Lauren can you call her in Celebrex 100 mg p.o. twice a day for 14 days and tramadol 1 p.o. nightly as needed pain #30 thanks

## 2020-06-25 ENCOUNTER — Other Ambulatory Visit: Payer: Self-pay | Admitting: Surgical

## 2020-06-25 ENCOUNTER — Telehealth: Payer: Self-pay | Admitting: *Deleted

## 2020-06-25 DIAGNOSIS — Z419 Encounter for procedure for purposes other than remedying health state, unspecified: Secondary | ICD-10-CM | POA: Diagnosis not present

## 2020-06-25 MED ORDER — CELECOXIB 100 MG PO CAPS
100.0000 mg | ORAL_CAPSULE | Freq: Two times a day (BID) | ORAL | 0 refills | Status: DC
Start: 2020-06-25 — End: 2020-10-01

## 2020-06-25 MED ORDER — TRAMADOL HCL 50 MG PO TABS: 50.0000 mg | ORAL_TABLET | Freq: Every evening | ORAL | 0 refills | Status: AC | PRN

## 2020-06-25 NOTE — Telephone Encounter (Addendum)
Information was sent through CoverMyMeds for PA for Albuterol Sulfate HFA.  Awaiting determination from [facility] within 24 hours.   PA was denied.  Patientt will need to  Use Formulary drug of ProAir. Dispence quanity of 9 for 25 days with refills per the Pharmacist at [facility].  Message to be sent to the [facility] Team to consider a change.Angelina Ok, RN 06/29/2020 11:01 AM.

## 2020-06-25 NOTE — Telephone Encounter (Signed)
Sent in

## 2020-06-29 ENCOUNTER — Ambulatory Visit: Payer: Medicaid Other | Attending: Internal Medicine

## 2020-06-29 DIAGNOSIS — Z23 Encounter for immunization: Secondary | ICD-10-CM

## 2020-06-29 MED ORDER — ALBUTEROL SULFATE HFA 108 (90 BASE) MCG/ACT IN AERS: 1.0000 | INHALATION_SPRAY | Freq: Four times a day (QID) | RESPIRATORY_TRACT | 2 refills | Status: DC | PRN

## 2020-06-29 NOTE — Progress Notes (Signed)
   Covid-19 Vaccination Clinic  Name:  [name] [name]    MRN: [id number] DOB: 01-15-1971  06/29/2020  [name] was observed post Covid-19 immunization for 15 minutes without incident. She was provided with Vaccine Information Sheet and instruction to access the V-Safe system.   [name] was instructed to call 911 with any severe reactions post vaccine: Marland Kitchen Difficulty breathing  . Swelling of face and throat  . A fast heartbeat  . A bad rash all over body  . Dizziness and weakness

## 2020-07-07 ENCOUNTER — Ambulatory Visit (INDEPENDENT_AMBULATORY_CARE_PROVIDER_SITE_OTHER): Payer: Medicaid Other | Admitting: Internal Medicine

## 2020-07-07 ENCOUNTER — Encounter: Payer: Self-pay | Admitting: Internal Medicine

## 2020-07-07 ENCOUNTER — Other Ambulatory Visit: Payer: Self-pay

## 2020-07-07 VITALS — BP 127/79 | HR 78 | Temp 98.0°F | Ht 63.0 in | Wt 276.2 lb

## 2020-07-07 DIAGNOSIS — E669 Obesity, unspecified: Secondary | ICD-10-CM | POA: Diagnosis not present

## 2020-07-07 DIAGNOSIS — Z23 Encounter for immunization: Secondary | ICD-10-CM

## 2020-07-07 DIAGNOSIS — R631 Polydipsia: Secondary | ICD-10-CM | POA: Diagnosis not present

## 2020-07-07 DIAGNOSIS — R7303 Prediabetes: Secondary | ICD-10-CM | POA: Diagnosis not present

## 2020-07-07 DIAGNOSIS — R5383 Other fatigue: Secondary | ICD-10-CM | POA: Diagnosis not present

## 2020-07-07 DIAGNOSIS — R0601 Orthopnea: Secondary | ICD-10-CM | POA: Diagnosis not present

## 2020-07-07 LAB — POCT GLYCOSYLATED HEMOGLOBIN (HGB A1C): Hemoglobin A1C: 6.7 % — AB (ref 4.0–5.6)

## 2020-07-07 LAB — GLUCOSE, CAPILLARY: Glucose-Capillary: 88 mg/dL (ref 70–99)

## 2020-07-07 LAB — BRAIN NATRIURETIC PEPTIDE: B Natriuretic Peptide: 25.5 pg/mL (ref 0.0–100.0)

## 2020-07-07 NOTE — Assessment & Plan Note (Addendum)
HPI: Pt notes 2 month history of night time cough, shortness of breath, and sense of choking at night. Cough is characterized as dry. The shortness of breath is alleviated with sitting up but is not changed by laying supine vs left or right side. She denies wheezing or chest pain.  She notes some lower extremity swelling which may be worse than previously. She also implies a recent decrease in exercise tolerance and now gets short of breath after walking up a flight of stairs or down a hallway.  She uses her albuterol inhaler sometimes at night which she says only minimally improves her symptoms. Denies nasal drainage or seasonal allergies.  Denies personal current or history of tobacco use.  History significant for orthopnea for which she uses a CPAP Assessment: primary differentials for her symptoms include new onset heart failure vs uncontrolled asthma.  -Her symptoms favor heart failure--orthopnea, choking sensation, improvement with sitting up. Unfortunately, due to her body habitus, heart sounds were difficult to auscultate and JVD difficult due to neck circumference. She does have +1 lnon pitting lower extremity edema. She was recently started on celebrex which could worsen heart failure and kidney function.  -The lack of wheezing or chest tightness make uncontrolled asthma less likely.  -I did also consider lung scarring from COVID 19 last year however symptoms are less consistent with this. -I considered post nasal drip however history and PE findings not consistent with this -I also considered combination OSA and OHS. OSA previously diagnosed and she has been wearing her CPAP however if the diagnosis of OHS were found, this may indicate the need for BiPAP to compensate for the restrictive process. Plan --echocardiogram ordered. Although heart failure is a clinical diagnosis, her body habitus precludes an adequate physical exam. BNP ordered. --f/u after echocardiogram to review results and  discuss further management. --if suspicion for heart failure is lowered and symptoms persist, can consider obtaining PFTs to evaluate for asthma.  Addendum #1 07/08/20 3:22 PM: BNP 25 however may be falsely lowered in the setting of obesity. Will continue to await results of echocardiogram.

## 2020-07-07 NOTE — Assessment & Plan Note (Addendum)
This is an initial OV for this problem.  Assessment: unclear etiology at this time. Thyroid dysfunction considered--TSH wnl in 2020. Considered medication effects however no particular medications on her list are pointing to this. Considered hyperglycemia induced--no history of DM. Plan:  --TSH --A1C and UA to evaluate for new onset DM or glucosuria respectively  Addendum #1 07/08/20 3:24 PM : TSH wnl. A1C 6.7 but no glucosuria present on UA. Last chem panel showed normal electrolyte levels and blood pressure is at goal so unlikely to be hyperaldosteronism. If symptoms persist at the next visit, provider can consider obtaining another BMP to r/o hyponatremia however based on history obtained from patient, sx are unlikely to be severe enough to cause significant hyponatremia.

## 2020-07-07 NOTE — Progress Notes (Addendum)
Office Visit   Patient ID: [name] [name], female    DOB: 08/12/1971, 49 y.o.   MRN: [id number]  Subjective:  CC: increased thirst, orthopnea  HPI 49 y.o. presents today for the above complaints.  Increased thirst Duration: 4-6w. No change in urine output. Denies heat or cold intolerance. No known significant weight changes. No dietary changes. Recently started on celebrex and tramadol.  Orthopnea. Pt notes 2 month history of night time cough, shortness of breath, and sense of choking at night. Cough is characterized as dry. The shortness of breath is alleviated with sitting up but is not changed by laying supine vs left or right side. She denies wheezing or chest pain.  She notes some lower extremity swelling which may be worse than previously. She also implies a recent decrease in exercise tolerance and now gets short of breath after walking up a flight of stairs or down a hallway.  She uses her albuterol inhaler sometimes at night which she says only minimally improves her symptoms. Denies nasal drainage or seasonal allergies.  Denies personal current or history of tobacco use.  History significant for orthopnea for which she uses a CPAP      ACTIVE MEDICATIONS   Current Outpatient Medications on File Prior to Visit  Medication Sig Dispense Refill  . albuterol (PROAIR HFA) 108 (90 Base) MCG/ACT inhaler Inhale 1-2 puffs into the lungs every 6 (six) hours as needed for wheezing or shortness of breath. 8 g 2  . celecoxib (CELEBREX) 100 MG capsule Take 1 capsule (100 mg total) by mouth 2 (two) times daily. 28 capsule 0  . cyclobenzaprine (FLEXERIL) 5 MG tablet Take once daily as needed for muscle spasms 30 tablet 0  . DULoxetine (CYMBALTA) 60 MG capsule Take 1 capsule (60 mg total) by mouth daily. 90 capsule 0  . EQ ASPIRIN ADULT LOW DOSE 81 MG EC tablet Take 1 tablet by mouth once daily 28 tablet 0  . fluticasone (FLONASE) 50 MCG/ACT nasal spray Place 2 sprays into both nostrils  daily. (Patient taking differently: Place 1 spray into both nostrils daily as needed. ) 16 g 0  . gabapentin (NEURONTIN) 300 MG capsule Take 1-3 capsules (300-900 mg total) by mouth See admin instructions. Take 1 capsule every morning and take 3 capsules at bedtime (Patient taking differently: Take 300-900 mg by mouth See admin instructions. Take 300 mg  capsule every morning and take 900 mg capsules at bedtime) 360 capsule 1  . lisinopril (ZESTRIL) 10 MG tablet Take 1 tablet (10 mg total) by mouth daily. 90 tablet 3  . traMADol (ULTRAM) 50 MG tablet Take 1 tablet (50 mg total) by mouth at bedtime as needed. 30 tablet 0   No current facility-administered medications on file prior to visit.    ROS  Review of Systems  Constitutional: Positive for activity change and fatigue. Negative for appetite change, chills, fever and unexpected weight change.  HENT: Negative for congestion, postnasal drip, rhinorrhea, sinus pressure, sinus pain, sneezing, sore throat, trouble swallowing and voice change.   Eyes: Negative for itching.  Respiratory: Positive for cough, choking and shortness of breath. Negative for wheezing.   Cardiovascular: Positive for leg swelling. Negative for chest pain and palpitations.  Endocrine: Positive for polydipsia. Negative for cold intolerance, heat intolerance and polyuria.  Neurological: Negative for dizziness, light-headedness and headaches.    Objective:   BP 127/79 (BP Location: Left Arm, Patient Position: Sitting, Cuff Size: Large)   Pulse 78   Temp  98 F (36.7 C) (Oral)   Ht 5\' 3"  (1.6 m)   Wt 276 lb 3.2 oz (125.3 kg)   SpO2 99% Comment: roomair  BMI 48.93 kg/m  Wt Readings from Last 3 Encounters:  07/07/20 276 lb 3.2 oz (125.3 kg)  03/09/20 263 lb (119.3 kg)  03/05/20 264 lb 8 oz (120 kg)   BP Readings from Last 3 Encounters:  07/07/20 127/79  03/09/20 113/66  03/05/20 124/86   Physical Exam Constitutional:      Appearance: She is obese. She is not  ill-appearing.  HENT:     Mouth/Throat:     Comments: No posterior pharynx cobblestoning appreciated. Eyes:     Conjunctiva/sclera: Conjunctivae normal.  Neck:     Comments: No lymphadenopathy Cardiovascular:     Rate and Rhythm: Normal rate.     Comments: Heart sounds distant likely due to body habitus. +1 non pitting lower extremity edema Pulmonary:     Effort: Pulmonary effort is normal.     Breath sounds: Normal breath sounds. No wheezing.  Skin:    General: Skin is warm and dry.     Health Maintenance:   Health Maintenance  Topic Date Due  . Hepatitis C Screening  Never done  . PAP SMEAR-Modifier  11/04/2019  . COVID-19 Vaccine (2 - Pfizer 2-dose series) 07/20/2020  . TETANUS/TDAP  09/06/2028  . INFLUENZA VACCINE  Completed  . HIV Screening  Completed     Assessment & Plan:   Problem List Items Addressed This Visit      Other   Pre-diabetes (Chronic)    A1C is 6.7 at today's visit which is up from 5.8 one year ago. Will need further discussion at her follow up appointment.      Polydipsia    This is an initial OV for this problem.  Assessment: unclear etiology at this time. Thyroid dysfunction considered--TSH wnl in 2020. Considered medication effects however no particular medications on her list are pointing to this. Considered hyperglycemia induced--no history of DM. Plan:  --TSH --A1C and UA to evaluate for new onset DM or glucosuria respectively  Addendum #1 07/08/20 3:24 PM : TSH wnl. A1C 6.7 but no glucosuria present on UA. Last chem panel showed normal electrolyte levels and blood pressure is at goal so unlikely to be hyperaldosteronism. If symptoms persist at the next visit, provider can consider obtaining another BMP to r/o hyponatremia however based on history obtained from patient, sx are unlikely to be severe enough to cause significant hyponatremia.      Orthopnea    HPI: Pt notes 2 month history of night time cough, shortness of breath, and sense  of choking at night. Cough is characterized as dry. The shortness of breath is alleviated with sitting up but is not changed by laying supine vs left or right side. She denies wheezing or chest pain.  She notes some lower extremity swelling which may be worse than previously. She also implies a recent decrease in exercise tolerance and now gets short of breath after walking up a flight of stairs or down a hallway.  She uses her albuterol inhaler sometimes at night which she says only minimally improves her symptoms. Denies nasal drainage or seasonal allergies.  Denies personal current or history of tobacco use.  History significant for orthopnea for which she uses a CPAP Assessment: primary differentials for her symptoms include new onset heart failure vs uncontrolled asthma.  -Her symptoms favor heart failure--orthopnea, choking sensation, improvement with sitting up.  Unfortunately, due to her body habitus, heart sounds were difficult to auscultate and JVD difficult due to neck circumference. She does have +1 lnon pitting lower extremity edema. She was recently started on celebrex which could worsen heart failure and kidney function.  -The lack of wheezing or chest tightness make uncontrolled asthma less likely.  -I did also consider lung scarring from COVID 19 last year however symptoms are less consistent with this. -I considered post nasal drip however history and PE findings not consistent with this -I also considered combination OSA and OHS. OSA previously diagnosed and she has been wearing her CPAP however if the diagnosis of OHS were found, this may indicate the need for BiPAP to compensate for the restrictive process. Plan --echocardiogram ordered. Although heart failure is a clinical diagnosis, her body habitus precludes an adequate physical exam. BNP ordered. --f/u after echocardiogram to review results and discuss further management. --if suspicion for heart failure is lowered and  symptoms persist, can consider obtaining PFTs to evaluate for asthma.  Addendum #1 07/08/20 3:22 PM: BNP 25 however may be falsely lowered in the setting of obesity. Will continue to await results of echocardiogram.      Relevant Orders   ECHOCARDIOGRAM COMPLETE   Brain natriuretic peptide (Completed)    Other Visit Diagnoses    Need for immunization against influenza    -  Primary   Relevant Orders   Flu Vaccine QUAD 36+ mos IM (Completed)   Excessive thirst       Relevant Orders   POC Hbg A1C (Completed)   Urinalysis, Reflex Microscopic (Completed)   TSH (Completed)   Glucose, capillary (Completed)        Pt discussed with Dr. Fredrich Romans, MD Internal Medicine Resident PGY-2 [facility] Pager: [phone] 07/08/2020 3:37 PM

## 2020-07-08 LAB — URINALYSIS, ROUTINE W REFLEX MICROSCOPIC
Bilirubin, UA: NEGATIVE
Glucose, UA: NEGATIVE
Ketones, UA: NEGATIVE
Leukocytes,UA: NEGATIVE
Nitrite, UA: NEGATIVE
Protein,UA: NEGATIVE
RBC, UA: NEGATIVE
Specific Gravity, UA: 1.019 (ref 1.005–1.030)
Urobilinogen, Ur: 0.2 mg/dL (ref 0.2–1.0)
pH, UA: 6.5 (ref 5.0–7.5)

## 2020-07-08 LAB — TSH: TSH: 1.41 u[IU]/mL (ref 0.450–4.500)

## 2020-07-08 NOTE — Assessment & Plan Note (Signed)
A1C is 6.7 at today's visit which is up from 5.8 one year ago. Will need further discussion at her follow up appointment.

## 2020-07-09 ENCOUNTER — Telehealth: Payer: Self-pay | Admitting: Internal Medicine

## 2020-07-09 NOTE — Progress Notes (Signed)
Internal Medicine Clinic Attending  Case discussed with Dr. Christian  At the time of the visit.  We reviewed the resident's history and exam and pertinent patient test results.  I agree with the assessment, diagnosis, and plan of care documented in the resident's note.  

## 2020-07-09 NOTE — Telephone Encounter (Signed)
Reviewed lab results with Gaylyn Rong via telephone. Encouraged her to arrange an appointment with the [facility] after the echocardiogram to discuss the results.  I also briefly discussed her A1C with her and that further discussion about this can be held at the follow up appt.  Elige Radon, MD Internal Medicine Resident PGY-2 [facility] Pager: [phone] 07/09/2020 10:43 AM

## 2020-07-22 ENCOUNTER — Other Ambulatory Visit: Payer: Self-pay | Admitting: Internal Medicine

## 2020-07-22 DIAGNOSIS — M5416 Radiculopathy, lumbar region: Secondary | ICD-10-CM

## 2020-07-26 DIAGNOSIS — Z419 Encounter for procedure for purposes other than remedying health state, unspecified: Secondary | ICD-10-CM | POA: Diagnosis not present

## 2020-07-29 ENCOUNTER — Encounter: Payer: Self-pay | Admitting: Internal Medicine

## 2020-07-30 NOTE — Telephone Encounter (Signed)
Attempted to call x1, left voicemail requesting call back.

## 2020-07-30 NOTE — Telephone Encounter (Signed)
Have attempted to call multiple times more. Will try again later.

## 2020-08-02 NOTE — Telephone Encounter (Signed)
Have called multiple times on different days and always goes straight to voicemail. Left another voicemail today.

## 2020-08-05 ENCOUNTER — Ambulatory Visit (HOSPITAL_COMMUNITY): Payer: Medicaid Other | Attending: Internal Medicine

## 2020-08-05 ENCOUNTER — Other Ambulatory Visit: Payer: Self-pay

## 2020-08-05 DIAGNOSIS — R0601 Orthopnea: Secondary | ICD-10-CM

## 2020-08-05 LAB — ECHOCARDIOGRAM COMPLETE
Area-P 1/2: 3.77 cm2
S' Lateral: 2.6 cm

## 2020-08-13 DIAGNOSIS — G4733 Obstructive sleep apnea (adult) (pediatric): Secondary | ICD-10-CM | POA: Diagnosis not present

## 2020-08-25 ENCOUNTER — Other Ambulatory Visit: Payer: Self-pay

## 2020-08-25 DIAGNOSIS — Z419 Encounter for procedure for purposes other than remedying health state, unspecified: Secondary | ICD-10-CM | POA: Diagnosis not present

## 2020-08-25 DIAGNOSIS — M5416 Radiculopathy, lumbar region: Secondary | ICD-10-CM

## 2020-08-26 ENCOUNTER — Encounter: Payer: Medicaid Other | Admitting: Student

## 2020-08-26 NOTE — Progress Notes (Deleted)
   CC: ***  HPI:  Ms.Chai E Ozier is a 49 y.o. woman with history as below who presents to clinic for follow-up of orthopnea discussed during last clinic visit with Dr. Ephriam Knuckles on 07/07/20.   To see the details of this patient's management of their acute and chronic problems, please refer to the Assessment & Plan under the Encounters tab.    Past Medical History:  Diagnosis Date  . Anemia   . Anxiety   . Arthritis    "left knee"  . Chronic lower back pain    "on the left side"  . Headache(784.0) 02/29/12   "frequent"  . Hypertension   . Left knee pain 11/04/2015  . Migraines   . Pneumonia 2006  . Renal insufficiency 06/18/2019  . Sleep apnea    CPAP  . Stress at home    "and at work"   Review of Systems:    ROS  Physical Exam:  There were no vitals filed for this visit. Constitutional: well-appearing *** sitting in chair, in no acute distress HENT: normocephalic atraumatic, mucous membranes moist Eyes: conjunctiva non-erythematous Neck: supple Cardiovascular: regular rate and rhythm, no m/r/g Pulmonary/Chest: normal work of breathing on room air, lungs clear to auscultation bilaterally Abdominal: soft, non-tender, non-distended MSK: normal bulk and tone Neurological: alert & oriented x 3, 5/5 strength in bilateral upper and lower extremities, normal gait Skin: warm and dry*** Psych: ***    Assessment & Plan:   See Encounters Tab for problem based charting.  Patient {GC/GE:3044014::"discussed with","seen with"} Dr. {NAMES:[id number]::"Butcher","Guilloud","Hoffman","Mullen","Narendra","Raines","Vincent"}

## 2020-08-27 ENCOUNTER — Other Ambulatory Visit: Payer: Self-pay | Admitting: *Deleted

## 2020-08-27 DIAGNOSIS — M5416 Radiculopathy, lumbar region: Secondary | ICD-10-CM

## 2020-08-27 MED ORDER — CYCLOBENZAPRINE HCL 5 MG PO TABS: ORAL_TABLET | ORAL | 0 refills | Status: DC

## 2020-09-06 ENCOUNTER — Encounter: Payer: Medicaid Other | Admitting: Student

## 2020-09-06 NOTE — Progress Notes (Deleted)
   CC: ***  HPI:  [name] [name] is a 49 y.o. woman with history as below who presents to clinic for ***. Her last clinic visit was on 07/07/20 with Dr. Ephriam Knuckles.   To see the details of this patient's management of their acute and chronic problems, please refer to the Assessment & Plan under the Encounters tab.    Past Medical History:  Diagnosis Date  . Anemia   . Anxiety   . Arthritis    "left knee"  . Chronic lower back pain    "on the left side"  . Headache(784.0) 02/29/12   "frequent"  . Hypertension   . Left knee pain 11/04/2015  . Migraines   . Pneumonia 2006  . Renal insufficiency 06/18/2019  . Sleep apnea    CPAP  . Stress at home    "and at work"   Review of Systems:    ROS  Physical Exam:  There were no vitals filed for this visit. Constitutional: well-appearing *** sitting in chair, in no acute distress HENT: normocephalic atraumatic, mucous membranes moist Eyes: conjunctiva non-erythematous Neck: supple Cardiovascular: regular rate and rhythm, no m/r/g Pulmonary/Chest: normal work of breathing on room air, lungs clear to auscultation bilaterally Abdominal: soft, non-tender, non-distended MSK: normal bulk and tone Neurological: alert & oriented x 3, 5/5 strength in bilateral upper and lower extremities, normal gait Skin: warm and dry*** Psych: ***    Assessment & Plan:   See Encounters Tab for problem based charting.  Patient {GC/GE:3044014::"discussed with","seen with"} Dr. {NAMES:[id number]::"Butcher","Guilloud","Hoffman","Mullen","Narendra","Raines","Vincent"}

## 2020-09-12 DIAGNOSIS — G4733 Obstructive sleep apnea (adult) (pediatric): Secondary | ICD-10-CM | POA: Diagnosis not present

## 2020-09-21 ENCOUNTER — Encounter: Payer: Self-pay | Admitting: Internal Medicine

## 2020-09-25 DIAGNOSIS — Z419 Encounter for procedure for purposes other than remedying health state, unspecified: Secondary | ICD-10-CM | POA: Diagnosis not present

## 2020-09-27 ENCOUNTER — Encounter: Payer: Medicaid Other | Admitting: Internal Medicine

## 2020-10-01 ENCOUNTER — Other Ambulatory Visit: Payer: Self-pay | Admitting: Surgical

## 2020-10-01 ENCOUNTER — Other Ambulatory Visit: Payer: Self-pay | Admitting: Internal Medicine

## 2020-10-01 DIAGNOSIS — M5416 Radiculopathy, lumbar region: Secondary | ICD-10-CM

## 2020-10-01 NOTE — Telephone Encounter (Signed)
Dr. Chesley Mires filled this prescription today

## 2020-10-01 NOTE — Telephone Encounter (Signed)
Pls advise.  

## 2020-10-04 ENCOUNTER — Other Ambulatory Visit: Payer: Self-pay

## 2020-10-04 ENCOUNTER — Encounter: Payer: Self-pay | Admitting: Internal Medicine

## 2020-10-04 ENCOUNTER — Ambulatory Visit (INDEPENDENT_AMBULATORY_CARE_PROVIDER_SITE_OTHER): Payer: Medicaid Other | Admitting: Internal Medicine

## 2020-10-04 VITALS — BP 137/92 | HR 80 | Temp 98.4°F | Wt 277.6 lb

## 2020-10-04 DIAGNOSIS — G8929 Other chronic pain: Secondary | ICD-10-CM | POA: Diagnosis not present

## 2020-10-04 DIAGNOSIS — M25562 Pain in left knee: Secondary | ICD-10-CM

## 2020-10-04 NOTE — Patient Instructions (Signed)
[name] [name], It was nice meeting you! Today we discussed your knee pain - try a brace or compression sleeve to minimize swelling and give support at work - I am referring you to physical therapy to help strengthen the muscles and work on exercises to help with pain  - keep taking your medications as prescribed  - I will fill out the form for your handicap sticker

## 2020-10-05 ENCOUNTER — Encounter: Payer: Self-pay | Admitting: Internal Medicine

## 2020-10-05 DIAGNOSIS — G8929 Other chronic pain: Secondary | ICD-10-CM | POA: Insufficient documentation

## 2020-10-05 DIAGNOSIS — M25562 Pain in left knee: Secondary | ICD-10-CM | POA: Insufficient documentation

## 2020-10-05 NOTE — Assessment & Plan Note (Signed)
Patient presents with persistent left knee pain following meniscal repair in June of 2021. She states after having her right knee scoped, she recovered well and has not had any further pain. Her left knee, however, has continued to give her issues. She has recently started a new job that requires her to be on her feet for 10 hours and go up and down stairs, and her knee has a significant impact on her ability to do this. She endorses swelling that improves with elevation. She has been taking Celecoxib twice daily to help with pain. She mentions she never had PT following surgery.  At this point, I have recommended formal PT to strengthen muscles around the knee and work on exercises to improve pain and ROM.  I have also recommended that she wear a knee brace to help with compression and support while she works. Continue elevating the knee when able.

## 2020-10-05 NOTE — Progress Notes (Signed)
Acute Office Visit  Subjective:    Patient ID: [name] [name], female    DOB: 1971/05/31, 50 y.o.   MRN: [id number]  Chief Complaint  Patient presents with  . Knee Pain    HX, LEFT KNEE ARTHROSCOPY, MEDIAL MENISCAL ROOT REPAIR on 03/09/2020    HPI Patient is in today for chronic left knee pain. Please see problem based charting for further details on today's visit.   Past Medical History:  Diagnosis Date  . Anemia   . Anxiety   . Arthritis    "left knee"  . Chronic lower back pain    "on the left side"  . Headache(784.0) 02/29/12   "frequent"  . Hypertension   . Left knee pain 11/04/2015  . Migraines   . Pneumonia 2006  . Renal insufficiency 06/18/2019  . Sleep apnea    CPAP  . Stress at home    "and at work"    Past Surgical History:  Procedure Laterality Date  . BREAST BIOPSY Left 07/29/2013  . CESAREAN SECTION  08/2008  . CHOLECYSTECTOMY  03/02/2012   Procedure: LAPAROSCOPIC CHOLECYSTECTOMY;  Surgeon: Cherylynn Ridges, MD;  Location: [facility] OR;  Service: General;;  . KNEE ARTHROSCOPY WITH MENISCAL REPAIR Right 06/25/2018   Procedure: RIGHT KNEE ARTHROSCOPY WITH MENISCAL ROOT REPAIR;  Surgeon: Cammy Copa, MD;  Location: [facility] OR;  Service: Orthopedics;  Laterality: Right;  . KNEE ARTHROSCOPY WITH MENISCAL REPAIR Left 03/09/2020   Procedure: LEFT KNEE ARTHROSCOPY, MEDIAL MENISCAL ROOT REPAIR;  Surgeon: Cammy Copa, MD;  Location: [facility] OR;  Service: Orthopedics;  Laterality: Left;  . WISDOM TOOTH EXTRACTION      Family History  Problem Relation Age of Onset  . Cancer Other     Social History   Socioeconomic History  . Marital status: Single    Spouse name: Not on file  . Number of children: Not on file  . Years of education: Not on file  . Highest education level: Not on file  Occupational History  . Not on file  Tobacco Use  . Smoking status: Never Smoker  . Smokeless tobacco: Never Used  Vaping Use  . Vaping Use: Never used  Substance and Sexual  Activity  . Alcohol use: No    Alcohol/week: 0.0 standard drinks  . Drug use: No  . Sexual activity: Not on file  Other Topics Concern  . Not on file  Social History Narrative  . Not on file   Social Determinants of Health   Financial Resource Strain: Not on file  Food Insecurity: Not on file  Transportation Needs: Not on file  Physical Activity: Not on file  Stress: Not on file  Social Connections: Not on file  Intimate Partner Violence: Not on file    Outpatient Medications Prior to Visit  Medication Sig Dispense Refill  . albuterol (PROAIR HFA) 108 (90 Base) MCG/ACT inhaler Inhale 1-2 puffs into the lungs every 6 (six) hours as needed for wheezing or shortness of breath. 8 g 2  . celecoxib (CELEBREX) 100 MG capsule Take 1 capsule by mouth twice daily 28 capsule 0  . cyclobenzaprine (FLEXERIL) 5 MG tablet TAKE 1 TABLET BY MOUTH ONCE DAILY FOR MUSCLE SPASM 30 tablet 0  . DULoxetine (CYMBALTA) 60 MG capsule Take 1 capsule (60 mg total) by mouth daily. 90 capsule 0  . EQ ASPIRIN ADULT LOW DOSE 81 MG EC tablet Take 1 tablet by mouth once daily 28 tablet 0  . fluticasone (FLONASE) 50  MCG/ACT nasal spray Place 2 sprays into both nostrils daily. (Patient taking differently: Place 1 spray into both nostrils daily as needed. ) 16 g 0  . gabapentin (NEURONTIN) 300 MG capsule Take 1-3 capsules (300-900 mg total) by mouth See admin instructions. Take 1 capsule every morning and take 3 capsules at bedtime (Patient taking differently: Take 300-900 mg by mouth See admin instructions. Take 300 mg  capsule every morning and take 900 mg capsules at bedtime) 360 capsule 1  . lisinopril (ZESTRIL) 10 MG tablet Take 1 tablet (10 mg total) by mouth daily. 90 tablet 3  . traMADol (ULTRAM) 50 MG tablet Take 1 tablet (50 mg total) by mouth at bedtime as needed. 30 tablet 0   No facility-administered medications prior to visit.    Allergies  Allergen Reactions  . Mushroom Extract Complex Hives and  Itching    Mushroom as a whole the pt is allergic to.    Review of Systems  Constitutional: Negative for activity change, chills, fever and unexpected weight change.  Musculoskeletal: Positive for joint swelling.  Skin: Negative for rash.  Neurological: Negative for weakness and numbness.       Objective:    Physical Exam Constitutional:      General: She is not in acute distress.    Appearance: Normal appearance. She is obese.  Cardiovascular:     Pulses: Normal pulses.  Musculoskeletal:     Left knee: Crepitus present. Tenderness present over the medial joint line.     Instability Tests: Anterior drawer test negative. Posterior drawer test negative.  Neurological:     Mental Status: She is alert.     Sensory: No sensory deficit.     Motor: No weakness.     Gait: Gait normal.     BP (!) 137/92 (BP Location: Left Arm, Patient Position: Sitting, Cuff Size: Large)   Pulse 80   Temp 98.4 F (36.9 C) (Oral)   Wt 277 lb 9.6 oz (125.9 kg)   SpO2 98%   BMI 49.17 kg/m  Wt Readings from Last 3 Encounters:  10/04/20 277 lb 9.6 oz (125.9 kg)  07/07/20 276 lb 3.2 oz (125.3 kg)  03/09/20 263 lb (119.3 kg)    Health Maintenance Due  Topic Date Due  . Hepatitis C Screening  Never done  . COLONOSCOPY (Pts 45-53yrs Insurance coverage will need to be confirmed)  Never done  . PAP SMEAR-Modifier  11/04/2019  . COVID-19 Vaccine (2 - Pfizer 2-dose series) 07/20/2020    There are no preventive care reminders to display for this patient.   Lab Results  Component Value Date   TSH 1.410 07/07/2020   Lab Results  Component Value Date   WBC 6.9 03/05/2020   HGB 12.6 03/05/2020   HCT 41.2 03/05/2020   MCV 84.3 03/05/2020   PLT 425 (H) 03/05/2020   Lab Results  Component Value Date   NA 141 03/05/2020   K 4.3 03/05/2020   CO2 24 03/05/2020   GLUCOSE 126 (H) 03/05/2020   BUN 7 03/05/2020   CREATININE 0.82 03/05/2020   BILITOT 0.5 06/23/2019   ALKPHOS 68 06/23/2019    AST 26 06/23/2019   ALT 46 (H) 06/23/2019   PROT 6.9 06/23/2019   ALBUMIN 3.0 (L) 06/23/2019   CALCIUM 9.0 03/05/2020   ANIONGAP 7 03/05/2020   Lab Results  Component Value Date   CHOL 162 10/30/2018   Lab Results  Component Value Date   HDL 55 10/30/2018   Lab  Results  Component Value Date   LDLCALC 94 10/30/2018   Lab Results  Component Value Date   TRIG 62 06/18/2019   Lab Results  Component Value Date   CHOLHDL 2.9 10/30/2018   Lab Results  Component Value Date   HGBA1C 6.7 (A) 07/07/2020       Assessment & Plan:   Problem List Items Addressed This Visit      Other   Chronic pain of left knee - Primary    Patient presents with persistent left knee pain following meniscal repair in June of 2021. She states after having her right knee scoped, she recovered well and has not had any further pain. Her left knee, however, has continued to give her issues. She has recently started a new job that requires her to be on her feet for 10 hours and go up and down stairs, and her knee has a significant impact on her ability to do this. She endorses swelling that improves with elevation. She has been taking Celecoxib twice daily to help with pain. She mentions she never had PT following surgery.  At this point, I have recommended formal PT to strengthen muscles around the knee and work on exercises to improve pain and ROM.  I have also recommended that she wear a knee brace to help with compression and support while she works. Continue elevating the knee when able.       Relevant Orders   Ambulatory referral to Physical Therapy       No orders of the defined types were placed in this encounter.    Bridget Hartshorn, DO

## 2020-10-07 NOTE — Progress Notes (Signed)
Internal Medicine Clinic Attending  Case discussed with Dr. Bloomfield at the time of the visit.  We reviewed the resident's history and exam and pertinent patient test results.  I agree with the assessment, diagnosis, and plan of care documented in the resident's note.  Birdell Frasier, M.D., Ph.D.  

## 2020-10-13 ENCOUNTER — Encounter: Payer: Self-pay | Admitting: *Deleted

## 2020-10-13 DIAGNOSIS — G4733 Obstructive sleep apnea (adult) (pediatric): Secondary | ICD-10-CM | POA: Diagnosis not present

## 2020-10-13 DIAGNOSIS — Z20822 Contact with and (suspected) exposure to covid-19: Secondary | ICD-10-CM | POA: Diagnosis not present

## 2020-10-26 DIAGNOSIS — Z419 Encounter for procedure for purposes other than remedying health state, unspecified: Secondary | ICD-10-CM | POA: Diagnosis not present

## 2020-11-08 ENCOUNTER — Other Ambulatory Visit: Payer: Self-pay

## 2020-11-08 ENCOUNTER — Other Ambulatory Visit: Payer: Self-pay | Admitting: Internal Medicine

## 2020-11-08 ENCOUNTER — Ambulatory Visit: Payer: Medicaid Other | Attending: Internal Medicine | Admitting: Physical Therapy

## 2020-11-08 ENCOUNTER — Encounter: Payer: Self-pay | Admitting: Physical Therapy

## 2020-11-08 ENCOUNTER — Other Ambulatory Visit: Payer: Self-pay | Admitting: Surgical

## 2020-11-08 DIAGNOSIS — M5416 Radiculopathy, lumbar region: Secondary | ICD-10-CM

## 2020-11-08 DIAGNOSIS — M25562 Pain in left knee: Secondary | ICD-10-CM | POA: Diagnosis not present

## 2020-11-08 DIAGNOSIS — M6281 Muscle weakness (generalized): Secondary | ICD-10-CM | POA: Diagnosis not present

## 2020-11-08 DIAGNOSIS — G8929 Other chronic pain: Secondary | ICD-10-CM | POA: Insufficient documentation

## 2020-11-08 NOTE — Therapy (Addendum)
[facility] [address] [place], [place], [address] Phone: [phone]   Fax:  [phone]  Physical Therapy Evaluation  Patient Details  Name: [name] [name] MRN: [id number] Date of Birth: 1971/05/21 Referring Provider (PT): Anne Shutter, MD    Encounter Date: 11/08/2020   PT End of Session - 11/08/20 1049    Visit Number 1    Number of Visits 6    Date for PT Re-Evaluation 12/20/20    Authorization Type [place] MEDICAID [facility]    PT Start Time 0925    PT Stop Time 1016    PT Time Calculation (min) 51 min    Activity Tolerance Patient tolerated treatment well    Behavior During Therapy [facility] for tasks assessed/performed           Past Medical History:  Diagnosis Date  . Anemia   . Anxiety   . Arthritis    "left knee"  . Chronic lower back pain    "on the left side"  . Headache(784.0) 02/29/12   "frequent"  . Hypertension   . Left knee pain 11/04/2015  . Migraines   . Pneumonia 2006  . Renal insufficiency 06/18/2019  . Sleep apnea    CPAP  . Stress at home    "and at work"    Past Surgical History:  Procedure Laterality Date  . BREAST BIOPSY Left 07/29/2013  . CESAREAN SECTION  08/2008  . CHOLECYSTECTOMY  03/02/2012   Procedure: LAPAROSCOPIC CHOLECYSTECTOMY;  Surgeon: Cherylynn Ridges, MD;  Location: [facility] OR;  Service: General;;  . KNEE ARTHROSCOPY WITH MENISCAL REPAIR Right 06/25/2018   Procedure: RIGHT KNEE ARTHROSCOPY WITH MENISCAL ROOT REPAIR;  Surgeon: Cammy Copa, MD;  Location: [facility] OR;  Service: Orthopedics;  Laterality: Right;  . KNEE ARTHROSCOPY WITH MENISCAL REPAIR Left 03/09/2020   Procedure: LEFT KNEE ARTHROSCOPY, MEDIAL MENISCAL ROOT REPAIR;  Surgeon: Cammy Copa, MD;  Location: [facility] OR;  Service: Orthopedics;  Laterality: Left;  . WISDOM TOOTH EXTRACTION      There were no vitals filed for this visit.    Subjective Assessment - 11/08/20 0927    Subjective I had a L and R knee  meniscal repair at the end of last year. The R knee is fine but the L knee is still giving me a lot of pain. I have a lot of problems with swelling. It feels like my knee 'goes out to the side' if I walk on it or try to straighten it out. There is a lot of grinding and popping, sometimes it feels like it gives out on me. The doctor tells me I am bone on bone.    Pertinent History L meniscal debridement June 2021, R meniscal debridement ~August 2021, anemia, anxiety, L chronic LBP, HTN    Limitations Sitting;Lifting;Standing;Walking;House hold activities    How long can you sit comfortably? 'normal amount of time'    How long can you stand comfortably? 30 minutes    How long can you walk comfortably? 30 minutes    Patient Stated Goals decrease pain    Currently in Pain? Yes    Pain Score 4    10/10 usually   Pain Location Knee    Pain Orientation Left    Pain Descriptors / Indicators Throbbing;Aching;Sharp;Grimacing   'can kind of be all over the place'   Pain Type Chronic pain    Pain Onset More than a month ago    Pain Frequency Constant  Aggravating Factors  bending, full straightneing it out, standing, walking, stairs (worse going up compared to going down)    Pain Relieving Factors not putting weight through it, lifting my heel up in the air/not letting the L foot touch the floor    Effect of Pain on Daily Activities try not to leave the house, stopped working because I can't stand for extended periods of time              [facility] PT Assessment - 11/08/20 0001      Assessment   Medical Diagnosis Chronic L Knee pain s/p meniscal repair    Referring Provider (PT) Anne Shutter, MD    Onset Date/Surgical Date --   June 2021   Next MD Visit --   not scheduled, discussed that she should follow up with surgeon   Prior Therapy LBP 2015, none after meniscal repairs      Precautions   Precautions None      Restrictions   Weight Bearing Restrictions No      Balance Screen    Has the patient fallen in the past 6 months No    Has the patient had a decrease in activity level because of a fear of falling?  Yes    Is the patient reluctant to leave their home because of a fear of falling?  Yes      Home Environment   Living Environment Private residence    Living Arrangements Children   34 year old son   Home Access Stairs to enter    Entrance Stairs-Number of Steps flight    Entrance Stairs-Rails Can reach both    Home Layout One level    Home Equipment [place] - single point      Prior Function   Level of Independence Independent with community mobility with device    Vocation Other (comment)   not working secondary to not being able to stand for extended periods of time     Cognition   Overall Cognitive Status Within Functional Limits for tasks assessed      Observation/Other Assessments-Edema    Edema --   unable to see secondary to wearing jeans, but L>R edema generally     Sensation   Additional Comments --   L knee painful with light touch     ROM / Strength   AROM / PROM / Strength AROM;PROM;Strength      AROM   Overall AROM  Deficits;Due to pain    AROM Assessment Site Knee    Right/Left Knee Left;Right    Right Knee Extension 2   lacking   Right Knee Flexion 107    Left Knee Extension 3   lacking   Left Knee Flexion 65      PROM   Overall PROM  Deficits;Due to pain    PROM Assessment Site Knee    Right/Left Knee Right;Left    Right Knee Extension 0    Right Knee Flexion 112   no pain   Left Knee Extension --   not assessed secondary to pain with L knee extension AROM   Left Knee Flexion 68, painful   84 post heel slides     Strength   Overall Strength Within functional limits for tasks performed;Due to pain    Strength Assessment Site Hip;Knee    Right/Left Hip Right;Left    Right Hip Flexion 5/5    Right Hip Extension 3+/5   painful in back   Right  Hip ABduction 4+/5    Left Hip Flexion 4+/5    Left Hip Extension 3+/5   painful in  back and knee   Left Hip ABduction 4-/5   painful in knee   Right/Left Knee Right;Left    Right Knee Flexion 5/5    Right Knee Extension 5/5    Left Knee Flexion 3/5   extremely painful in front of knee   Left Knee Extension 3+/5   painful, front of knee     Palpation   Patella mobility --   unable to asses secondary to pain with just light touch                     Objective measurements completed on examination: See above findings.       [facility] Adult [facility] Treatment/Exercise - 11/08/20 0001      Exercises   Exercises Knee/Hip      Knee/Hip Exercises: Seated   Long Arc Quad Strengthening;10 reps;Left      Knee/Hip Exercises: Supine   Heel Slides 20 reps;Left    Heel Slides Limitations --   limited range     Knee/Hip Exercises: Sidelying   Hip ABduction Other (comment);10 reps;Right;Strengthening   unable to perform on L secondary to pain with L knee extension and migration of hip anteriorly   Clams L x10 clamshells, no band, with cueing to keep torso still   with pillow between knees                 PT Education - 11/08/20 1048    Education Details POC, sx explanation, education on OA being a normal part of aging, HEP    Person(s) Educated Patient    Methods Explanation;Demonstration;Tactile cues;Verbal cues;Handout    Comprehension Verbalized understanding;Need further instruction            PT Short Term Goals - 11/08/20 1135      PT SHORT TERM GOAL #1   Title Pt will be independent with initial HEP.    Baseline HEP provided at evaluation    Time 3    Period Weeks    Status New    Target Date 11/29/20      PT SHORT TERM GOAL #2   Title Pt will report </= 5/10 pain with activity to reduce functional limitations    Baseline 10/10    Time 3    Period Weeks    Status New    Target Date 11/29/20             PT Long Term Goals - 11/08/20 1136      PT LONG TERM GOAL #1   Title Pt will achieve 112 L knee AROM without an increase in  pain in order to help with ambulation and stair navigation.    Baseline 68 deg    Time 6    Period Weeks    Status New    Target Date 12/20/20      PT LONG TERM GOAL #2   Title Pt will have 5/5 L hip MMTs in order to help increase functional activity.    Baseline Patient exhibits bilateral strength deficits of hips    Time 6    Period Weeks    Status New    Target Date 12/20/20      PT LONG TERM GOAL #3   Title Pt will have L knee flex/ext 4/5 MMT in order to help decrease pain and increase functional ability.  Baseline Patient exhibits left knee strength deficits    Time 6    Period Weeks    Status New    Target Date 12/20/20      PT LONG TERM GOAL #4   Title Patient will be I with final HEP to maintain progress from PT    Baseline Provided at evaluation    Time 6    Period Weeks    Status New    Target Date 12/20/20                  Plan - 11/08/20 1050    Clinical Impression Statement Pt is a 50 y/o F presenting to PT with L knee pain s/p L meniscal debridement June 2021. Examination revealed L knee edema, limited L knee ROM, strength deficits in bilateral hips and L knee MMTs, pain with L knee light touch, and increased pain with CKC activities. Pt also displays possible fear avoidance with movement, as she tends to avoid activities and movements that might increase her pain. All of this is limiting her from performing daily necessary movement on the L LE, ambulation, sleeping, and weightbearing in the extremity, thus limiting her from getting into/out of her home, navigating the community, and working. Pt would benefit from skilled PT in order to help decrease pain, increase LE strength and endurance, improve ROM and ambulation, and education that L LE is safe in order to help address the above participation restrictions.    Personal Factors and Comorbidities Comorbidity 3+;Past/Current Experience;Other;Fitness   possible fear avoidance   Comorbidities bilateral  meniscal debridement, anemia, anxiety, L chronic LBP, HTN    Examination-Activity Limitations Bed Mobility;Sit;Sleep;Squat;Stairs;Stand;Locomotion Level;Bend;Lift    Examination-Participation Restrictions Cleaning;Community Activity;Occupation;Shop;Yard Work    Conservation officer, historic buildings Stable/Uncomplicated    Clinical Decision Making Moderate    Rehab Potential Fair    PT Frequency 1x / week    PT Duration 6 weeks    PT Treatment/Interventions ADLs/Self Care Home Management;Aquatic Therapy;Electrical Stimulation;Moist Heat;Stair training;Gait training;Functional mobility training;Therapeutic activities;Therapeutic exercise;Balance training;Patient/family education;Manual techniques;Passive range of motion    PT Next Visit Plan fear avoidance questionnaire/LEFS PRN, PNE education PRN, progress ROM, manual PRN, progress hip and knee strengthening, assess gait    PT Home Exercise Plan [email or web address]    Consulted and Agree with Plan of Care Patient           Patient will benefit from skilled therapeutic intervention in order to improve the following deficits and impairments:  Abnormal gait,Difficulty walking,Decreased range of motion,Pain,Decreased activity tolerance,Decreased balance,Decreased strength,Decreased mobility  Visit Diagnosis: Chronic pain of left knee - Plan: PT plan of care cert/re-cert  Muscle weakness (generalized) - Plan: PT plan of care cert/re-cert     Problem List Patient Active Problem List   Diagnosis Date Noted  . Chronic pain of left knee 10/05/2020  . Polydipsia 07/07/2020  . Orthopnea 07/07/2020  . Left knee injury, subsequent encounter 01/14/2020  . Carpal tunnel syndrome 10/23/2019  . Trigger finger, right 08/14/2019  . 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 Pneumonia due to COVID-19 virus 06/18/2019  . Acute respiratory failure with hypoxia (HCC) 06/18/2019  . Renal insufficiency 06/18/2019  . Hyperkalemia 10/30/2018  . Posterior knee pain, right  02/21/2018  . Essential hypertension 01/30/2018  . Mass on back 01/30/2018  . Cervical radiculopathy 04/30/2017  . Left wrist pain 11/28/2016  . Pre-diabetes 02/16/2016  . OSA (obstructive sleep apnea) 05/14/2015  . Chronic lumbar radiculopathy 11/03/2014  . Other allergic rhinitis 09/07/2014  .  Health care maintenance 09/07/2014  . OBESITY, MORBID 09/20/2006  . Anemia, iron deficiency 08/09/2006    Jeri Cos, SPT 11/08/2020, 12:03 PM  [facility] [address] [place], [place], [address] Phone: [phone]   Fax:  [phone]  Name: [name] [name] MRN: [id number] Date of Birth: 12-27-1970   [facility] Authorization   Choose one: Rehabilitative  Standardized Assessment or Functional Outcome Tool: See Pain Assessment  Score or Percent Disability: 50%  Body Parts Treated (Select each separately):  1. Knee. Overall deficits/functional limitations for body part selected: moderate  Check all possible CPT codes: 36468- Therapeutic Exercise, [phone]- Neuro Re-education, [phone] - Gait Training, [phone] - Manual Therapy, 97530 - Therapeutic Activities, and 97535 - Self Care

## 2020-11-08 NOTE — Patient Instructions (Signed)
Access Code: [email or web address] URL: [email or web address] Date: 11/08/2020 Prepared by: Rosana Hoes  Exercises  Hooklying Heel Slide - 1 x daily - 7 x weekly - 3 sets - 10 reps Clamshell - 1 x daily - 7 x weekly - 3 sets - 10 reps Seated Long Arc Quad - 1 x daily - 7 x weekly - 3 sets - 10 reps

## 2020-11-08 NOTE — Telephone Encounter (Signed)
Pls advise.  

## 2020-11-09 NOTE — Telephone Encounter (Signed)
Sending refillx1 but recommending to follow up with PCP before next refill.

## 2020-11-10 NOTE — Telephone Encounter (Signed)
Patient was here today in clinic with a relative.  Spoke with her and made her aware that she needs an appointment.  Appointment scheduled for 12/09/2020 @ 1:15 pm with Dr. Claudette Laws.  Appt letter printed and given to patient with date and time.

## 2020-11-10 NOTE — Telephone Encounter (Signed)
Called Pt. LMOM.  Will try again.

## 2020-11-10 NOTE — Telephone Encounter (Signed)
Agree with need for appointment, thank you!

## 2020-11-11 ENCOUNTER — Other Ambulatory Visit: Payer: Self-pay | Admitting: Internal Medicine

## 2020-11-11 NOTE — Telephone Encounter (Signed)
I was unable to find clear indication of why she is on ASA, and if so why she is prescribed 4 weeks at a time, and last was 4 months ago. Her 10-years ASCVD risk is 7.6% so perhaps for that. Tried to call patient for more information, but went straight to voicemail. Left message, and will call again later.

## 2020-11-15 NOTE — Telephone Encounter (Signed)
Also attempted to reach patient. Call is going straight to voicemail. Medication stopped at discharge on discharge a few years ago. No clear reason to prescribe, will deny at this time.

## 2020-11-23 DIAGNOSIS — Z419 Encounter for procedure for purposes other than remedying health state, unspecified: Secondary | ICD-10-CM | POA: Diagnosis not present

## 2020-11-29 ENCOUNTER — Other Ambulatory Visit: Payer: Self-pay

## 2020-11-29 ENCOUNTER — Encounter: Payer: Self-pay | Admitting: Physical Therapy

## 2020-11-29 ENCOUNTER — Ambulatory Visit: Payer: Medicaid Other | Attending: Internal Medicine | Admitting: Physical Therapy

## 2020-11-29 DIAGNOSIS — M25562 Pain in left knee: Secondary | ICD-10-CM | POA: Diagnosis not present

## 2020-11-29 DIAGNOSIS — G8929 Other chronic pain: Secondary | ICD-10-CM | POA: Diagnosis not present

## 2020-11-29 DIAGNOSIS — M6281 Muscle weakness (generalized): Secondary | ICD-10-CM | POA: Diagnosis not present

## 2020-11-29 NOTE — Patient Instructions (Signed)
Access Code: [email or web address] URL: [email or web address] Date: 11/29/2020 Prepared by: Rosana Hoes  Exercises  Hooklying Heel Slide - 1 x daily - 7 x weekly - 2-3 sets - 10 reps Hook Lying Single Knee to Chest Stretch with Towel - 1 x daily - 7 x weekly - 2-3 reps - 30 seconds hold Hooklying Clamshell with Resistance - 1 x daily - 7 x weekly - 2-3 sets - 10 reps Seated Long Arc Quad - 1 x daily - 7 x weekly - 2-3 sets - 10 reps Seated Hamstring Stretch - 1 x daily - 7 x weekly - 2-3 reps - 30 seconds hold Standing Hip Abduction with Counter Support - 1 x daily - 7 x weekly - 2-3 sets - 10 reps Standing Hip Extension with Counter Support - 1 x daily - 7 x weekly - 2-3 sets - 10 reps

## 2020-11-29 NOTE — Therapy (Signed)
[facility] [address] [place], [place], [address] Phone: [phone]   Fax:  [phone]  Physical Therapy Treatment  Patient Details  Name: [name] [name] MRN: [id number] Date of Birth: Jun 07, 1971 Referring Provider (PT): Anne Shutteraines, Alexander N, MD   Encounter Date: 11/29/2020   PT End of Session - 11/29/20 0900    Visit Number 2    Number of Visits 6    Date for PT Re-Evaluation 12/20/20    Authorization Type [place] MEDICAID [employer]    PT Start Time 0915    PT Stop Time 1000    PT Time Calculation (min) 45 min    Activity Tolerance Patient tolerated treatment well;Patient limited by pain    Behavior [facility] for tasks assessed/performed           Past Medical History:  Diagnosis Date  . Anemia   . Anxiety   . Arthritis    "left knee"  . Chronic lower back pain    "on the left side"  . Headache(784.0) 02/29/12   "frequent"  . Hypertension   . Left knee pain 11/04/2015  . Migraines   . Pneumonia 2006  . Renal insufficiency 06/18/2019  . Sleep apnea    CPAP  . Stress at home    "and at work"    Past Surgical History:  Procedure Laterality Date  . BREAST BIOPSY Left 07/29/2013  . CESAREAN SECTION  08/2008  . CHOLECYSTECTOMY  03/02/2012   Procedure: LAPAROSCOPIC CHOLECYSTECTOMY;  Surgeon: Cherylynn RidgesJames O Wyatt, MD;  [facility];  Service: General;;  . KNEE ARTHROSCOPY WITH MENISCAL REPAIR Right 06/25/2018   Procedure: RIGHT KNEE ARTHROSCOPY WITH MENISCAL ROOT REPAIR;  Surgeon: Cammy Copaean, Gregory Scott, MD;  [facility];  Service: Orthopedics;  Laterality: Right;  . KNEE ARTHROSCOPY WITH MENISCAL REPAIR Left 03/09/2020   Procedure: LEFT KNEE ARTHROSCOPY, MEDIAL MENISCAL ROOT REPAIR;  Surgeon: Cammy Copaean, Gregory Scott, MD;  [facility];  Service: Orthopedics;  Laterality: Left;  . WISDOM TOOTH EXTRACTION      There were no vitals filed for this visit.   Subjective Assessment - 11/29/20 0917    Subjective  Exercises have been going well, but my knee feels stiff and sore after. I've been walking outside almost everyday, just around the block. I am using my cane when I walk. The pain i've been having the last couple days I think is due to the OA.    Pain Score 4     Pain Location Knee    Pain Orientation Left    Pain Descriptors / Indicators Aching    Pain Type Chronic pain              [facility] PT Assessment - 11/29/20 0001      Observation/Other Assessments   Other Surveys  Lower Extremity Functional Scale    Lower Extremity Functional Scale  34/80 (42.5%)                         [facility] Adult PT Treatment/Exercise - 11/29/20 0001      Self-Care   Self-Care Other Self-Care Comments    Other Self-Care Comments  LEFS and education on results; education on what 'crunching' noises in her knee could be (swelling, decreased activities, etc.) and that it doesnt necessarily mean damage/injury; education on allowing her pain to self limit activities and that no activity is inheritenly damaging; education on how decreased activity can affect the body/pain  Exercises   Exercises Knee/Hip      Knee/Hip Exercises: Stretches   Passive Hamstring Stretch --   EOB 2x30   Passive Hamstring Stretch Limitations slight L knee bend needed to avoid pain; active dorsiflexion to add calf stretch      Knee/Hip Exercises: Aerobic   Nustep L3 x5 min UE and LE, slow movement noted      Knee/Hip Exercises: Standing   Hip Abduction 2 sets;Knee bent   8 reps   Abduction Limitations only on L LE with slight knee bend, SLS on L LE caused paincues to keep torso straight    Hip Extension 2 sets;Knee bent   8 reps   Extension Limitations only on L LE with slight knee bend, SLS on L LE caused paincues to keep torso straight      Knee/Hip Exercises: Seated   Long Arc Quad 20 reps;2 sets    [employer] Weight 1 lbs.    Long [employer] Limitations full extension caused 'crunching', instructed not to  go to full extension if causing pain      Knee/Hip Exercises: Supine   Heel Slides 20 reps;Left    Heel Slides Limitations limited range    Bridges 5 reps    Bridges Limitations painful in hip, back, knee; all weight on the R LE; d/c    Other Supine Knee/Hip Exercises clamshells green 2x10                  PT Education - 11/29/20 0859    Education Details HEP update, education on graded exercise/PNE    Person(s) Educated Patient    Methods Explanation;Demonstration;Tactile cues;Verbal cues;Handout    Comprehension Verbalized understanding;Need further instruction            PT Short Term Goals - 11/29/20 0900      PT SHORT TERM GOAL #1   Title Pt will be independent with initial HEP.    Baseline still requires cueing to avoid painful motion    Time --    Period --    Status On-going    Target Date 11/29/20      PT SHORT TERM GOAL #2   Title Pt will report </= 5/10 pain with activity to reduce functional limitations    Baseline not assessed this session, but still above 5/10    Time --    Period --    Status On-going    Target Date --             PT Long Term Goals - 11/08/20 1136      PT LONG TERM GOAL #1   Title Pt will achieve 112 L knee AROM without an increase in pain in order to help with ambulation and stair navigation.    Baseline 68 deg    Time 6    Period Weeks    Status New    Target Date 12/20/20      PT LONG TERM GOAL #2   Title Pt will have 5/5 L hip MMTs in order to help increase functional activity.    Baseline Patient exhibits bilateral strength deficits of hips    Time 6    Period Weeks    Status New    Target Date 12/20/20      PT LONG TERM GOAL #3   Title Pt will have L knee flex/ext 4/5 MMT in order to help decrease pain and increase functional ability.    Baseline Patient exhibits left knee  strength deficits    Time 6    Period Weeks    Status New    Target Date 12/20/20      PT LONG TERM GOAL #4   Title Patient will  be I with final HEP to maintain progress from PT    Baseline Provided at evaluation    Time 6    Period Weeks    Status New    Target Date 12/20/20                 Plan - 11/29/20 1012    Clinical Impression Statement Pt tolerated PT well with no adverse effects. Pt showed antalgic gait at beginning of session that was slighly better at end of session. LEFS outcome measure this session showed pt at 42.5% functioning with significant limitations in running, hopping, heavy activities, holding positions for extended periods of time, squatting, and putting on shoes.On Nustep pt showed slow hesistant movement. Stretching in the posterior L LE was implemented this session with good tolerance while the knee was in slight flexion. Supine and sitting LE exercises were progressed this session with focusing on the hip extensors as well as knee extensors. Standing exercises were introduced this session but pt unable to tolerate exercises that required SLS on the L LE despite modifications. Significant education was given this session that normal age related changes don't mean damage, as well as how immobility/decreased activity, tightness, and swelling can have on the knee. Pt continues to benefit from skilled PT in order to educate on pain neuroscience, increase activity, ROM, and LE strength in order to increase walking ability and community ambulation with LRAD, decrease pain and fear with movement, and increase ability to perform basic IADLs around the home.    PT Treatment/Interventions ADLs/Self Care Home Management;Aquatic Therapy;Electrical Stimulation;Moist Heat;Stair training;Gait training;Functional mobility training;Therapeutic activities;Therapeutic exercise;Balance training;Patient/family education;Manual techniques;Passive range of motion    PT Next Visit Plan PNE cards, progress ROM, manual PRN, progress supine and standing hip and knee strengthening PRN    PT Home Exercise Plan [id number]     Consulted and Agree with Plan of Care Patient           Patient will benefit from skilled therapeutic intervention in order to improve the following deficits and impairments:  Abnormal gait,Difficulty walking,Decreased range of motion,Pain,Decreased activity tolerance,Decreased balance,Decreased strength,Decreased mobility  Visit Diagnosis: Chronic pain of left knee  Muscle weakness (generalized)     Problem List Patient Active Problem List   Diagnosis Date Noted  . Chronic pain of left knee 10/05/2020  . Polydipsia 07/07/2020  . Orthopnea 07/07/2020  . Left knee injury, subsequent encounter 01/14/2020  . Carpal tunnel syndrome 10/23/2019  . Trigger finger, right 08/14/2019  . 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 Pneumonia due to COVID-19 virus 06/18/2019  . Acute respiratory failure with hypoxia (HCC) 06/18/2019  . Renal insufficiency 06/18/2019  . Hyperkalemia 10/30/2018  . Posterior knee pain, right 02/21/2018  . Essential hypertension 01/30/2018  . Mass on back 01/30/2018  . Cervical radiculopathy 04/30/2017  . Left wrist pain 11/28/2016  . Pre-diabetes 02/16/2016  . OSA (obstructive sleep apnea) 05/14/2015  . Chronic lumbar radiculopathy 11/03/2014  . Other allergic rhinitis 09/07/2014  . Health care maintenance 09/07/2014  . OBESITY, MORBID 09/20/2006  . Anemia, iron deficiency 08/09/2006    Gaven Eugene 11/29/2020, 10:31 AM  [facility] [address] [place], [place], [address] Phone: [phone]   Fax:  [phone]  Name: [name] Segers  [name] MRN: [id number] Date of Birth: 1971/04/17

## 2020-12-02 DIAGNOSIS — G4733 Obstructive sleep apnea (adult) (pediatric): Secondary | ICD-10-CM | POA: Diagnosis not present

## 2020-12-06 ENCOUNTER — Ambulatory Visit: Payer: Medicaid Other | Admitting: Physical Therapy

## 2020-12-09 ENCOUNTER — Other Ambulatory Visit: Payer: Self-pay

## 2020-12-09 ENCOUNTER — Ambulatory Visit (INDEPENDENT_AMBULATORY_CARE_PROVIDER_SITE_OTHER): Payer: Medicaid Other | Admitting: Student

## 2020-12-09 ENCOUNTER — Encounter: Payer: Self-pay | Admitting: Student

## 2020-12-09 VITALS — BP 121/82 | HR 94 | Temp 98.2°F | Ht 63.0 in | Wt 281.7 lb

## 2020-12-09 DIAGNOSIS — R0601 Orthopnea: Secondary | ICD-10-CM | POA: Diagnosis not present

## 2020-12-09 DIAGNOSIS — E119 Type 2 diabetes mellitus without complications: Secondary | ICD-10-CM | POA: Diagnosis not present

## 2020-12-09 DIAGNOSIS — I10 Essential (primary) hypertension: Secondary | ICD-10-CM | POA: Diagnosis not present

## 2020-12-09 DIAGNOSIS — M5416 Radiculopathy, lumbar region: Secondary | ICD-10-CM | POA: Diagnosis not present

## 2020-12-09 DIAGNOSIS — R7303 Prediabetes: Secondary | ICD-10-CM

## 2020-12-09 DIAGNOSIS — G4733 Obstructive sleep apnea (adult) (pediatric): Secondary | ICD-10-CM | POA: Diagnosis not present

## 2020-12-09 DIAGNOSIS — M25562 Pain in left knee: Secondary | ICD-10-CM

## 2020-12-09 DIAGNOSIS — D508 Other iron deficiency anemias: Secondary | ICD-10-CM

## 2020-12-09 DIAGNOSIS — G8929 Other chronic pain: Secondary | ICD-10-CM | POA: Diagnosis not present

## 2020-12-09 LAB — GLUCOSE, CAPILLARY: Glucose-Capillary: 121 mg/dL — ABNORMAL HIGH (ref 70–99)

## 2020-12-09 LAB — POCT GLYCOSYLATED HEMOGLOBIN (HGB A1C): Hemoglobin A1C: 7 % — AB (ref 4.0–5.6)

## 2020-12-09 MED ORDER — CELECOXIB 100 MG PO CAPS: 100.0000 mg | ORAL_CAPSULE | Freq: Two times a day (BID) | ORAL | 0 refills | Status: DC

## 2020-12-09 MED ORDER — CYCLOBENZAPRINE HCL 5 MG PO TABS
ORAL_TABLET | ORAL | 0 refills | Status: DC
Start: 2020-12-09 — End: 2021-04-28

## 2020-12-09 MED ORDER — TRULICITY 0.75 MG/0.5ML ~~LOC~~ SOAJ: 0.7500 mg | SUBCUTANEOUS | 0 refills | Status: DC

## 2020-12-09 NOTE — Assessment & Plan Note (Addendum)
BP at goal, 121/82 today. The patient denies chest pain, visual changes, weakness, dizziness on standing. Does endorse morning headaches. Reports she has not been using her CPAP in 1.5 yrs (see OSA problem-based A&P).   Plan: - continue lisinopril 10 mg daily

## 2020-12-09 NOTE — Patient Instructions (Addendum)
Ms.Montez E Lemler,   Thank you for your visit to the [facility] today. It was a pleasure meeting you. Today we discussed the following:  1) Diabetes, weight gain - Your A1c was 7.0% today, increased from 6.7% at your last visit - I have placed a referral to our diabetes educator, Lupita Leash - I have prescribed you a weekly injection called Trulicity which will help with weight loss as well as your diabetes - You will need to follow-up in 78-month for reevaluation and further titration of the medicine  2) Left knee pain - I have refilled your Celebrex and Flexeril. These will hopefully be short-term medications as you continue to work with physical therapy  - I would like you to schedule follow-up with your orthopedic surgeon to discuss the ongoing pain you are having  3) Sleep apnea, fatigue - Start back using your CPAP! - I have ordered a CBC and ferritin to evaluate for iron deficiency. I will call you with the result.    We would like to see you back in 1 month for DM follow-up. I will not be in clinic but one of my colleagues will see you. Please bring all of your medications with you.   If you have any questions or concerns, please call our clinic at [phone] between 9am-5pm. Outside of these hours, call [phone] and ask for the internal medicine resident on call. If you feel you are having a medical emergency please call 911.

## 2020-12-09 NOTE — Assessment & Plan Note (Signed)
Patient still endorsing symptoms of sleep apnea, however is no longer having orthopnea or lower extremity edema. As discussed in OSA problem-based A&P, she has not been consistently using her CPAP for the last 1.5 years.  Echocardiogram was ordered after last visit on 07/08/20 to evaluate for possible heart failure, especially in the setting of her recent start of celebrex, and results of the echo on 08/05/20 were unremarkable, LVEF 60-65%.   On exam today she has trace pitting edema on the RLE to the mid shin.  A/P: At this time suspect untreated OSA likely lead to patient's symptoms. Her echo was reassuringly normal. Counseled patient to restart CPAP and will continue to monitor.  - restart CPAP

## 2020-12-09 NOTE — Assessment & Plan Note (Signed)
A1c is 7.0% which is increased from 6.7% 5 months ago, and up from 5.8 about 1.5 years ago. Patient has also had a 21 lb weight gain (260 lb -> 281 lb) over the last year. She states she feels like she eats emotionally, and her knee pain has prevented her from being as active as she would like. PHQ-9 of 3 today, she states her mood is "good" though notes stressors of caring for her household and mother. Reports she previously saw Lupita Leash and would be interested in another referral.   A/P: Patient with history of pre-diabetes now with diabetes as of last visit 5 months ago. Has also had 21 lb weight gain over the last year. She expresses interest in starting a medication and given her obesity and previous GI intolerance with metformin, will start Trulicity.  - Referral to diabetes educator, Lupita Leash - Will start patient on once-weekly GLP-1 agonist injection, Trulicity, which is on the Medicaid preferred list. Start at 0.75 mg weekly.  -Patient was counseled on using Trulicity by our clinic pharmacist. Instructed to follow-up in one month to ensure she is tolerating the medication and for further titration.

## 2020-12-09 NOTE — Assessment & Plan Note (Signed)
Patient reports she has not used her CPAP consistently since she had COVID ~1.5 years ago. States at that time she felt like the CPAP made her COVID symptoms worse and then she got out of the habit of using it. Also is in the process of having certain parts replaced. Had a lengthy discussion with the patient regarding the importance of starting back on CPAP regularly to treat hypertension, weight gain, and lower risk of heart attack, stroke, and lung disease.   Plan: - Urged patient to consistently use her CPAP

## 2020-12-09 NOTE — Assessment & Plan Note (Addendum)
Patient reports history of iron deficiency anemia. She is not on iron supplementation. Last labs from 9 months ago without anemia. Patient reports she has not had a menstrual cycle in a few years, denies hot flashes, mood swings or other symptoms of menopause. She requests repeat labs to evaluate for iron deficiency.   A/P: It is reasonable to screen for iron deficiency given the patient's history of IDA and ongoing fatigue, though untreated OSA is very likely contributing. If she is iron deficient, would pursue further work-up for underlying etiology. - CBC, ferritin  ADDENDUM (12/16/20) - CBC within normal limits with Hgb 13.5. Ferritin 140. No evidence of iron deficiency or anemia. Called to discuss result with patient.

## 2020-12-09 NOTE — Assessment & Plan Note (Signed)
Patient still with persistent left knee pain following meniscal repair in 02/2020, however she reports it has improved since starting physical therapy following referral after her last visit 2 months ago (10/05/20). She requests refills of her celecoxib and Flexeril. Counseled patient that these medications have significant side effects and so we would like to minimize their use in the long run. Given her ongoing pain, recommend return to ortho for evaluation to rule out need for any additional invasive procedures.  Plan: - Counseled patient to follow-up with ortho - Refilled celecoxib 100 mg twice daily, 14-day supply - Refilled Flexeril 5 mg once daily PRN for muscle spasm, 30-day supply

## 2020-12-09 NOTE — Progress Notes (Signed)
   CC: follow-up on chronic medical conditions  HPI:  [name] is a 50 y.o. woman with history as below who presents to clinic for follow-up of her chronic medical conditions. Her last clinic visit was on 10/04/20 with Dr. Chesley Mires.   To see the details of this patient's management of their acute and chronic problems, please refer to the Assessment & Plan under the Encounters tab.    Past Medical History:  Diagnosis Date  . Anemia   . Anxiety   . Arthritis    "left knee"  . Chronic lower back pain    "on the left side"  . Headache(784.0) 02/29/12   "frequent"  . Hypertension   . Left knee pain 11/04/2015  . Migraines   . Pneumonia 2006  . Renal insufficiency 06/18/2019  . Sleep apnea    CPAP  . Stress at home    "and at work"   Review of Systems:    Review of Systems  Constitutional: Positive for malaise/fatigue. Negative for weight loss.  Respiratory: Negative for shortness of breath.   Cardiovascular: Negative for chest pain, orthopnea, leg swelling and PND.  Musculoskeletal: Positive for joint pain.  Skin: Negative for rash.  Neurological: Positive for headaches. Negative for dizziness and weakness.  Psychiatric/Behavioral: Negative for depression.    Physical Exam:  Vitals:   12/09/20 1315  BP: 121/82  Pulse: 94  Temp: 98.2 F (36.8 C)  TempSrc: Oral  SpO2: 99%  Weight: 281 lb 11.2 oz (127.8 kg)  Height: 5\' 3"  (1.6 m)   Constitutional: obese, well-appearing woman sitting in chair, in no acute distress HENT: normocephalic atraumatic, mucous membranes moist Eyes: conjunctiva non-erythematous Neck: supple Cardiovascular: regular rate and rhythm, no m/r/g; trace pitting edema of her RLE to the mid-shin Pulmonary/Chest: normal work of breathing on room air, lungs clear to auscultation bilaterally Abdominal: soft, non-tender, non-distended MSK: normal bulk and tone Neurological: alert & oriented x 3, slightly antalgic gait Skin: warm and  dry Psych: normal mood and affect    Assessment & Plan:   See Encounters Tab for problem-based charting.  Patient discussed with Dr. 

## 2020-12-10 LAB — CBC WITH DIFFERENTIAL/PLATELET
Basophils Absolute: 0.1 10*3/uL (ref 0.0–0.2)
Basos: 1 %
EOS (ABSOLUTE): 0.5 10*3/uL — ABNORMAL HIGH (ref 0.0–0.4)
Eos: 5 %
Hematocrit: 41.6 % (ref 34.0–46.6)
Hemoglobin: 13.5 g/dL (ref 11.1–15.9)
Immature Grans (Abs): 0 10*3/uL (ref 0.0–0.1)
Immature Granulocytes: 1 %
Lymphocytes Absolute: 3 10*3/uL (ref 0.7–3.1)
Lymphs: 35 %
MCH: 25.7 pg — ABNORMAL LOW (ref 26.6–33.0)
MCHC: 32.5 g/dL (ref 31.5–35.7)
MCV: 79 fL (ref 79–97)
Monocytes Absolute: 0.6 10*3/uL (ref 0.1–0.9)
Monocytes: 7 %
Neutrophils Absolute: 4.6 10*3/uL (ref 1.4–7.0)
Neutrophils: 51 %
Platelets: 475 10*3/uL — ABNORMAL HIGH (ref 150–450)
RBC: 5.26 x10E6/uL (ref 3.77–5.28)
RDW: 14.5 % (ref 11.7–15.4)
WBC: 8.8 10*3/uL (ref 3.4–10.8)

## 2020-12-10 LAB — FERRITIN: Ferritin: 140 ng/mL (ref 15–150)

## 2020-12-10 NOTE — Progress Notes (Signed)
Internal Medicine Clinic Attending  Case discussed with Dr. Watson at the time of the visit.  We reviewed the resident's history and exam and pertinent patient test results.  I agree with the assessment, diagnosis, and plan of care documented in the resident's note.  Alexander Raines, M.D., Ph.D.  

## 2020-12-13 ENCOUNTER — Encounter: Payer: Self-pay | Admitting: Physical Therapy

## 2020-12-13 ENCOUNTER — Telehealth: Payer: Self-pay | Admitting: Student

## 2020-12-13 ENCOUNTER — Ambulatory Visit: Payer: Medicaid Other | Admitting: Physical Therapy

## 2020-12-13 ENCOUNTER — Other Ambulatory Visit: Payer: Self-pay

## 2020-12-13 DIAGNOSIS — G8929 Other chronic pain: Secondary | ICD-10-CM

## 2020-12-13 DIAGNOSIS — M25562 Pain in left knee: Secondary | ICD-10-CM | POA: Diagnosis not present

## 2020-12-13 DIAGNOSIS — M6281 Muscle weakness (generalized): Secondary | ICD-10-CM

## 2020-12-13 NOTE — Therapy (Addendum)
[facility] [address] [place], [place], [address] Phone: [phone]   Fax:  [phone]  Physical Therapy Treatment  Patient Details  Name: [name] [name] MRN: [id number] Date of Birth: 1971-07-25 Referring Provider (PT): Anne Shutter, MD   Encounter Date: 12/13/2020   PT End of Session - 12/13/20 0958     Visit Number 3    Number of Visits 6    Date for PT Re-Evaluation 12/20/20    Authorization Type [place] MEDICAID WELLCARE    PT Start Time 1000    PT Stop Time 1045    PT Time Calculation (min) 45 min    Activity Tolerance Patient tolerated treatment well;Patient limited by pain    Behavior During Therapy [facility] for tasks assessed/performed             Past Medical History:  Diagnosis Date   Acute respiratory failure with hypoxia (HCC) 06/18/2019   Anemia    Anxiety    Arthritis    "left knee"   Chronic lower back pain    "on the left side"   Headache(784.0) 02/29/12   "frequent"   Hyperkalemia 10/30/2018   Hypertension    Left knee pain 11/04/2015   Migraines    Pneumonia 2006   Post viral syndrome 07/17/2019   Renal insufficiency 06/18/2019   Sleep apnea    CPAP   Stress at home    "and at work"    Past Surgical History:  Procedure Laterality Date   BREAST BIOPSY Left 07/29/2013   CESAREAN SECTION  08/2008   CHOLECYSTECTOMY  03/02/2012   Procedure: LAPAROSCOPIC CHOLECYSTECTOMY;  Surgeon: Cherylynn Ridges, MD;  Location: [facility] OR;  Service: General;;   KNEE ARTHROSCOPY WITH MENISCAL REPAIR Right 06/25/2018   Procedure: RIGHT KNEE ARTHROSCOPY WITH MENISCAL ROOT REPAIR;  Surgeon: Cammy Copa, MD;  Location: [facility] OR;  Service: Orthopedics;  Laterality: Right;   KNEE ARTHROSCOPY WITH MENISCAL REPAIR Left 03/09/2020   Procedure: LEFT KNEE ARTHROSCOPY, MEDIAL MENISCAL ROOT REPAIR;  Surgeon: Cammy Copa, MD;  Location: [facility] OR;  Service: Orthopedics;  Laterality: Left;   WISDOM TOOTH EXTRACTION       There were no vitals filed for this visit.   Subjective Assessment - 12/13/20 1001     Subjective My knee has been feeling better with all of the exercises. I think the pain might still be due to the arthritis. We just walked around the block yesterday, haven't increased distance at all. I am still having difficulty going down the stairs. With walking, I usually try to walk around my living room 2x/week and then outside around the block 2x/week.    Currently in Pain? Yes    Pain Score 2     Pain Location Knee    Pain Orientation Left    Pain Descriptors / Indicators Aching    Pain Type Chronic pain    Pain Onset More than a month ago    Pain Frequency Constant                                               [facility] Adult PT Treatment/Exercise - 12/13/20 0001       Self-Care   Self-Care Other Self-Care Comments    Other Self-Care Comments  progressive overload with walking and PNE cards      Exercises  Exercises Knee/Hip      Knee/Hip Exercises: Stretches   Passive Hamstring Stretch Left;3 reps;30 seconds    Passive Hamstring Stretch Limitations knee in full extension      Knee/Hip Exercises: Aerobic   Nustep L3 x5 min LE, slow movement noted      Knee/Hip Exercises: Standing   Heel Raises 10 reps    Heel Raises Limitations pain with full heel raise, cued to not go into pain    Hip Abduction 2 sets;10 reps    Abduction Limitations only on L LE SLS on L LE caused paincues to keep torso straight    Hip Extension 2 sets   20   Extension Limitations only on L LE, SLS on L LE caused paincues to keep torso straight      Knee/Hip Exercises: Seated   Long Arc Quad 20 reps;2 sets    [employer] Weight 2 lbs.    Long [employer] Limitations full extension caused 'crunching', instructed not to go to full extension if causing pain      Knee/Hip Exercises: Supine   Heel Slides Left;15 reps    Heel Slides Limitations increased range since last visit    Other  Supine Knee/Hip Exercises clamshells blue 2x10                          PT Education - 12/13/20 0958     Education Details HEP update    Person(s) Educated Patient    Methods Explanation;Demonstration;Tactile cues;Verbal cues;Handout    Comprehension Verbalized understanding;Returned demonstration;Need further instruction              PT Short Term Goals - 12/13/20 1110       PT SHORT TERM GOAL #1   Title Pt will be independent with initial HEP.    Status Achieved      PT SHORT TERM GOAL #2   Title Pt will report </= 5/10 pain with activity to reduce functional limitations    Baseline not assessed this session, but still above 5/10    Status On-going                PT Long Term Goals - 11/08/20 1136       PT LONG TERM GOAL #1   Title Pt will achieve 112 L knee AROM without an increase in pain in order to help with ambulation and stair navigation.    Baseline 68 deg    Time 6    Period Weeks    Status New    Target Date 12/20/20      PT LONG TERM GOAL #2   Title Pt will have 5/5 L hip MMTs in order to help increase functional activity.    Baseline Patient exhibits bilateral strength deficits of hips    Time 6    Period Weeks    Status New    Target Date 12/20/20      PT LONG TERM GOAL #3   Title Pt will have L knee flex/ext 4/5 MMT in order to help decrease pain and increase functional ability.    Baseline Patient exhibits left knee strength deficits    Time 6    Period Weeks    Status New    Target Date 12/20/20      PT LONG TERM GOAL #4   Title Patient will be I with final HEP to maintain progress from PT    Baseline Provided at evaluation  Time 6    Period Weeks    Status New    Target Date 12/20/20                        Plan - 12/13/20 0959     Clinical Impression Statement Pt tolerated PT with no adverse effects. Education was given on progressive overload with walking and exercises. Pt was instructed to increase  walking distance this week (was agreed upon that she would go to the track and increase ambulation between today and next appointment). Pt noticed crepitus with OKC exercises, but this was not always accompanied with pain, so education was given that crepitus/arthritis/swelling does not equal damage. PNE cards and yellow flags was addressed this session and brainstorming ways to reduce stress was done. Exercises progressing hip strength in CKC and OKC were done. Pt was noted to be able to fully extend knee during standing hip exercises as well as passive hamstring stretch this session. Discussion of aquatic therapy was done this session to help compound benefits of land based exercise. Pt continues to benefit from skilled PT in order to help guide with progressive overload during ambulation and exercise, progress OKC and CKC LE exercises, and increase L knee ROM in order to increase community ambulation, decrease pain, and increase QOL.    PT Treatment/Interventions ADLs/Self Care Home Management;Aquatic Therapy;Electrical Stimulation;Moist Heat;Stair training;Gait training;Functional mobility training;Therapeutic activities;Therapeutic exercise;Balance training;Patient/family education;Manual techniques;Passive range of motion    PT Next Visit Plan update POC, progress standing exercise, ask how much she was walked since last time,    PT Home Exercise Plan [email or web address]    Consulted and Agree with Plan of Care Patient             Patient will benefit from skilled therapeutic intervention in order to improve the following deficits and impairments:  Abnormal gait,Difficulty walking,Decreased range of motion,Pain,Decreased activity tolerance,Decreased balance,Decreased strength,Decreased mobility  Visit Diagnosis: Chronic pain of left knee  Muscle weakness (generalized)      Problem List Patient Active Problem List   Diagnosis Date Noted   Chronic pain of left knee 10/05/2020   Polydipsia  07/07/2020   Orthopnea 07/07/2020   Left knee injury, subsequent encounter 01/14/2020   Carpal tunnel syndrome 10/23/2019   Trigger finger, right 08/14/2019   Pneumonia due to COVID-19 virus 06/18/2019   Renal insufficiency 06/18/2019   Posterior knee pain, right 02/21/2018   Essential hypertension 01/30/2018   Mass on back 01/30/2018   Cervical radiculopathy 04/30/2017   Left wrist pain 11/28/2016   Type 2 diabetes mellitus treated without insulin (HCC) 02/16/2016   OSA (obstructive sleep apnea) 05/14/2015   Chronic lumbar radiculopathy 11/03/2014   Other allergic rhinitis 09/07/2014   Health care maintenance 09/07/2014   OBESITY, MORBID 09/20/2006   Anemia, iron deficiency 08/09/2006    Jeri Cos, SPT 12/13/2020, 11:11 AM  [facility] [address] [place], [place], [address] Phone: [phone]   Fax:  [phone]  Name: [name] [name] MRN: [id number] Date of Birth: 03-07-1971

## 2020-12-13 NOTE — Telephone Encounter (Signed)
Refill Request   Dulaglutide (TRULICITY) 0.75 MG/0.5ML [facility] - [place], [place] - [address] (Ph: [phone])

## 2020-12-13 NOTE — Patient Instructions (Signed)
Access Code: [email or web address] URL: [email or web address] Date: 12/13/2020 Prepared by: Jeri Cos  Exercises Hooklying Heel Slide - 1 x daily - 7 x weekly - 2-3 sets - 10 reps Hook Lying Single Knee to Chest Stretch with Towel - 1 x daily - 7 x weekly - 2-3 reps - 30 seconds hold Hooklying Clamshell with Resistance - 1 x daily - 7 x weekly - 2-3 sets - 10 reps Seated Long Arc Quad - 1 x daily - 7 x weekly - 2-3 sets - 10 reps Seated Hamstring Stretch - 1 x daily - 7 x weekly - 2-3 reps - 30 seconds hold Standing Hip Abduction with Counter Support - 1 x daily - 7 x weekly - 2-3 sets - 10 reps Standing Hip Extension with Counter Support - 1 x daily - 7 x weekly - 2-3 sets - 10 reps Standing Romberg to 3/4 Tandem Stance - 1 x daily - 7 x weekly - 3 sets - 30 hold Heel rises with counter support - 1 x daily - 7 x weekly - 2 sets - 10 reps

## 2020-12-13 NOTE — Telephone Encounter (Signed)
Pt called / informed Trulicity was refilled on 12/09/20. I called [facility] - Tresa Endo stated Trulicity needs a PA. I will send this message to [facility] herbin.

## 2020-12-16 ENCOUNTER — Telehealth: Payer: Self-pay | Admitting: *Deleted

## 2020-12-16 NOTE — Telephone Encounter (Signed)
Information to CoverMyMeds for PA for Trulicity.  Approved thru 09/24/2038.  Angelina Ok, RN 12/17/18

## 2020-12-20 ENCOUNTER — Ambulatory Visit: Payer: Medicaid Other | Admitting: Physical Therapy

## 2020-12-23 ENCOUNTER — Encounter: Payer: Self-pay | Admitting: Internal Medicine

## 2020-12-24 DIAGNOSIS — Z419 Encounter for procedure for purposes other than remedying health state, unspecified: Secondary | ICD-10-CM | POA: Diagnosis not present

## 2020-12-27 ENCOUNTER — Other Ambulatory Visit: Payer: Self-pay

## 2020-12-27 ENCOUNTER — Ambulatory Visit: Payer: Medicaid Other | Attending: Internal Medicine | Admitting: Physical Therapy

## 2020-12-27 ENCOUNTER — Encounter: Payer: Self-pay | Admitting: Physical Therapy

## 2020-12-27 DIAGNOSIS — M6281 Muscle weakness (generalized): Secondary | ICD-10-CM | POA: Diagnosis not present

## 2020-12-27 DIAGNOSIS — M25562 Pain in left knee: Secondary | ICD-10-CM | POA: Insufficient documentation

## 2020-12-27 DIAGNOSIS — G8929 Other chronic pain: Secondary | ICD-10-CM | POA: Insufficient documentation

## 2020-12-27 NOTE — Therapy (Addendum)
[place], [place], [address] Phone: [phone]   Fax:  [phone]  Physical Therapy Treatment/ERO / Discharge  Patient Details  Name: [name] [name] MRN: [id number] Date of Birth: 08-Jan-1971 Referring Provider (PT): Oda Kilts, MD   Encounter Date: 12/27/2020   PT End of Session - 12/27/20 1045     Visit Number 4    Number of Visits 11    Date for PT Re-Evaluation 01/31/21    Authorization Type [place] MEDICAID [facility]    Authorization Time Period 11/29/2020 - 01/28/2021    Authorization - Visit Number 3    Authorization - Number of Visits 14    PT Start Time 0223    PT Stop Time 3612    PT Time Calculation (min) 40 min    Activity Tolerance Patient tolerated treatment well    Behavior During Therapy [facility] for tasks assessed/performed             Past Medical History:  Diagnosis Date   Acute respiratory failure with hypoxia ([place]) 06/18/2019   Anemia    Anxiety    Arthritis    "left knee"   Chronic lower back pain    "on the left side"   Headache(784.0) 02/29/12   "frequent"   Hyperkalemia 10/30/2018   Hypertension    Left knee pain 11/04/2015   Migraines    Pneumonia 2006   Post viral syndrome 07/17/2019   Renal insufficiency 06/18/2019   Sleep apnea    CPAP   Stress at home    "and at work"    Past Surgical History:  Procedure Laterality Date   BREAST BIOPSY Left 07/29/2013   CESAREAN SECTION  08/2008   CHOLECYSTECTOMY  03/02/2012   Procedure: LAPAROSCOPIC CHOLECYSTECTOMY;  Surgeon: Gwenyth Ober, MD;  Location: [place];  Service: General;;   KNEE ARTHROSCOPY WITH MENISCAL REPAIR Right 06/25/2018   Procedure: RIGHT KNEE ARTHROSCOPY WITH MENISCAL ROOT REPAIR;  Surgeon: Meredith Pel, MD;  Location: [place];  Service: Orthopedics;  Laterality: Right;   KNEE ARTHROSCOPY WITH MENISCAL REPAIR Left 03/09/2020   Procedure: LEFT KNEE ARTHROSCOPY, MEDIAL MENISCAL ROOT REPAIR;  Surgeon:  Meredith Pel, MD;  Location: [place];  Service: Orthopedics;  Laterality: Left;   WISDOM TOOTH EXTRACTION      There were no vitals filed for this visit.   Subjective Assessment - 12/27/20 1011     Subjective I haven't been able to keep upwith my exercises the past week, I was sick with bad side effects from the medications I am on.    Patient Stated Goals decrease pain    Currently in Pain? Yes    Pain Score 2     Pain Location Knee    Pain Orientation Left    Pain Descriptors / Indicators Aching    Pain Type Chronic pain    Pain Onset More than a month ago    Pain Frequency Constant                  [facility] PT Assessment - 12/27/20 0001       Assessment   Medical Diagnosis Chronic L Knee pain s/p meniscal repair    Referring Provider (PT) Oda Kilts, MD      Precautions   Precautions None      Restrictions   Weight Bearing Restrictions No      Balance Screen   Has the patient fallen in the past 6  months No      Prior Function   Level of Independence Independent      AROM   Left Knee Flexion 105      Strength   Left Hip Extension 4-/5    Left Hip ABduction 4+/5    Left Knee Flexion 4+/5    Left Knee Extension 4/5      Ambulation/Gait   Gait Comments antalgic gait arriving                                        [facility] Adult PT Treatment/Exercise - 12/27/20 0001       Self-Care   Self-Care Other Self-Care Comments    Other Self-Care Comments  long term goal/short term goal assessment      Knee/Hip Exercises: Aerobic   Nustep L4x5 min LE, slow movement      Knee/Hip Exercises: Standing   Heel Raises --   12 reps   Heel Raises Limitations pain with full heel raise, cued to not go into pain    Knee Flexion 10 reps;Left    Hip Abduction 2 sets;10 reps    Abduction Limitations only on L LE SLS on L LE caused paincues to keep torso straight    Hip Extension 2 sets;10 reps    Extension Limitations only on L LE, SLS on L LE  caused paincues to keep torso straight    Other Standing Knee Exercises weight shifts x10 working towards 100% weightbearing on L LE; 3/4 tandem 2x30 seconds bilateral      Knee/Hip Exercises: Seated   Long Arc Quad 2 sets;10 reps    Long Arc Quad Weight 3 lbs.   and then yellow band   Long Arc Quad Limitations able to achieve full extension; one set done with yellow band in order to translate towards at home    Hamstring Curl 10 reps    Hamstring Limitations yellow band      Knee/Hip Exercises: Supine   Short Arc Quad Sets 10 reps    Heel Slides Left;10 reps    Heel Slides Limitations 105 degrees after    Bridges --   3 reps   Bridges Limitations pain in back, d/c    Straight Leg Raises --   8 reps   Straight Leg Raises Limitations extension lag noted                          PT Education - 12/27/20 1052     Education Details POC and HEP update, add yellow band resistance to LAQ    Person(s) Educated Patient    Methods Explanation;Demonstration;Handout;Verbal cues    Comprehension Verbalized understanding;Returned demonstration;Need further instruction              PT Short Term Goals - 12/27/20 1012       PT SHORT TERM GOAL #1   Title Pt will be independent with initial HEP.    Status Achieved      PT SHORT TERM GOAL #2   Title Pt will report </= 5/10 pain with activity to reduce functional limitations    Baseline 2/10-4/10    Status Achieved                PT Long Term Goals - 12/27/20 1013       PT LONG TERM GOAL #1   Title  Pt will achieve 112 L knee AROM without an increase in pain in order to help with ambulation and stair navigation.    Baseline 105 degrees    Time 5    Period Weeks    Status On-going    Target Date 01/31/21      PT LONG TERM GOAL #2   Title Pt will have 5/5 L hip MMTs in order to help increase functional activity.    Baseline L hip extension 4-/5, L abduction 4+/5    Time 5    Period Weeks    Status On-going     Target Date 01/31/21      PT LONG TERM GOAL #3   Title Pt will have L knee flex/ext 4/5 MMT in order to help decrease pain and increase functional ability.    Baseline L knee ext 4/5 and painful, L knee flex 4+/5    Time 5    Period Weeks    Status Achieved    Target Date 01/31/21      PT LONG TERM GOAL #4   Title Patient will be I with final HEP to maintain progress from PT    Baseline final HEP not given butindependent with current HEP    Time 5    Period Weeks    Status On-going    Target Date 01/31/21                        Plan - 12/27/20 1056     Clinical Impression Statement Pt tolerated PT session with no adverse effects. Pt noted extension lag with SLR, so LAQ further progressed. Further standing OKC exercises progressed in order to work towards more functional strength progression. Weight shifts and 3/4 tandem (full tandem too painful) introduced this session to help with weight acceptance onto L LE in order to help decrease antalgic gait. Pt has achieved all STG and only 1 LTG, so pts POC has been extended for 5 weeks. Continues to benefit from skilled PT in order to address the above impairements, work towards [place], and further educate on PNE in order to increase ambulation ability and increase QOL.    PT Frequency 1x / week    PT Duration Other (comment)   5 weeks   PT Treatment/Interventions ADLs/Self Care Home Management;Aquatic Therapy;Electrical Stimulation;Moist Heat;Stair training;Gait training;Functional mobility training;Therapeutic activities;Therapeutic exercise;Balance training;Patient/family education;Manual techniques;Passive range of motion    PT Next Visit Plan progress standing exercise and work on more weightbearing on L LE, ask how much she was walked since last time, L hip flexion MMT    PT [place] and Agree with Plan of Care Patient             Patient will benefit from skilled therapeutic  intervention in order to improve the following deficits and impairments:  Abnormal gait,Difficulty walking,Decreased range of motion,Pain,Decreased activity tolerance,Decreased balance,Decreased strength,Decreased mobility  Visit Diagnosis: Chronic pain of left knee  Muscle weakness (generalized)      Problem List Patient Active Problem List   Diagnosis Date Noted   Chronic pain of left knee 10/05/2020   Polydipsia 07/07/2020   Orthopnea 07/07/2020   Left knee injury, subsequent encounter 01/14/2020   Carpal tunnel syndrome 10/23/2019   Trigger finger, right 08/14/2019   Pneumonia due to COVID-19 virus 06/18/2019   Renal insufficiency 06/18/2019   Posterior knee pain, right 02/21/2018   Essential hypertension 01/30/2018   Mass on  back 01/30/2018   Cervical radiculopathy 04/30/2017   Left wrist pain 11/28/2016   Type 2 diabetes mellitus treated without insulin ([place]) 02/16/2016   OSA (obstructive sleep apnea) 05/14/2015   Chronic lumbar radiculopathy 11/03/2014   Other allergic rhinitis 09/07/2014   Health care maintenance 09/07/2014   OBESITY, MORBID 09/20/2006   Anemia, iron deficiency 08/09/2006    Caleb Popp, SPT 12/27/2020, 11:47 AM  [facility] [address] [place], [place], [address] Phone: [phone]   Fax:  [phone]  Name: [name] [name] MRN: [id number] Date of Birth: 07-08-1971   PHYSICAL THERAPY DISCHARGE SUMMARY  Visits from Start of Care: 4  Current functional level related to goals / functional outcomes: See above   Remaining deficits: See above   Education / Equipment: HEP   Patient agrees to discharge. Patient goals were not met. Patient is being discharged due to not returning since the last visit.

## 2020-12-27 NOTE — Patient Instructions (Signed)
Access Code: [email or web address] URL: [email or web address] Date: 12/27/2020 Prepared by: Jeri Cos  Exercises Hooklying Heel Slide - 1 x daily - 7 x weekly - 2-3 sets - 10 reps Hook Lying Single Knee to Chest Stretch with Towel - 1 x daily - 7 x weekly - 2-3 reps - 30 seconds hold Hooklying Clamshell with Resistance - 1 x daily - 7 x weekly - 2-3 sets - 10 reps Seated Long Arc Quad - 1 x daily - 7 x weekly - 2-3 sets - 10 reps Seated Hamstring Stretch - 1 x daily - 7 x weekly - 2-3 reps - 30 seconds hold Standing Hip Abduction with Counter Support - 1 x daily - 7 x weekly - 2-3 sets - 10 reps Standing Hip Extension with Counter Support - 1 x daily - 7 x weekly - 2-3 sets - 10 reps Standing Romberg to 3/4 Tandem Stance - 1 x daily - 7 x weekly - 3 sets - 30 hold Heel rises with counter support - 1 x daily - 7 x weekly - 2 sets - 10 reps Standing Knee Flexion AROM with Chair Support - 1 x daily - 7 x weekly - 3 sets - 10 reps Standing Romberg to 3/4 Tandem Stance - 1 x daily - 7 x weekly - 3 sets - 30 seconds hold

## 2021-01-02 DIAGNOSIS — G4733 Obstructive sleep apnea (adult) (pediatric): Secondary | ICD-10-CM | POA: Diagnosis not present

## 2021-01-03 ENCOUNTER — Telehealth: Payer: Self-pay | Admitting: Physical Therapy

## 2021-01-03 ENCOUNTER — Ambulatory Visit: Payer: Medicaid Other | Admitting: Physical Therapy

## 2021-01-03 NOTE — Telephone Encounter (Signed)
Contacted patient due to missed PT appointment. Patient stated she was having stomach issues from medication. Confirmed next scheduled appointment on 01/17/21, patient elected not to schedule any sooner appointments. Reminded patient of attendance policy. Patient expressed understanding.   Rosana Hoes, PT, DPT, LAT, ATC 01/03/21  10:32 AM Phone: [phone] Fax: [phone]

## 2021-01-05 ENCOUNTER — Encounter: Payer: Self-pay | Admitting: Physical Therapy

## 2021-01-05 NOTE — Telephone Encounter (Signed)
I called to explained they probably will not accept resident's name ,has to be one of our Attendings. State she will call them and if she has any problems she will call back.

## 2021-01-11 ENCOUNTER — Telehealth: Payer: Self-pay | Admitting: *Deleted

## 2021-01-11 ENCOUNTER — Ambulatory Visit (INDEPENDENT_AMBULATORY_CARE_PROVIDER_SITE_OTHER): Payer: Medicaid Other | Admitting: Student

## 2021-01-11 ENCOUNTER — Other Ambulatory Visit: Payer: Self-pay

## 2021-01-11 ENCOUNTER — Encounter: Payer: Self-pay | Admitting: Student

## 2021-01-11 VITALS — BP 119/78 | HR 85 | Temp 97.6°F | Ht 65.0 in | Wt 280.7 lb

## 2021-01-11 DIAGNOSIS — E119 Type 2 diabetes mellitus without complications: Secondary | ICD-10-CM | POA: Diagnosis not present

## 2021-01-11 MED ORDER — ACCU-CHEK GUIDE W/DEVICE KIT: PACK | 0 refills | Status: DC

## 2021-01-11 MED ORDER — ACCU-CHEK FASTCLIX LANCETS MISC: 2 refills | Status: DC

## 2021-01-11 MED ORDER — DAPAGLIFLOZIN PROPANEDIOL 5 MG PO TABS: 5.0000 mg | ORAL_TABLET | Freq: Every day | ORAL | 0 refills | Status: DC

## 2021-01-11 MED ORDER — ACCU-CHEK GUIDE VI STRP: ORAL_STRIP | 12 refills | Status: DC

## 2021-01-11 NOTE — Patient Instructions (Signed)
It was a pleasure seeing you in clinic. Today we discussed:   Diabetes: Please stop the trulicity and start taking farxiga 10 mg daily. I have also ordered you a glucose meter.   Follow up in 2-3 months  If you have any questions or concerns, please call our clinic at [phone] between 9am-5pm and after hours call [phone] and ask for the internal medicine resident on call. If you feel you are having a medical emergency please call 911.   Thank you, we look forward to helping you remain healthy!

## 2021-01-11 NOTE — Telephone Encounter (Signed)
Received faxed PA request from pt's pharmacy for farxiga   Request submitted online via cover my meds: see result below Your request has been approved Approved. This drug has been approved. Approved quantity: 30 per 30 day(s). You may fill up to a 34 day supply at a retail pharmacy. You may fill up to a 90 day supply for maintenance drugs, please refer to the formulary for details. Please call the pharmacy to process your prescription claim.  Start date: 01/11/2021 End date: until further notice  Pharmacy made aware:  $0 copay  Pharmacy will inform pt.[name] Spittle Cassady4/19/20224:26 PM

## 2021-01-13 NOTE — Progress Notes (Signed)
   CC: vomiting and diarrhea   HPI:  [name] is a 50 y.o. female with history listed below presents for follow up diabetes medication and vomiting and diarrhea. Please refer to problem based charting for further details and assessment and plan of current problem and chronic medical conditions.   Past Medical History:  Diagnosis Date  . Acute respiratory failure with hypoxia (HCC) 06/18/2019  . Anemia   . Anxiety   . Arthritis    "left knee"  . Chronic lower back pain    "on the left side"  . Headache(784.0) 02/29/12   "frequent"  . Hyperkalemia 10/30/2018  . Hypertension   . Left knee pain 11/04/2015  . Migraines   . Pneumonia 2006  . 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 Renal insufficiency 06/18/2019  . Sleep apnea    CPAP  . Stress at home    "and at work"   Review of Systems:  Negative as per HPI  Physical Exam:  Vitals:   01/11/21 0929  BP: 119/78  Pulse: 85  Temp: 97.6 F (36.4 C)  TempSrc: Oral  SpO2: 99%  Weight: 280 lb 11.2 oz (127.3 kg)  Height: 5\' 5"  (1.651 m)   Constitutional: Appears well-developed and well-nourished. No distress.  HENT: Normocephalic and atraumatic, EOMI, conjunctiva normal, moist mucous membranes Cardiovascular: Normal rate, regular rhythm. Distal pulses intact Respiratory: No respiratory distress, no accessory muscle use.  Effort is normal.  Lungs are clear to auscultation bilaterally. GI: Nondistended, soft, nontender to palpation, normal active bowel sounds, no distension, no organomegaly Musculoskeletal: Normal bulk and tone.  No peripheral edema noted. Neurological: Is alert and oriented x4, no apparent focal deficits noted. Skin: Warm and dry.   Psychiatric: Normal mood and affect.    Assessment & Plan:   See Encounters Tab for problem based charting.  Patient discussed with Dr. 

## 2021-01-13 NOTE — Assessment & Plan Note (Signed)
Reports significant nausea vomiting and diarrhea since starting truilicity 2.13 mg weekly after last visit. Reports she has had 3 weekly doses. After her first dose noticed vomiting and diarrhea short after she had meals. Has been keeping down some water and meals but has had difficult eating due to symptoms. Skipped her fourth dose 2 days ago last dose was 9 days ago and is feeling much better. Denies any vomiting or diarreha today or yesterday. Reports no lightheadedness or dizziness. Notes previously on metformin she had significant abdominal pain and diarrhea. Will stop her trulicity given her inability to tolerate low doses.   Plan Stop Truilicity Start Farxiga 5 mg daily  Patient has not yet met with Butch Penny still report significant sugary beverage and daily soda intake, patient will work to schedule an appointment with diabetes educator  Follow up in 2-3 months

## 2021-01-14 ENCOUNTER — Encounter: Payer: Self-pay | Admitting: Student

## 2021-01-17 ENCOUNTER — Ambulatory Visit: Payer: Medicaid Other | Admitting: Physical Therapy

## 2021-01-17 NOTE — Progress Notes (Signed)
Internal Medicine Clinic Attending ? ?Case discussed with Dr. Liang  At the time of the visit.  We reviewed the resident?s history and exam and pertinent patient test results.  I agree with the assessment, diagnosis, and plan of care documented in the resident?s note. ? ?

## 2021-01-18 ENCOUNTER — Telehealth: Payer: Self-pay | Admitting: Physical Therapy

## 2021-01-18 NOTE — Telephone Encounter (Signed)
Attempted to contact patient due to missed PT appointment on 01/17/21 at 9:15am. Left VM informing patient of the missed appointment and that due to attendance policy her next 2 scheduled appointments would be canceled, and she would be able to call the clinic to schedule 1 visit at a time moving forward. Provided patient with clinic contact information and encouraged her to call to schedule a future appointment.  Rosana Hoes, PT, DPT, LAT, ATC 01/18/21  10:13 AM Phone: [phone] Fax: [phone]

## 2021-01-23 DIAGNOSIS — Z419 Encounter for procedure for purposes other than remedying health state, unspecified: Secondary | ICD-10-CM | POA: Diagnosis not present

## 2021-01-24 ENCOUNTER — Encounter: Payer: Medicaid Other | Admitting: Physical Therapy

## 2021-01-24 ENCOUNTER — Encounter: Payer: Self-pay | Admitting: Internal Medicine

## 2021-01-24 NOTE — Telephone Encounter (Signed)
I left a VM for patient to call back and speak with me if she is still having problems getting the name changed on her insurance card.

## 2021-01-31 ENCOUNTER — Encounter: Payer: Medicaid Other | Admitting: Physical Therapy

## 2021-02-01 DIAGNOSIS — G4733 Obstructive sleep apnea (adult) (pediatric): Secondary | ICD-10-CM | POA: Diagnosis not present

## 2021-02-04 ENCOUNTER — Ambulatory Visit (INDEPENDENT_AMBULATORY_CARE_PROVIDER_SITE_OTHER): Payer: Medicaid Other | Admitting: Internal Medicine

## 2021-02-04 ENCOUNTER — Other Ambulatory Visit: Payer: Self-pay

## 2021-02-04 ENCOUNTER — Encounter: Payer: Self-pay | Admitting: Internal Medicine

## 2021-02-04 VITALS — BP 107/50 | HR 74 | Temp 98.1°F | Ht 65.0 in | Wt 274.8 lb

## 2021-02-04 DIAGNOSIS — E119 Type 2 diabetes mellitus without complications: Secondary | ICD-10-CM | POA: Diagnosis not present

## 2021-02-04 DIAGNOSIS — L309 Dermatitis, unspecified: Secondary | ICD-10-CM | POA: Diagnosis not present

## 2021-02-04 MED ORDER — TRIAMCINOLONE ACETONIDE 0.5 % EX CREA: 1.0000 "application " | TOPICAL_CREAM | Freq: Three times a day (TID) | CUTANEOUS | 0 refills | Status: DC

## 2021-02-04 NOTE — Progress Notes (Signed)
   CC: type 2 diabetes and rash  HPI:[name] is a 49 y.o. female who presents for evaluation of type 2 diabetes and rash. Please see individual problem based A/P for details.  Past Medical History:  Diagnosis Date  . Acute respiratory failure with hypoxia (HCC) 06/18/2019  . Anemia   . Anxiety   . Arthritis    "left knee"  . Chronic lower back pain    "on the left side"  . Headache(784.0) 02/29/12   "frequent"  . Hyperkalemia 10/30/2018  . Hypertension   . Left knee pain 11/04/2015  . Migraines   . Pneumonia 2006  . 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 Renal insufficiency 06/18/2019  . Sleep apnea    CPAP  . Stress at home    "and at work"   Review of Systems:   Review of Systems  Constitutional: Negative for chills and fever.  Skin: Positive for itching and rash.  Endo/Heme/Allergies: Negative for polydipsia.     Physical Exam: Vitals:   02/04/21 1036  BP: (!) 107/50  Pulse: 74  Temp: 98.1 F (36.7 C)  TempSrc: Oral  SpO2: 98%  Weight: 274 lb 12.8 oz (124.6 kg)  Height: 5\' 5"  (1.651 m)   General: Obese middle age female, pleasant. NAD HEENT: Conjunctiva nl , antiicteric sclerae, moist mucous membranes, no exudate or erythema Cardiovascular: Normal rate, regular rhythm.  No murmurs, rubs, or gallops Pulmonary : Equal breath sounds, No wheezes, rales, or rhonchi Ext: No edema in lower extremities, no tenderness to palpation of lower extremities.   Assessment & Plan:   See Encounters Tab for problem based charting.  Patient discussed with Dr. 

## 2021-02-04 NOTE — Patient Instructions (Signed)
Thank you for trusting me with your care. To recap, today we discussed the following:  1. Hand dermatitis  - triamcinolone cream (KENALOG) 0.5 %; Apply 1 application topically 3 (three) times daily.  Dispense: 30 g; Refill: 0  2. Type 2 diabetes mellitus treated without insulin (HCC) Continue current regimen Follow up in 2 months Follow up with Lupita Leash for diabetic education Make monthly goal- goal one cut out soda

## 2021-02-04 NOTE — Assessment & Plan Note (Signed)
   Patient has erythematous base with overlying scale. No known exposure. Assessment: Hand dermatitis, discussed wearing gloves. Drying hands and using hypoallergic lotion if needed. Will treat with steroid cream. Follow up prn.  Plan: - triamcinolone cream (KENALOG) 0.5 %; Apply 1 application topically 3 (three) times daily.  Dispense: 30 g; Refill: 0

## 2021-02-04 NOTE — Assessment & Plan Note (Signed)
  Hgb A1c 7 % at the last visit. Current A1c 6.7 %. Average glucometer reading  over the past 125 days with a high of 148 and low of 113 which was symptomatic. The patient reports eating some food and symptoms improved. Unless blood glucose check had an era I do not suspect symptoms were from hypoglycemia.  The patient denied polyuria and polydipsia. She has been intolerant to metformin at initial dose in the past and Trulicity.    Assessment: Type 2 diabetes, controlled on Farxiga. Encourage to continue to work on diet and exercise. She will schedule her appoitent with diabetic educator.   Plan: Continue current regimen of Farxiga 5 mg  Follow up with Lupita Leash for diabetic education Make monthly goal for weight loss- first months goal is to cut out soda Repeat A1c at next visit if after 3 months from last A1c

## 2021-02-07 NOTE — Progress Notes (Signed)
Internal Medicine Clinic Attending  Case discussed with Dr. Steen  At the time of the visit.  We reviewed the resident's history and exam and pertinent patient test results.  I agree with the assessment, diagnosis, and plan of care documented in the resident's note.  

## 2021-02-11 NOTE — Addendum Note (Signed)
Addended by: Neomia Dear on: 02/11/2021 11:47 AM   Modules accepted: Orders

## 2021-02-16 ENCOUNTER — Other Ambulatory Visit: Payer: Self-pay | Admitting: Student

## 2021-02-16 NOTE — Progress Notes (Signed)
Letter sent as requested, that her pain would make it difficult for her to sit for jury duty

## 2021-02-19 ENCOUNTER — Encounter: Payer: Self-pay | Admitting: *Deleted

## 2021-02-20 ENCOUNTER — Encounter: Payer: Self-pay | Admitting: Internal Medicine

## 2021-02-23 DIAGNOSIS — Z419 Encounter for procedure for purposes other than remedying health state, unspecified: Secondary | ICD-10-CM | POA: Diagnosis not present

## 2021-02-25 ENCOUNTER — Other Ambulatory Visit: Payer: Self-pay | Admitting: Student

## 2021-02-25 ENCOUNTER — Other Ambulatory Visit: Payer: Self-pay | Admitting: Internal Medicine

## 2021-02-25 DIAGNOSIS — M5416 Radiculopathy, lumbar region: Secondary | ICD-10-CM

## 2021-03-25 DIAGNOSIS — Z419 Encounter for procedure for purposes other than remedying health state, unspecified: Secondary | ICD-10-CM | POA: Diagnosis not present

## 2021-03-29 ENCOUNTER — Encounter: Payer: Self-pay | Admitting: *Deleted

## 2021-04-11 ENCOUNTER — Other Ambulatory Visit: Payer: Self-pay | Admitting: Internal Medicine

## 2021-04-11 DIAGNOSIS — M5416 Radiculopathy, lumbar region: Secondary | ICD-10-CM

## 2021-04-14 NOTE — Telephone Encounter (Signed)
Attempted to call patient back x2 to discuss medication refill.  In her last documented note from Dr. Claudette Laws, patient was instructed to follow-up with orthopedics for continued knee pain, however it feels as though she is not.  Flexeril is not usually used as a a long-term pain medication and I believe the patient needs to be evaluated by orthopedics.  Will await to refill until I have this discussion with the patient.

## 2021-04-19 ENCOUNTER — Telehealth: Payer: Self-pay | Admitting: Internal Medicine

## 2021-04-19 NOTE — Telephone Encounter (Signed)
..   Medicaid Managed Care   Unsuccessful Outreach Note  04/19/2021 Name: [name] [name] MRN: [id number] DOB: [birth year]  Referred by: Dellis Filbert, MD Reason for referral : High Risk Managed Medicaid (I called patient today to get her scheduled with the MM team but her phone was not in order.)   An unsuccessful telephone outreach was attempted today. The patient was referred to the case management team for assistance with care management and care coordination.   Follow Up Plan: The care management team will reach out to the patient again over the next 7-14 days.   Weston Settle Care Guide, High Risk Medicaid Managed Care Embedded Care Coordination [facility]  [facility]

## 2021-04-20 ENCOUNTER — Telehealth: Payer: Self-pay | Admitting: *Deleted

## 2021-04-20 MED ORDER — ACCU-CHEK SOFTCLIX LANCETS MISC: 12 refills | Status: DC

## 2021-04-20 NOTE — Telephone Encounter (Signed)
Fax from [employer] - pt needs rx for Accu-chek softclix  (not fastclix which is on pt's med list). Thanks

## 2021-04-25 DIAGNOSIS — Z419 Encounter for procedure for purposes other than remedying health state, unspecified: Secondary | ICD-10-CM | POA: Diagnosis not present

## 2021-04-26 ENCOUNTER — Encounter: Payer: Self-pay | Admitting: Internal Medicine

## 2021-04-27 ENCOUNTER — Telehealth: Payer: Self-pay | Admitting: Internal Medicine

## 2021-04-27 NOTE — Telephone Encounter (Signed)
..   Medicaid Managed Care   Unsuccessful Outreach Note  04/27/2021 Name: GLAYDS INSCO MRN: [id number] DOB: 1971-03-12  Referred by: Dellis Filbert, MD Reason for referral : High Risk Managed Medicaid (I called the patient today to offer her a phone visit with the [facility] Team. I left my name and number on her VM.)   A second unsuccessful telephone outreach was attempted today. The patient was referred to the case management team for assistance with care management and care coordination.   Follow Up Plan: The care management team will reach out to the patient again over the next 7-14 days.   Weston Settle Care Guide, High Risk Medicaid Managed Care Embedded Care Coordination [facility]  [facility]

## 2021-04-28 ENCOUNTER — Other Ambulatory Visit: Payer: Self-pay | Admitting: Student

## 2021-04-28 ENCOUNTER — Telehealth: Payer: Self-pay | Admitting: Internal Medicine

## 2021-04-28 DIAGNOSIS — M5416 Radiculopathy, lumbar region: Secondary | ICD-10-CM

## 2021-04-28 MED ORDER — CYCLOBENZAPRINE HCL 5 MG PO TABS: ORAL_TABLET | ORAL | 0 refills | Status: DC

## 2021-04-28 NOTE — Telephone Encounter (Signed)
..  Patient declines further follow up and engagement by the Managed Medicaid Team. Appropriate care team members and provider have been notified via electronic communication. The Managed Medicaid Team is available to follow up with the patient after provider conversation with the patient regarding recommendation for engagement and subsequent re-referral to the Managed Medicaid Team.    Jennifer Alley Care Guide, High Risk Medicaid Managed Care Embedded Care Coordination [place]  [facility]   

## 2021-05-03 ENCOUNTER — Other Ambulatory Visit: Payer: Self-pay

## 2021-05-03 ENCOUNTER — Encounter (HOSPITAL_COMMUNITY): Payer: Self-pay | Admitting: Emergency Medicine

## 2021-05-03 ENCOUNTER — Ambulatory Visit (HOSPITAL_COMMUNITY)
Admission: EM | Admit: 2021-05-03 | Discharge: 2021-05-03 | Disposition: A | Discharge status: Home / Self Care | Payer: Medicaid Other | Attending: Physician Assistant | Admitting: Physician Assistant

## 2021-05-03 DIAGNOSIS — G8929 Other chronic pain: Secondary | ICD-10-CM | POA: Diagnosis not present

## 2021-05-03 DIAGNOSIS — M25512 Pain in left shoulder: Secondary | ICD-10-CM | POA: Diagnosis not present

## 2021-05-03 DIAGNOSIS — M25562 Pain in left knee: Secondary | ICD-10-CM

## 2021-05-03 DIAGNOSIS — M25561 Pain in right knee: Secondary | ICD-10-CM

## 2021-05-03 MED ORDER — PREDNISONE 10 MG PO TABS: 20.0000 mg | ORAL_TABLET | Freq: Every day | ORAL | 0 refills | Status: AC

## 2021-05-03 NOTE — Discharge Instructions (Signed)
I suspect that your pain is related to a flare of arthritis.  Please take several days off work to rest and keep the joints elevated.  Avoid strenuous physical activity if possible for the next several days.  We did call in prednisone 20 mg for 3 days.  You should not take NSAIDs including aspirin, ibuprofen/Advil, naproxen/Aleve, celecoxib/Celebrex while on this medication as a cause stomach bleeding.  You can use Tylenol and your other medications for pain relief.  If your symptoms are not improving I do think it is worthwhile to see a sports medicine provider as we discussed.

## 2021-05-03 NOTE — ED Triage Notes (Signed)
Pt presents with bilateral knee pain and shoulder pain xs 2-3 weeks.

## 2021-05-03 NOTE — ED Provider Notes (Signed)
[facility]    CSN: [id number] Arrival date & time: 05/03/21  1301      History   Chief Complaint Chief Complaint  Patient presents with   Knee Pain    HPI [name] [name] is a 50 y.o. female.   Patient presents with a several month history of bilateral knee pain and left shoulder pain that has worsened over the last several weeks.  She has a history of arthritis and has undergone arthroscopy of both knees most recently June 2021.  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  Pain in her knees is rated 4 on a 0-10 pain scale, improved with rest, localized to medial joint bilaterally, described as aching, worsened with activity.  She denies any popping, clicking, instability.  She also reports left neck/shoulder pain.  Reports that she is right-handed but does require her left hand to perform job duties.  She reports this pain is rated 5 on a 0-10 pain scale, localized to left, described as aching.  She denies any recent changes in footwear or significant changes to her activity.  She is currently prescribed Celebrex, gabapentin, cyclobenzaprine and reports that despite regular use of these medicines her symptoms are not improving.  She has also tried over-the-counter NSAIDs without improvement of symptoms.  She does take [place] but reports that she is prediabetic.  Review of medical chart shows last A1c was well controlled at 7.0% March 2022.   Past Medical History:  Diagnosis Date   Acute respiratory failure with hypoxia ([place]) 06/18/2019   Anemia    Anxiety    Arthritis    "left knee"   Chronic lower back pain    "on the left side"   Headache(784.0) 02/29/12   "frequent"   Hyperkalemia 10/30/2018   Hypertension    Left knee pain 11/04/2015   Migraines    Pneumonia 2006    Post viral syndrome 07/17/2019   Renal insufficiency 06/18/2019   Sleep apnea    CPAP   Stress at home    "and at work"    Patient Active Problem List   Diagnosis Date Noted   Hand dermatitis 02/04/2021   Chronic pain of left knee 10/05/2020   Polydipsia 07/07/2020   Orthopnea 07/07/2020   Left knee injury, subsequent encounter 01/14/2020   Carpal tunnel syndrome 10/23/2019   Trigger finger, right 08/14/2019   Pneumonia due to COVID-19 virus 06/18/2019   Renal insufficiency 06/18/2019   Posterior knee pain, right 02/21/2018   Essential hypertension 01/30/2018   Mass on back 01/30/2018   Cervical radiculopathy 04/30/2017   Left wrist pain 11/28/2016   Type 2 diabetes mellitus treated without insulin ([place]) 02/16/2016   OSA (obstructive sleep apnea) 05/14/2015   Chronic lumbar radiculopathy 11/03/2014   Other allergic rhinitis 09/07/2014   Health care maintenance 09/07/2014   OBESITY, MORBID 09/20/2006   Anemia, iron deficiency 08/09/2006    Past Surgical History:  Procedure Laterality Date   BREAST BIOPSY Left 07/29/2013   CESAREAN SECTION  08/2008   CHOLECYSTECTOMY  03/02/2012   Procedure: LAPAROSCOPIC CHOLECYSTECTOMY;  Surgeon: Gwenyth Ober, MD;  Location: [facility];  Service: General;;   KNEE ARTHROSCOPY WITH MENISCAL REPAIR Right 06/25/2018   Procedure: RIGHT KNEE ARTHROSCOPY WITH MENISCAL ROOT REPAIR;  Surgeon: Meredith Pel, MD;  Location:  [facility];  Service: Orthopedics;  Laterality: Right;   KNEE ARTHROSCOPY WITH MENISCAL REPAIR Left 03/09/2020   Procedure: LEFT KNEE ARTHROSCOPY, MEDIAL MENISCAL ROOT REPAIR;  Surgeon: Meredith Pel, MD;  Location: [facility];  Service: Orthopedics;  Laterality: Left;   WISDOM TOOTH EXTRACTION      OB History   No obstetric history on file.      Home Medications    Prior to Admission medications   Medication Sig Start Date End Date Taking? Authorizing Provider  predniSONE (DELTASONE) 10 MG tablet Take 2 tablets (20 mg  total) by mouth daily for 3 days. 05/03/21 05/06/21 Yes Grazia Taffe, Derry Skill, PA-C  Accu-Chek Softclix Lancets lancets Use as instructed 04/20/21   Riesa Pope, MD  albuterol (PROAIR HFA) 108 (90 Base) MCG/ACT inhaler Inhale 1-2 puffs into the lungs every 6 (six) hours as needed for wheezing or shortness of breath. 06/29/20   [place], Carley D, DO  Blood Glucose Monitoring Suppl (ACCU-CHEK GUIDE) w/Device KIT Use to test blood sugar in the morning, Dx E11.9 01/11/21   Iona Beard, MD  celecoxib (CELEBREX) 100 MG capsule Take 1 capsule (100 mg total) by mouth 2 (two) times daily. 12/09/20   Alexandria Lodge, MD  cyclobenzaprine (FLEXERIL) 5 MG tablet TAKE 1 TABLET BY MOUTH ONCE DAILY FOR MUSCLE SPASM 04/28/21   Gaylan Gerold, DO  DULoxetine (CYMBALTA) 60 MG capsule Take 1 capsule by mouth once daily 02/27/21   Mitzi Hansen, MD  EQ ASPIRIN ADULT LOW DOSE 81 MG EC tablet Take 1 tablet by mouth once daily 06/21/20   Mitzi Hansen, MD  FARXIGA 5 MG TABS tablet Take 1 tablet by mouth once daily 02/27/21   Mitzi Hansen, MD  fluticasone (FLONASE) 50 MCG/ACT nasal spray Place 2 sprays into both nostrils daily. Patient taking differently: Place 1 spray into both nostrils daily as needed.  11/13/18   Joy, Shawn C, PA-C  gabapentin (NEURONTIN) 300 MG capsule Take 1-3 capsules (300-900 mg total) by mouth See admin instructions. Take 1 capsule every morning and take 3 capsules at bedtime Patient taking differently: Take 300-900 mg by mouth See admin instructions. Take 300 mg  capsule every morning and take 900 mg capsules at bedtime 08/14/19   Kathi Ludwig, MD  glucose blood (ACCU-CHEK GUIDE) test strip Use as instructed 01/11/21   Iona Beard, MD  lisinopril (ZESTRIL) 10 MG tablet Take 1 tablet (10 mg total) by mouth daily. 12/16/19 12/15/20  Kathi Ludwig, MD  traMADol (ULTRAM) 50 MG tablet Take 1 tablet (50 mg total) by mouth at bedtime as needed. 06/25/20 06/25/21  Magnant, Charles L, PA-C   triamcinolone cream (KENALOG) 0.5 % Apply 1 application topically 3 (three) times daily. 02/04/21   Madalyn Rob, MD    Family History Family History  Problem Relation Age of Onset   Cancer Other     Social History Social History   Tobacco Use   Smoking status: Never   Smokeless tobacco: Never  Vaping Use   Vaping Use: Never used  Substance Use Topics   Alcohol use: No    Alcohol/week: 0.0 standard drinks   Drug use: No     Allergies   Mushroom extract complex   Review of Systems Review of Systems  Constitutional:  Positive for activity change. Negative for appetite change, fatigue and fever.  Respiratory:  Negative for cough and shortness of breath.   Cardiovascular:  Negative for chest pain.  Gastrointestinal:  Negative for abdominal pain, diarrhea, nausea and vomiting.  Musculoskeletal:  Positive for arthralgias and myalgias.  Neurological:  Negative for dizziness, weakness, light-headedness, numbness and headaches.    Physical Exam Triage Vital Signs ED Triage Vitals  Enc Vitals Group     BP 05/03/21 1424 (!) 141/89     Pulse Rate 05/03/21 1424 94     Resp 05/03/21 1424 17     Temp 05/03/21 1424 98 F (36.7 C)     Temp Source 05/03/21 1424 Oral     SpO2 05/03/21 1424 98 %     Weight --      Height --      Head Circumference --      Peak Flow --      Pain Score 05/03/21 1422 3     Pain Loc --      Pain Edu? --      Excl. in [place]? --    No data found.  Updated Vital Signs BP (!) 141/89 (BP Location: Right Wrist)   Pulse 94   Temp 98 F (36.7 C) (Oral)   Resp 17   SpO2 98%   Visual Acuity Right Eye Distance:   Left Eye Distance:   Bilateral Distance:    Right Eye Near:   Left Eye Near:    Bilateral Near:     Physical Exam Vitals reviewed.  Constitutional:      General: She is awake. She is not in acute distress.    Appearance: Normal appearance. She is not ill-appearing.     Comments: Very pleasant female appears stated age no acute  distress sitting comfortably in exam room  HENT:     Head: Normocephalic and atraumatic.  Cardiovascular:     Rate and Rhythm: Normal rate and regular rhythm.     Heart sounds: Normal heart sounds, S1 normal and S2 normal. No murmur heard. Pulmonary:     Effort: Pulmonary effort is normal.     Breath sounds: Normal breath sounds. No wheezing, rhonchi or rales.     Comments: Clear to auscultation bilaterally Abdominal:     Palpations: Abdomen is soft.     Tenderness: There is no abdominal tenderness.  Musculoskeletal:     Left shoulder: Tenderness present. No swelling or bony tenderness. Normal range of motion. Normal strength.     Cervical back: Spasms and tenderness present. No bony tenderness.     Thoracic back: No tenderness or bony tenderness.     Lumbar back: No tenderness or bony tenderness.     Right knee: No swelling, effusion or bony tenderness. Tenderness present over the medial joint line. No LCL laxity, MCL laxity, ACL laxity or PCL laxity.     Left knee: No swelling, effusion or bony tenderness. Tenderness present over the medial joint line. No LCL laxity, MCL laxity, ACL laxity or PCL laxity.    Comments: Left neck/shoulder: Normal active range of motion.  Strength 5/5 bilateral upper extremities.  Hands neurovascularly intact.  No deformity noted.  Tenderness palpation over cervical and thoracic left paraspinal muscles.  Knees: No deformity or effusion noted.  No significant crepitus on exam.  No ligamentous laxity.  Mild tenderness palpation at medial joint line.Normal gait  Psychiatric:        Behavior: Behavior is cooperative.     UC Treatments / Results  Labs (all labs ordered are listed, but only abnormal results are displayed) Labs Reviewed - No data to display  EKG   Radiology No results found.  Procedures Procedures (including critical care time)  Medications Ordered in UC Medications - No data to display  Initial Impression / Assessment and Plan /  UC Course  I have reviewed the triage vital signs and the nursing notes.  Pertinent labs & imaging results that were available during my care of the patient were reviewed by me and considered in my medical decision making (see chart for details).     No x-rays obtained given lack of bony tenderness and no recent trauma.  Discussed that symptoms are likely related to an arthritis flare triggered by increased activity.  Discussed potential utility of increasing Celebrex temporarily versus adding short course of prednisone.  Patient reports her blood sugars are very well controlled so we will try short course of low-dose prednisone (20 mg for 3 days).  We discussed potential side effects of this medication including hyperglycemia.  She was instructed to avoid NSAIDs while on this medication including her prescribed Celebrex, ibuprofen, aspirin, naproxen.  She was also instructed to avoid carbohydrates and drink plenty of fluid.  Recommended that she rest to provide symptom relief and was given work excuse note for 3 days.  Discussed that if symptoms persist it would be worthwhile to see orthopedics versus sports medicine and she was given contact information for local group.  Discussed alarm symptoms that warrant emergent evaluation.  Strict return precautions given to which patient expressed understanding.   Final Clinical Impressions(s) / UC Diagnoses   Final diagnoses:  Bilateral chronic knee pain  Chronic left shoulder pain     Discharge Instructions      I suspect that your pain is related to a flare of arthritis.  Please take several days off work to rest and keep the joints elevated.  Avoid strenuous physical activity if possible for the next several days.  We did call in prednisone 20 mg for 3 days.  You should not take NSAIDs including aspirin, ibuprofen/Advil, naproxen/Aleve, celecoxib/Celebrex while on this medication as a cause stomach bleeding.  You can use Tylenol and your other  medications for pain relief.  If your symptoms are not improving I do think it is worthwhile to see a sports medicine provider as we discussed.     ED Prescriptions     Medication Sig Dispense Auth. Provider   predniSONE (DELTASONE) 10 MG tablet Take 2 tablets (20 mg total) by mouth daily for 3 days. 6 tablet Sherry Blackard, Derry Skill, PA-C      PDMP not reviewed this encounter.   Terrilee Croak, PA-C 05/03/21 1448

## 2021-05-26 DIAGNOSIS — Z419 Encounter for procedure for purposes other than remedying health state, unspecified: Secondary | ICD-10-CM | POA: Diagnosis not present

## 2021-06-01 ENCOUNTER — Ambulatory Visit (HOSPITAL_COMMUNITY)
Admission: EM | Admit: 2021-06-01 | Discharge: 2021-06-01 | Discharge status: Against Medical Advice | Payer: Medicaid Other

## 2021-06-01 ENCOUNTER — Emergency Department (HOSPITAL_COMMUNITY): Payer: Medicaid Other

## 2021-06-01 ENCOUNTER — Encounter (HOSPITAL_COMMUNITY): Payer: Self-pay

## 2021-06-01 ENCOUNTER — Other Ambulatory Visit: Payer: Self-pay

## 2021-06-01 ENCOUNTER — Emergency Department (HOSPITAL_COMMUNITY)
Admission: EM | Admit: 2021-06-01 | Discharge: 2021-06-01 | Disposition: A | Discharge status: Home / Self Care | Payer: Medicaid Other | Attending: Emergency Medicine | Admitting: Emergency Medicine

## 2021-06-01 DIAGNOSIS — M25511 Pain in right shoulder: Secondary | ICD-10-CM | POA: Diagnosis not present

## 2021-06-01 DIAGNOSIS — Z7952 Long term (current) use of systemic steroids: Secondary | ICD-10-CM | POA: Diagnosis not present

## 2021-06-01 DIAGNOSIS — S4991XA Unspecified injury of right shoulder and upper arm, initial encounter: Secondary | ICD-10-CM | POA: Diagnosis not present

## 2021-06-01 DIAGNOSIS — E119 Type 2 diabetes mellitus without complications: Secondary | ICD-10-CM | POA: Diagnosis not present

## 2021-06-01 DIAGNOSIS — Y92513 Shop (commercial) as the place of occurrence of the external cause: Secondary | ICD-10-CM | POA: Diagnosis not present

## 2021-06-01 DIAGNOSIS — M79641 Pain in right hand: Secondary | ICD-10-CM | POA: Diagnosis not present

## 2021-06-01 DIAGNOSIS — I10 Essential (primary) hypertension: Secondary | ICD-10-CM | POA: Insufficient documentation

## 2021-06-01 DIAGNOSIS — Z79899 Other long term (current) drug therapy: Secondary | ICD-10-CM | POA: Insufficient documentation

## 2021-06-01 DIAGNOSIS — W010XXA Fall on same level from slipping, tripping and stumbling without subsequent striking against object, initial encounter: Secondary | ICD-10-CM | POA: Diagnosis not present

## 2021-06-01 DIAGNOSIS — Z8616 Personal history of COVID-19: Secondary | ICD-10-CM | POA: Diagnosis not present

## 2021-06-01 DIAGNOSIS — M25531 Pain in right wrist: Secondary | ICD-10-CM | POA: Diagnosis not present

## 2021-06-01 DIAGNOSIS — M25521 Pain in right elbow: Secondary | ICD-10-CM | POA: Diagnosis not present

## 2021-06-01 DIAGNOSIS — W19XXXA Unspecified fall, initial encounter: Secondary | ICD-10-CM

## 2021-06-01 NOTE — ED Notes (Signed)
Ortho tech called for sling 

## 2021-06-01 NOTE — Discharge Instructions (Addendum)
Limited use of sling as discussed, recommend frequent range of motion exercises.  Can take Motrin and Tylenol as needed as directed for pain.  Apply ice as needed for 20 minutes at a time for the first 24 to 48 hours and then switch to warm compresses.  Follow-up with orthopedics if not improving in 1 week.

## 2021-06-01 NOTE — ED Triage Notes (Signed)
Pt reports right arm pain from hand to shoulder after tripping and falling earlier today. Pt denies any other injuries or hitting her head.

## 2021-06-01 NOTE — ED Provider Notes (Signed)
[facility] DEPT Provider Note   CSN: [id number] Arrival date & time: 06/01/21  1131     History Chief Complaint  Patient presents with   Arm Pain    [name] [name] is a 50 y.o. female.  HPI 50 year old female presents with right arm pain after a fall today.  Patient states that she was shopping when she tripped and fell landing on outstretched hands.  Patient denies loss of consciousness, did not hit her head.  Patient did not have any injuries immediately after the fall however did become sore which prompted her to come to the emergency room for evaluation.  No other injuries, complaints, concerns.     Past Medical History:  Diagnosis Date   Acute respiratory failure with hypoxia ([place]) 06/18/2019   Anemia    Anxiety    Arthritis    "left knee"   Chronic lower back pain    "on the left side"   Headache(784.0) 02/29/12   "frequent"   Hyperkalemia 10/30/2018   Hypertension    Left knee pain 11/04/2015   Migraines    Pneumonia 2006   Post viral syndrome 07/17/2019   Renal insufficiency 06/18/2019   Sleep apnea    CPAP   Stress at home    "and at work"    Patient Active Problem List   Diagnosis Date Noted   Hand dermatitis 02/04/2021   Chronic pain of left knee 10/05/2020   Polydipsia 07/07/2020   Orthopnea 07/07/2020   Left knee injury, subsequent encounter 01/14/2020   Carpal tunnel syndrome 10/23/2019   Trigger finger, right 08/14/2019   Pneumonia due to COVID-19 virus 06/18/2019   Renal insufficiency 06/18/2019   Posterior knee pain, right 02/21/2018   Essential hypertension 01/30/2018   Mass on back 01/30/2018   Cervical radiculopathy 04/30/2017   Left wrist pain 11/28/2016   Type 2 diabetes mellitus treated without insulin ([place]) 02/16/2016   OSA (obstructive sleep apnea) 05/14/2015   Chronic lumbar radiculopathy 11/03/2014   Other allergic rhinitis 09/07/2014   Health care maintenance 09/07/2014   OBESITY, MORBID 09/20/2006    Anemia, iron deficiency 08/09/2006    Past Surgical History:  Procedure Laterality Date   BREAST BIOPSY Left 07/29/2013   CESAREAN SECTION  08/2008   CHOLECYSTECTOMY  03/02/2012   Procedure: LAPAROSCOPIC CHOLECYSTECTOMY;  Surgeon: Gwenyth Ober, MD;  Location: [facility] OR;  Service: General;;   KNEE ARTHROSCOPY WITH MENISCAL REPAIR Right 06/25/2018   Procedure: RIGHT KNEE ARTHROSCOPY WITH MENISCAL ROOT REPAIR;  Surgeon: Meredith Pel, MD;  Location: [facility];  Service: Orthopedics;  Laterality: Right;   KNEE ARTHROSCOPY WITH MENISCAL REPAIR Left 03/09/2020   Procedure: LEFT KNEE ARTHROSCOPY, MEDIAL MENISCAL ROOT REPAIR;  Surgeon: Meredith Pel, MD;  Location: [facility];  Service: Orthopedics;  Laterality: Left;   WISDOM TOOTH EXTRACTION       OB History   No obstetric history on file.     Family History  Problem Relation Age of Onset   Cancer Other     Social History   Tobacco Use   Smoking status: Never   Smokeless tobacco: Never  Vaping Use   Vaping Use: Never used  Substance Use Topics   Alcohol use: No    Alcohol/week: 0.0 standard drinks   Drug use: No    Home Medications Prior to Admission medications   Medication Sig Start Date End Date Taking? Authorizing Provider  Accu-Chek Softclix Lancets lancets Use as instructed 04/20/21   Katsadouros, [employer],  MD  albuterol (PROAIR HFA) 108 (90 Base) MCG/ACT inhaler Inhale 1-2 puffs into the lungs every 6 (six) hours as needed for wheezing or shortness of breath. 06/29/20   [place], Carley D, DO  Blood Glucose Monitoring Suppl (ACCU-CHEK GUIDE) w/Device KIT Use to test blood sugar in the morning, Dx E11.9 01/11/21   Iona Beard, MD  celecoxib (CELEBREX) 100 MG capsule Take 1 capsule (100 mg total) by mouth 2 (two) times daily. 12/09/20   Alexandria Lodge, MD  cyclobenzaprine (FLEXERIL) 5 MG tablet TAKE 1 TABLET BY MOUTH ONCE DAILY FOR MUSCLE SPASM 04/28/21   Gaylan Gerold, DO  DULoxetine (CYMBALTA) 60 MG capsule Take 1 capsule by  mouth once daily 02/27/21   Mitzi Hansen, MD  EQ ASPIRIN ADULT LOW DOSE 81 MG EC tablet Take 1 tablet by mouth once daily 06/21/20   Mitzi Hansen, MD  FARXIGA 5 MG TABS tablet Take 1 tablet by mouth once daily 02/27/21   Mitzi Hansen, MD  fluticasone (FLONASE) 50 MCG/ACT nasal spray Place 2 sprays into both nostrils daily. Patient taking differently: Place 1 spray into both nostrils daily as needed.  11/13/18   Joy, Shawn C, PA-C  gabapentin (NEURONTIN) 300 MG capsule Take 1-3 capsules (300-900 mg total) by mouth See admin instructions. Take 1 capsule every morning and take 3 capsules at bedtime Patient taking differently: Take 300-900 mg by mouth See admin instructions. Take 300 mg  capsule every morning and take 900 mg capsules at bedtime 08/14/19   Kathi Ludwig, MD  glucose blood (ACCU-CHEK GUIDE) test strip Use as instructed 01/11/21   Iona Beard, MD  lisinopril (ZESTRIL) 10 MG tablet Take 1 tablet (10 mg total) by mouth daily. 12/16/19 12/15/20  Kathi Ludwig, MD  traMADol (ULTRAM) 50 MG tablet Take 1 tablet (50 mg total) by mouth at bedtime as needed. 06/25/20 06/25/21  Magnant, Charles L, PA-C  triamcinolone cream (KENALOG) 0.5 % Apply 1 application topically 3 (three) times daily. 02/04/21   Madalyn Rob, MD    Allergies    Mushroom extract complex  Review of Systems   Review of Systems  Constitutional:  Negative for fever.  Musculoskeletal:  Positive for arthralgias. Negative for back pain, gait problem, joint swelling, neck pain and neck stiffness.  Skin:  Negative for color change, rash and wound.  Allergic/Immunologic: Positive for immunocompromised state.  Neurological:  Negative for weakness and numbness.  Hematological:  Does not bruise/bleed easily.  Psychiatric/Behavioral:  Negative for confusion.   All other systems reviewed and are negative.  Physical Exam Updated Vital Signs BP (!) 158/88   Pulse 80   Temp 98 F (36.7 C)   Resp 18   SpO2 98%    Physical Exam Vitals and nursing note reviewed.  Constitutional:      General: She is not in acute distress.    Appearance: She is well-developed. She is not diaphoretic.  HENT:     Head: Normocephalic and atraumatic.  Cardiovascular:     Pulses: Normal pulses.  Pulmonary:     Effort: Pulmonary effort is normal.  Musculoskeletal:        General: Tenderness present. No swelling or deformity.     Cervical back: Normal, normal range of motion and neck supple. No tenderness or bony tenderness.     Comments: Generalized tenderness to right upper extremity without specific bony tenderness or significantly limited range of motion.  Sensation intact, brisk capillary refill present, radial pulse present.  Skin:    General: Skin is  warm and dry.     Findings: No erythema or rash.  Neurological:     Mental Status: She is alert and oriented to person, place, and time.     Sensory: No sensory deficit.     Motor: No weakness.  Psychiatric:        Behavior: Behavior normal.    ED Results / Procedures / Treatments   Labs (all labs ordered are listed, but only abnormal results are displayed) Labs Reviewed - No data to display  EKG None  Radiology DG Shoulder Right  Result Date: 06/01/2021 CLINICAL DATA:  Right shoulder pain after fall EXAM: RIGHT SHOULDER - 2+ VIEW COMPARISON:  None. FINDINGS: There is no evidence of fracture or dislocation. There is no evidence of significant arthropathy or other focal bone abnormality. Soft tissues are unremarkable. IMPRESSION: Negative. Electronically Signed   By: Davina Poke D.O.   On: 06/01/2021 13:37   DG Elbow Complete Right  Result Date: 06/01/2021 CLINICAL DATA:  Right elbow pain after fall EXAM: RIGHT ELBOW - COMPLETE 3+ VIEW COMPARISON:  None. FINDINGS: There is no evidence of fracture, dislocation, or joint effusion. There is no evidence of arthropathy or other focal bone abnormality. Soft tissues are unremarkable. IMPRESSION: Negative.  Electronically Signed   By: Davina Poke D.O.   On: 06/01/2021 13:36   DG Wrist Complete Right  Result Date: 06/01/2021 CLINICAL DATA:  Right hand and wrist pain after fall on outstretched hand EXAM: RIGHT HAND - COMPLETE 3+ VIEW; RIGHT WRIST - COMPLETE 3+ VIEW COMPARISON:  None. FINDINGS: No acute fracture of the right hand or wrist. No malalignment. Joint spaces are relatively preserved. Soft tissues within normal limits. IMPRESSION: No acute osseous abnormality of the right hand or wrist. Electronically Signed   By: Davina Poke D.O.   On: 06/01/2021 13:36   DG Hand Complete Right  Result Date: 06/01/2021 CLINICAL DATA:  Right hand and wrist pain after fall on outstretched hand EXAM: RIGHT HAND - COMPLETE 3+ VIEW; RIGHT WRIST - COMPLETE 3+ VIEW COMPARISON:  None. FINDINGS: No acute fracture of the right hand or wrist. No malalignment. Joint spaces are relatively preserved. Soft tissues within normal limits. IMPRESSION: No acute osseous abnormality of the right hand or wrist. Electronically Signed   By: Davina Poke D.O.   On: 06/01/2021 13:36    Procedures Procedures   Medications Ordered in ED Medications - No data to display  ED Course  I have reviewed the triage vital signs and the nursing notes.  Pertinent labs & imaging results that were available during my care of the patient were reviewed by me and considered in my medical decision making (see chart for details).  Clinical Course as of 06/01/21 1431  Wed Jun 02, 4563  412 50 year old female with right arm pain after a fall today as above.  Reports generalized right upper extremity pain onset after the fall, no specific pain at the time of the fall.  X-rays of the right upper extremity unremarkable.  Patient is placed in sling, discussed limited use for pain control, recommend frequent range of motion exercises to prevent further stiffness.  Follow-up with orthopedics if not improving. [LM]    Clinical Course User  Index [LM] Roque Lias   MDM Rules/Calculators/A&P                           Final Clinical Impression(s) / ED Diagnoses Final diagnoses:  Fall, initial  encounter  Injury of right upper extremity, initial encounter    Rx / DC Orders ED Discharge Orders     None        Tacy Learn, PA-C 06/01/21 1431    Daleen Bo, MD 06/01/21 1710

## 2021-06-02 ENCOUNTER — Telehealth: Payer: Self-pay

## 2021-06-02 NOTE — Telephone Encounter (Signed)
Transition Care Management Unsuccessful Follow-up Telephone Call  Date of discharge and from where:  06/01/2021-[name]  Attempts:  1st Attempt  Reason for unsuccessful TCM follow-up call:  Left voice message

## 2021-06-03 NOTE — Telephone Encounter (Signed)
Transition Care Management Unsuccessful Follow-up Telephone Call  Date of discharge and from where:  06/01/2021-[facility]  Attempts:  2nd Attempt  Reason for unsuccessful TCM follow-up call:  Left voice message

## 2021-06-06 NOTE — Telephone Encounter (Signed)
Transition Care Management Unsuccessful Follow-up Telephone Call  Date of discharge and from where:  06/01/2021 from [facility]  Attempts:  3rd Attempt  Reason for unsuccessful TCM follow-up call:  Unable to reach patient

## 2021-06-16 DIAGNOSIS — G4733 Obstructive sleep apnea (adult) (pediatric): Secondary | ICD-10-CM | POA: Diagnosis not present

## 2021-06-25 DIAGNOSIS — Z419 Encounter for procedure for purposes other than remedying health state, unspecified: Secondary | ICD-10-CM | POA: Diagnosis not present

## 2021-06-29 ENCOUNTER — Encounter: Payer: Self-pay | Admitting: Internal Medicine

## 2021-07-26 DIAGNOSIS — Z419 Encounter for procedure for purposes other than remedying health state, unspecified: Secondary | ICD-10-CM | POA: Diagnosis not present

## 2021-08-03 ENCOUNTER — Telehealth: Payer: Self-pay | Admitting: *Deleted

## 2021-08-03 ENCOUNTER — Ambulatory Visit (INDEPENDENT_AMBULATORY_CARE_PROVIDER_SITE_OTHER): Payer: Medicaid Other | Admitting: Internal Medicine

## 2021-08-03 ENCOUNTER — Encounter: Payer: Self-pay | Admitting: Internal Medicine

## 2021-08-03 VITALS — BP 140/95 | HR 88 | Temp 98.3°F | Wt 273.5 lb

## 2021-08-03 DIAGNOSIS — I10 Essential (primary) hypertension: Secondary | ICD-10-CM

## 2021-08-03 DIAGNOSIS — Z1231 Encounter for screening mammogram for malignant neoplasm of breast: Secondary | ICD-10-CM

## 2021-08-03 DIAGNOSIS — M5416 Radiculopathy, lumbar region: Secondary | ICD-10-CM

## 2021-08-03 DIAGNOSIS — E119 Type 2 diabetes mellitus without complications: Secondary | ICD-10-CM | POA: Diagnosis not present

## 2021-08-03 DIAGNOSIS — Z23 Encounter for immunization: Secondary | ICD-10-CM

## 2021-08-03 DIAGNOSIS — Z13220 Encounter for screening for lipoid disorders: Secondary | ICD-10-CM

## 2021-08-03 DIAGNOSIS — E785 Hyperlipidemia, unspecified: Secondary | ICD-10-CM | POA: Diagnosis not present

## 2021-08-03 DIAGNOSIS — G4733 Obstructive sleep apnea (adult) (pediatric): Secondary | ICD-10-CM | POA: Diagnosis not present

## 2021-08-03 DIAGNOSIS — Z1211 Encounter for screening for malignant neoplasm of colon: Secondary | ICD-10-CM

## 2021-08-03 DIAGNOSIS — Z0000 Encounter for general adult medical examination without abnormal findings: Secondary | ICD-10-CM

## 2021-08-03 DIAGNOSIS — Z0100 Encounter for examination of eyes and vision without abnormal findings: Secondary | ICD-10-CM

## 2021-08-03 LAB — POCT GLYCOSYLATED HEMOGLOBIN (HGB A1C): Hemoglobin A1C: 6.7 % — AB (ref 4.0–5.6)

## 2021-08-03 LAB — GLUCOSE, CAPILLARY: Glucose-Capillary: 120 mg/dL — ABNORMAL HIGH (ref 70–99)

## 2021-08-03 MED ORDER — CYCLOBENZAPRINE HCL 5 MG PO TABS: ORAL_TABLET | ORAL | 0 refills | Status: DC

## 2021-08-03 MED ORDER — DAPAGLIFLOZIN PROPANEDIOL 5 MG PO TABS: 5.0000 mg | ORAL_TABLET | Freq: Every day | ORAL | 0 refills | Status: DC

## 2021-08-03 MED ORDER — CYCLOBENZAPRINE HCL 5 MG PO TABS: ORAL_TABLET | ORAL | 2 refills | Status: DC

## 2021-08-03 MED ORDER — LISINOPRIL 10 MG PO TABS: 20.0000 mg | ORAL_TABLET | Freq: Every day | ORAL | 3 refills | Status: DC

## 2021-08-03 MED ORDER — DAPAGLIFLOZIN PROPANEDIOL 5 MG PO TABS: 5.0000 mg | ORAL_TABLET | Freq: Every day | ORAL | 3 refills | Status: DC

## 2021-08-03 NOTE — Patient Instructions (Addendum)
Thank you, [name] for allowing [place] to provide your care today. 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  Cholesterol- Will contact you about these results if abnormal High blood pressure- your blood pressure is still not at goal; we increased your lisinopril to 20 mg daily. Please continue taking EVERYDAY and keep a log of your BP and bring back during next visit.   Your medications have been refilled  Referrals have been placed for colonoscopy, eye exam, and mammogram.  You got the flu shot and pneumonia vaccine today.   I have ordered the following labs for you:  Lab Orders         Glucose, capillary         Lipid Profile         Microalbumin / Creatinine Urine Ratio         POC Hbg A1C       Referrals ordered today:   Referral Orders         Ambulatory referral to Gastroenterology         Ambulatory referral to Ophthalmology    [facility] at [facility]     Refills:   Meds ordered this encounter  Medications   lisinopril (ZESTRIL) 10 MG tablet    Sig: Take 2 tablets (20 mg total) by mouth daily.    Dispense:  90 tablet    Refill:  3   dapagliflozin propanediol (FARXIGA) 5 MG TABS tablet    Sig: Take 1 tablet (5 mg total) by mouth daily.    Dispense:  90 tablet    Refill:  0   cyclobenzaprine (FLEXERIL) 5 MG tablet    Sig: TAKE 1 TABLET BY MOUTH ONCE DAILY FOR MUSCLE SPASM    Dispense:  30 tablet    Refill:  0   . Lisinopril went up in dose from 10 to 20 mg daily.   Follow up: 3 months    Should you have any questions or concerns please call the internal medicine clinic at [phone].     Carmel Sacramento, MD  [facility]

## 2021-08-03 NOTE — Progress Notes (Signed)
CC: CDL paperwork, blood work, medication refill, address care gaps  HPI:  [name] is a 50 y.o. female with a PMHx stated below and presents today for stated above.  Please see problem based assessment and plan for additional details.  Past Medical History:  Diagnosis Date   Acute respiratory failure with hypoxia ([place]) 06/18/2019   Anemia    Anxiety    Arthritis    "left knee"   Chronic lower back pain    "on the left side"   Headache(784.0) 02/29/12   "frequent"   Hyperkalemia 10/30/2018   Hypertension    Left knee pain 11/04/2015   Migraines    Pneumonia 2006   Post viral syndrome 07/17/2019   Renal insufficiency 06/18/2019   Sleep apnea    CPAP   Stress at home    "and at work"    Current Outpatient Medications on File Prior to Visit  Medication Sig Dispense Refill   Accu-Chek Softclix Lancets lancets Use as instructed 100 each 12   albuterol (PROAIR HFA) 108 (90 Base) MCG/ACT inhaler Inhale 1-2 puffs into the lungs every 6 (six) hours as needed for wheezing or shortness of breath. 8 g 2   Blood Glucose Monitoring Suppl (ACCU-CHEK GUIDE) w/Device KIT Use to test blood sugar in the morning, Dx E11.9 1 kit 0   celecoxib (CELEBREX) 100 MG capsule Take 1 capsule (100 mg total) by mouth 2 (two) times daily. 28 capsule 0   cyclobenzaprine (FLEXERIL) 5 MG tablet TAKE 1 TABLET BY MOUTH ONCE DAILY FOR MUSCLE SPASM 30 tablet 0   DULoxetine (CYMBALTA) 60 MG capsule Take 1 capsule by mouth once daily 90 capsule 0   EQ ASPIRIN ADULT LOW DOSE 81 MG EC tablet Take 1 tablet by mouth once daily 28 tablet 0   FARXIGA 5 MG TABS tablet Take 1 tablet by mouth once daily 30 tablet 0   fluticasone (FLONASE) 50 MCG/ACT nasal spray Place 2 sprays into both nostrils daily. (Patient taking differently: Place 1 spray into both nostrils daily as needed. ) 16 g 0   gabapentin (NEURONTIN) 300 MG capsule Take 1-3 capsules (300-900 mg total) by mouth See admin instructions. Take 1 capsule every  morning and take 3 capsules at bedtime (Patient taking differently: Take 300-900 mg by mouth See admin instructions. Take 300 mg  capsule every morning and take 900 mg capsules at bedtime) 360 capsule 1   glucose blood (ACCU-CHEK GUIDE) test strip Use as instructed 100 each 12   lisinopril (ZESTRIL) 10 MG tablet Take 1 tablet (10 mg total) by mouth daily. 90 tablet 3   triamcinolone cream (KENALOG) 0.5 % Apply 1 application topically 3 (three) times daily. 30 g 0   No current facility-administered medications on file prior to visit.    Family History  Problem Relation Age of Onset   Cancer Other     Social History   Socioeconomic History   Marital status: Single    Spouse name: Not on file   Number of children: Not on file   Years of education: Not on file   Highest education level: Not on file  Occupational History   Not on file  Tobacco Use   Smoking status: Never   Smokeless tobacco: Never  Vaping Use   Vaping Use: Never used  Substance and Sexual Activity   Alcohol use: No    Alcohol/week: 0.0 standard drinks   Drug use: No   Sexual activity: Not on file  Other Topics Concern   Not on file  Social History Narrative   Not on file   Social Determinants of Health   Financial Resource Strain: Not on file  Food Insecurity: Not on file  Transportation Needs: Not on file  Physical Activity: Not on file  Stress: Not on file  Social Connections: Not on file  Intimate Partner Violence: Not on file    Review of Systems: ROS negative except for what is noted on the assessment and plan.  Vitals:   08/03/21 0858  BP: (!) 140/95  Pulse: 88  Temp: 98.3 F (36.8 C)  TempSrc: Oral  SpO2: 100%  Weight: 273 lb 8 oz (124.1 kg)     Physical Exam: Constitutional: alert, well-appearing, in NAD HENT: normocephalic, atraumatic, mucous membranes moist Eyes: conjunctiva non-erythematous, EOMI Cardiovascular: RRR, no m/r/g, non-edematous bilateral LE, intact pulses  bilaterally  Pulmonary/Chest: normal work of breathing on room air, LCTAB Abdominal: soft, non-tender to palpation, non-distended MSK: normal bulk and tone Neurological: A&O x 3, 5/5 strength in bilateral upper and lower extremities Psych: normal behavior, normal affect   Assessment & Plan:   See Encounters Tab for problem based charting.  Patient seen with Dr. Constance Goltz, MD  Internal Medicine Resident, PGY-1 [facility]  9:01 AM, 08/03/2021

## 2021-08-03 NOTE — Assessment & Plan Note (Addendum)
Vitals:   08/03/21 0858  BP: (!) 140/95  Pulse: 88  Temp: 98.3 F (36.8 C)  SpO2: 100%    BP not at goal, 140/95 today. The patient denies chest pain, visual changes, weakness, dizziness on standing. Does endorse morning headaches related to using her CPAP devise yesterday after a long break from it for about 1.5 years. Stopped using it because it gave her headaches in the AM. Counseled on the importance of using it nightly to further control BP. Excellent BP medication adherence. Did not bring a BP log today but said SBP "runs 130-140's".  Plan: -Increase lisinopril 10 to 20 mg daily -Lifestyle modifications encouraged -CPAP nightly -BP log and bring during next visit

## 2021-08-03 NOTE — Assessment & Plan Note (Addendum)
-  Foot exam completed  -Lipid panel ordered; none on file, will start statin therapy depending on results.  -Urine albumin ordered opthalmology referral placed for annual eye exam -Colonoscopy referral placed -Mammogram referral placed -Flu shot administered pneumococcal vaccine administered  -Pt reports she will get pap smear during next visit  Addendum: Lipid panel with LDL not at goal, 104 in 50 yo pt with hx of DM2, HTN, and obesity. Will start statin therapy. See HLD A&P.  Microalbumin/Cr ratio 4 wnl Urine microalbumin 8

## 2021-08-03 NOTE — Telephone Encounter (Signed)
Received call from Pharmacist. Dr. Allena Katz is not yet certified with [facility]. Requesting meds sent today be resent by Attending.

## 2021-08-03 NOTE — Assessment & Plan Note (Signed)
Reports she has not used her CPAP consistently since she had COVID ~1.5 years ago because she believes CPAP made her COVID symptoms worse and then she got out of the habit of using it. Had a discussion with the patient regarding the importance of starting back on CPAP to better manage her HTN and weight.   Plan: - Counseled patient to consistently use her CPAP

## 2021-08-03 NOTE — Telephone Encounter (Signed)
Done. Thank you.

## 2021-08-03 NOTE — Assessment & Plan Note (Addendum)
Pt reports that her back pain is not particularly bothersome lately with her current pain management regimen. She has no concerns at this time related to this.   Plan: -Continue Gabapentin prn  -Continue Flexeril prn -Continue NSAIDs 800 mg daily prn -Continued to discuss weight loss goals

## 2021-08-03 NOTE — Assessment & Plan Note (Addendum)
A1c 6.7, 7% during last visit nearly 1 year ago. Excellent compliance with Marcelline Deist. Reports home fasting glucose in 120's. Denies polyuria and polydipsia. Encouraged pt to come to clinic every 3 months rather than yearly to better monitor DM.   Plan: Farxiga 5 mg  Repeat A1c at next visit if after 3 months from last A1c Lifestyle modifications including weight loss and daily walking   Addendum: Microalbumin/Cr ratio 4 wnl Urine microalbumin 8

## 2021-08-04 ENCOUNTER — Telehealth: Payer: Self-pay | Admitting: Internal Medicine

## 2021-08-04 DIAGNOSIS — E1169 Type 2 diabetes mellitus with other specified complication: Secondary | ICD-10-CM | POA: Insufficient documentation

## 2021-08-04 DIAGNOSIS — E785 Hyperlipidemia, unspecified: Secondary | ICD-10-CM | POA: Insufficient documentation

## 2021-08-04 LAB — LIPID PANEL
Chol/HDL Ratio: 2.9 ratio (ref 0.0–4.4)
Cholesterol, Total: 176 mg/dL (ref 100–199)
HDL: 61 mg/dL (ref >39)
LDL Chol Calc (NIH): 104 mg/dL — ABNORMAL HIGH (ref 0–99)
Triglycerides: 57 mg/dL (ref 0–149)
VLDL Cholesterol Cal: 11 mg/dL (ref 5–40)

## 2021-08-04 LAB — MICROALBUMIN / CREATININE URINE RATIO
Creatinine, Urine: 223.5 mg/dL
Microalb/Creat Ratio: 4 mg/g creat (ref 0–29)
Microalbumin, Urine: 8 ug/mL

## 2021-08-04 MED ORDER — ROSUVASTATIN CALCIUM 20 MG PO TABS: 20.0000 mg | ORAL_TABLET | Freq: Every day | ORAL | 2 refills | Status: DC

## 2021-08-04 NOTE — Addendum Note (Signed)
Addended byCarmel Sacramento on: 08/04/2021 09:08 AM   Modules accepted: Orders

## 2021-08-04 NOTE — Progress Notes (Signed)
Internal Medicine Clinic Attending  I saw and evaluated the patient.  I personally confirmed the key portions of the history and exam documented by Dr. Patel and I reviewed pertinent patient test results.  The assessment, diagnosis, and plan were formulated together and I agree with the documentation in the resident's note.  

## 2021-08-04 NOTE — Telephone Encounter (Signed)
Please call -   [facility] [address] [place], [place] - [address] (Ph: [phone])  rosuvastatin (CRESTOR) 20 MG tablet

## 2021-08-04 NOTE — Assessment & Plan Note (Addendum)
Lipid Panel     Component Value Date/Time   CHOL 176 08/03/2021 1031   TRIG 57 08/03/2021 1031   HDL 61 08/03/2021 1031   CHOLHDL 2.9 08/03/2021 1031   LDLCALC 104 (H) 08/03/2021 1031   LABVLDL 11 08/03/2021 1031   49 yo pt with LDL not at goal. ASCVD 10 year risk 1.7%. Given hx of DM2, HTN, and obesity, prescribed Crestor 20 mg qd. Pt called and informed about results, and insturcted to take medication as prescribed.

## 2021-08-15 ENCOUNTER — Ambulatory Visit: Payer: Medicaid Other

## 2021-08-15 ENCOUNTER — Encounter: Payer: Self-pay | Admitting: Internal Medicine

## 2021-08-15 ENCOUNTER — Ambulatory Visit (INDEPENDENT_AMBULATORY_CARE_PROVIDER_SITE_OTHER): Payer: Medicaid Other | Admitting: Internal Medicine

## 2021-08-15 VITALS — BP 134/94 | HR 83 | Temp 98.3°F | Resp 24 | Ht 62.0 in | Wt 271.7 lb

## 2021-08-15 DIAGNOSIS — E119 Type 2 diabetes mellitus without complications: Secondary | ICD-10-CM | POA: Diagnosis not present

## 2021-08-15 DIAGNOSIS — M5416 Radiculopathy, lumbar region: Secondary | ICD-10-CM | POA: Diagnosis not present

## 2021-08-15 DIAGNOSIS — I10 Essential (primary) hypertension: Secondary | ICD-10-CM | POA: Diagnosis not present

## 2021-08-15 DIAGNOSIS — E349 Endocrine disorder, unspecified: Secondary | ICD-10-CM

## 2021-08-15 DIAGNOSIS — E785 Hyperlipidemia, unspecified: Secondary | ICD-10-CM

## 2021-08-15 DIAGNOSIS — G4733 Obstructive sleep apnea (adult) (pediatric): Secondary | ICD-10-CM | POA: Diagnosis not present

## 2021-08-15 NOTE — Assessment & Plan Note (Addendum)
Today's Vitals   08/15/21 0902  BP: (!) 113/94  Pulse: 90  Resp: (!) 24  Temp: 98.3 F (36.8 C)  TempSrc: Oral  SpO2: 99%  Weight: 271 lb 11.2 oz (123.2 kg)  Height: 5\' 2"  (1.575 m)   BP at goal during visit today. Reports good adhere to current regimen.Tolerating medication without adverse effects. Denies headaches, vision changes, lightheadedness, chest pain, SHOB, leg swelling or changes in speech. Exam benign and vitals otherwise wnl.  Pt continues to try to make lifestyle modifications; counseled on the importance of daily exercise, low salt diet, and weight loss.  Has hx os OSA, had not been using CPAP during previous visit 2 weeks ago; states during this visit that she has started to use.   Continue Lisinopril 20 Encouraged to continue lifestyle modifications Use CPAP for appropriate management of HTN BP log and bring to next visit  F/u on BMP

## 2021-08-15 NOTE — Assessment & Plan Note (Signed)
Reports that she has started using her CPAP devise; BP closer to goal this visit. Encouraged to continue to use.

## 2021-08-15 NOTE — Assessment & Plan Note (Addendum)
At baseline, without any acute changes or particular concerns during this visit. Pt actually reports that her pain is barely noticeable, now that she is exercising. She report that she has not used gabapentin in several months. Reports rarely using Flexeril. Exam benign and vitals stable.   -Discontinued Gabapentin  -Discontinued Flexeril prn -Continue NSAIDs 800 mg daily as needed -Continued to discuss weight loss goals -Discussed use of thermotherapy for further benefit.  -Paperwork completed for CDL

## 2021-08-15 NOTE — Progress Notes (Signed)
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    HPI:  [name] [name] is a 50 y.o. female with a PMHx stated below and presents today for stated above. Please see the Encounters tab for problem-based Assessment & Plan for additional details.   Past Medical History:  Diagnosis Date   Acute respiratory failure with hypoxia ([place]) 06/18/2019   Anemia    Anxiety    Arthritis    "left knee"   Chronic lower back pain    "on the left side"   Headache(784.0) 02/29/12   "frequent"   Hyperkalemia 10/30/2018   Hypertension    Left knee pain 11/04/2015   Migraines    Pneumonia 2006   Post viral syndrome 07/17/2019   Renal insufficiency 06/18/2019   Sleep apnea    CPAP   Stress at home    "and at work"    Current Outpatient Medications on File Prior to Visit  Medication Sig Dispense Refill   Accu-Chek Softclix Lancets lancets Use as instructed 100 each 12   albuterol (PROAIR HFA) 108 (90 Base) MCG/ACT inhaler Inhale 1-2 puffs into the lungs every 6 (six) hours as needed for wheezing or shortness of breath. 8 g 2   Blood Glucose Monitoring Suppl (ACCU-CHEK GUIDE) w/Device KIT Use to test blood sugar in the morning, Dx E11.9 1 kit 0   celecoxib (CELEBREX) 100 MG capsule Take 1 capsule (100 mg total) by mouth 2 (two) times daily. 28 capsule 0   cyclobenzaprine (FLEXERIL) 5 MG tablet TAKE 1 TABLET BY MOUTH ONCE DAILY FOR MUSCLE SPASM 30 tablet 2   dapagliflozin propanediol (FARXIGA) 5 MG TABS tablet Take 1 tablet (5 mg total) by mouth daily. 90 tablet 3   EQ ASPIRIN ADULT LOW DOSE 81 MG EC tablet Take 1 tablet by mouth once daily 28 tablet 0   fluticasone (FLONASE) 50 MCG/ACT nasal spray Place 2 sprays into both nostrils daily. (Patient taking differently: Place 1 spray into both nostrils daily as needed. ) 16 g 0   gabapentin (NEURONTIN) 300 MG capsule Take 1-3 capsules (300-900 mg total) by mouth See admin instructions. Take 1 capsule every morning and take 3 capsules  at bedtime (Patient taking differently: Take 300-900 mg by mouth See admin instructions. Take 300 mg  capsule every morning and take 900 mg capsules at bedtime) 360 capsule 1   glucose blood (ACCU-CHEK GUIDE) test strip Use as instructed 100 each 12   lisinopril (ZESTRIL) 10 MG tablet Take 2 tablets (20 mg total) by mouth daily. 90 tablet 3   rosuvastatin (CRESTOR) 20 MG tablet Take 1 tablet (20 mg total) by mouth daily. 90 tablet 2   triamcinolone cream (KENALOG) 0.5 % Apply 1 application topically 3 (three) times daily. 30 g 0   No current facility-administered medications on file prior to visit.    Family History  Problem Relation Age of Onset   Cancer Other     Social History   Socioeconomic History   Marital status: Single    Spouse name: Not on file   Number of children: Not on file   Years of education: Not on file   Highest education level: Not on file  Occupational History   Not on file  Tobacco Use   Smoking status: Never   Smokeless tobacco: Never  Vaping Use   Vaping Use: Never used  Substance and Sexual Activity   Alcohol use: No    Alcohol/week: 0.0 standard drinks  Drug use: No   Sexual activity: Not on file  Other Topics Concern   Not on file  Social History Narrative   Not on file   Social Determinants of Health   Financial Resource Strain: Not on file  Food Insecurity: Not on file  Transportation Needs: Not on file  Physical Activity: Not on file  Stress: Not on file  Social Connections: Not on file  Intimate Partner Violence: Not on file    Review of Systems: ROS negative except for what is noted on the assessment and plan.  Vitals:   08/15/21 0902 08/15/21 0912  BP: (!) 113/94 (!) 134/94  Pulse: 90 83  Resp: (!) 24   Temp: 98.3 F (36.8 C)   TempSrc: Oral   SpO2: 99%   Weight: 271 lb 11.2 oz (123.2 kg)   Height: 5' 2"  (1.575 m)      Physical Exam: Constitutional: alert, well-appearing, in NAD HENT: normocephalic, atraumatic,  mucous membranes moist Eyes: conjunctiva non-erythematous, EOMI Cardiovascular: RRR, no m/r/g, non-edematous bilateral LE Pulmonary/Chest: normal work of breathing on RA, LCTAB Abdominal: soft, non-tender to palpation, non-distended MSK: normal bulk and tone  Neurological: A&O x 3, 5/5 strength in bilateral upper and lower extremities  Skin: warm and dry  Psych: normal behavior, normal affect    Assessment & Plan:   See Encounters Tab for problem based charting.  Patient discussed with Dr. Beckie Busing, MD  Internal Medicine Resident, PGY-1 [facility]

## 2021-08-15 NOTE — Patient Instructions (Signed)
Thank you, [name] [name] for allowing [place] to provide your care today!  Today we discussed:  Paperwork for work Chronic pain: since you are doing better, please stop taking gabapentin and flexeril  No changes to your other medications; continue taking the ones you have been prior to this visit.   I will call you if any labs, tests, or imaging results require any further attention or action.   I have ordered the following labs for you:  Lab Orders  No laboratory test(s) ordered today     Tests ordered today:   none  Referrals ordered today:   Referral Orders  No referral(s) requested today     Medications ordered or changed:  Stop the following medications: Medications Discontinued During This Encounter  Medication Reason   celecoxib (CELEBREX) 100 MG capsule Patient has not taken in last 30 days   cyclobenzaprine (FLEXERIL) 5 MG tablet Change in therapy   EQ ASPIRIN ADULT LOW DOSE 81 MG EC tablet Change in therapy   gabapentin (NEURONTIN) 300 MG capsule Patient has not taken in last 30 days   triamcinolone cream (KENALOG) 0.5 % Patient has not taken in last 30 days     Start the following medications: No orders of the defined types were placed in this encounter.    Follow up in: 2.5 months   Should you have any questions or concerns please call the internal medicine clinic at [phone].     Carmel Sacramento, MD  Internal Medicine Resident, PGY-1 [facility]

## 2021-08-15 NOTE — Assessment & Plan Note (Signed)
Prescribed Crestor 20 during previous visit 2 weeks ago. Reports good compliance without adverse effects. Continue current regimen without any changes. CTM for adverse effects.

## 2021-08-15 NOTE — Assessment & Plan Note (Addendum)
A1c 6.7 on 11/11. Reports excellent compliance with [place]. No significant proteinuria on urine microalbumin from previous visit. Tolerating medication without adverse effects. Continues to make lifestyle modifications; counseled on the benefits of daily exercise, limiting processed foods and high sugar foods, and weight loss. Denies polydipsia, polyuria, weight loss, lethargy, blurry vision, and changes in sensation. Benign physical exam and vitals stable.    Plan: Continue Farxiga 5 mg  Repeat A1c at next visit if after 2.5 mo Lifestyle modifications including weight loss and daily walking

## 2021-08-16 ENCOUNTER — Other Ambulatory Visit: Payer: Self-pay

## 2021-08-16 ENCOUNTER — Ambulatory Visit
Admission: RE | Admit: 2021-08-16 | Discharge: 2021-08-16 | Disposition: A | Discharge status: Home / Self Care | Payer: Medicaid Other | Source: Ambulatory Visit | Attending: Student in an Organized Health Care Education/Training Program | Admitting: Student in an Organized Health Care Education/Training Program

## 2021-08-16 DIAGNOSIS — Z1231 Encounter for screening mammogram for malignant neoplasm of breast: Secondary | ICD-10-CM

## 2021-08-23 NOTE — Progress Notes (Signed)
Internal Medicine Clinic Attending  Case discussed with Dr. Patel  At the time of the visit.  We reviewed the resident's history and exam and pertinent patient test results.  I agree with the assessment, diagnosis, and plan of care documented in the resident's note.  

## 2021-08-25 DIAGNOSIS — Z419 Encounter for procedure for purposes other than remedying health state, unspecified: Secondary | ICD-10-CM | POA: Diagnosis not present

## 2021-09-01 ENCOUNTER — Encounter: Payer: Self-pay | Admitting: Internal Medicine

## 2021-09-08 ENCOUNTER — Encounter: Payer: Medicaid Other | Admitting: Internal Medicine

## 2021-09-25 DIAGNOSIS — Z419 Encounter for procedure for purposes other than remedying health state, unspecified: Secondary | ICD-10-CM | POA: Diagnosis not present

## 2021-10-26 DIAGNOSIS — Z419 Encounter for procedure for purposes other than remedying health state, unspecified: Secondary | ICD-10-CM | POA: Diagnosis not present

## 2021-11-20 IMAGING — CR DG HAND COMPLETE 3+V*R*
3 series · 3 of 3 positions shown · non-contrast
Comparison: None.

CLINICAL DATA: Right hand and wrist pain after fall on outstretched
hand

EXAM:
RIGHT HAND - COMPLETE 3+ VIEW; RIGHT WRIST - COMPLETE 3+ VIEW

[x hand pa right]
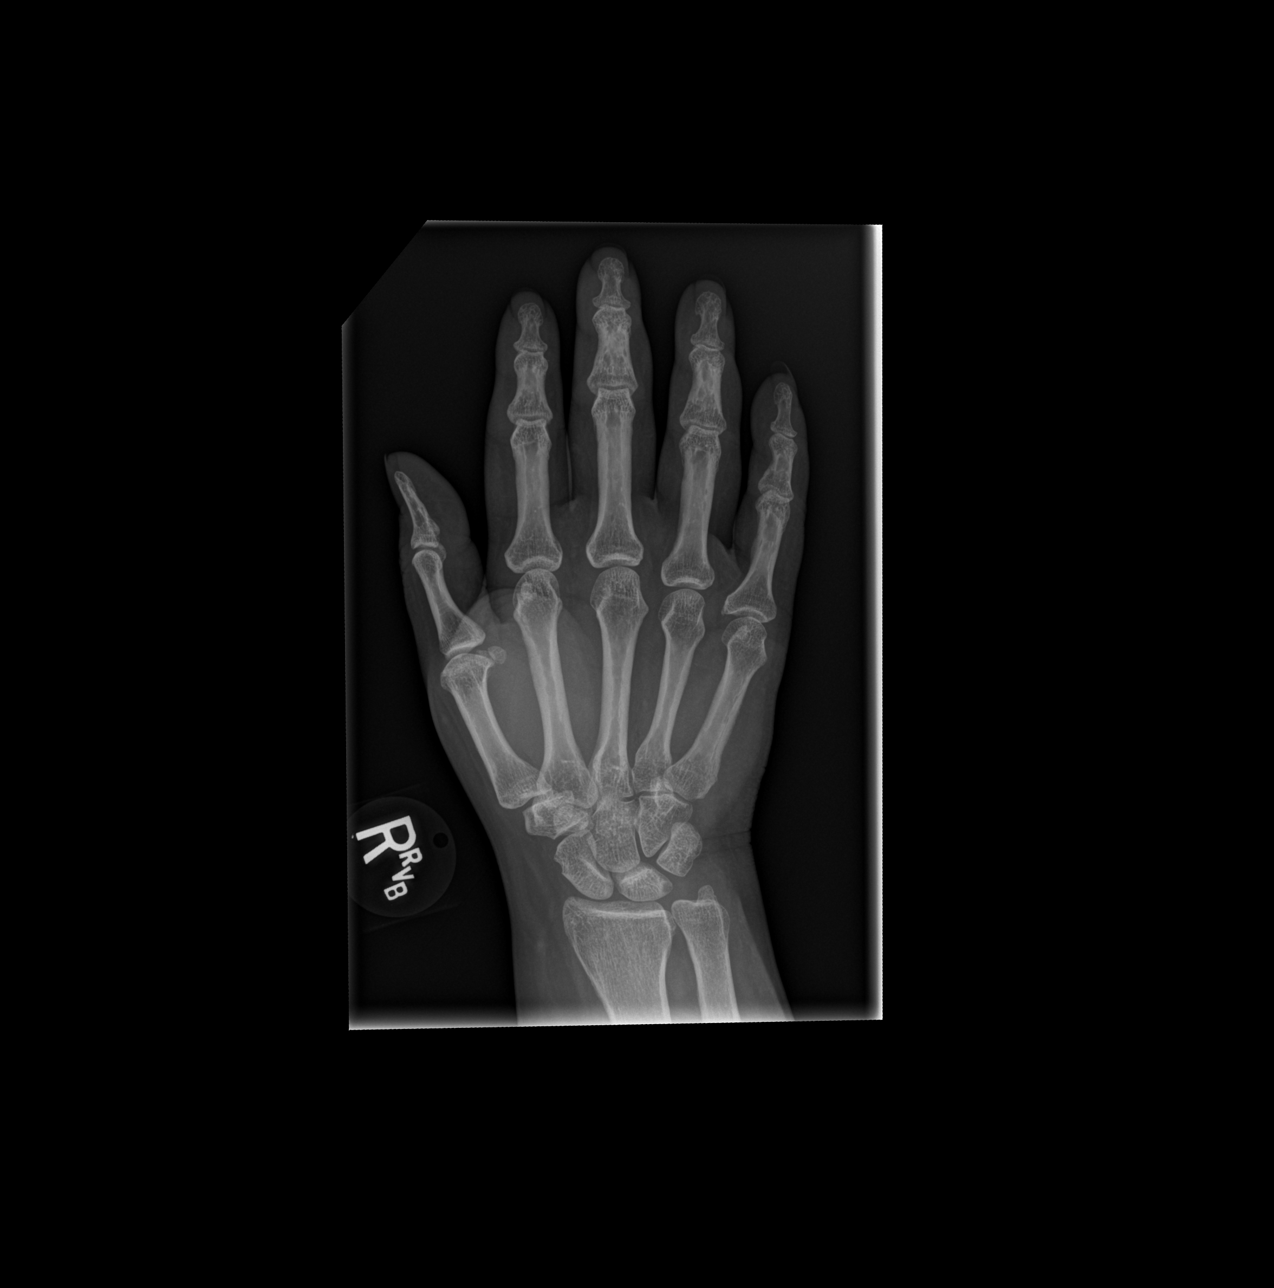

[x hand obl right]
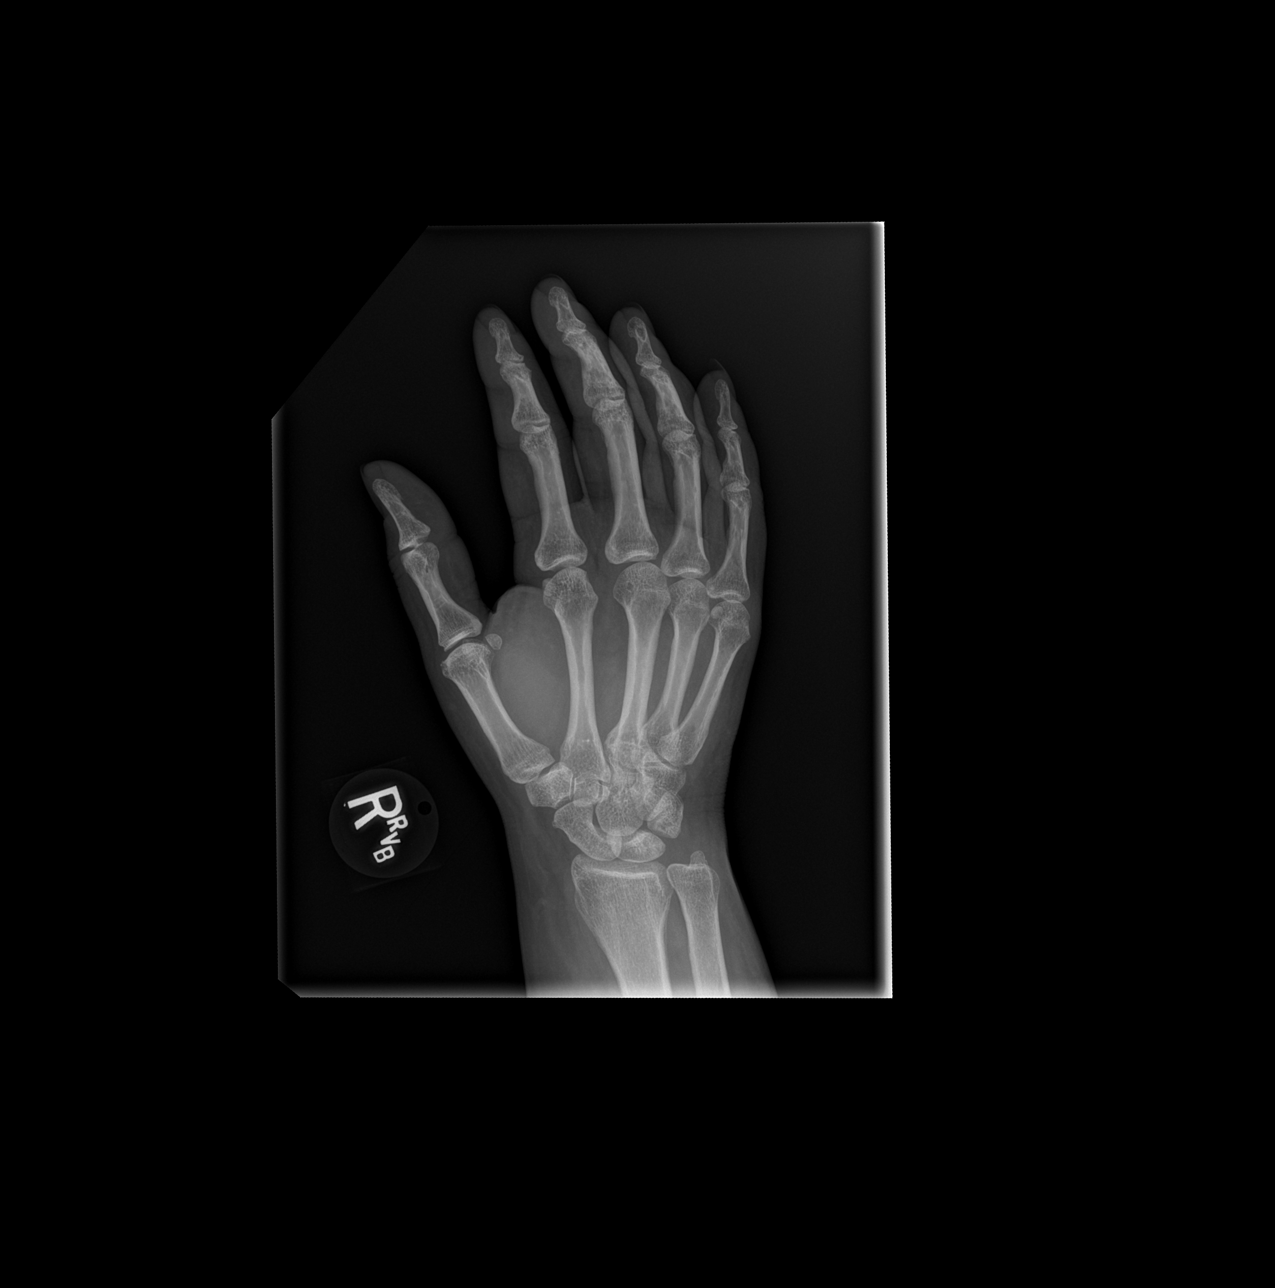

[x hand lat right]
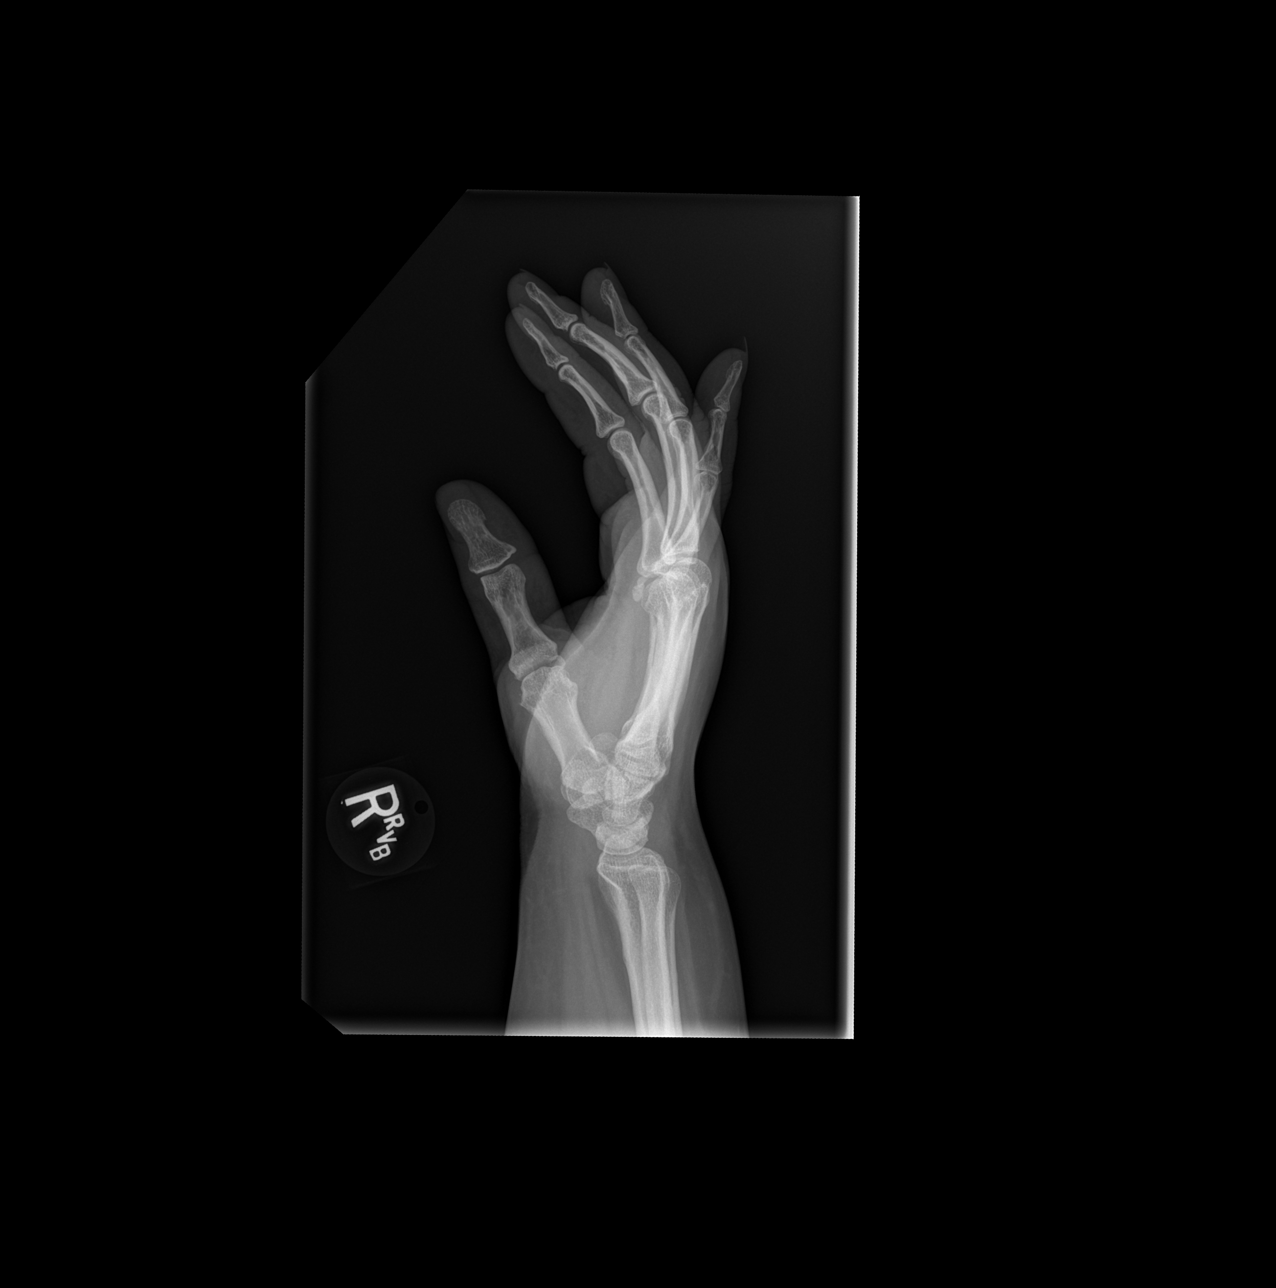

[3 of 3 positions shown; findings below may reference images not displayed]

FINDINGS: No acute fracture of the right hand or wrist. No malalignment. Joint
spaces are relatively preserved. Soft tissues within normal limits.
IMPRESSION: No acute osseous abnormality of the right hand or wrist.

## 2021-11-23 ENCOUNTER — Other Ambulatory Visit: Payer: Self-pay

## 2021-11-23 ENCOUNTER — Ambulatory Visit (INDEPENDENT_AMBULATORY_CARE_PROVIDER_SITE_OTHER): Payer: Medicaid Other | Admitting: Internal Medicine

## 2021-11-23 ENCOUNTER — Encounter: Payer: Self-pay | Admitting: Internal Medicine

## 2021-11-23 VITALS — BP 126/83 | HR 77 | Temp 98.2°F | Ht 63.0 in | Wt 274.0 lb

## 2021-11-23 DIAGNOSIS — H9311 Tinnitus, right ear: Secondary | ICD-10-CM | POA: Diagnosis not present

## 2021-11-23 DIAGNOSIS — Z1159 Encounter for screening for other viral diseases: Secondary | ICD-10-CM | POA: Diagnosis not present

## 2021-11-23 DIAGNOSIS — E66813 Obesity, class 3: Secondary | ICD-10-CM

## 2021-11-23 DIAGNOSIS — Z1211 Encounter for screening for malignant neoplasm of colon: Secondary | ICD-10-CM

## 2021-11-23 DIAGNOSIS — E119 Type 2 diabetes mellitus without complications: Secondary | ICD-10-CM | POA: Diagnosis not present

## 2021-11-23 DIAGNOSIS — Z0000 Encounter for general adult medical examination without abnormal findings: Secondary | ICD-10-CM

## 2021-11-23 DIAGNOSIS — Z419 Encounter for procedure for purposes other than remedying health state, unspecified: Secondary | ICD-10-CM | POA: Diagnosis not present

## 2021-11-23 LAB — POCT GLYCOSYLATED HEMOGLOBIN (HGB A1C): Hemoglobin A1C: 6.3 % — AB (ref 4.0–5.6)

## 2021-11-23 LAB — GLUCOSE, CAPILLARY: Glucose-Capillary: 99 mg/dL (ref 70–99)

## 2021-11-23 NOTE — Assessment & Plan Note (Signed)
Reports new tinnitus with associated ear pain in left ear for 1 day. No other sxs. R ear is fine. She thought something was "stuck in the ear". She reports resolution of all sxs at this time. No hearing loss and ear exam wnl. No new neurological changes. Denies vertigo, vision changes, and hearing changes at this time. Will continue to monitor during follow up visit.  ?

## 2021-11-23 NOTE — Assessment & Plan Note (Signed)
A1c improved from 6.7 to 6.3 compared to 3 months ago. Reports daily exercise and eating more healthy. No recent weight change unfortunately. She continues to take [place] with excellent compliance. Denies urinary sxs and denies numbness or tingling.  ? ?-Continue [place]  ?-A1c in 3 mo  ?-Continue with lifestyle modifications ?-Weight loss clinic referral placed  ?ophthalmology referral for diabetic eye exam placed  ?

## 2021-11-23 NOTE — Assessment & Plan Note (Signed)
BMI 40 and weight 274 lbs with no recent weight change. She reports eating well and has increased activity lately in hopes of weight loss. She is requesting weight clinic referral today. This is appropriate given multiple comorbidities and pt is highly motivated at this time.  ? ?-Placed referral to weight loss clinic today ?-Encouraged to continue with diet, daily exercise and medication compliance.  ?

## 2021-11-23 NOTE — Patient Instructions (Addendum)
Someone from our office will be in touch with you to for colonoscopy, eye exam and weight loss clinic referral.  ? ?Please go to your pharmacy for COVID vaccine booster.  ? ?I will call you for A1c and hep C results.  ? ?I have completed your handicap sticker and gave it to you.  ?

## 2021-11-23 NOTE — Assessment & Plan Note (Addendum)
-   GI referral for colonoscopy placed  ?- Ophto referral for annual diabetic eye exam placed  ?- Weight loss clinic referral placed  ?- Hep C screening done this visit  ?- Deferred Pap smear during next visit  ?- Handicap sticker form filled out for renewal  ?- COVID booster at pharmacy  ? ?Addendum: ?Hep C negative  ?

## 2021-11-23 NOTE — Progress Notes (Signed)
? ?CC: weight loss clinic referral, health maintenance   ? ?HPI: ? ?[name] is a 51 y.o. female with a PMHx stated below and presents today for stated above. Please see the Encounters tab for problem-based Assessment & Plan for additional details.  ? ?Past Medical History:  ?Diagnosis Date  ? Acute respiratory failure with hypoxia ([place]) 06/18/2019  ? Anemia   ? Anxiety   ? Arthritis   ? "left knee"  ? Chronic lower back pain   ? "on the left side"  ? Headache(784.0) 02/29/12  ? "frequent"  ? Hyperkalemia 10/30/2018  ? Hypertension   ? Left knee pain 11/04/2015  ? Migraines   ? Pneumonia 2006  ? Post viral syndrome 07/17/2019  ? Renal insufficiency 06/18/2019  ? Sleep apnea   ? CPAP  ? Stress at home   ? "and at work"  ? ? ?Current Outpatient Medications on File Prior to Visit  ?Medication Sig Dispense Refill  ? Accu-Chek Softclix Lancets lancets Use as instructed 100 each 12  ? albuterol (PROAIR HFA) 108 (90 Base) MCG/ACT inhaler Inhale 1-2 puffs into the lungs every 6 (six) hours as needed for wheezing or shortness of breath. 8 g 2  ? Blood Glucose Monitoring Suppl (ACCU-CHEK GUIDE) w/Device KIT Use to test blood sugar in the morning, Dx E11.9 1 kit 0  ? dapagliflozin propanediol (FARXIGA) 5 MG TABS tablet Take 1 tablet (5 mg total) by mouth daily. 90 tablet 3  ? fluticasone (FLONASE) 50 MCG/ACT nasal spray Place 2 sprays into both nostrils daily. (Patient taking differently: Place 1 spray into both nostrils daily as needed. ) 16 g 0  ? glucose blood (ACCU-CHEK GUIDE) test strip Use as instructed 100 each 12  ? lisinopril (ZESTRIL) 10 MG tablet Take 2 tablets (20 mg total) by mouth daily. 90 tablet 3  ? rosuvastatin (CRESTOR) 20 MG tablet Take 1 tablet (20 mg total) by mouth daily. 90 tablet 2  ? ?No current facility-administered medications on file prior to visit.  ? ? ?Family History  ?Problem Relation Age of Onset  ? Cancer Other   ? Breast cancer Neg Hx   ? ? ?Social History  ? ?Socioeconomic History  ?  Marital status: Single  ?  Spouse name: Not on file  ? Number of children: Not on file  ? Years of education: Not on file  ? Highest education level: Not on file  ?Occupational History  ? Not on file  ?Tobacco Use  ? Smoking status: Never  ? Smokeless tobacco: Never  ?Vaping Use  ? Vaping Use: Never used  ?Substance and Sexual Activity  ? Alcohol use: No  ?  Alcohol/week: 0.0 standard drinks  ? Drug use: No  ? Sexual activity: Not on file  ?Other Topics Concern  ? Not on file  ?Social History Narrative  ? Not on file  ? ?Social Determinants of Health  ? ?Financial Resource Strain: Not on file  ?Food Insecurity: Not on file  ?Transportation Needs: Not on file  ?Physical Activity: Not on file  ?Stress: Not on file  ?Social Connections: Not on file  ?Intimate Partner Violence: Not on file  ? ? ?Review of Systems: ?ROS negative except for what is noted on the assessment and plan. ? ?Vitals:  ? 11/23/21 0946  ?BP: 126/83  ?Pulse: 77  ?Temp: 98.2 ?F (36.8 ?C)  ?TempSrc: Oral  ?SpO2: 100%  ?Weight: 274 lb (124.3 kg)  ?Height: _0  (1.6 m)  ? ? ?  Physical Exam: ?Constitutional: alert, well-appearing, in NAD ?HENT: normocephalic, atraumatic, mucous membranes moist, no erythema of ear canal, no indication of otitis externa or interna. ?Eyes: conjunctiva non-erythematous, EOMI ?Cardiovascular: RRR, no m/r/g, non-edematous bilateral LE ?Pulmonary/Chest: normal work of breathing on RA, LCTAB ?Abdominal: soft, non-tender to palpation, non-distended ?Neurological: A&O x 3, 5/5 strength in bilateral upper and lower extremities  ?Skin: warm and dry ?Psych: normal behavior, normal affect  ? ?Assessment & Plan:  ? ?See Encounters Tab for problem based charting. ? ?Patient discussed with Dr. Saverio Danker ? ?Lajean Manes, MD  ?Internal Medicine Resident, PGY-1 ?[facility]  ?

## 2021-11-24 LAB — HCV AB W REFLEX TO QUANT PCR: HCV Ab: NONREACTIVE

## 2021-11-24 LAB — HCV INTERPRETATION

## 2021-11-24 NOTE — Addendum Note (Signed)
Addended by: Charise Killian on: 11/24/2021 09:30 AM ? ? Modules accepted: Level of Service ? ?

## 2021-11-24 NOTE — Progress Notes (Signed)
Internal Medicine Clinic Attending ? ?Case discussed with Dr. Posey Pronto  At the time of the visit.  We reviewed the resident?s history and exam and pertinent patient test results.  I agree with the assessment, diagnosis, and plan of care documented in the resident?s note. Discussed starting GLP-1 agonist for diabetes and weight loss, however patient requested referral to weight loss clinic for further discussion. ? ?

## 2021-12-01 ENCOUNTER — Encounter: Payer: Self-pay | Admitting: Internal Medicine

## 2021-12-06 ENCOUNTER — Encounter: Payer: Self-pay | Admitting: Internal Medicine

## 2021-12-24 DIAGNOSIS — Z419 Encounter for procedure for purposes other than remedying health state, unspecified: Secondary | ICD-10-CM | POA: Diagnosis not present

## 2021-12-26 DIAGNOSIS — G4733 Obstructive sleep apnea (adult) (pediatric): Secondary | ICD-10-CM | POA: Diagnosis not present

## 2022-01-23 DIAGNOSIS — Z419 Encounter for procedure for purposes other than remedying health state, unspecified: Secondary | ICD-10-CM | POA: Diagnosis not present

## 2022-02-02 ENCOUNTER — Other Ambulatory Visit: Payer: Self-pay | Admitting: Student

## 2022-02-06 ENCOUNTER — Other Ambulatory Visit: Payer: Self-pay

## 2022-02-06 DIAGNOSIS — E119 Type 2 diabetes mellitus without complications: Secondary | ICD-10-CM

## 2022-02-07 ENCOUNTER — Other Ambulatory Visit: Payer: Self-pay | Admitting: Internal Medicine

## 2022-02-09 ENCOUNTER — Encounter (INDEPENDENT_AMBULATORY_CARE_PROVIDER_SITE_OTHER): Payer: Self-pay

## 2022-02-23 DIAGNOSIS — Z419 Encounter for procedure for purposes other than remedying health state, unspecified: Secondary | ICD-10-CM | POA: Diagnosis not present

## 2022-03-16 ENCOUNTER — Ambulatory Visit (INDEPENDENT_AMBULATORY_CARE_PROVIDER_SITE_OTHER): Payer: Medicaid Other | Admitting: Family Medicine

## 2022-03-16 ENCOUNTER — Encounter (INDEPENDENT_AMBULATORY_CARE_PROVIDER_SITE_OTHER): Payer: Self-pay | Admitting: Family Medicine

## 2022-03-16 VITALS — BP 119/88 | HR 69 | Temp 98.1°F | Ht 62.0 in | Wt 267.0 lb

## 2022-03-16 DIAGNOSIS — E1159 Type 2 diabetes mellitus with other circulatory complications: Secondary | ICD-10-CM

## 2022-03-16 DIAGNOSIS — D508 Other iron deficiency anemias: Secondary | ICD-10-CM

## 2022-03-16 DIAGNOSIS — R0602 Shortness of breath: Secondary | ICD-10-CM | POA: Diagnosis not present

## 2022-03-16 DIAGNOSIS — I152 Hypertension secondary to endocrine disorders: Secondary | ICD-10-CM

## 2022-03-16 DIAGNOSIS — R5383 Other fatigue: Secondary | ICD-10-CM

## 2022-03-16 DIAGNOSIS — E559 Vitamin D deficiency, unspecified: Secondary | ICD-10-CM | POA: Diagnosis not present

## 2022-03-16 DIAGNOSIS — Z7984 Long term (current) use of oral hypoglycemic drugs: Secondary | ICD-10-CM

## 2022-03-16 DIAGNOSIS — E785 Hyperlipidemia, unspecified: Secondary | ICD-10-CM

## 2022-03-16 DIAGNOSIS — E1169 Type 2 diabetes mellitus with other specified complication: Secondary | ICD-10-CM

## 2022-03-16 DIAGNOSIS — E1165 Type 2 diabetes mellitus with hyperglycemia: Secondary | ICD-10-CM

## 2022-03-16 DIAGNOSIS — Z1331 Encounter for screening for depression: Secondary | ICD-10-CM | POA: Diagnosis not present

## 2022-03-16 DIAGNOSIS — Z6841 Body Mass Index (BMI) 40.0 and over, adult: Secondary | ICD-10-CM

## 2022-03-16 DIAGNOSIS — G4733 Obstructive sleep apnea (adult) (pediatric): Secondary | ICD-10-CM | POA: Diagnosis not present

## 2022-03-16 DIAGNOSIS — E66813 Obesity, class 3: Secondary | ICD-10-CM

## 2022-03-20 DIAGNOSIS — I152 Hypertension secondary to endocrine disorders: Secondary | ICD-10-CM | POA: Diagnosis not present

## 2022-03-20 DIAGNOSIS — E1165 Type 2 diabetes mellitus with hyperglycemia: Secondary | ICD-10-CM | POA: Diagnosis not present

## 2022-03-20 DIAGNOSIS — E1159 Type 2 diabetes mellitus with other circulatory complications: Secondary | ICD-10-CM | POA: Diagnosis not present

## 2022-03-20 DIAGNOSIS — E785 Hyperlipidemia, unspecified: Secondary | ICD-10-CM | POA: Diagnosis not present

## 2022-03-20 DIAGNOSIS — E559 Vitamin D deficiency, unspecified: Secondary | ICD-10-CM | POA: Diagnosis not present

## 2022-03-20 DIAGNOSIS — R5383 Other fatigue: Secondary | ICD-10-CM | POA: Diagnosis not present

## 2022-03-20 DIAGNOSIS — E1169 Type 2 diabetes mellitus with other specified complication: Secondary | ICD-10-CM | POA: Diagnosis not present

## 2022-03-20 DIAGNOSIS — D508 Other iron deficiency anemias: Secondary | ICD-10-CM | POA: Diagnosis not present

## 2022-03-21 LAB — T4: T4, Total: 9.6 ug/dL (ref 4.5–12.0)

## 2022-03-21 LAB — LIPID PANEL WITH LDL/HDL RATIO
Cholesterol, Total: 168 mg/dL (ref 100–199)
HDL: 57 mg/dL (ref >39)
LDL Chol Calc (NIH): 100 mg/dL — ABNORMAL HIGH (ref 0–99)
LDL/HDL Ratio: 1.8 ratio (ref 0.0–3.2)
Triglycerides: 57 mg/dL (ref 0–149)
VLDL Cholesterol Cal: 11 mg/dL (ref 5–40)

## 2022-03-21 LAB — CBC WITH DIFFERENTIAL/PLATELET
Basophils Absolute: 0.1 10*3/uL (ref 0.0–0.2)
Basos: 1 %
EOS (ABSOLUTE): 0.3 10*3/uL (ref 0.0–0.4)
Eos: 4 %
Hematocrit: 40.3 % (ref 34.0–46.6)
Hemoglobin: 12.5 g/dL (ref 11.1–15.9)
Immature Grans (Abs): 0 10*3/uL (ref 0.0–0.1)
Immature Granulocytes: 0 %
Lymphocytes Absolute: 2 10*3/uL (ref 0.7–3.1)
Lymphs: 27 %
MCH: 25.7 pg — ABNORMAL LOW (ref 26.6–33.0)
MCHC: 31 g/dL — ABNORMAL LOW (ref 31.5–35.7)
MCV: 83 fL (ref 79–97)
Monocytes Absolute: 0.6 10*3/uL (ref 0.1–0.9)
Monocytes: 9 %
Neutrophils Absolute: 4.3 10*3/uL (ref 1.4–7.0)
Neutrophils: 59 %
Platelets: 390 10*3/uL (ref 150–450)
RBC: 4.87 x10E6/uL (ref 3.77–5.28)
RDW: 14.5 % (ref 11.7–15.4)
WBC: 7.3 10*3/uL (ref 3.4–10.8)

## 2022-03-21 LAB — COMPREHENSIVE METABOLIC PANEL
ALT: 18 IU/L (ref 0–32)
AST: 18 IU/L (ref 0–40)
Albumin/Globulin Ratio: 1.2 (ref 1.2–2.2)
Albumin: 3.9 g/dL (ref 3.8–4.8)
Alkaline Phosphatase: 94 IU/L (ref 44–121)
BUN/Creatinine Ratio: 10 (ref 9–23)
BUN: 8 mg/dL (ref 6–24)
Bilirubin Total: 0.3 mg/dL (ref 0.0–1.2)
CO2: 22 mmol/L (ref 20–29)
Calcium: 8.9 mg/dL (ref 8.7–10.2)
Chloride: 104 mmol/L (ref 96–106)
Creatinine, Ser: 0.81 mg/dL (ref 0.57–1.00)
Globulin, Total: 3.2 g/dL (ref 1.5–4.5)
Glucose: 115 mg/dL — ABNORMAL HIGH (ref 70–99)
Potassium: 4.4 mmol/L (ref 3.5–5.2)
Sodium: 140 mmol/L (ref 134–144)
Total Protein: 7.1 g/dL (ref 6.0–8.5)
eGFR: 89 mL/min/{1.73_m2} (ref >59)

## 2022-03-21 LAB — HEMOGLOBIN A1C
Est. average glucose Bld gHb Est-mCnc: 148 mg/dL
Hgb A1c MFr Bld: 6.8 % — ABNORMAL HIGH (ref 4.8–5.6)

## 2022-03-21 LAB — VITAMIN D 25 HYDROXY (VIT D DEFICIENCY, FRACTURES): Vit D, 25-Hydroxy: 22.4 ng/mL — ABNORMAL LOW (ref 30.0–100.0)

## 2022-03-21 LAB — TSH: TSH: 2.62 u[IU]/mL (ref 0.450–4.500)

## 2022-03-21 LAB — INSULIN, RANDOM: INSULIN: 20 u[IU]/mL (ref 2.6–24.9)

## 2022-03-21 LAB — T3: T3, Total: 166 ng/dL (ref 71–180)

## 2022-03-21 NOTE — Progress Notes (Signed)
Chief Complaint:   OBESITY [name] [name] (MR# [id number]) is a 51 y.o. female who presents for evaluation and treatment of obesity and related comorbidities. Current BMI is Body mass index is 48.83 kg/m. [name] [name] has been struggling with her weight for many years and has been unsuccessful in either losing weight, maintaining weight loss, or reaching her healthy weight goal.  [name] [name] was referred by Dr. Posey Pronto (internal medicine).  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  Her hours are from 6 AM-3 PM.  Lunch from 1-3 PM.  Struggle with weight throughout her life.  Skips breakfast and lunch most days (due to time and lack of hunger).  Soda in the a.m., Pepsi or Dr. Malachi Bonds.  Lunch-[employer]Donald[employer]/burger, cheeseburger and large fries and large Coke (satisfied).  Around 730/8 PM-sandwich from [employer] combo, lettuce, tomatoes, onions, ranch, and honey mustard, jalapeno, and eats foot long with chips, and soda or juice.  Around 9/10 PM-snacks on cakes/honey bun and soda.  [name] [name] is currently in the action stage of change and ready to dedicate time achieving and maintaining a healthier weight. [name] [name] is interested in becoming our patient and working on intensive lifestyle modifications including (but not limited to) diet and exercise for weight loss.  [name] [name]'s habits were reviewed today and are as follows: Her family eats meals together, she thinks her family will eat healthier with her, her heaviest weight ever was 280 pounds, she is a picky eater and doesn't like to eat healthier foods, she has significant food cravings issues, she snacks frequently in the evenings, she skips meals frequently, she is frequently drinking liquids with calories, she frequently makes poor food choices, and she struggles with emotional eating.  Depression Screen [name] [name]'s Food and Mood (modified PHQ-9) score was 12.     03/16/2022    8:39 AM  Depression screen PHQ 2/9  Decreased Interest 1  Down,  Depressed, Hopeless 1  PHQ - 2 Score 2  Altered sleeping 3  Tired, decreased energy 2  Change in appetite 3  Feeling bad or failure about yourself  1  Trouble concentrating 1  Moving slowly or fidgety/restless 0  Suicidal thoughts 0  PHQ-9 Score 12  Difficult doing work/chores Not difficult at all   Subjective:   1. Other fatigue [name] [name] admits to daytime somnolence and admits to waking up still tired. Patient has a history of symptoms of daytime fatigue, morning fatigue, and morning headache. [name] [name] generally gets 8 hours of sleep per night, and states that she has nightime awakenings. Snoring is present. Apneic episodes are present. Epworth Sleepiness Score is 8.  EKG-normal sinus rhythm.  2. SOB (shortness of breath) on exertion [name] [name] notes increasing shortness of breath with exercising and seems to be worsening over time with weight gain. She notes getting out of breath sooner with activity than she used to. This has not gotten worse recently. [name] [name] denies shortness of breath at rest or orthopnea.  3. Type 2 diabetes mellitus with hyperglycemia, without long-term current use of insulin (HCC) [name] [name] was previously on metformin, now on Farxiga.  She has an eye exam coming up.  She has never been on other medications except for the 2 that are listed.  4. Hypertension associated with diabetes ([place]) [name] [name] has been on lisinopril since 2009.  Her blood pressure is normally well controlled.  5. Hyperlipidemia associated with type 2 diabetes mellitus ([place]) [name] [name] was diagnosed a year ago.  She is on rosuvastatin 20 mg.  6.  OSA (obstructive sleep apnea) [name] [name] has a CPAP machine.  She starts nightly with CPAP, but 3-4 nights per week she does not wear it the entire night.  7. Other iron deficiency anemia [name] [name] was told that she had low iron and 2006.  She was on iron tablets previously.  8. Vitamin D deficiency [name] [name] is not on prescription or OTC vitamin D, and she notes  fatigue.  Assessment/Plan:   1. Other fatigue [name] [name] does feel that her weight is causing her energy to be lower than it should be. Fatigue may be related to obesity, depression or many other causes. Labs will be ordered, and in the meanwhile, [name] [name] will focus on self care including making healthy food choices, increasing physical activity and focusing on stress reduction.  - EKG 12-Lead - TSH - T4 - T3  2. SOB (shortness of breath) on exertion [name] [name] does feel that she gets out of breath more easily that she used to when she exercises. [name] [name]'s shortness of breath appears to be obesity related and exercise induced. She has agreed to work on weight loss and gradually increase exercise to treat her exercise induced shortness of breath. Will continue to monitor closely.  3. Type 2 diabetes mellitus with hyperglycemia, without long-term current use of insulin (HCC) We will check labs today, and we will follow-up on lab results at [place] next appointment.   - Hemoglobin A1c - Insulin, random  4. Hypertension associated with diabetes ([place]) EKG was done today. We will check labs today, and we will follow-up on lab results at [place] next appointment.   - Comprehensive metabolic panel  5. Hyperlipidemia associated with type 2 diabetes mellitus ([place]) We will check lipid panel today, and we will follow-up on lab results at [place] next appointment.   - Lipid Panel With LDL/HDL Ratio  6. OSA (obstructive sleep apnea) [name] [name] is to wear her CPAP nightly, and we will follow up on her compliance at her next appointment.  7. Other iron deficiency anemia We will check labs today, and we will follow-up on lab results at [place] next appointment.   - CBC with Differential/Platelet  8. Vitamin D deficiency We will check Vitamin D level today, and we will follow-up on lab results at [place] next appointment.   - VITAMIN D 25 Hydroxy (Vit-D Deficiency, Fractures)  9.  Depression screening [name] [name] had a positive depression screening. Depression is commonly associated with obesity and often results in emotional eating behaviors. We will monitor this closely and work on CBT to help improve the non-hunger eating patterns. Referral to Psychology may be required if no improvement is seen as she continues in our clinic.  10. Class 3 severe obesity with serious comorbidity and body mass index (BMI) of 45.0 to 49.9 in adult, unspecified obesity type (HCC) [name] [name] is currently in the action stage of change and her goal is to continue with weight loss efforts. I recommend [name] [name] begin the structured treatment plan as follows:  She has agreed to the Category 3 Plan.  Exercise goals: No exercise has been prescribed at this time.   Behavioral modification strategies: increasing lean protein intake, decreasing eating out, no skipping meals, meal planning and cooking strategies, keeping healthy foods in the home, and planning for success.  She was informed of the importance of frequent follow-up visits to maximize her success with intensive lifestyle modifications for her multiple health conditions. She was informed we would discuss her lab results at her next visit unless there is a critical  issue that needs to be addressed sooner. [name] [name] agreed to keep her next visit at the agreed upon time to discuss these results.  Objective:   Blood pressure 119/88, pulse 69, temperature 98.1 F (36.7 C), height 5\' 2"  (1.575 m), weight 267 lb (121.1 kg), SpO2 99 %. Body mass index is 48.83 kg/m.  EKG: Normal sinus rhythm, rate 72 BPM.  Indirect Calorimeter completed today shows a VO2 of 254 and a REE of 1757.  Her calculated basal metabolic rate is thus her basal metabolic rate is worse than expected.  General: Cooperative, alert, well developed, in no acute distress. HEENT: Conjunctivae and lids unremarkable. Cardiovascular: Regular rhythm.  Lungs: Normal work of  breathing. Neurologic: No focal deficits.   Lab Results  Component Value Date   CREATININE 0.81 03/20/2022   BUN 8 03/20/2022   NA 140 03/20/2022   K 4.4 03/20/2022   CL 104 03/20/2022   CO2 22 03/20/2022   Lab Results  Component Value Date   ALT 18 03/20/2022   AST 18 03/20/2022   ALKPHOS 94 03/20/2022   BILITOT 0.3 03/20/2022   Lab Results  Component Value Date   HGBA1C 6.8 (H) 03/20/2022   HGBA1C 6.3 (A) 11/23/2021   HGBA1C 6.7 (A) 08/03/2021   HGBA1C 7.0 (A) 12/09/2020   HGBA1C 6.7 (A) 07/07/2020   Lab Results  Component Value Date   INSULIN 20.0 03/20/2022   Lab Results  Component Value Date   TSH 2.620 03/20/2022   Lab Results  Component Value Date   CHOL 168 03/20/2022   HDL 57 03/20/2022   LDLCALC 100 (H) 03/20/2022   TRIG 57 03/20/2022   CHOLHDL 2.9 08/03/2021   Lab Results  Component Value Date   WBC 7.3 03/20/2022   HGB 12.5 03/20/2022   HCT 40.3 03/20/2022   MCV 83 03/20/2022   PLT 390 03/20/2022   Lab Results  Component Value Date   IRON 36 06/27/2016   TIBC 294 06/27/2016   FERRITIN 140 12/09/2020   Attestation Statements:   Reviewed by clinician on day of visit: allergies, medications, problem list, medical history, surgical history, family history, social history, and previous encounter notes.  Time spent on visit including pre-visit chart review and post-visit charting and care was 45 minutes.   I, 12/11/2020, am acting as transcriptionist for Burt Knack, MD.  This is the patient's first visit at [facility]. The patient's NEW PATIENT PACKET was reviewed at length. Included in the packet: current and past health history, medications, allergies, ROS, gynecologic history (women only), surgical history, family history, social history, weight history, weight loss surgery history (for those that have had weight loss surgery), nutritional evaluation, mood and food questionnaire, PHQ9, Epworth questionnaire, sleep  habits questionnaire, patient life and health improvement goals questionnaire. These will all be scanned into the patient's chart under media.   During the visit, I independently reviewed the patient's EKG, bioimpedance scale results, and indirect calorimeter results. I used this information to tailor a meal plan for the patient that will help her to lose weight and will improve her obesity-related conditions going forward. I performed a medically necessary appropriate examination and/or evaluation. I discussed the assessment and treatment plan with the patient. The patient was provided an opportunity to ask questions and all were answered. The patient agreed with the plan and demonstrated an understanding of the instructions. Labs were ordered at this visit and will be reviewed at the next visit unless more critical results  need to be addressed immediately. Clinical information was updated and documented in the EMR.   I have reviewed the above documentation for accuracy and completeness, and I agree with the above. - Reuben Likes, MD

## 2022-03-25 DIAGNOSIS — Z419 Encounter for procedure for purposes other than remedying health state, unspecified: Secondary | ICD-10-CM | POA: Diagnosis not present

## 2022-03-30 ENCOUNTER — Encounter (INDEPENDENT_AMBULATORY_CARE_PROVIDER_SITE_OTHER): Payer: Self-pay | Admitting: Family Medicine

## 2022-03-30 ENCOUNTER — Ambulatory Visit (INDEPENDENT_AMBULATORY_CARE_PROVIDER_SITE_OTHER): Payer: Medicaid Other | Admitting: Family Medicine

## 2022-03-30 VITALS — BP 127/83 | HR 79 | Temp 97.9°F | Ht 62.0 in | Wt 267.0 lb

## 2022-03-30 DIAGNOSIS — Z6841 Body Mass Index (BMI) 40.0 and over, adult: Secondary | ICD-10-CM | POA: Diagnosis not present

## 2022-03-30 DIAGNOSIS — E1159 Type 2 diabetes mellitus with other circulatory complications: Secondary | ICD-10-CM | POA: Diagnosis not present

## 2022-03-30 DIAGNOSIS — I152 Hypertension secondary to endocrine disorders: Secondary | ICD-10-CM | POA: Diagnosis not present

## 2022-03-30 DIAGNOSIS — Z7984 Long term (current) use of oral hypoglycemic drugs: Secondary | ICD-10-CM

## 2022-03-30 DIAGNOSIS — E669 Obesity, unspecified: Secondary | ICD-10-CM | POA: Diagnosis not present

## 2022-03-30 DIAGNOSIS — E1169 Type 2 diabetes mellitus with other specified complication: Secondary | ICD-10-CM

## 2022-03-30 DIAGNOSIS — E559 Vitamin D deficiency, unspecified: Secondary | ICD-10-CM

## 2022-03-30 DIAGNOSIS — E785 Hyperlipidemia, unspecified: Secondary | ICD-10-CM

## 2022-03-30 DIAGNOSIS — E1165 Type 2 diabetes mellitus with hyperglycemia: Secondary | ICD-10-CM | POA: Diagnosis not present

## 2022-03-30 MED ORDER — BD PEN NEEDLE NANO 2ND GEN 32G X 4 MM MISC: 1.0000 | Freq: Two times a day (BID) | 0 refills | Status: DC

## 2022-03-30 MED ORDER — VITAMIN D (ERGOCALCIFEROL) 1.25 MG (50000 UNIT) PO CAPS: 50000.0000 [IU] | ORAL_CAPSULE | ORAL | 0 refills | Status: DC

## 2022-03-30 MED ORDER — OZEMPIC (0.25 OR 0.5 MG/DOSE) 2 MG/3ML ~~LOC~~ SOPN: 0.2500 mg | PEN_INJECTOR | SUBCUTANEOUS | 0 refills | Status: DC

## 2022-04-03 ENCOUNTER — Other Ambulatory Visit: Payer: Self-pay

## 2022-04-03 ENCOUNTER — Encounter: Payer: Self-pay | Admitting: Student

## 2022-04-03 ENCOUNTER — Ambulatory Visit (INDEPENDENT_AMBULATORY_CARE_PROVIDER_SITE_OTHER): Payer: Medicaid Other | Admitting: Student

## 2022-04-03 VITALS — BP 134/85 | HR 77 | Temp 98.0°F | Ht 62.0 in | Wt 274.3 lb

## 2022-04-03 DIAGNOSIS — Z6841 Body Mass Index (BMI) 40.0 and over, adult: Secondary | ICD-10-CM | POA: Diagnosis not present

## 2022-04-03 DIAGNOSIS — G4733 Obstructive sleep apnea (adult) (pediatric): Secondary | ICD-10-CM | POA: Diagnosis not present

## 2022-04-03 DIAGNOSIS — I10 Essential (primary) hypertension: Secondary | ICD-10-CM | POA: Diagnosis not present

## 2022-04-03 DIAGNOSIS — E119 Type 2 diabetes mellitus without complications: Secondary | ICD-10-CM | POA: Diagnosis not present

## 2022-04-03 DIAGNOSIS — Z0000 Encounter for general adult medical examination without abnormal findings: Secondary | ICD-10-CM

## 2022-04-03 DIAGNOSIS — Z7985 Long-term (current) use of injectable non-insulin antidiabetic drugs: Secondary | ICD-10-CM

## 2022-04-03 DIAGNOSIS — E66813 Obesity, class 3: Secondary | ICD-10-CM

## 2022-04-03 NOTE — Assessment & Plan Note (Addendum)
A1c was 6.8 on 6/26. She was started on Ozempic 0.25 mg weekly.  She did not pick up this medication yet.  Weight loss clinic has stopped her [place].  She could not tolerate metformin in the past due to side effects.   -Continue Ozempic 0.25 mg weekly

## 2022-04-03 NOTE — Patient Instructions (Addendum)
[name] [name],  It was nice seeing you in the clinic today.  Here is a summary what we talked about:  1.  Sleep apnea: I placed a new order for sleep study.  They should reach out to you for an appointment.  2.  Your blood pressure is well controlled.  Please continue lisinopril 10 mg daily  3.  Your A1c was 6.8.  Please pick up your Ozempic.  4.  For your colonoscopy, please call [facility] at [phone] option 1  5.  We will schedule your Pap smear at next visit.  Please return in 3 months, sooner if needed  Take care  Dr. Cyndie Chime

## 2022-04-03 NOTE — Progress Notes (Signed)
CC: Need new CPAP and sleep study  HPI:  [name] is a 51 y.o. with past medical history of hypertension, type 2 diabetes, obesity, OSA who presents to clinic needing new CPAP and sleep study.  Please see problem based charting for detail  Past Medical History:  Diagnosis Date   Acute respiratory failure with hypoxia (HCC) 06/18/2019   Anemia    Anxiety    Arthritis    "left knee"   Asthma    Back pain    Chronic lower back pain    "on the left side"   Diabetes (HCC)    Food allergy    Gallbladder problem    Headache(784.0) 02/29/2012   "frequent"   High cholesterol    Hyperkalemia 10/30/2018   Hypertension    Left knee pain 11/04/2015   Migraines    Over weight    Pneumonia 2006   Post viral syndrome 07/17/2019   Renal insufficiency 06/18/2019   Sleep apnea    CPAP   SOB (shortness of breath)    Stress at home    "and at work"   Review of Systems:  per HPI  Physical Exam:  Vitals:   04/03/22 1625  BP: 134/85  Pulse: 77  Temp: 98 F (36.7 C)  TempSrc: Oral  SpO2: 100%  Weight: 274 lb 4.8 oz (124.4 kg)  Height: 5\' 2"  (1.575 m)   Physical Exam Constitutional:      General: She is not in acute distress.    Appearance: She is not ill-appearing.  HENT:     Head: Normocephalic.  Eyes:     General:        Right eye: No discharge.        Left eye: No discharge.     Conjunctiva/sclera: Conjunctivae normal.  Cardiovascular:     Rate and Rhythm: Normal rate and regular rhythm.     Comments: Trace LE edema Pulmonary:     Effort: Pulmonary effort is normal. No respiratory distress.     Breath sounds: Normal breath sounds. No wheezing.  Abdominal:     General: Bowel sounds are normal. There is no distension.     Palpations: Abdomen is soft.     Tenderness: There is no abdominal tenderness.  Musculoskeletal:        General: Normal range of motion.  Skin:    General: Skin is warm.  Neurological:     General: No focal deficit present.      Mental Status: She is alert.  Psychiatric:        Mood and Affect: Mood normal.      Assessment & Plan:   See Encounters Tab for problem based charting.  Essential hypertension Blood pressures well controlled 130/85. She is currently taking lisinopril 10 mg daily.  She has extra supplies because she was on 20 mg but cut down to 10 mg due to hypotension. Last CMP 6/26 showed normal kidney function  -Continue lisinopril 10 mg daily  Type 2 diabetes mellitus treated without insulin (HCC) A1c was 6.8 on 6/26. She was started on Ozempic 0.25 mg weekly.  She did not pick up this medication yet.  Weight loss clinic has stopped her 7/26.  She could not tolerate metformin in the past due to side effects.   -Continue Ozempic 0.25 mg weekly   Class 3 obesity (HCC) - Continue follow-up with weight loss clinic -Continue Ozempic 0.25 mg weekly  OSA (obstructive sleep apnea) She was diagnosed with OSA  in 2017 with a sleep study.  I could not see the result.  She was on CPAP and was doing well but the mask has been causing discomfort recently after her weight gain.  She discussed this with weight loss clinic and they recommended repeat a sleep study with a new CPAP.  -Ambulatory referral to sleep study.  Health care maintenance - Patient Is due for colonoscopy.  [facility] has reached out to her but she did not reply.  I gave her the phone number to call back. -Could not perform Pap smear today.  Plan for next visit.   Patient discussed with Dr. Mayford Knife

## 2022-04-03 NOTE — Assessment & Plan Note (Signed)
Blood pressures well controlled 130/85. She is currently taking lisinopril 10 mg daily.  She has extra supplies because she was on 20 mg but cut down to 10 mg due to hypotension. Last CMP 6/26 showed normal kidney function  -Continue lisinopril 10 mg daily

## 2022-04-03 NOTE — Assessment & Plan Note (Signed)
She was diagnosed with OSA in 2017 with a sleep study.  I could not see the result.  She was on CPAP and was doing well but the mask has been causing discomfort recently after her weight gain.  She discussed this with weight loss clinic and they recommended repeat a sleep study with a new CPAP.  -Ambulatory referral to sleep study.

## 2022-04-03 NOTE — Progress Notes (Signed)
Chief Complaint:   OBESITY [name] [name] is here to discuss her progress with her obesity treatment plan along with follow-up of her obesity related diagnoses. [name] [name] is on the Category 3 Plan and states she is following her eating plan approximately 0% of the time. [name] [name] states she is walking 30 minutes 7 times per week.  Today's visit was #: 2 Starting weight: 267 lbs Starting date: 03/16/2022 Today's weight: 267 lbs Today's date: 03/30/2022 Total lbs lost to date:  0 lbs Total lbs lost since last in-office visit: 0  Interim History: [name] [name] has been eating food that she still has at home before buying food for plan. She has tried to remember certain parts of plan that she has incorporated in. She has not weighed meat yet for supper. Thinks protein will be hardest part of meal plan. She will likely go grocery shopping next week.   Subjective:   1. Vitamin D deficiency [name] [name] is not on prescription Vit D. Her last Vit D level of 22.4. She notes fatigue.  2. Hyperlipidemia associated with type 2 diabetes mellitus (HCC) [name] [name] is currently taking Crestor with no myalgias or transaminitis. Her LDL of 100, HDL of 57 and Trigly 57.  3. Type 2 diabetes mellitus with hyperglycemia, without long-term current use of insulin (HCC) [name] [name] is currently taking [place]. Her A1c was 6.8, insulin was 20.0. Diagnosed 1.5 years ago.  4. Hypertension associated with diabetes (HCC) [name] [name] blood pressure controlled today. She is currently taking Lisinopril 20 mg.   Assessment/Plan:   1. Vitamin D deficiency We will refill Vit D 50,000 IU for 1 month with 0 refills.  -Refill Vitamin D, Ergocalciferol, (DRISDOL) 1.25 MG (50000 UNIT) CAPS capsule; Take 1 capsule (50,000 Units total) by mouth every 7 (seven) days.  Dispense: 4 capsule; Refill: 0  2. Hyperlipidemia associated with type 2 diabetes mellitus (HCC) [name] [name] will continue Crestor without changes in dose; not at goal. Retest in 3  months. If still elevated will discuss change or increase statin.  3. Type 2 diabetes mellitus with hyperglycemia, without long-term current use of insulin (HCC) [name] [name] will STOP Farxiga, A1c has increased from 6.3 to 6.8 on this.  START Ozempic 0.25 mg SubQ once weekly.   -Start Semaglutide,0.25 or 0.5MG /DOS, (OZEMPIC, 0.25 OR 0.5 MG/DOSE,) 2 MG/3ML SOPN; Inject 0.25 mg into the skin once a week.  Dispense: 3 mL; Refill: 0  -Fill Insulin Pen Needle (BD PEN NEEDLE NANO 2ND GEN) 32G X 4 MM MISC; 1 Package by Does not apply route 2 (two) times daily.  Dispense: 100 each; Refill: 0  4. Hypertension associated with diabetes (HCC) [name] [name] will continue taking Lisinopril with no changes in dose. Follow up with blood pressure at next appointment.  5. Obesity with current BMI of 48.9 [name] [name] is currently in the action stage of change. As such, her goal is to continue with weight loss efforts. She has agreed to the Category 3 Plan.   Exercise goals: All adults should avoid inactivity. Some physical activity is better than none, and adults who participate in any amount of physical activity gain some health benefits.  Behavioral modification strategies: increasing lean protein intake, meal planning and cooking strategies, keeping healthy foods in the home, and planning for success.  [name] [name] has agreed to follow-up with our clinic in 3 weeks. She was informed of the importance of frequent follow-up visits to maximize her success with intensive lifestyle modifications for her multiple health conditions.   Objective:   Blood pressure  127/83, pulse 79, temperature 97.9 F (36.6 C), height 5\' 2"  (1.575 m), weight 267 lb (121.1 kg), SpO2 99 %. Body mass index is 48.83 kg/m.  General: Cooperative, alert, well developed, in no acute distress. HEENT: Conjunctivae and lids unremarkable. Cardiovascular: Regular rhythm.  Lungs: Normal work of breathing. Neurologic: No focal deficits.   Lab Results   Component Value Date   CREATININE 0.81 03/20/2022   BUN 8 03/20/2022   NA 140 03/20/2022   K 4.4 03/20/2022   CL 104 03/20/2022   CO2 22 03/20/2022   Lab Results  Component Value Date   ALT 18 03/20/2022   AST 18 03/20/2022   ALKPHOS 94 03/20/2022   BILITOT 0.3 03/20/2022   Lab Results  Component Value Date   HGBA1C 6.8 (H) 03/20/2022   HGBA1C 6.3 (A) 11/23/2021   HGBA1C 6.7 (A) 08/03/2021   HGBA1C 7.0 (A) 12/09/2020   HGBA1C 6.7 (A) 07/07/2020   Lab Results  Component Value Date   INSULIN 20.0 03/20/2022   Lab Results  Component Value Date   TSH 2.620 03/20/2022   Lab Results  Component Value Date   CHOL 168 03/20/2022   HDL 57 03/20/2022   LDLCALC 100 (H) 03/20/2022   TRIG 57 03/20/2022   CHOLHDL 2.9 08/03/2021   Lab Results  Component Value Date   VD25OH 22.4 (L) 03/20/2022   Lab Results  Component Value Date   WBC 7.3 03/20/2022   HGB 12.5 03/20/2022   HCT 40.3 03/20/2022   MCV 83 03/20/2022   PLT 390 03/20/2022   Lab Results  Component Value Date   IRON 36 06/27/2016   TIBC 294 06/27/2016   FERRITIN 140 12/09/2020   Attestation Statements:   Reviewed by clinician on day of visit: allergies, medications, problem list, medical history, surgical history, family history, social history, and previous encounter notes.  Time spent on visit including pre-visit chart review and post-visit care and charting was 50 minutes.   I, 12/11/2020, RMA am acting as transcriptionist for Fortino Sic, MD. I have reviewed the above documentation for accuracy and completeness, and I agree with the above. - Reuben Likes, MD

## 2022-04-03 NOTE — Assessment & Plan Note (Addendum)
-   Continue follow-up with weight loss clinic -Continue Ozempic 0.25 mg weekly

## 2022-04-03 NOTE — Assessment & Plan Note (Signed)
-   Patient Is due for colonoscopy.  [facility] has reached out to her but she did not reply.  I gave her the phone number to call back. -Could not perform Pap smear today.  Plan for next visit.

## 2022-04-04 ENCOUNTER — Telehealth (INDEPENDENT_AMBULATORY_CARE_PROVIDER_SITE_OTHER): Payer: Self-pay | Admitting: Family Medicine

## 2022-04-04 ENCOUNTER — Encounter (INDEPENDENT_AMBULATORY_CARE_PROVIDER_SITE_OTHER): Payer: Self-pay

## 2022-04-04 NOTE — Telephone Encounter (Signed)
Dr. Lawson Radar - Prior authorization approved for Ozempic. Effective: 04/03/2022 - 04/03/2023. Patient sent approval message via mychart.

## 2022-04-13 ENCOUNTER — Encounter: Payer: Self-pay | Admitting: Gastroenterology

## 2022-04-15 NOTE — Progress Notes (Signed)
Internal Medicine Clinic Attending  Case discussed with Dr. Nguyen  At the time of the visit.  We reviewed the resident's history and exam and pertinent patient test results.  I agree with the assessment, diagnosis, and plan of care documented in the resident's note. 

## 2022-04-20 ENCOUNTER — Encounter (INDEPENDENT_AMBULATORY_CARE_PROVIDER_SITE_OTHER): Payer: Self-pay

## 2022-04-20 ENCOUNTER — Ambulatory Visit (INDEPENDENT_AMBULATORY_CARE_PROVIDER_SITE_OTHER): Payer: Medicaid Other | Admitting: Family Medicine

## 2022-04-21 ENCOUNTER — Ambulatory Visit (AMBULATORY_SURGERY_CENTER): Payer: Self-pay | Admitting: *Deleted

## 2022-04-21 VITALS — Ht 62.0 in | Wt 269.2 lb

## 2022-04-21 DIAGNOSIS — Z1211 Encounter for screening for malignant neoplasm of colon: Secondary | ICD-10-CM

## 2022-04-21 MED ORDER — NA SULFATE-K SULFATE-MG SULF 17.5-3.13-1.6 GM/177ML PO SOLN: 1.0000 | Freq: Once | ORAL | 0 refills | Status: AC

## 2022-04-21 NOTE — Progress Notes (Signed)
No egg or soy allergy known to patient  No issues known to pt with past sedation with any surgeries or procedures Patient denies ever being told they had issues or difficulty with intubation  No FH of Malignant Hyperthermia Pt is not on diet pills Pt is not on  home 02  Pt is not on blood thinners  Pt denies issues with constipation  No A fib or A flutter Have any cardiac testing pending--no Pt instructed to use [email or web address] or GoodRx for a price reduction on prep   

## 2022-04-25 DIAGNOSIS — Z419 Encounter for procedure for purposes other than remedying health state, unspecified: Secondary | ICD-10-CM | POA: Diagnosis not present

## 2022-05-02 ENCOUNTER — Encounter: Payer: Self-pay | Admitting: Gastroenterology

## 2022-05-02 ENCOUNTER — Ambulatory Visit (AMBULATORY_SURGERY_CENTER): Payer: Medicaid Other | Admitting: Gastroenterology

## 2022-05-02 VITALS — BP 121/83 | HR 74 | Temp 97.8°F | Resp 14 | Ht 62.0 in | Wt 263.0 lb

## 2022-05-02 DIAGNOSIS — K5730 Diverticulosis of large intestine without perforation or abscess without bleeding: Secondary | ICD-10-CM

## 2022-05-02 DIAGNOSIS — Z1211 Encounter for screening for malignant neoplasm of colon: Secondary | ICD-10-CM | POA: Diagnosis not present

## 2022-05-02 MED ORDER — SODIUM CHLORIDE 0.9 % IV SOLN: 500.0000 mL | Freq: Once | INTRAVENOUS | Status: DC

## 2022-05-02 NOTE — Progress Notes (Signed)
GASTROENTEROLOGY PROCEDURE H&P NOTE   Primary Care Physician: Timothy Lasso, MD (Inactive)    Reason for Procedure:  Colon Cancer screening  Plan:    Colonoscopy  Patient is appropriate for endoscopic procedure(s) in the ambulatory ([place]) setting.  The nature of the procedure, as well as the risks, benefits, and alternatives were carefully and thoroughly reviewed with the patient. Ample time for discussion and questions allowed. The patient understood, was satisfied, and agreed to proceed.     HPI: [name] [name] is a 51 y.o. female who presents for colonoscopy for routine Colon Cancer screening.  No active GI symptoms.  No known family history of colon cancer or related malignancy.  Patient is otherwise without complaints or active issues today.  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   Over weight    Pneumonia 2006   Post viral syndrome 07/17/2019   Renal insufficiency 06/18/2019   Sleep apnea    CPAP   SOB (shortness of breath)    Stress at home    "and at work"    Past Surgical History:  Procedure Laterality Date   BREAST BIOPSY Left 07/29/2013   CESAREAN SECTION  08/2008   CHOLECYSTECTOMY  03/02/2012   Procedure: LAPAROSCOPIC CHOLECYSTECTOMY;  Surgeon: Gwenyth Ober, MD;  Location: [facility];  Service: General;;   KNEE ARTHROSCOPY WITH MENISCAL REPAIR Right 06/25/2018   Procedure: RIGHT KNEE ARTHROSCOPY WITH MENISCAL ROOT REPAIR;  Surgeon: Meredith Pel, MD;  Location: [facility];  Service: Orthopedics;   Laterality: Right;   KNEE ARTHROSCOPY WITH MENISCAL REPAIR Left 03/09/2020   Procedure: LEFT KNEE ARTHROSCOPY, MEDIAL MENISCAL ROOT REPAIR;  Surgeon: Meredith Pel, MD;  Location: [facility];  Service: Orthopedics;  Laterality: Left;   WISDOM TOOTH EXTRACTION      Prior to Admission medications   Medication Sig Start Date End Date Taking? Authorizing Provider  Accu-Chek Softclix Lancets lancets Use as instructed 04/20/21  Yes Katsadouros, Vasilios, MD  acetaminophen (TYLENOL) 325 MG tablet Take 650 mg by mouth every 6 (six) hours as needed.   Yes [provider]  Blood Glucose Monitoring Suppl (ACCU-CHEK GUIDE) w/Device KIT Use to test blood sugar in the morning, Dx E11.9 01/11/21  Yes Iona Beard, MD  glucose blood (ACCU-CHEK GUIDE) test strip USE AS DIRECTED 02/02/22  Yes Timothy Lasso, MD  albuterol (PROAIR HFA) 108 (90 Base) MCG/ACT inhaler Inhale 1-2 puffs into the lungs every 6 (six) hours as needed for wheezing or shortness of breath. Patient not taking: Reported on 05/02/2022 06/29/20   Modena Nunnery D, DO  IBUPROFEN PO Take by mouth as needed. Patient not taking: Reported on 05/02/2022    [provider]  Insulin Pen Needle (BD PEN NEEDLE NANO 2ND GEN) 32G X 4 MM MISC 1 Package by Does not apply route 2 (two) times daily. Patient not taking: Reported on 05/02/2022  03/30/22   Laqueta Linden, MD  lisinopril (ZESTRIL) 10 MG tablet Take 2 tablets (20 mg total) by mouth daily. Patient not taking: Reported on 05/02/2022 08/03/21   Axel Filler, MD  rosuvastatin (CRESTOR) 20 MG tablet Take 1 tablet (20 mg total) by mouth daily. 08/04/21 05/01/22  Aldine Contes, MD  Semaglutide,0.25 or 0.5MG/DOS, (OZEMPIC, 0.25 OR 0.5 MG/DOSE,) 2 MG/3ML SOPN Inject 0.25 mg into the skin once a week. 03/30/22   Laqueta Linden, MD  Vitamin D, Ergocalciferol, (DRISDOL) 1.25 MG (50000 UNIT) CAPS capsule Take 1 capsule (50,000 Units total) by mouth every 7 (seven) days. 03/30/22    Laqueta Linden, MD    Current Outpatient Medications  Medication Sig Dispense Refill   Accu-Chek Softclix Lancets lancets Use as instructed 100 each 12   acetaminophen (TYLENOL) 325 MG tablet Take 650 mg by mouth every 6 (six) hours as needed.     Blood Glucose Monitoring Suppl (ACCU-CHEK GUIDE) w/Device KIT Use to test blood sugar in the morning, Dx E11.9 1 kit 0   glucose blood (ACCU-CHEK GUIDE) test strip USE AS DIRECTED 100 each 0   albuterol (PROAIR HFA) 108 (90 Base) MCG/ACT inhaler Inhale 1-2 puffs into the lungs every 6 (six) hours as needed for wheezing or shortness of breath. (Patient not taking: Reported on 05/02/2022) 8 g 2   IBUPROFEN PO Take by mouth as needed. (Patient not taking: Reported on 05/02/2022)     Insulin Pen Needle (BD PEN NEEDLE NANO 2ND GEN) 32G X 4 MM MISC 1 Package by Does not apply route 2 (two) times daily. (Patient not taking: Reported on 05/02/2022) 100 each 0   lisinopril (ZESTRIL) 10 MG tablet Take 2 tablets (20 mg total) by mouth daily. (Patient not taking: Reported on 05/02/2022) 90 tablet 3   rosuvastatin (CRESTOR) 20 MG tablet Take 1 tablet (20 mg total) by mouth daily. 90 tablet 2   Semaglutide,0.25 or 0.5MG/DOS, (OZEMPIC, 0.25 OR 0.5 MG/DOSE,) 2 MG/3ML SOPN Inject 0.25 mg into the skin once a week. 3 mL 0   Vitamin D, Ergocalciferol, (DRISDOL) 1.25 MG (50000 UNIT) CAPS capsule Take 1 capsule (50,000 Units total) by mouth every 7 (seven) days. 4 capsule 0   Current Facility-Administered Medications  Medication Dose Route Frequency Provider Last Rate Last Admin   0.9 %  sodium chloride infusion  500 mL Intravenous Once Kebra Lowrimore V, DO        Allergies as of 05/02/2022 - Review Complete 05/02/2022  Allergen Reaction Noted   Cat hair extract Anaphylaxis 11/23/2021   Mushroom extract complex Hives and Itching 02/29/2012    Family History  Problem Relation Age of Onset   High blood pressure Mother    Thyroid disease Mother    Sleep apnea  Mother    Obesity Mother    Cervical cancer Maternal Grandmother    Cancer Other    Breast cancer Neg Hx    Colon cancer Neg Hx    Colon polyps Neg Hx    Crohn's disease Neg Hx    Esophageal cancer Neg Hx    Rectal cancer Neg Hx    Stomach cancer Neg Hx     Social History   Socioeconomic History   Marital status: Single    Spouse name: Not on file   Number of children: Not on file   Years of education: Not on file   Highest education level: Not on file  Occupational History   Not on file  Tobacco Use   Smoking status: Never    Passive exposure: Current   Smokeless tobacco: Never  Vaping Use   Vaping Use: Never used  Substance and Sexual Activity   Alcohol use: No    Alcohol/week: 0.0 standard drinks of alcohol   Drug use: No   Sexual activity: Not on file  Other Topics Concern   Not on file  Social History Narrative   Not on file   Social Determinants of Health   Financial Resource Strain: Not on file  Food Insecurity: Not on file  Transportation Needs: Not on file  Physical Activity: Not on file  Stress: Not on file  Social Connections: Not on file  Intimate Partner Violence: Not on file    Physical Exam: Vital signs in last 24 hours: _0  108/70   Pulse 89   Temp 97.8 F (36.6 C) (Skin)   Ht 5' 2" (1.575 m)   Wt 263 lb (119.3 kg)   SpO2 97%   BMI 48.10 kg/m  GEN: NAD EYE: Sclerae anicteric ENT: MMM CV: Non-tachycardic Pulm: CTA b/l GI: Soft, NT/ND NEURO:  Alert & Oriented x 3   Gerrit Heck, DO [facility]   05/02/2022 8:48 AM

## 2022-05-02 NOTE — Patient Instructions (Signed)
   Handout on diverticulosis given to you today     YOU HAD AN ENDOSCOPIC PROCEDURE TODAY AT THE [facility]:   Refer to the procedure report that was given to you for any specific questions about what was found during the examination.  If the procedure report does not answer your questions, please call your gastroenterologist to clarify.  If you requested that your care partner not be given the details of your procedure findings, then the procedure report has been included in a sealed envelope for you to review at your convenience later.  YOU SHOULD EXPECT: Some feelings of bloating in the abdomen. Passage of more gas than usual.  Walking can help get rid of the air that was put into your GI tract during the procedure and reduce the bloating. If you had a lower endoscopy (such as a colonoscopy or flexible sigmoidoscopy) you may notice spotting of blood in your stool or on the toilet paper. If you underwent a bowel prep for your procedure, you may not have a normal bowel movement for a few days.  Please Note:  You might notice some irritation and congestion in your nose or some drainage.  This is from the oxygen used during your procedure.  There is no need for concern and it should clear up in a day or so.  SYMPTOMS TO REPORT IMMEDIATELY:  Following lower endoscopy (colonoscopy or flexible sigmoidoscopy):  Excessive amounts of blood in the stool  Significant tenderness or worsening of abdominal pains  Swelling of the abdomen that is new, acute  Fever of 100F or higher  For urgent or emergent issues, a gastroenterologist can be reached at any hour by calling [phone]. Do not use MyChart messaging for urgent concerns.    DIET:  We do recommend a small meal at first, but then you may proceed to your regular diet.  Drink plenty of fluids but you should avoid alcoholic beverages for 24 hours.  ACTIVITY:  You should plan to take it easy for the rest of today and you should  NOT DRIVE or use heavy machinery until tomorrow (because of the sedation medicines used during the test).    FOLLOW UP: Our staff will call the number listed on your records the next business day following your procedure.  We will call around 7:15- 8:00 am to check on you and address any questions or concerns that you may have regarding the information given to you following your procedure. If we do not reach you, we will leave a message.  If you develop any symptoms (ie: fever, flu-like symptoms, shortness of breath, cough etc.) before then, please call [phone].  If you test positive for Covid 19 in the 2 weeks post procedure, please call and report this information to us.    If any biopsies were taken you will be contacted by phone or by letter within the next 1-3 weeks.  Please call us at [phone] if you have not heard about the biopsies in 3 weeks.    SIGNATURES/CONFIDENTIALITY: You and/or your care partner have signed paperwork which will be entered into your electronic medical record.  These signatures attest to the fact that that the information above on your After Visit Summary has been reviewed and is understood.  Full responsibility of the confidentiality of this discharge information lies with you and/or your care-partner.  

## 2022-05-02 NOTE — Progress Notes (Signed)
A and O x3. Report to RN. Tolerated MAC anesthesia well. 

## 2022-05-02 NOTE — Op Note (Signed)
[facility] Patient Name: [name] [name] Procedure Date: 05/02/2022 8:54 AM MRN: [id number] Endoscopist: Gerrit Heck , MD Age: 51 Referring MD:  Date of Birth: [birth year] Gender: Female Account #: [id number] Procedure:                Colonoscopy Indications:              Screening for colorectal malignant neoplasm, This                            is the patient's first colonoscopy Medicines:                Monitored Anesthesia Care Procedure:                Pre-Anesthesia Assessment:                           - Prior to the procedure, a History and Physical                            was performed, and patient medications and                            allergies were reviewed. The patient's tolerance of                            previous anesthesia was also reviewed. The risks                            and benefits of the procedure and the sedation                            options and risks were discussed with the patient.                            All questions were answered, and informed consent                            was obtained. Prior Anticoagulants: The patient has                            taken no previous anticoagulant or antiplatelet                            agents. ASA Grade Assessment: III - A patient with                            severe systemic disease. After reviewing the risks                            and benefits, the patient was deemed in                            satisfactory condition to undergo the procedure.  After obtaining informed consent, the colonoscope                            was passed under direct vision. Throughout the                            procedure, the patient's blood pressure, pulse, and                            oxygen saturations were monitored continuously. The                            CF [id number] #[id number] was introduced through the anus                            and advanced to the  the terminal ileum. The                            colonoscopy was performed without difficulty. The                            patient tolerated the procedure well. The quality                            of the bowel preparation was good. The terminal                            ileum, ileocecal valve, appendiceal orifice, and                            rectum were photographed. Scope In: 9:05:01 AM Scope Out: 9:18:55 AM Scope Withdrawal Time: 0 hours 9 minutes 50 seconds  Total Procedure Duration: 0 hours 13 minutes 54 seconds  Findings:                 The perianal and digital rectal examinations were                            normal.                           A few small-mouthed diverticula were found in the                            sigmoid colon, descending colon and ascending colon.                           The exam was otherwise normal throughout the                            remainder of the colon.                           The retroflexed view of the distal rectum and anal  verge was normal and showed no anal or rectal                            abnormalities.                           The terminal ileum appeared normal. Complications:            No immediate complications. Estimated Blood Loss:     Estimated blood loss: none. Impression:               - Diverticulosis in the sigmoid colon, in the                            descending colon and in the ascending colon.                           - The distal rectum and anal verge are normal on                            retroflexion view.                           - The examined portion of the ileum was normal.                           - No specimens collected. Recommendation:           - Patient has a contact number available for                            emergencies. The signs and symptoms of potential                            delayed complications were discussed with the                             patient. Return to normal activities tomorrow.                            Written discharge instructions were provided to the                            patient.                           - Resume previous diet.                           - Continue present medications.                           - Repeat colonoscopy in 10 years for screening                            purposes.                           -  Return to GI office PRN. Gerrit Heck, MD 05/02/2022 9:26:04 AM

## 2022-05-02 NOTE — Progress Notes (Signed)
Pt's states no medical or surgical changes since previsit or office visit. VS assessed by AS 

## 2022-05-03 ENCOUNTER — Encounter (INDEPENDENT_AMBULATORY_CARE_PROVIDER_SITE_OTHER): Payer: Self-pay | Admitting: Family Medicine

## 2022-05-03 ENCOUNTER — Ambulatory Visit (INDEPENDENT_AMBULATORY_CARE_PROVIDER_SITE_OTHER): Payer: Medicaid Other | Admitting: Family Medicine

## 2022-05-03 ENCOUNTER — Encounter (INDEPENDENT_AMBULATORY_CARE_PROVIDER_SITE_OTHER): Payer: Self-pay

## 2022-05-03 VITALS — BP 110/80 | HR 84 | Temp 98.2°F | Ht 62.0 in | Wt 262.0 lb

## 2022-05-03 DIAGNOSIS — Z7985 Long-term (current) use of injectable non-insulin antidiabetic drugs: Secondary | ICD-10-CM

## 2022-05-03 DIAGNOSIS — E1165 Type 2 diabetes mellitus with hyperglycemia: Secondary | ICD-10-CM | POA: Diagnosis not present

## 2022-05-03 DIAGNOSIS — Z6841 Body Mass Index (BMI) 40.0 and over, adult: Secondary | ICD-10-CM

## 2022-05-03 DIAGNOSIS — E669 Obesity, unspecified: Secondary | ICD-10-CM | POA: Diagnosis not present

## 2022-05-03 DIAGNOSIS — E559 Vitamin D deficiency, unspecified: Secondary | ICD-10-CM

## 2022-05-03 MED ORDER — VITAMIN D (ERGOCALCIFEROL) 1.25 MG (50000 UNIT) PO CAPS: 50000.0000 [IU] | ORAL_CAPSULE | ORAL | 0 refills | Status: DC

## 2022-05-03 MED ORDER — OZEMPIC (0.25 OR 0.5 MG/DOSE) 2 MG/3ML ~~LOC~~ SOPN: 0.2500 mg | PEN_INJECTOR | SUBCUTANEOUS | 0 refills | Status: DC

## 2022-05-08 ENCOUNTER — Telehealth: Payer: Self-pay | Admitting: *Deleted

## 2022-05-08 NOTE — Telephone Encounter (Signed)
Attempted f/u phone call. No answer. Left message. °

## 2022-05-09 NOTE — Progress Notes (Signed)
Chief Complaint:   OBESITY [name] [name] is here to discuss her progress with her obesity treatment plan along with follow-up of her obesity related diagnoses. [name] [name] is on the Category 3 Plan and states she is following her eating plan approximately 40% of the time. [name] [name] states she is exercising 0 minutes 0 times per week.  Today's visit was #: 3 Starting weight: 267 lbs Starting date: 03/16/2022 Today's weight: 262 lbs Today's date: 05/03/2022 Total lbs lost to date: 5 lbs Total lbs lost since last in-office visit: 5  Interim History: [name] [name] notes some appetite changes--gravitating toward more fruit. Colonoscopy done yesterday--all normal. Eating more fruit recently particularly watermelon. Has gotten in a bit of protein. Does not like a lot of hot food when it's hot out.  Subjective:   1. Type 2 diabetes mellitus with hyperglycemia, without long-term current use of insulin (HCC) [name] [name] is currently on Ozempic 0.25 mg. Denies any side effects.  2. Vitamin D deficiency [name] [name] is currently taking prescription Vit D 50,000 IU once a week. Denies any nausea, vomiting or muscle weakness. She notes fatigue.   Assessment/Plan:   1. Type 2 diabetes mellitus with hyperglycemia, without long-term current use of insulin (HCC) We will refill Ozempic 0.25 mg SubQ once weekly for 1 month with 0 refills.  -Refill Semaglutide,0.25 or 0.5MG /DOS, (OZEMPIC, 0.25 OR 0.5 MG/DOSE,) 2 MG/3ML SOPN; Inject 0.25 mg into the skin once a week.  Dispense: 3 mL; Refill: 0  2. Vitamin D deficiency We will refill Vit D 50,000 IU once a week for 1 month with 0 refills.  -Refill Vitamin D, Ergocalciferol, (DRISDOL) 1.25 MG (50000 UNIT) CAPS capsule; Take 1 capsule (50,000 Units total) by mouth every 7 (seven) days.  Dispense: 4 capsule; Refill: 0  3. Obesity with current BMI of 48.0 [name] [name] is currently in the action stage of change. As such, her goal is to continue with weight loss efforts. She has  agreed to the Category 3 Plan.   [name] [name] wants to work on getting breakfast in frequently and food in at dinner with some nutritional value.   Exercise goals: All adults should avoid inactivity. Some physical activity is better than none, and adults who participate in any amount of physical activity gain some health benefits.  Behavioral modification strategies: increasing lean protein intake, meal planning and cooking strategies, keeping healthy foods in the home, and planning for success.  [name] [name] has agreed to follow-up with our clinic in 3 weeks. She was informed of the importance of frequent follow-up visits to maximize her success with intensive lifestyle modifications for her multiple health conditions.   Objective:   Blood pressure 110/80, pulse 84, temperature 98.2 F (36.8 C), height 5\' 2"  (1.575 m), weight 262 lb (118.8 kg), SpO2 98 %. Body mass index is 47.92 kg/m.  General: Cooperative, alert, well developed, in no acute distress. HEENT: Conjunctivae and lids unremarkable. Cardiovascular: Regular rhythm.  Lungs: Normal work of breathing. Neurologic: No focal deficits.   Lab Results  Component Value Date   CREATININE 0.81 03/20/2022   BUN 8 03/20/2022   NA 140 03/20/2022   K 4.4 03/20/2022   CL 104 03/20/2022   CO2 22 03/20/2022   Lab Results  Component Value Date   ALT 18 03/20/2022   AST 18 03/20/2022   ALKPHOS 94 03/20/2022   BILITOT 0.3 03/20/2022   Lab Results  Component Value Date   HGBA1C 6.8 (H) 03/20/2022   HGBA1C 6.3 (A) 11/23/2021   HGBA1C 6.7 (  A) 08/03/2021   HGBA1C 7.0 (A) 12/09/2020   HGBA1C 6.7 (A) 07/07/2020   Lab Results  Component Value Date   INSULIN 20.0 03/20/2022   Lab Results  Component Value Date   TSH 2.620 03/20/2022   Lab Results  Component Value Date   CHOL 168 03/20/2022   HDL 57 03/20/2022   LDLCALC 100 (H) 03/20/2022   TRIG 57 03/20/2022   CHOLHDL 2.9 08/03/2021   Lab Results  Component Value Date   VD25OH  22.4 (L) 03/20/2022   Lab Results  Component Value Date   WBC 7.3 03/20/2022   HGB 12.5 03/20/2022   HCT 40.3 03/20/2022   MCV 83 03/20/2022   PLT 390 03/20/2022   Lab Results  Component Value Date   IRON 36 06/27/2016   TIBC 294 06/27/2016   FERRITIN 140 12/09/2020   Attestation Statements:   Reviewed by clinician on day of visit: allergies, medications, problem list, medical history, surgical history, family history, social history, and previous encounter notes.  I, Fortino Sic, RMA am acting as transcriptionist for Reuben Likes, MD.  I have reviewed the above documentation for accuracy and completeness, and I agree with the above. - Reuben Likes, MD

## 2022-05-11 ENCOUNTER — Other Ambulatory Visit (INDEPENDENT_AMBULATORY_CARE_PROVIDER_SITE_OTHER): Payer: Self-pay | Admitting: Family Medicine

## 2022-05-11 DIAGNOSIS — E1165 Type 2 diabetes mellitus with hyperglycemia: Secondary | ICD-10-CM

## 2022-05-15 ENCOUNTER — Encounter: Payer: Self-pay | Admitting: *Deleted

## 2022-05-16 ENCOUNTER — Encounter: Payer: Medicaid Other | Admitting: Gastroenterology

## 2022-05-16 ENCOUNTER — Ambulatory Visit: Payer: Medicaid Other | Admitting: Neurology

## 2022-05-16 ENCOUNTER — Encounter: Payer: Self-pay | Admitting: Neurology

## 2022-05-16 VITALS — BP 121/79 | HR 80 | Ht 62.0 in | Wt 268.4 lb

## 2022-05-16 DIAGNOSIS — Z789 Other specified health status: Secondary | ICD-10-CM | POA: Diagnosis not present

## 2022-05-16 DIAGNOSIS — G4733 Obstructive sleep apnea (adult) (pediatric): Secondary | ICD-10-CM

## 2022-05-16 DIAGNOSIS — R519 Headache, unspecified: Secondary | ICD-10-CM | POA: Diagnosis not present

## 2022-05-16 DIAGNOSIS — Z820 Family history of epilepsy and other diseases of the nervous system: Secondary | ICD-10-CM | POA: Diagnosis not present

## 2022-05-16 DIAGNOSIS — R351 Nocturia: Secondary | ICD-10-CM

## 2022-05-16 NOTE — Patient Instructions (Signed)
Thank you for choosing [facility] for your sleep related care! It was nice to meet you today!   Here is what we discussed today:    It is possible that your recurrent headaches are actually from caffeine withdrawal.   Based on your symptoms and your exam I believe you are still at risk for obstructive sleep apnea (aka OSA). We should proceed with a sleep study to determine whether you do or do not have OSA and how severe it is. Even, if you have mild OSA, I may want you to consider ongoing treatment with a CPAP or an autoPAP machine, as treatment of even borderline or mild sleep apnea can result and improvement of symptoms such as sleep disruption, daytime sleepiness, nighttime bathroom breaks, restless leg symptoms, improvement of headache syndromes, even improved mood disorder.   As explained, an attended sleep study (meaning you get to stay overnight in the sleep lab), lets [place] monitor sleep-related behaviors such as sleep talking and leg movements in sleep, in addition to monitoring for sleep apnea.  A home sleep test is a screening tool for sleep apnea diagnosis only, but unfortunately, does not help with any other sleep-related diagnoses.  Please remember, the long-term risks and ramifications of untreated moderate to severe obstructive sleep apnea may include (but are not limited to): increased risk for cardiovascular disease, including congestive heart failure, stroke, difficult to control hypertension, treatment resistant obesity, arrhythmias, especially irregular heartbeat commonly known as A. Fib. (atrial fibrillation); even type 2 diabetes has been linked to untreated OSA.   Other correlations that untreated obstructive sleep apnea include macular edema which is swelling of the retina in the eyes, droopy eyelid syndrome, and elevated hemoglobin and hematocrit levels (often referred to as polycythemia).  Sleep apnea can cause disruption of sleep and sleep deprivation in  most cases, which, in turn, can cause recurrent headaches, problems with memory, mood, concentration, focus, and vigilance. Most people with untreated sleep apnea report excessive daytime sleepiness, which can affect their ability to drive. Please do not drive or use heavy equipment or machinery, if you feel sleepy! Patients with sleep apnea can also develop difficulty initiating and maintaining sleep (aka insomnia).   Having sleep apnea may increase your risk for other sleep disorders, including involuntary behaviors sleep such as sleep terrors, sleep talking, sleepwalking.    Having sleep apnea can also increase your risk for restless leg syndrome and leg movements at night.   Please note that untreated obstructive sleep apnea may carry additional perioperative morbidity. Patients with significant obstructive sleep apnea (typically, in the moderate to severe degree) should receive, if possible, perioperative PAP (positive airway pressure) therapy and the surgeons and particularly the anesthesiologists should be informed of the diagnosis and the severity of the sleep disordered breathing.   We will call you or email you through MyChart with regards to your test results and plan a follow-up in sleep clinic accordingly. Most likely, you will hear from one of our nurses.   Our sleep lab administrative assistant will call you to schedule your sleep study and give you further instructions, regarding the check in process for the sleep study, arrival time, what to bring, when you can expect to leave after the study, etc., and to answer any other logistical questions you may have. If you don't hear back from her by about 2 weeks from now, please feel free to call her direct line at [phone] or you can call our general clinic number, or email  [place] through My Chart.

## 2022-05-16 NOTE — Progress Notes (Signed)
Subjective:    Patient ID: [name] [name] is a 51 y.o. female.  HPI    Star Age, MD, PhD [facility] [address], [address] [address] [place], [place] [address]  Dear Dr. Alfonse Spruce,  I saw your patient, [name] [name], upon your kind request in my sleep clinic today for initial consultation of her sleep disorder, in particular, evaluation of her prior diagnosis of obstructive sleep apnea.  The patient is unaccompanied today.  As you know, [name] [name] is a 51 year old right-handed woman with an underlying medical history of diabetes, hypertension, hyperlipidemia, migraine headaches, arthritis, anemia, asthma, low back pain, and severe obesity with a BMI of over 45, who was previously diagnosed with obstructive sleep apnea and placed on CPAP therapy.  I reviewed your office note from 04/03/2022.  She had sleep testing on 08/07/2016.  I was able to review her sleep study results.  She had a split-night sleep study and was laying on hospital sleep lab.  Baseline AHI was 15.8/h.  O2 nadir was 86%.  She was titrated from 5 cm to 10 cm at the time.  She was set up on CPAP of 10 cm in January 2018.  I was able to review her latest compliance data for the past 90 days from 02/15/2022 through 05/15/2022, during which time she used her machine 36 out of 90 days with percent use days greater than 4 hours of 28%, indicating suboptimal compliance, average AHI 1.4/h, leak acceptable with the 95th percentile at 9.8 L/min on a pressure of 10 cm with EPR of 2.  Her DME company is [employer].  Her Epworth sleepiness score is 6 out of 24, fatigue severity score is 24 out of 63.  Her mom has sleep apnea and has a CPAP machine.  Patient has been working on weight loss and has been going to the weight management clinic for the past nearly 3 months.  She has been on Ozempic for about 2 months.  She has drastically reduced her caffeine intake from up to 2 2-liter bottles of soda per day to down to 1-1/2  cans of soda per day.  Interestingly, she has had increase in headaches in the past month and a half it may correlate with her drastic reduction of her caffeine.  She has not been consistent with her CPAP as she felt the headaches were coming from her CPAP machine.  When she first got on her machine she did notice benefit from the treatment.  Her weight was indeed higher in the past when she was first diagnosed with sleep apnea.  Bedtime is generally between 9:30 PM and 10 PM.  Rise time is between 5 and 5:30 AM.  She works as a International aid/development worker, lives with her 1 year old son.  She has a boyfriend who stays over from time to time.  She is not a smoker and does not currently drink any alcohol.  She has had trouble with her mask, it does not fit as well.  She has not been in touch with her DME company which is [employer] about this.  She has nocturia about 2-3 times per average night.  She has a TV in her bedroom but does not have it on all night.  They have no pets in the household.  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   Over weight    Pneumonia 2006   Post viral syndrome 07/17/2019   Renal insufficiency 06/18/2019   Sleep apnea    CPAP   SOB (shortness of breath)    Stress at home    "and at work"    Her Past Surgical History Is Significant For: Past Surgical History:  Procedure Laterality Date   BREAST BIOPSY Left 07/29/2013   CESAREAN SECTION  08/2008   CHOLECYSTECTOMY  03/02/2012    Procedure: LAPAROSCOPIC CHOLECYSTECTOMY;  Surgeon: Gwenyth Ober, MD;  Location: [place];  Service: General;;   KNEE ARTHROSCOPY WITH MENISCAL REPAIR Right 06/25/2018   Procedure: RIGHT KNEE ARTHROSCOPY WITH MENISCAL ROOT REPAIR;  Surgeon: Meredith Pel, MD;  Location: [place];  Service: Orthopedics;  Laterality: Right;   KNEE ARTHROSCOPY WITH MENISCAL REPAIR Left 03/09/2020   Procedure: LEFT KNEE ARTHROSCOPY, MEDIAL MENISCAL ROOT REPAIR;  Surgeon: Meredith Pel, MD;  Location: [place];  Service: Orthopedics;  Laterality: Left;   WISDOM TOOTH EXTRACTION      Her Family History Is Significant For: Family History  Problem Relation Age of Onset   High blood pressure Mother    Thyroid disease Mother    Sleep apnea Mother    Obesity Mother    Cervical cancer Maternal Grandmother    Cancer Other    Breast cancer Neg Hx    Colon cancer Neg Hx    Colon polyps Neg Hx    Crohn's disease Neg Hx    Esophageal cancer Neg Hx    Rectal cancer Neg Hx    Stomach cancer Neg Hx     Her Social History Is Significant For: Social History   Socioeconomic History   Marital status: Single    Spouse name: Not on file   Number of children: Not on file   Years of education: Not on file   Highest education level: Not on file  Occupational History   Not on file  Tobacco Use   Smoking status: Never    Passive exposure: Current   Smokeless tobacco: Never  Vaping Use   Vaping Use: Never used  Substance and Sexual Activity   Alcohol use: No    Alcohol/week: 0.0 standard drinks of alcohol   Drug use: No   Sexual activity: Not on file  Other Topics Concern   Not on file  Social History Narrative   Caffiene, 1 cup coffee in am, soda's - 1-2 can daily.   Education :  11th grade.   Working; Teacher, early years/pre.    Social Determinants of Health   Financial Resource Strain: Not on file  Food Insecurity: Not on file  Transportation Needs: Not on file  Physical Activity: Not on file  Stress: Not  on file  Social Connections: Not on file    Her Allergies Are:  Allergies  Allergen Reactions   Cat Hair Extract Anaphylaxis   Clorox Nasal Antiseptic [Povidone-Iodine]     Chlorox rash.   Mushroom Extract  Complex Hives and Itching    Mushroom as a whole the pt is allergic to.  :   Her Current Medications Are:  Outpatient Encounter Medications as of 05/16/2022  Medication Sig   Accu-Chek Softclix Lancets lancets Use as instructed   acetaminophen (TYLENOL) 325 MG tablet Take 650 mg by mouth every 6 (six) hours as needed.   albuterol (PROAIR HFA) 108 (90 Base) MCG/ACT inhaler Inhale 1-2 puffs into the lungs every 6 (six) hours as needed for wheezing or shortness of breath.   Blood Glucose Monitoring Suppl (ACCU-CHEK GUIDE) w/Device KIT Use to test blood sugar in the morning, Dx E11.9   glucose blood (ACCU-CHEK GUIDE) test strip USE AS DIRECTED   IBUPROFEN PO Take by mouth as needed.   Insulin Pen Needle (BD PEN NEEDLE NANO 2ND GEN) 32G X 4 MM MISC 1 Package by Does not apply route 2 (two) times daily.   lisinopril (ZESTRIL) 10 MG tablet Take 2 tablets (20 mg total) by mouth daily.   Semaglutide,0.25 or 0.5MG/DOS, (OZEMPIC, 0.25 OR 0.5 MG/DOSE,) 2 MG/3ML SOPN Inject 0.25 mg into the skin once a week.   Vitamin D, Ergocalciferol, (DRISDOL) 1.25 MG (50000 UNIT) CAPS capsule Take 1 capsule (50,000 Units total) by mouth every 7 (seven) days.   rosuvastatin (CRESTOR) 20 MG tablet Take 1 tablet (20 mg total) by mouth daily.   No facility-administered encounter medications on file as of 05/16/2022.  :   Review of Systems:  Out of a complete 14 point review of systems, all are reviewed and negative with the exception of these symptoms as listed below:  Review of Systems  Neurological:        Wakening headaches. Had sleep study in [place], [place] 2016-2017 approx.  Using resmed machine). DME ADAPT.  ESS 6, FSS 24.     Objective:  Neurological Exam  Physical Exam Physical Examination:    Vitals:   05/16/22 1216  BP: 121/79  Pulse: 80    General Examination: The patient is a very pleasant 51 y.o. female in no acute distress. She appears well-developed and well-nourished and well groomed.   HEENT: Normocephalic, atraumatic, pupils are equal, round and reactive to light, extraocular tracking is good without limitation to gaze excursion or nystagmus noted. Hearing is grossly intact. Face is symmetric with normal facial animation. Speech is clear with no dysarthria noted. There is no hypophonia. There is no lip, neck/head, jaw or voice tremor. Neck is supple with full range of passive and active motion. There are no carotid bruits on auscultation. Oropharynx exam reveals: mild mouth dryness, adequate dental hygiene and moderate airway crowding, due to small airway entry, Mallampati class II, smaller tonsils, elongated uvula.  Tongue protrudes centrally and palate elevates symmetrically, neck circumference of 16-1/2 inches.  Minimal overbite noted.  Chest: Clear to auscultation without wheezing, rhonchi or crackles noted.  Heart: S1+S2+0, regular and normal without murmurs, rubs or gallops noted.   Abdomen: Soft, non-tender and non-distended.  Extremities: There is no pitting edema in the distal lower extremities bilaterally.   Skin: Warm and dry without trophic changes noted.   Musculoskeletal: exam reveals no obvious joint deformities, tenderness or joint swelling or erythema.   Neurologically:  Mental status: The patient is awake, alert and oriented in all 4 spheres. Her immediate and remote memory, attention, language skills and fund of knowledge are appropriate. There is no evidence of aphasia, agnosia, apraxia or anomia. Speech is clear with normal prosody and enunciation. Thought process is linear.  Mood is normal and affect is normal.  Cranial nerves II - XII are as described above under HEENT exam.  Motor exam: Normal bulk, strength and tone is noted. There is no  obvious tremor.  Fine motor skills and coordination: grossly intact.  Cerebellar testing: No dysmetria or intention tremor. There is no truncal or gait ataxia.  Sensory exam: intact to light touch in the upper and lower extremities.  Gait, station and balance: She stands easily. No veering to one side is noted. No leaning to one side is noted. Posture is age-appropriate and stance is narrow based. Gait shows normal stride length and normal pace. No problems turning are noted.   Assessment and Plan:  In summary, [name] [name] is a very pleasant 51 y.o.-year old female with an underlying medical history of diabetes, hypertension, hyperlipidemia, migraine headaches, arthritis, anemia, asthma, low back pain, and severe obesity with a BMI of over 24, who presents for evaluation of her prior diagnosis of obstructive sleep apnea.  She was diagnosed with moderate obstructive sleep apnea in the fall 2017 and has been on CPAP of 10 cm since January 2018.  She is not very consistent with her treatment currently, felt like she was having headaches from using CPAP.  He has had difficulty with her mask fit as well.  I advised her that in it is very possible that her headaches are actually caffeine withdrawal headache since she has drastically reduced her caffeine intake in the past month or 2.  She is furthermore advised that typically headaches improved with CPAP usage.  I will write for new supplies and mask fit through her DME provider.  We will proceed with reevaluation of her sleep apnea with a sleep study as it has been over 5 years and she has had weight fluctuation.  She is agreeable to our plan.  I had a long chat with the patient about my findings and the diagnosis of sleep apnea, particularly OSA, its prognosis and treatment options. We talked about medical/conservative treatments, surgical interventions and non-pharmacological approaches for symptom control. I explained, in particular, the risks and  ramifications of untreated moderate to severe OSA, especially with respect to developing cardiovascular disease down the road, including congestive heart failure (CHF), difficult to treat hypertension, cardiac arrhythmias (particularly A-fib), neurovascular complications including TIA, stroke and dementia. Even type 2 diabetes has, in part, been linked to untreated OSA. Symptoms of untreated OSA may include (but may not be limited to) daytime sleepiness, nocturia (i.e. frequent nighttime urination), memory problems, mood irritability and suboptimally controlled or worsening mood disorder such as depression and/or anxiety, lack of energy, lack of motivation, physical discomfort, as well as recurrent headaches, especially morning or nocturnal headaches. We talked about the importance of maintaining a healthy lifestyle and striving for healthy weight.  In addition, we talked about the importance of striving for and maintaining good sleep hygiene. I recommended the following at this time: sleep study.  I outlined the differences between a laboratory attended sleep study which is considered more comprehensive and accurate over the option of a home sleep test (HST); the latter may lead to underestimation of sleep disordered breathing in some instances and does not help with diagnosing upper airway resistance syndrome and is not accurate enough to diagnose primary central sleep apnea typically. I explained the different sleep test procedures to the patient in detail and also outlined possible surgical and non-surgical treatment options of OSA, including the use of a pressure airway pressure (PAP)  device (ie CPAP, AutoPAP/APAP or BiPAP in certain circumstances), a custom-made dental device (aka oral appliance, which would require a referral to a specialist dentist or orthodontist typically, and is generally speaking not considered a good choice for patients with full dentures or edentulous state), upper airway surgical  options, such as traditional UPPP (which is not considered a first-line treatment) or the Inspire device (hypoglossal nerve stimulator, which would involve a referral for consultation with an ENT surgeon, after careful selection, following inclusion criteria). I explained the PAP treatment option to the patient in detail, as this is generally considered first-line treatment.  The patient indicated that she would be willing to try PAP therapy again, if the need arises. I explained the importance of being compliant with PAP treatment, not only for insurance purposes but primarily to improve patient's symptoms symptoms, and for the patient's long term health benefit, including to reduce Her cardiovascular risks longer-term.    We will pick up our discussion about the next steps and treatment options after testing.  We will keep her posted as to the test results by phone call and/or MyChart messaging where possible.  We will plan to follow-up in sleep clinic accordingly as well.  I answered all her questions today and the patient was in agreement.   I encouraged her to call with any interim questions, concerns, problems or updates or email [place] through [facility].  Generally speaking, sleep test authorizations may take up to 2 weeks, sometimes less, sometimes longer, the patient is encouraged to get in touch with [place] if they do not hear back from the sleep lab staff directly within the next 2 weeks.  Thank you very much for allowing me to participate in the care of this nice patient. If I can be of any further assistance to you please do not hesitate to call me at [phone].  Sincerely,   Star Age, MD, PhD

## 2022-05-22 NOTE — Progress Notes (Signed)
Hepler, Melanie Karma Ganja, RN got it      Previous Messages    ----- Message -----  From: Guy Begin, RN  Sent: 05/17/2022  12:50 PM EDT  To: Mellody Dance; [facility]   Good afternoon,   New order in epic for pt. New mask and PAP supplies   [name]  Female, 51 y.o., 03/01/1971  MRN:  [id number]    [facility]

## 2022-05-24 ENCOUNTER — Ambulatory Visit (INDEPENDENT_AMBULATORY_CARE_PROVIDER_SITE_OTHER): Payer: Medicaid Other | Admitting: Family Medicine

## 2022-05-25 ENCOUNTER — Encounter: Payer: Medicaid Other | Admitting: Internal Medicine

## 2022-05-26 ENCOUNTER — Encounter: Payer: Self-pay | Admitting: Neurology

## 2022-05-26 DIAGNOSIS — Z419 Encounter for procedure for purposes other than remedying health state, unspecified: Secondary | ICD-10-CM | POA: Diagnosis not present

## 2022-05-30 ENCOUNTER — Telehealth: Payer: Self-pay | Admitting: Neurology

## 2022-05-30 DIAGNOSIS — G4733 Obstructive sleep apnea (adult) (pediatric): Secondary | ICD-10-CM

## 2022-05-30 NOTE — Telephone Encounter (Signed)
[employer] pending uploaded notes on the portal  

## 2022-06-02 ENCOUNTER — Other Ambulatory Visit (INDEPENDENT_AMBULATORY_CARE_PROVIDER_SITE_OTHER): Payer: Self-pay | Admitting: Family Medicine

## 2022-06-02 DIAGNOSIS — E559 Vitamin D deficiency, unspecified: Secondary | ICD-10-CM

## 2022-06-05 NOTE — Telephone Encounter (Signed)
Checked the status on the portal it is still pending.  

## 2022-06-06 DIAGNOSIS — G4733 Obstructive sleep apnea (adult) (pediatric): Secondary | ICD-10-CM | POA: Diagnosis not present

## 2022-06-06 NOTE — Telephone Encounter (Signed)
[employer] denied the NPSG.  HST- pending faxed notes.

## 2022-06-12 NOTE — Progress Notes (Signed)
TeleHealth Visit:  This visit was completed with telemedicine (audio/video) technology. [name] [name] has verbally consented to this TeleHealth visit. The patient is located at home, the provider is located at home. The participants in this visit include the listed provider and patient. The visit was conducted today via MyChart video.  OBESITY [name] [name] is here to discuss her progress with her obesity treatment plan along with follow-up of her obesity related diagnoses.   Today's visit was # 4 Starting weight: 267 lbs Starting date: 03/16/2022 Weight at last in office visit: 262 lbs on 05/03/22 Total weight loss: 5 lbs at last in office visit on 05/03/22. Today's reported weight: 250 lbs (unsure if scale is accurate)  Nutrition Plan: the Category 3 Plan.   Current exercise: none  Interim History:  [name] [name] has made a lot of changes since starting our program on 03/16/2022.  She used to drink a 2 L of soda daily.  She is now drinking water and also has about 3 cups of sweet tea per day.  She used to skip breakfast and lunch and now she is eating all 3 meals.  She has reduced her fruit intake and increased her protein intake. Typical day of food: Breakfast-homemade sausage biscuit Lunch-sandwich, fruit, chips Dinner-meat and vegetable-sometimes includes potatoes.  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  Her boyfriend does a lot of the cooking and makes the sweet tea generally so she is unsure how much sugar is in it.  Assessment/Plan:  1. Type II Diabetes HgbA1c is at goal. Last A1c was 6.8 on 03/20/2022. CBGs: Checks occasionally- yesterday 138 after a meal. Episodes of hypoglycemia: no Medication(s): Ozempic 0.25 mg weekly.  Tolerating well. She feels it is helped with appetite and cravings-especially sweet cravings.  Lab Results  Component Value Date   HGBA1C 6.8 (H) 03/20/2022   HGBA1C 6.3 (A) 11/23/2021   HGBA1C 6.7 (A) 08/03/2021    Lab Results  Component Value Date   LDLCALC 100 (H) 03/20/2022   CREATININE 0.81 03/20/2022    Plan: Continue Ozempic 0.25 mg weekly.  2. Vitamin D Deficiency Vitamin D is not at goal of 50.  Last vitamin D was 22.4 on 03/20/2022. She is on weekly prescription Vitamin D 50,000 IU.  Lab Results  Component Value Date   VD25OH 22.4 (L) 03/20/2022    Plan: Refill prescription vitamin D 50,000 IU weekly.   3. Obesity: Current BMI 48.0 [name] [name] is currently in the action stage of change. As such, her goal is to continue with weight loss efforts.  She has agreed to the Category 3 Plan.   1.  Try unsweet tea with artificial sweetener. 2.  Work on keeping extra calories less than 300/day-includes sweet tea, chips at lunch, potato at dinner, etc. 3.  Try baked chips. 4.  Weigh protein.  Exercise goals: No exercise has been prescribed at this time.  Behavioral modification strategies: increasing lean protein intake, decreasing simple carbohydrates, increasing vegetables, decreasing liquid calories, meal planning and cooking strategies, and planning for success.  [name] [name] has agreed to follow-up with our clinic in 4 weeks.   No orders of the defined types were placed in this encounter.   Medications Discontinued During This Encounter  Medication Reason   Vitamin D, Ergocalciferol, (DRISDOL) 1.25 MG (50000 UNIT) CAPS capsule Reorder     Meds ordered this encounter  Medications   Vitamin D, Ergocalciferol, (DRISDOL) 1.25 MG (50000 UNIT) CAPS capsule    Sig:  Take 1 capsule (50,000 Units total) by mouth every 7 (seven) days.    Dispense:  4 capsule    Refill:  0    Order Specific Question:   Supervising Provider    Answer:   Dennard Nip D [[id number]]      Objective:   VITALS: Per patient if applicable, see vitals. GENERAL: Alert and in no acute distress. CARDIOPULMONARY: No increased WOB. Speaking in clear sentences.  PSYCH: Pleasant and cooperative. Speech normal rate and  rhythm. Affect is appropriate. Insight and judgement are appropriate. Attention is focused, linear, and appropriate.  NEURO: Oriented as arrived to appointment on time with no prompting.   Lab Results  Component Value Date   CREATININE 0.81 03/20/2022   BUN 8 03/20/2022   NA 140 03/20/2022   K 4.4 03/20/2022   CL 104 03/20/2022   CO2 22 03/20/2022   Lab Results  Component Value Date   ALT 18 03/20/2022   AST 18 03/20/2022   ALKPHOS 94 03/20/2022   BILITOT 0.3 03/20/2022   Lab Results  Component Value Date   HGBA1C 6.8 (H) 03/20/2022   HGBA1C 6.3 (A) 11/23/2021   HGBA1C 6.7 (A) 08/03/2021   HGBA1C 7.0 (A) 12/09/2020   HGBA1C 6.7 (A) 07/07/2020   Lab Results  Component Value Date   INSULIN 20.0 03/20/2022   Lab Results  Component Value Date   TSH 2.620 03/20/2022   Lab Results  Component Value Date   CHOL 168 03/20/2022   HDL 57 03/20/2022   LDLCALC 100 (H) 03/20/2022   TRIG 57 03/20/2022   CHOLHDL 2.9 08/03/2021   Lab Results  Component Value Date   WBC 7.3 03/20/2022   HGB 12.5 03/20/2022   HCT 40.3 03/20/2022   MCV 83 03/20/2022   PLT 390 03/20/2022   Lab Results  Component Value Date   IRON 36 06/27/2016   TIBC 294 06/27/2016   FERRITIN 140 12/09/2020   Lab Results  Component Value Date   VD25OH 22.4 (L) 03/20/2022    Attestation Statements:   Reviewed by clinician on day of visit: allergies, medications, problem list, medical history, surgical history, family history, social history, and previous encounter notes.

## 2022-06-12 NOTE — Telephone Encounter (Signed)
Checked the status for the HST on the portal it is still pending.

## 2022-06-13 ENCOUNTER — Encounter (INDEPENDENT_AMBULATORY_CARE_PROVIDER_SITE_OTHER): Payer: Self-pay | Admitting: Family Medicine

## 2022-06-13 ENCOUNTER — Telehealth (INDEPENDENT_AMBULATORY_CARE_PROVIDER_SITE_OTHER): Payer: Medicaid Other | Admitting: Family Medicine

## 2022-06-13 DIAGNOSIS — E1169 Type 2 diabetes mellitus with other specified complication: Secondary | ICD-10-CM

## 2022-06-13 DIAGNOSIS — E669 Obesity, unspecified: Secondary | ICD-10-CM | POA: Diagnosis not present

## 2022-06-13 DIAGNOSIS — Z6841 Body Mass Index (BMI) 40.0 and over, adult: Secondary | ICD-10-CM | POA: Diagnosis not present

## 2022-06-13 DIAGNOSIS — Z7985 Long-term (current) use of injectable non-insulin antidiabetic drugs: Secondary | ICD-10-CM

## 2022-06-13 DIAGNOSIS — E559 Vitamin D deficiency, unspecified: Secondary | ICD-10-CM | POA: Diagnosis not present

## 2022-06-13 MED ORDER — VITAMIN D (ERGOCALCIFEROL) 1.25 MG (50000 UNIT) PO CAPS: 50000.0000 [IU] | ORAL_CAPSULE | ORAL | 0 refills | Status: DC

## 2022-06-25 DIAGNOSIS — Z419 Encounter for procedure for purposes other than remedying health state, unspecified: Secondary | ICD-10-CM | POA: Diagnosis not present

## 2022-06-27 NOTE — Telephone Encounter (Signed)
Please advise patient that her insurance has denied a laboratory sleep study as well as a home sleep test.  I am okay ordering her a new AutoPap machine.  Please go over the compliance expectations and need for follow-up within 30 to 90 days.  Please schedule with nurse practitioner for first follow-up after set up with AutoPap.

## 2022-06-27 NOTE — Telephone Encounter (Signed)
LMVM for pt to return call (LMVM for her that medicaid well[employer] denied HST/inlab testing.  Dr. Rexene Alberts went ahead and ordered new autopap machine.  Wanted to confirm DME and compliance appt 30-90 days after starting to use new machine. [facility].

## 2022-06-27 NOTE — Telephone Encounter (Signed)
Medicaid [employer] has denied the HST as well.  "It is not supported when requested to supply new PAP equipment. Repeat sleep testing is not supported  once the source of your problem has been found. "  Insurance is stating to go ahead and order new PAP equipment and supplies.

## 2022-06-27 NOTE — Addendum Note (Signed)
Addended by: Star Age on: 06/27/2022 10:23 AM   Modules accepted: Orders

## 2022-06-28 ENCOUNTER — Encounter: Payer: Self-pay | Admitting: *Deleted

## 2022-06-28 DIAGNOSIS — H5213 Myopia, bilateral: Secondary | ICD-10-CM | POA: Diagnosis not present

## 2022-06-28 DIAGNOSIS — E119 Type 2 diabetes mellitus without complications: Secondary | ICD-10-CM | POA: Diagnosis not present

## 2022-06-28 DIAGNOSIS — H02883 Meibomian gland dysfunction of right eye, unspecified eyelid: Secondary | ICD-10-CM | POA: Diagnosis not present

## 2022-06-28 DIAGNOSIS — H02886 Meibomian gland dysfunction of left eye, unspecified eyelid: Secondary | ICD-10-CM | POA: Diagnosis not present

## 2022-06-28 DIAGNOSIS — H40023 Open angle with borderline findings, high risk, bilateral: Secondary | ICD-10-CM | POA: Diagnosis not present

## 2022-06-28 DIAGNOSIS — H2513 Age-related nuclear cataract, bilateral: Secondary | ICD-10-CM | POA: Diagnosis not present

## 2022-06-28 LAB — HM DIABETES EYE EXAM

## 2022-06-28 NOTE — Telephone Encounter (Signed)
I also sent letter in mail.

## 2022-06-28 NOTE — Telephone Encounter (Signed)
Spoke to pt . Relayed that new machine order placed to [facility], as insurance medicaid denied sleep study.  She should get contacted by [facility] within a week about new machine and authorization.  Made appt for her initial cpap with SS/NP for 10-03-2022 at 1245.  (On cancellation list for later am).  I will send her info about DME # address via mychart.  She verbalized understanding.

## 2022-06-29 ENCOUNTER — Encounter: Payer: Self-pay | Admitting: Dietician

## 2022-06-29 NOTE — Telephone Encounter (Signed)
New, [name], RN; [name], [name] Received, Thank you!      Previous Messages    ----- Message -----  From: Brandon Melnick, RN  Sent: 06/28/2022   9:51 AM EDT  To: Darlina Guys; Miquel Dunn; Stephannie Peters; *  Subject: new machine order                               Hi ,  New cpap machine order for patient.   [name] [name] "KRIS"  Female, 51 y.o., 1970/10/10  MRN:  [id number]  Phone:  [phone]  [facility]

## 2022-07-01 ENCOUNTER — Other Ambulatory Visit (INDEPENDENT_AMBULATORY_CARE_PROVIDER_SITE_OTHER): Payer: Self-pay | Admitting: Family Medicine

## 2022-07-01 DIAGNOSIS — E559 Vitamin D deficiency, unspecified: Secondary | ICD-10-CM

## 2022-07-03 NOTE — Telephone Encounter (Signed)
  Order has been received. However, I am in need of the sleep study that was ordered 8/23. Notes show patient is non-compliant and needs new sleep study but I am unable to locate the ordered study. It looks like it was not performed.   Without a face to face note showing usage and benefit of cpap a new sleep study will need to be performed.  all the notes I seen show non-compliance and issue with pap.   please advise   Thank you,   Brad New      Previous Messages    ----- Message -----  From: Miquel Dunn  Sent: 06/29/2022   9:07 AM EDT  To: Darlina Guys; Miquel Dunn; Stephannie Peters; *  Subject: RE: new machine order                           Received, Thank you!   ----- Message -----  From: Brandon Melnick, RN  Sent: 06/28/2022   9:51 AM EDT  To: Darlina Guys; Miquel Dunn; Stephannie Peters; *  Subject: new machine order                               Hi ,  New cpap machine order for patient.   Allene Pyo "KRIS"  Female, 51 y.o., 1971-04-19  MRN:  [id number]  Phone:  [phone]  Lovey Newcomer RN   I sent a message back to [facility], that Split Night Study was done 2017(08-07-2016)  (located in MEDIA or PROCEDURES).  If needs to be faxed will be glad to do so.

## 2022-07-04 NOTE — Telephone Encounter (Signed)
New, [name] Rosenthal, RN; Reola Calkins Susa Loffler,   All the notes dating back one year show non usage and issues with using the cpap.  Her last sleep study was 5 years ago.      I have pulled the notes from system, the notes regarding the denial along with your reply and sent to manager of intake for review.  I will let you know when I get a reply.     Thank you,   Brad New      Previous Messages    ----- Message -----  From: Brandon Melnick, RN  Sent: 07/03/2022   1:22 PM EDT  To: Leory Plowman New  Subject: FW: new machine order                           Lamar Benes,  looks like we have tried to get HST then IN LAB testing approved and per medicaid they have stated:      Medicaid [employer] has denied the HST as well.     "It is not supported when requested to supply new PAP equipment. Repeat sleep testing is not supported  once the source of your problem has been found. "     Insurance is stating to go ahead and order new PAP equipment and supplies.   Electronically signed by Elvera Lennox L at 06/27/2022  8:47 AM   Signed

## 2022-07-07 ENCOUNTER — Other Ambulatory Visit (INDEPENDENT_AMBULATORY_CARE_PROVIDER_SITE_OTHER): Payer: Self-pay | Admitting: Family Medicine

## 2022-07-07 DIAGNOSIS — E1165 Type 2 diabetes mellitus with hyperglycemia: Secondary | ICD-10-CM

## 2022-07-11 ENCOUNTER — Ambulatory Visit (INDEPENDENT_AMBULATORY_CARE_PROVIDER_SITE_OTHER): Payer: Medicaid Other | Admitting: Family Medicine

## 2022-07-11 ENCOUNTER — Other Ambulatory Visit (INDEPENDENT_AMBULATORY_CARE_PROVIDER_SITE_OTHER): Payer: Self-pay | Admitting: Family Medicine

## 2022-07-11 DIAGNOSIS — E1165 Type 2 diabetes mellitus with hyperglycemia: Secondary | ICD-10-CM

## 2022-07-11 DIAGNOSIS — E559 Vitamin D deficiency, unspecified: Secondary | ICD-10-CM

## 2022-07-13 ENCOUNTER — Encounter (INDEPENDENT_AMBULATORY_CARE_PROVIDER_SITE_OTHER): Payer: Self-pay | Admitting: Family Medicine

## 2022-07-13 ENCOUNTER — Telehealth (INDEPENDENT_AMBULATORY_CARE_PROVIDER_SITE_OTHER): Payer: Medicaid Other | Admitting: Family Medicine

## 2022-07-13 DIAGNOSIS — E1165 Type 2 diabetes mellitus with hyperglycemia: Secondary | ICD-10-CM

## 2022-07-13 DIAGNOSIS — Z6841 Body Mass Index (BMI) 40.0 and over, adult: Secondary | ICD-10-CM | POA: Diagnosis not present

## 2022-07-13 DIAGNOSIS — E559 Vitamin D deficiency, unspecified: Secondary | ICD-10-CM | POA: Diagnosis not present

## 2022-07-13 DIAGNOSIS — E669 Obesity, unspecified: Secondary | ICD-10-CM | POA: Diagnosis not present

## 2022-07-13 DIAGNOSIS — Z7985 Long-term (current) use of injectable non-insulin antidiabetic drugs: Secondary | ICD-10-CM | POA: Diagnosis not present

## 2022-07-13 MED ORDER — OZEMPIC (0.25 OR 0.5 MG/DOSE) 2 MG/3ML ~~LOC~~ SOPN: 0.5000 mg | PEN_INJECTOR | SUBCUTANEOUS | 0 refills | Status: DC

## 2022-07-13 MED ORDER — VITAMIN D (ERGOCALCIFEROL) 1.25 MG (50000 UNIT) PO CAPS: 50000.0000 [IU] | ORAL_CAPSULE | ORAL | 0 refills | Status: DC

## 2022-07-13 MED ORDER — OZEMPIC (0.25 OR 0.5 MG/DOSE) 2 MG/3ML ~~LOC~~ SOPN: 0.2500 mg | PEN_INJECTOR | SUBCUTANEOUS | 0 refills | Status: DC

## 2022-07-13 NOTE — Progress Notes (Signed)
TeleHealth Visit:  This visit was completed with telemedicine (audio/video) technology. [name] [name] has verbally consented to this TeleHealth visit. The patient is located at home, the provider is located at home. The participants in this visit include the listed provider and patient. The visit was conducted today via MyChart video.  OBESITY [name] [name] is here to discuss her progress with her obesity treatment plan along with follow-up of her obesity related diagnoses.   Today's visit was # 5 Starting weight: 267 lbs Starting date: 03/16/2022 Weight at last in office visit: 262 lbs on 05/03/22 Total weight loss: 5 lbs at last in office visit on 05/03/22. Today's reported weight: 260 lbs   Nutrition Plan: the Category 3 Plan - 60% adherence  Current exercise: walking some in evenings.  Interim History: [name] [name] has not felt well recently which is why she missed her visit with Dr. Jeani Sow on Nov. 17th. She feels that she is doing much better with the meal plan.  She estimates 60% adherence.  She has cut back on sweet tea and is making it with less sugar.  She used to drink a 2 L of soda daily before starting our program.  She cooks dinner at home.  She sometimes skips breakfast and lunch within the same day.  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  She is not hungry in the morning. She occasionally has an Ensure protein drink. She notes increased cravings recently which has led to extra snacking. The Ozempic is not working as well as it was for appetite and cravings.  She would like to alternate in person and virtual visits.  Assessment/Plan:  1. Vitamin D Deficiency Vitamin D is not at goal of 50.  Vitamin D low at 22.4 on 03/20/2022. She is on weekly prescription Vitamin D 50,000 IU.  Lab Results  Component Value Date   VD25OH 22.4 (L) 03/20/2022    Plan: Refill prescription vitamin D 50,000 IU weekly.  2. Type II Diabetes HgbA1c is at goal. Last A1c was  6.8 on 03/20/2022. Episodes of hypoglycemia: no Medication(s): Ozempic 0.25 mg weekly.  Notes increased cravings and hunger. Denies adverse side effects.  Lab Results  Component Value Date   HGBA1C 6.8 (H) 03/20/2022   HGBA1C 6.3 (A) 11/23/2021   HGBA1C 6.7 (A) 08/03/2021   Lab Results  Component Value Date   LDLCALC 100 (H) 03/20/2022   CREATININE 0.81 03/20/2022    Plan: Increase dose and refill Ozempic 0.5 mg weekly.  3. Obesity: Current BMI 47.9 [name] [name] is currently in the action stage of change. As such, her goal is to continue with weight loss efforts.  She has agreed to the Category 3 Plan.   1.  Encouraged increase vegetable intake.  Discussed buying the frozen steamer bags for convenience. 2.  May have protein shake at breakfast.  Try Equate protein shake for cost savings. 3.  Pack food in her cooler when she knows she will not return home for lunch. 4.  Handouts sent via MyChart-protein equivalents, 100 and 200-calorie snack list.  Exercise goals: Continue to walk as tolerated.  Behavioral modification strategies: increasing lean protein intake, decreasing simple carbohydrates, increasing vegetables, decreasing liquid calories, increasing high fiber foods, meal planning and cooking strategies, and planning for success.  [name] [name] has agreed to follow-up with our clinic in 3 weeks.   No orders of the defined types were placed in this encounter.   There are no discontinued medications.   No orders of  the defined types were placed in this encounter.     Objective:   VITALS: Per patient if applicable, see vitals. GENERAL: Alert and in no acute distress. CARDIOPULMONARY: No increased WOB. Speaking in clear sentences.  PSYCH: Pleasant and cooperative. Speech normal rate and rhythm. Affect is appropriate. Insight and judgement are appropriate. Attention is focused, linear, and appropriate.  NEURO: Oriented as arrived to appointment on time with no prompting.   Lab  Results  Component Value Date   CREATININE 0.81 03/20/2022   BUN 8 03/20/2022   NA 140 03/20/2022   K 4.4 03/20/2022   CL 104 03/20/2022   CO2 22 03/20/2022   Lab Results  Component Value Date   ALT 18 03/20/2022   AST 18 03/20/2022   ALKPHOS 94 03/20/2022   BILITOT 0.3 03/20/2022   Lab Results  Component Value Date   HGBA1C 6.8 (H) 03/20/2022   HGBA1C 6.3 (A) 11/23/2021   HGBA1C 6.7 (A) 08/03/2021   HGBA1C 7.0 (A) 12/09/2020   HGBA1C 6.7 (A) 07/07/2020   Lab Results  Component Value Date   INSULIN 20.0 03/20/2022   Lab Results  Component Value Date   TSH 2.620 03/20/2022   Lab Results  Component Value Date   CHOL 168 03/20/2022   HDL 57 03/20/2022   LDLCALC 100 (H) 03/20/2022   TRIG 57 03/20/2022   CHOLHDL 2.9 08/03/2021   Lab Results  Component Value Date   WBC 7.3 03/20/2022   HGB 12.5 03/20/2022   HCT 40.3 03/20/2022   MCV 83 03/20/2022   PLT 390 03/20/2022   Lab Results  Component Value Date   IRON 36 06/27/2016   TIBC 294 06/27/2016   FERRITIN 140 12/09/2020   Lab Results  Component Value Date   VD25OH 22.4 (L) 03/20/2022    Attestation Statements:   Reviewed by clinician on day of visit: allergies, medications, problem list, medical history, surgical history, family history, social history, and previous encounter notes.

## 2022-07-17 NOTE — Telephone Encounter (Signed)
New, [name], RN; Darlina Guys; [place], Blima Dessert, [facility],   Due to the insurance it will take a little bit to process. Josem Kaufmann has been requested and we are awaiting reply from the insurance auth team at this time. Patient was notified 07/14/22.   Thank you,   Leroy Sea new      Previous

## 2022-07-17 NOTE — Telephone Encounter (Signed)
Ok sent another community message to see what the status is.

## 2022-07-17 NOTE — Telephone Encounter (Signed)
RE: [name] Received: 1 week ago [name], [name] [name]  [name], [name] [name]; [name] [name], [name] [name] [name] [name],   Sorry for the delay. I have had a couple of managers review this order and what next steps can / will be.  We are going to try to process this with the old info and see if insurance will provide auth. since they have denied your request for the [name] sleep test.  This insurance does things a little different than some of the others.    If something happens and they will not review for auth we will let you know.   Thank you,   [name] [name]        Previous Messages    ----- Message -----  From: [name] [name]  Sent: 07/03/2022   2:03 PM EDT  To: [name] [name]; [name] [name], [name] [name]  Subject: RE: [name]                           [name] [name],   All the notes dating back one year show non usage and issues with using the cpap.  Her last sleep study was 5 years ago.      I have pulled the notes from system, the notes regarding the denial along with your reply and sent to manager of intake for review.  I will let you know when I get a reply.     Thank you,   [name] [name]   ----- Message -----  From: [name] [name], [name] [name]  Sent: 07/03/2022   1:22 PM EDT  To: [name] [name]  Subject: FW: [name]                           [name] [name],  looks like we have tried to get HST then IN LAB testing approved and per medicaid they have stated:      Medicaid [employer] has denied the HST as well.     "It is not supported when requested to supply [name] PAP equipment. Repeat sleep testing is not supported  once the source of your problem has been found. "     Insurance is stating to go ahead and order [name] PAP equipment and supplies.   Electronically signed by [name] [name] at 06/27/2022  8:47 AM   Signed     Please advise patient that her insurance has denied a laboratory sleep study as well as a home sleep test.  I am okay ordering her a [name] AutoPap machine.  Please go over the compliance  expectations and need for follow-up within 30 to 90 days.  Please schedule with nurse practitioner for first follow-up after set up with AutoPap.     Electronically signed by [name] [name], [name] [name] at 06/27/2022 10:23 AM    ----- Message -----  From: [name] [name]  Sent: 07/03/2022   9:44 AM EDT  To: [name] [name], [name] [name]  Subject: RE: [name]                           Hello,   I have the sleep study from 2017, I need a copy of the sleep study listed as 05-16-22.  Its talked about in the Notes. Notes state patient is non-compliant and needs [name] sleep study.   I also need a face to face note showing usage and  benefit of cpap.  Without the face 2 face note a [name] sleep study will need to be performed.  All the notes I seen show non-compliance and issue with pap.     The Dr. can updated OV notes to show usage and benefit of the pap unit if he sees fit  and I can process with old sleep study, however the nots do not show this.    Without the face 2 face notes, Insurance will not cover.   Thank you,   [name] [name]     ----- Message -----  From: [name] [name], [name] [name]  Sent: 07/03/2022   9:30 AM EDT  To: [name] [name]  Subject: FW: [name]                           Good morning,    the split night study is back in 2017.  (It is in media or in procedures at that date).  I can fax to you as well if you want.  [name] [name] [name] [name]  ----- Message -----  From: [name] [name]  Sent: 06/30/2022  10:58 AM EDT  To: [name] [name]; [name] [name]; [name] [name]; *  Subject: RE: [name]                           Hello [name] [name],   Order has been received. However, I am in need of the sleep study that was ordered 8/23. Notes show patient is non-compliant and needs [name] sleep study but I am unable to locate the ordered study. It looks like it was not performed.   Without a face to face note showing usage and benefit of cpap a [name] sleep study will need to be performed.  all the notes I seen  show non-compliance and issue with pap.   please advise   Thank you,   [name] [name]   ----- Message -----  From: [name] [name]  Sent: 06/29/2022   9:07 AM EDT  To: [name] [name]; [name] [name]; [name] [name]; *  Subject: RE: [name]                           Received, Thank you!   ----- Message -----  From: [name] [name], [name] [name]  Sent: 06/28/2022   9:51 AM EDT  To: [name] [name]; [name] [name]; [name] [name]; *  Subject: [name]                               Hi ,  [name] cpap machine order for patient.   [name]  Female, 51 y.o., 04/28/1971  MRN:  [id number]  Phone:  [phone]  [facility]

## 2022-07-19 ENCOUNTER — Encounter (INDEPENDENT_AMBULATORY_CARE_PROVIDER_SITE_OTHER): Payer: Self-pay | Admitting: *Deleted

## 2022-07-19 DIAGNOSIS — H5213 Myopia, bilateral: Secondary | ICD-10-CM | POA: Diagnosis not present

## 2022-07-24 ENCOUNTER — Other Ambulatory Visit (INDEPENDENT_AMBULATORY_CARE_PROVIDER_SITE_OTHER): Payer: Self-pay | Admitting: Family Medicine

## 2022-07-24 DIAGNOSIS — E1165 Type 2 diabetes mellitus with hyperglycemia: Secondary | ICD-10-CM

## 2022-07-26 ENCOUNTER — Other Ambulatory Visit (INDEPENDENT_AMBULATORY_CARE_PROVIDER_SITE_OTHER): Payer: Self-pay | Admitting: Family Medicine

## 2022-07-26 DIAGNOSIS — E1165 Type 2 diabetes mellitus with hyperglycemia: Secondary | ICD-10-CM

## 2022-07-26 DIAGNOSIS — Z419 Encounter for procedure for purposes other than remedying health state, unspecified: Secondary | ICD-10-CM | POA: Diagnosis not present

## 2022-07-26 DIAGNOSIS — E559 Vitamin D deficiency, unspecified: Secondary | ICD-10-CM

## 2022-07-31 DIAGNOSIS — G4733 Obstructive sleep apnea (adult) (pediatric): Secondary | ICD-10-CM | POA: Diagnosis not present

## 2022-08-02 ENCOUNTER — Encounter (INDEPENDENT_AMBULATORY_CARE_PROVIDER_SITE_OTHER): Payer: Self-pay | Admitting: Family Medicine

## 2022-08-02 ENCOUNTER — Ambulatory Visit (INDEPENDENT_AMBULATORY_CARE_PROVIDER_SITE_OTHER): Payer: Medicaid Other | Admitting: Family Medicine

## 2022-08-02 VITALS — BP 131/64 | HR 85 | Temp 98.5°F | Ht 62.0 in | Wt 257.0 lb

## 2022-08-02 DIAGNOSIS — E1165 Type 2 diabetes mellitus with hyperglycemia: Secondary | ICD-10-CM | POA: Diagnosis not present

## 2022-08-02 DIAGNOSIS — E559 Vitamin D deficiency, unspecified: Secondary | ICD-10-CM | POA: Diagnosis not present

## 2022-08-02 DIAGNOSIS — Z6841 Body Mass Index (BMI) 40.0 and over, adult: Secondary | ICD-10-CM

## 2022-08-02 DIAGNOSIS — E669 Obesity, unspecified: Secondary | ICD-10-CM

## 2022-08-02 DIAGNOSIS — Z7985 Long-term (current) use of injectable non-insulin antidiabetic drugs: Secondary | ICD-10-CM

## 2022-08-02 MED ORDER — OZEMPIC (0.25 OR 0.5 MG/DOSE) 2 MG/3ML ~~LOC~~ SOPN: 0.5000 mg | PEN_INJECTOR | SUBCUTANEOUS | 0 refills | Status: DC

## 2022-08-02 MED ORDER — VITAMIN D (ERGOCALCIFEROL) 1.25 MG (50000 UNIT) PO CAPS: 50000.0000 [IU] | ORAL_CAPSULE | ORAL | 0 refills | Status: DC

## 2022-08-09 NOTE — Progress Notes (Signed)
Chief Complaint:   OBESITY [name] [name] is here to discuss her progress with her obesity treatment plan along with follow-up of her obesity related diagnoses. [name] [name] is on the Category 3 Plan and states she is following her eating plan approximately 65-70% of the time. [name] [name] states she is walking 30 minutes 2-3 times per week.  Today's visit was #: 6 Starting weight: 267 lbs Starting date: 03/16/2022 Today's weight: 257 lbs Today's date: 08/02/2022 Total lbs lost to date: 10 lbs Total lbs lost since last in-office visit: 5  Interim History: Greg is walking when she can. Getting better at Cat 3 breakfast of eggs and protein. Did not like Equate shakes. Has seen Dawn the last 2 appointments. Following Cat 3 around 65% of the time. Breakfast is still the hardest meal. Has not decided what she will do for Thanksgiving.  Subjective:   1. Type 2 diabetes mellitus with hyperglycemia, without long-term current use of insulin (HCC) Ozempic is helping decrease cravings for carbohydrates. On 0.5 mg dose.  2. Vitamin D deficiency [name] [name] is currently taking prescription Vit D 50,000 IU once a week. Denies any nausea, vomiting or muscle weakness. She notes fatigue.  Assessment/Plan:   1. Type 2 diabetes mellitus with hyperglycemia, without long-term current use of insulin (HCC) We will refill Ozempic 0.5 mg SubQ once weekly for 1 month with 0 refills.  -Refill Semaglutide,0.25 or 0.5MG /DOS, (OZEMPIC, 0.25 OR 0.5 MG/DOSE,) 2 MG/3ML SOPN; Inject 0.5 mg into the skin once a week.  Dispense: 3 mL; Refill: 0  2. Vitamin D deficiency We will refill Vit D 50K IU once a week for 1 month with 0 refills.  -Refill Vitamin D, Ergocalciferol, (DRISDOL) 1.25 MG (50000 UNIT) CAPS capsule; Take 1 capsule (50,000 Units total) by mouth every 7 (seven) days.  Dispense: 4 capsule; Refill: 0  3. Obesity with current BMI of 47.0 [name] [name] is currently in the action stage of change. As such, her goal is to  continue with weight loss efforts. She has agreed to the Category 3 Plan.   Exercise goals: All adults should avoid inactivity. Some physical activity is better than none, and adults who participate in any amount of physical activity gain some health benefits.  Behavioral modification strategies: increasing lean protein intake, meal planning and cooking strategies, keeping healthy foods in the home, holiday eating strategies , and planning for success.  Erick has agreed to follow-up with our clinic in 3 weeks. She was informed of the importance of frequent follow-up visits to maximize her success with intensive lifestyle modifications for her multiple health conditions.   Objective:   Blood pressure 131/64, pulse 85, temperature 98.5 F (36.9 C), height 5\' 2"  (1.575 m), weight 257 lb (116.6 kg), SpO2 98 %. Body mass index is 47.01 kg/m.  General: Cooperative, alert, well developed, in no acute distress. HEENT: Conjunctivae and lids unremarkable. Cardiovascular: Regular rhythm.  Lungs: Normal work of breathing. Neurologic: No focal deficits.   Lab Results  Component Value Date   CREATININE 0.81 03/20/2022   BUN 8 03/20/2022   NA 140 03/20/2022   K 4.4 03/20/2022   CL 104 03/20/2022   CO2 22 03/20/2022   Lab Results  Component Value Date   ALT 18 03/20/2022   AST 18 03/20/2022   ALKPHOS 94 03/20/2022   BILITOT 0.3 03/20/2022   Lab Results  Component Value Date   HGBA1C 6.8 (H) 03/20/2022   HGBA1C 6.3 (A) 11/23/2021   HGBA1C 6.7 (A) 08/03/2021  HGBA1C 7.0 (A) 12/09/2020   HGBA1C 6.7 (A) 07/07/2020   Lab Results  Component Value Date   INSULIN 20.0 03/20/2022   Lab Results  Component Value Date   TSH 2.620 03/20/2022   Lab Results  Component Value Date   CHOL 168 03/20/2022   HDL 57 03/20/2022   LDLCALC 100 (H) 03/20/2022   TRIG 57 03/20/2022   CHOLHDL 2.9 08/03/2021   Lab Results  Component Value Date   VD25OH 22.4 (L) 03/20/2022   Lab Results   Component Value Date   WBC 7.3 03/20/2022   HGB 12.5 03/20/2022   HCT 40.3 03/20/2022   MCV 83 03/20/2022   PLT 390 03/20/2022   Lab Results  Component Value Date   IRON 36 06/27/2016   TIBC 294 06/27/2016   FERRITIN 140 12/09/2020   Attestation Statements:   Reviewed by clinician on day of visit: allergies, medications, problem list, medical history, surgical history, family history, social history, and previous encounter notes.  I, Fortino Sic, RMA am acting as transcriptionist for Reuben Likes, MD.  I have reviewed the above documentation for accuracy and completeness, and I agree with the above. - Reuben Likes, MD

## 2022-08-22 NOTE — Progress Notes (Unsigned)
TeleHealth Visit:  This visit was completed with telemedicine (audio/video) technology. [name] [name] has verbally consented to this TeleHealth visit. The patient is located at home, the provider is located at home. The participants in this visit include the listed provider and patient. The visit was conducted today via phone call.  Unsuccessful attempt was made to connect to video. Length of call was 12 minutes.   OBESITY [name] [name] is here to discuss her progress with her obesity treatment plan along with follow-up of her obesity related diagnoses.   Today's visit was # 7 Starting weight: 267 lbs Starting date: 03/16/2022 Weight at last in office visit: 257 lbs on 08/02/22 Total weight loss: 10 lbs at last in office visit on 08/02/22. Today's reported weight: No weight reported.  Nutrition Plan: the Category 3 Plan.   Current exercise:  walking twice weekly for 30-60 minutes  Interim History: [name] [name] is skipping less meals.  Feels she has more energy.  She is now eating breakfast on plan and having a sandwich or two tuna packs for lunch.  She generally cooks dinner at home.  She made chili last week.  Had a [place] sandwich last night. She has cut back on sweet tea but she does have a few sodas per week.  She has increased her water intake.  She notices earlier satiety with Ozempic.  She used to be able to eat whole pie and now she only eats 1 piece.  Assessment/Plan:  1. Type II Diabetes HgbA1c is at goal. Last A1c was 6.8 CBGs: Fasting low 100s Episodes of hypoglycemia: no Medication(s): Ozempic 0.5 mg weekly Constipation improved. Lab Results  Component Value Date   HGBA1C 6.8 (H) 03/20/2022   HGBA1C 6.3 (A) 11/23/2021   HGBA1C 6.7 (A) 08/03/2021   Lab Results  Component Value Date   LDLCALC 100 (H) 03/20/2022   CREATININE 0.81 03/20/2022   Plan: Refill Ozempic 0.5 mg weekly.  2. Vitamin D Deficiency Vitamin D is not at goal of 50.  Last vitamin D was low at 22.4 on  03/20/2022. She is on weekly prescription Vitamin D 50,000 IU.  Lab Results  Component Value Date   VD25OH 22.4 (L) 03/20/2022    Plan: Refill prescription vitamin D 50,000 IU weekly. Check vitamin D level in near future.   3. Obesity: Current BMI 47 [name] [name] is currently in the action stage of change. As such, her goal is to continue with weight loss efforts.  She has agreed to the Category 3 Plan.   Exercise goals:  as is  Behavioral modification strategies: increasing lean protein intake, decreasing simple carbohydrates, decreasing liquid calories, and no skipping meals.  [name] [name] has agreed to follow-up with our clinic in 3 weeks.   No orders of the defined types were placed in this encounter.   Medications Discontinued During This Encounter  Medication Reason   Vitamin D, Ergocalciferol, (DRISDOL) 1.25 MG (50000 UNIT) CAPS capsule Reorder   Semaglutide,0.25 or 0.5MG /DOS, (OZEMPIC, 0.25 OR 0.5 MG/DOSE,) 2 MG/3ML SOPN Reorder   Insulin Pen Needle (BD PEN NEEDLE NANO 2ND GEN) 32G X 4 MM MISC Discontinued by provider     Meds ordered this encounter  Medications   Vitamin D, Ergocalciferol, (DRISDOL) 1.25 MG (50000 UNIT) CAPS capsule    Sig: Take 1 capsule (50,000 Units total) by mouth every 7 (seven) days.    Dispense:  4 capsule    Refill:  0    Order Specific Question:   Supervising Provider    Answer:  BOWEN, KAREN E [[id number]]   Semaglutide,0.25 or 0.5MG /DOS, (OZEMPIC, 0.25 OR 0.5 MG/DOSE,) 2 MG/3ML SOPN    Sig: Inject 0.5 mg into the skin once a week.    Dispense:  3 mL    Refill:  0    Order Specific Question:   Supervising Provider    Answer:   Glennis Brink [[id number]]      Objective:   VITALS: Per patient if applicable, see vitals. GENERAL: Alert and in no acute distress. CARDIOPULMONARY: No increased WOB. Speaking in clear sentences.  PSYCH: Pleasant and cooperative. Speech normal rate and rhythm. Affect is appropriate. Insight and judgement are appropriate.  Attention is focused, linear, and appropriate.  NEURO: Oriented as arrived to appointment on time with no prompting.   Lab Results  Component Value Date   CREATININE 0.81 03/20/2022   BUN 8 03/20/2022   NA 140 03/20/2022   K 4.4 03/20/2022   CL 104 03/20/2022   CO2 22 03/20/2022   Lab Results  Component Value Date   ALT 18 03/20/2022   AST 18 03/20/2022   ALKPHOS 94 03/20/2022   BILITOT 0.3 03/20/2022   Lab Results  Component Value Date   HGBA1C 6.8 (H) 03/20/2022   HGBA1C 6.3 (A) 11/23/2021   HGBA1C 6.7 (A) 08/03/2021   HGBA1C 7.0 (A) 12/09/2020   HGBA1C 6.7 (A) 07/07/2020   Lab Results  Component Value Date   INSULIN 20.0 03/20/2022   Lab Results  Component Value Date   TSH 2.620 03/20/2022   Lab Results  Component Value Date   CHOL 168 03/20/2022   HDL 57 03/20/2022   LDLCALC 100 (H) 03/20/2022   TRIG 57 03/20/2022   CHOLHDL 2.9 08/03/2021   Lab Results  Component Value Date   WBC 7.3 03/20/2022   HGB 12.5 03/20/2022   HCT 40.3 03/20/2022   MCV 83 03/20/2022   PLT 390 03/20/2022   Lab Results  Component Value Date   IRON 36 06/27/2016   TIBC 294 06/27/2016   FERRITIN 140 12/09/2020   Lab Results  Component Value Date   VD25OH 22.4 (L) 03/20/2022    Attestation Statements:   Reviewed by clinician on day of visit: allergies, medications, problem list, medical history, surgical history, family history, social history, and previous encounter notes.

## 2022-08-23 ENCOUNTER — Telehealth (INDEPENDENT_AMBULATORY_CARE_PROVIDER_SITE_OTHER): Payer: Medicaid Other | Admitting: Family Medicine

## 2022-08-23 ENCOUNTER — Encounter (INDEPENDENT_AMBULATORY_CARE_PROVIDER_SITE_OTHER): Payer: Self-pay | Admitting: Family Medicine

## 2022-08-23 DIAGNOSIS — E669 Obesity, unspecified: Secondary | ICD-10-CM | POA: Diagnosis not present

## 2022-08-23 DIAGNOSIS — Z7985 Long-term (current) use of injectable non-insulin antidiabetic drugs: Secondary | ICD-10-CM | POA: Diagnosis not present

## 2022-08-23 DIAGNOSIS — E1165 Type 2 diabetes mellitus with hyperglycemia: Secondary | ICD-10-CM

## 2022-08-23 DIAGNOSIS — Z6841 Body Mass Index (BMI) 40.0 and over, adult: Secondary | ICD-10-CM | POA: Diagnosis not present

## 2022-08-23 DIAGNOSIS — E559 Vitamin D deficiency, unspecified: Secondary | ICD-10-CM | POA: Diagnosis not present

## 2022-08-23 MED ORDER — OZEMPIC (0.25 OR 0.5 MG/DOSE) 2 MG/3ML ~~LOC~~ SOPN: 0.5000 mg | PEN_INJECTOR | SUBCUTANEOUS | 0 refills | Status: DC

## 2022-08-23 MED ORDER — VITAMIN D (ERGOCALCIFEROL) 1.25 MG (50000 UNIT) PO CAPS: 50000.0000 [IU] | ORAL_CAPSULE | ORAL | 0 refills | Status: DC

## 2022-08-25 DIAGNOSIS — G4733 Obstructive sleep apnea (adult) (pediatric): Secondary | ICD-10-CM | POA: Diagnosis not present

## 2022-08-25 DIAGNOSIS — Z419 Encounter for procedure for purposes other than remedying health state, unspecified: Secondary | ICD-10-CM | POA: Diagnosis not present

## 2022-09-04 ENCOUNTER — Ambulatory Visit (INDEPENDENT_AMBULATORY_CARE_PROVIDER_SITE_OTHER): Payer: Medicaid Other | Admitting: Student

## 2022-09-04 ENCOUNTER — Encounter: Payer: Self-pay | Admitting: Student

## 2022-09-04 VITALS — BP 124/86 | HR 93 | Temp 98.3°F | Wt 260.7 lb

## 2022-09-04 DIAGNOSIS — E119 Type 2 diabetes mellitus without complications: Secondary | ICD-10-CM | POA: Diagnosis not present

## 2022-09-04 DIAGNOSIS — Z23 Encounter for immunization: Secondary | ICD-10-CM | POA: Diagnosis not present

## 2022-09-04 DIAGNOSIS — M25562 Pain in left knee: Secondary | ICD-10-CM | POA: Diagnosis not present

## 2022-09-04 DIAGNOSIS — F17220 Nicotine dependence, chewing tobacco, uncomplicated: Secondary | ICD-10-CM

## 2022-09-04 DIAGNOSIS — G8929 Other chronic pain: Secondary | ICD-10-CM

## 2022-09-04 DIAGNOSIS — M5416 Radiculopathy, lumbar region: Secondary | ICD-10-CM

## 2022-09-04 DIAGNOSIS — Z7985 Long-term (current) use of injectable non-insulin antidiabetic drugs: Secondary | ICD-10-CM | POA: Diagnosis not present

## 2022-09-04 DIAGNOSIS — I10 Essential (primary) hypertension: Secondary | ICD-10-CM | POA: Diagnosis not present

## 2022-09-04 LAB — POCT GLYCOSYLATED HEMOGLOBIN (HGB A1C): Hemoglobin A1C: 5.7 % — AB (ref 4.0–5.6)

## 2022-09-04 LAB — GLUCOSE, CAPILLARY: Glucose-Capillary: 91 mg/dL (ref 70–99)

## 2022-09-04 MED ORDER — GABAPENTIN 300 MG PO CAPS: 300.0000 mg | ORAL_CAPSULE | Freq: Every day | ORAL | 2 refills | Status: AC

## 2022-09-04 MED ORDER — ACETAMINOPHEN 325 MG PO TABS: 650.0000 mg | ORAL_TABLET | Freq: Three times a day (TID) | ORAL | 0 refills | Status: DC | PRN

## 2022-09-04 MED ORDER — IBUPROFEN 400 MG PO TABS: 800.0000 mg | ORAL_TABLET | Freq: Three times a day (TID) | ORAL | 2 refills | Status: AC | PRN

## 2022-09-04 NOTE — Assessment & Plan Note (Signed)
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 Her "shooting" pain is worse at night, 8-9/10. No red flags today. Exam is + for straight leg raise on the L>R.   Given there is no official allowed pain medications in DOT website, patient was advised to restart Gabapentin at night only for neuropathic pain.  If this continues to make her drowsy during the day, we could try Cymbalta vs referral for periradicular therapy -Restart Gabapentin 300 mg nightly

## 2022-09-04 NOTE — Assessment & Plan Note (Addendum)
Ozempic dosage 0.5 mg weekly. Patient has lost 30 lb since starting therapy. Foot exam today with normal microfilament exam, proprioception, and skin exam today.  Today: -Urine microalbumin creatinine -A1c  Addendum A1c resulted while patient was in clinic - 5.7.

## 2022-09-04 NOTE — Patient Instructions (Addendum)
Thank you, Ms.Leeona E Devinney for allowing [place] to provide your care today. Today we discussed your bilateral knee pain, yuur weight loss (congratulations!) and your diabetes.    For pain, Take Gabapentin 300 mg nightly And Ibuprofen and tylenol as needed. Do not take continuously for more than 10 days at a time  If this does not work, call us back and we can prescribe you another medication. It would be helpful to get a list from your DOT instructions.    I have ordered the following labs for you:   Lab Orders         POC Hbg A1C      I will call if any are abnormal. All of your labs can be accessed through "My Chart".   My Chart Access: [email or web address]  Please follow-up in in 12 weeks.  Please make sure to arrive 15 minutes prior to your next appointment. If you arrive late, you may be asked to reschedule.    We look forward to seeing you next time. Please call our clinic at [phone] if you have any questions or concerns. The best time to call is Monday-Friday from 9am-4pm, but there is someone available 24/7. If after hours or the weekend, call the main hospital number and ask for the Internal Medicine Resident On-Call. If you need medication refills, please notify your pharmacy one week in advance and they will send [place] a request.   Thank you for letting us take part in your care. Wishing you the best!  Morene Crocker, MD 09/04/2022, 10:19 AM [facility] Internal Medicine Resident, PGY-1

## 2022-09-04 NOTE — Progress Notes (Signed)
Patient aware.

## 2022-09-04 NOTE — Progress Notes (Signed)
Subjective:  CC: bilateral knee pain  HPI:  [name] [name] is a 51 y.o. female with a past medical history stated below and presents today for bilateral knee pain and chronic management. Please see problem based assessment and plan for additional details.  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 8 g 2   Blood Glucose Monitoring Suppl (ACCU-CHEK GUIDE) w/Device KIT Use to test blood sugar in the morning, Dx E11.9 1 kit 0   glucose blood (ACCU-CHEK GUIDE) test strip USE AS DIRECTED 100 each 0   rosuvastatin (CRESTOR) 20 MG tablet Take 1 tablet (20 mg total) by mouth daily. 90 tablet 2   Semaglutide,0.25 or 0.5MG/DOS, (OZEMPIC, 0.25 OR 0.5 MG/DOSE,) 2 MG/3ML SOPN Inject 0.5 mg into the skin once a week. 3 mL 0   Vitamin D, Ergocalciferol, (DRISDOL) 1.25 MG (50000 UNIT) CAPS capsule Take 1 capsule (50,000 Units total) by mouth every 7 (seven) days. 4 capsule 0   No current facility-administered medications on file prior to visit.     Family History  Problem Relation Age of Onset   High blood pressure Mother    Thyroid disease Mother    Sleep apnea Mother    Obesity Mother    Cervical cancer Maternal Grandmother    Cancer Other    Breast cancer Neg Hx    Colon cancer Neg Hx    Colon polyps Neg Hx    Crohn's disease Neg Hx    Esophageal cancer Neg Hx    Rectal cancer Neg Hx    Stomach cancer Neg Hx     Social History   Socioeconomic History   Marital status: Single    Spouse name: Not on file   Number of children: Not on file   Years of education: Not on file   Highest education level: Not on file  Occupational History   Not on file  Tobacco Use   Smoking status: Never    Passive exposure: Current   Smokeless tobacco: Never  Vaping Use   Vaping Use: Never used  Substance and Sexual Activity   Alcohol use: No    Alcohol/week: 0.0 standard drinks of alcohol   Drug use: No   Sexual activity: Not on file  Other Topics Concern  Not on file  Social History Narrative   Caffiene, 1 cup coffee in am, soda's - 1-2 can daily.   Education :  11th grade.   Working; Teacher, early years/pre.    Social Determinants of Health   Financial Resource Strain: Not on file  Food Insecurity: Not on file  Transportation Needs: Not on file  Physical Activity: Not on file  Stress: Not on file  Social Connections: Not on file  Intimate Partner Violence: Not on file    Review of Systems: ROS negative except for what is noted on the assessment and plan.  Objective:   Vitals:   09/04/22 0944  Weight: 260 lb 11.2 oz (118.3 kg)    Physical Exam: Constitutional: well-appearing woman sitting in chair, in no acute distress HENT: normocephalic atraumatic, mucous membranes moist Eyes: conjunctiva non-erythematous Neck: supple Cardiovascular: regular rate and rhythm, no m/r/g Pulmonary/Chest: normal work of breathing on room air, lungs clear to auscultation bilaterally Abdominal: soft, non-tender,  non-distended MSK: normal bulk and tone -- see specific assessment and plan Neurological: alert & oriented x 3, 5/5 strength in bilateral upper and lower extremities, normal gait Skin: warm and dry Psych: Pleasant mood and affect     Assessment & Plan:   Chronic pain of left knee Patient with history of 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   Patient feels she needs symptomatic relief for mild-mod (3-5/10) knee pain especially after exercising or increased walking. Patient is able to manage with topicals otherwise. Pateint does not want referral to physical therapy at this time. She has lost 30 pounds on Ozempic and is active.   On exam, there is no joint laxity, tenderness on palpation, effusions, locking or popping on bilateral knees. Patient gait is balanced and normal today. Plan: Offered bilateral joint injections, but patient deferred at this time. She would like to try Ibuprofen and Tylenol PRN - Will return if pain persist for bilateral knee injections  Chronic lumbar radiculopathy Patient reports chronic pain is back after stopping Gabapentin and flexeril therapy in the setting of DOT concerns for daytime sleepiness. Her "shooting" pain is worse at night, 8-9/10. No red flags today. Exam is + for straight leg raise on the L>R.   Given there is no official allowed pain medications in DOT website, patient was advised to restart Gabapentin at night only for neuropathic pain.  If this continues to make her drowsy during the day, we could try Cymbalta vs referral for periradicular therapy -Restart Gabapentin 300 mg nightly  Type 2 diabetes mellitus treated without insulin ([facility]) Ozempic dosage 0.5 mg weekly. Patient has lost 30 lb since starting therapy. Foot exam today with normal microfilament exam, proprioception, and skin exam today.  Today: -Urine microalbumin creatinine -A1c  Addendum A1c  resulted while patient was in clinic - 5.7.   Essential hypertension Patient previously on Lisinopril 10 mg. Her home BP has been <130/90 since starting CPAP therapy at home. She discontinued her Lisinopril.  blood pressure today 130/90.  -Continue to monitor   Patient seen with Dr. Philipp Ovens

## 2022-09-04 NOTE — Assessment & Plan Note (Signed)
Patient previously on Lisinopril 10 mg. Her home BP has been <130/90 since starting CPAP therapy at home. She discontinued her Lisinopril.  blood pressure today 130/90.  -Continue to monitor

## 2022-09-04 NOTE — Progress Notes (Signed)
Internal Medicine Clinic Attending  Case discussed with Dr. Gomez-Caraballo  At the time of the visit.  We reviewed the resident's history and exam and pertinent patient test results.  I agree with the assessment, diagnosis, and plan of care documented in the resident's note.  

## 2022-09-04 NOTE — Assessment & Plan Note (Signed)
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   Patient feels she needs symptomatic relief for mild-mod (3-5/10) knee pain especially after exercising or increased walking. Patient is able to manage with topicals otherwise. P[place]int does not want referral to physical therapy at this time. She has lost 30 pounds on Ozempic and is active.   On exam, there is no joint laxity, tenderness on palpation, effusions, locking or popping on bilateral knees. Patient gait is balanced and normal today. Plan: Offered bilateral joint injections, but patient deferred at this time. She would like to try Ibuprofen and Tylenol PRN - Will return if pain persist for bilateral knee injections

## 2022-09-05 LAB — MICROALBUMIN / CREATININE URINE RATIO
Creatinine, Urine: 134.2 mg/dL
Microalb/Creat Ratio: 3 mg/g creat (ref 0–29)
Microalbumin, Urine: 4.3 ug/mL

## 2022-09-06 ENCOUNTER — Encounter: Payer: Self-pay | Admitting: Student

## 2022-09-07 ENCOUNTER — Encounter (HOSPITAL_COMMUNITY): Payer: Self-pay | Admitting: Emergency Medicine

## 2022-09-07 ENCOUNTER — Other Ambulatory Visit: Payer: Self-pay

## 2022-09-07 ENCOUNTER — Ambulatory Visit (HOSPITAL_COMMUNITY)
Admission: EM | Admit: 2022-09-07 | Discharge: 2022-09-07 | Disposition: A | Discharge status: Home / Self Care | Payer: Medicaid Other | Attending: Internal Medicine | Admitting: Internal Medicine

## 2022-09-07 DIAGNOSIS — R059 Cough, unspecified: Secondary | ICD-10-CM | POA: Diagnosis not present

## 2022-09-07 DIAGNOSIS — Z8616 Personal history of COVID-19: Secondary | ICD-10-CM | POA: Diagnosis not present

## 2022-09-07 DIAGNOSIS — J069 Acute upper respiratory infection, unspecified: Secondary | ICD-10-CM

## 2022-09-07 DIAGNOSIS — Z1152 Encounter for screening for COVID-19: Secondary | ICD-10-CM | POA: Insufficient documentation

## 2022-09-07 DIAGNOSIS — J45909 Unspecified asthma, uncomplicated: Secondary | ICD-10-CM | POA: Insufficient documentation

## 2022-09-07 DIAGNOSIS — T50B95A Adverse effect of other viral vaccines, initial encounter: Secondary | ICD-10-CM | POA: Diagnosis not present

## 2022-09-07 DIAGNOSIS — R0981 Nasal congestion: Secondary | ICD-10-CM

## 2022-09-07 MED ORDER — BENZONATATE 100 MG PO CAPS: 100.0000 mg | ORAL_CAPSULE | Freq: Three times a day (TID) | ORAL | 0 refills | Status: DC

## 2022-09-07 NOTE — Telephone Encounter (Signed)
Patient called in stating she had body soreness following flu vaccine and needs to take today and tomorrow off work to get some rest. Explained body aches are a normal immune response to vaccine, it shows her immune system is working to protect her against the flu. Explained we have no openings in clinic today or tomorrow. She is advised to go to [facility] to see if they can provide her a work note.

## 2022-09-07 NOTE — Discharge Instructions (Addendum)
You have a viral upper respiratory infection.  COVID-19 testing is pending. We will call you with results if positive. If your COVID test is positive, you must stay at home until day 6 of symptoms. On day 6, you may go out into public and go back to work, but you must wear a mask until day 11 of symptoms to prevent spread to others.  Purchase mucinex (guaifenesin) 1200mg  and take this every 12 hours for the next few days to thin your nasal congestion and mucous so that you are able to get out of your body easier by coughing and blowing your nose. Drink plenty of water while taking this.  Take tessalon pearles every 8 hours as needed for cough.  You may take tylenol 1,000mg  and ibuprofen 600mg  every 6 hours with food as needed for fever/chills, sore throat, aches/pains, and inflammation associated with viral illness. Take this with food to avoid stomach upset.    Your flu shot injection site looks normal and without infection. Use warmth to the area to relax the muscle, you may also gently massage the area to help with pain.  If you develop any new or worsening symptoms, please return.  If your symptoms are severe, please go to the emergency room.  Follow-up with your primary care provider for further evaluation and management of your symptoms as well as ongoing wellness visits.  I hope you feel better!

## 2022-09-07 NOTE — ED Triage Notes (Signed)
Cough and congestion since Monday.  Phlegm is light yellow.  Has hot and cold flashes.   Complains of slight headache Patient reports she received flu shot on Monday.  Reports at injection site has a knot and and is sore.    Slightly red area and fullness at injections site that patient reports as painful.    pcp unable to see patient prior to January 2024  Prescribed ibuprofen- has not started taking this medicine Took nyquil last night and seemed to help sleep

## 2022-09-07 NOTE — ED Provider Notes (Addendum)
[place]    CSN: [id number] Arrival date & time: 09/07/22  3546      History   Chief Complaint Chief Complaint  Patient presents with   Cough    HPI [name] [name] is a 51 y.o. female.   Patient presents urgent care for evaluation of 3-day history of nasal congestion, cough, chills, and fatigue that started on Monday, September 04, 2022.  On this date, patient received her flu vaccination in her left arm and states she feels a "knot" to the left arm as well as continuing soreness to the left arm related to the vaccination.  She states the "knot" has reduced in size significantly over the last 3 days but she remains sore to the deltoid muscle.  She is wondering if this is normal after an influenza vaccination.  Cough is dry and nonproductive/worse at nighttime.  Nasal congestion makes it difficult for her to breathe out of her nose.  She has never experienced any adverse reactions to vaccines in the past and denies rash, numbness/tingling to the bilateral upper and lower extremities, and feelings of throat closure. She is vaccinated against COVID-19 and is not a smoker. Denies chest pain, shortness of breath, nausea, vomiting, abdominal pain, diarrhea, dizziness, weakness, and recent antibiotic/steroid use. History of asthma. She has not attempted use of any over the counter medications for symptoms prior to arrival at urgent care.    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   Over weight    Pneumonia 2006    Post viral syndrome 07/17/2019   Renal insufficiency 06/18/2019   Sleep apnea    CPAP   SOB (shortness of breath)    Stress at home    "and at work"    Patient Active Problem List   Diagnosis Date Noted   Tinnitus aurium, 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   Hyperlipidemia 08/04/2021   Hand dermatitis 02/04/2021   Chronic pain of left knee 10/05/2020   Orthopnea 07/07/2020   Left knee injury, subsequent encounter 01/14/2020   Pneumonia due to COVID-19 virus 06/18/2019   Renal insufficiency 06/18/2019   Posterior knee pain, right 02/21/2018   Essential hypertension 01/30/2018   Left wrist pain 11/28/2016   Type 2 diabetes mellitus treated without insulin ([place]) 02/16/2016   OSA (obstructive sleep apnea) 05/14/2015   Chronic lumbar radiculopathy 11/03/2014   Health care maintenance 09/07/2014   Class 3 obesity ([place]) 09/20/2006   Anemia, iron deficiency 08/09/2006    Past Surgical History:  Procedure Laterality Date   BREAST BIOPSY Left 07/29/2013   CESAREAN SECTION  08/2008   CHOLECYSTECTOMY  03/02/2012   Procedure: LAPAROSCOPIC CHOLECYSTECTOMY;  Surgeon: Gwenyth Ober, MD;  Location: [facility];  Service: General;;   KNEE ARTHROSCOPY WITH MENISCAL REPAIR Right 06/25/2018   Procedure: RIGHT KNEE ARTHROSCOPY WITH MENISCAL ROOT REPAIR;  Surgeon: Meredith Pel, MD;  Location: [facility];  Service: Orthopedics;  Laterality: Right;   KNEE ARTHROSCOPY WITH MENISCAL REPAIR Left 03/09/2020   Procedure: LEFT KNEE ARTHROSCOPY, MEDIAL MENISCAL ROOT REPAIR;  Surgeon: Meredith Pel, MD;  Location: [facility];  Service: Orthopedics;  Laterality: Left;   WISDOM TOOTH EXTRACTION      OB History     Gravida  1   Para  1   Term      Preterm      AB      Living         SAB      IAB      Ectopic      Multiple      Live Births  1            Home Medications    Prior to Admission medications   Medication Sig Start Date End Date Taking? Authorizing Provider  Accu-Chek Softclix Lancets  lancets Use as instructed 04/20/21   Riesa Pope, MD  acetaminophen (TYLENOL) 325 MG tablet Take 2 tablets (650 mg total) by mouth every 8 (eight) hours as needed. 09/04/22 10/04/22  Romana Juniper, MD  albuterol (PROAIR HFA) 108 (90 Base) MCG/ACT inhaler Inhale 1-2 puffs into the lungs every 6 (six) hours as needed for wheezing or shortness of breath. 06/29/20   [place], Carley D, DO  benzonatate (TESSALON) 100 MG capsule Take 1 capsule (100 mg total) by mouth every 8 (eight) hours. 09/07/22   Talbot Grumbling, FNP  Blood Glucose Monitoring Suppl (ACCU-CHEK GUIDE) w/Device KIT Use to test blood sugar in the morning, Dx E11.9 01/11/21   Iona Beard, MD  gabapentin (NEURONTIN) 300 MG capsule Take 1 capsule (300 mg total) by mouth at bedtime. 09/04/22 09/04/23  Romana Juniper, MD  glucose blood (ACCU-CHEK GUIDE) test strip USE AS DIRECTED 02/02/22   Timothy Lasso, MD  ibuprofen (ADVIL) 400 MG tablet Take 2 tablets (800 mg total) by mouth every 8 (eight) hours as needed. 09/04/22 09/04/23  Romana Juniper, MD  rosuvastatin (CRESTOR) 20 MG tablet Take 1 tablet (20 mg total) by mouth daily. Patient not taking: Reported on 09/07/2022 08/04/21 05/01/22  Aldine Contes, MD  Semaglutide,0.25 or 0.5MG/DOS, (OZEMPIC, 0.25 OR 0.5 MG/DOSE,) 2 MG/3ML SOPN Inject 0.5 mg into the skin once a week. 08/23/22   [place], Joneen Boers, FNP  Vitamin D, Ergocalciferol, (DRISDOL) 1.25 MG (50000 UNIT) CAPS capsule Take 1 capsule (50,000 Units total) by mouth every 7 (seven) days. 08/23/22   Whitmire, Joneen Boers, FNP    Family History Family History  Problem Relation Age of Onset   High blood pressure Mother    Thyroid disease Mother    Sleep apnea Mother    Obesity Mother    Cervical cancer Maternal Grandmother    Cancer Other    Breast cancer Neg Hx    Colon cancer Neg Hx    Colon polyps Neg Hx    Crohn's disease Neg Hx    Esophageal cancer Neg Hx    Rectal cancer Neg Hx     Stomach cancer Neg Hx     Social History Social History   Tobacco Use   Smoking status: Never    Passive exposure: Current   Smokeless tobacco: Never  Vaping Use   Vaping Use: Never used  Substance Use Topics   Alcohol use: No    Alcohol/week: 0.0 standard drinks of alcohol   Drug use: No     Allergies   Cat hair extract, Clorox nasal  antiseptic [povidone-iodine], and Mushroom extract complex   Review of Systems Review of Systems  Respiratory:  Positive for cough.   Per HPI   Physical Exam Triage Vital Signs ED Triage Vitals  Enc Vitals Group     BP 09/07/22 0956 112/75     Pulse Rate 09/07/22 0956 80     Resp 09/07/22 0956 (!) 22     Temp 09/07/22 0956 98.1 F (36.7 C)     Temp Source 09/07/22 0956 Oral     SpO2 09/07/22 0956 95 %     Weight --      Height --      Head Circumference --      Peak Flow --      Pain Score 09/07/22 0954 4     Pain Loc --      Pain Edu? --      Excl. in [place]? --    No data found.  Updated Vital Signs BP 112/75 (BP Location: Right Arm) Comment (BP Location): large on forearm  Pulse 80   Temp 98.1 F (36.7 C) (Oral)   Resp (!) 22   SpO2 95%   Visual Acuity Right Eye Distance:   Left Eye Distance:   Bilateral Distance:    Right Eye Near:   Left Eye Near:    Bilateral Near:     Physical Exam Vitals and nursing note reviewed.  Constitutional:      Appearance: Normal appearance. She is not ill-appearing or toxic-appearing.  HENT:     Head: Normocephalic and atraumatic.     Right Ear: Hearing, tympanic membrane, ear canal and external ear normal.     Left Ear: Hearing, tympanic membrane, ear canal and external ear normal.     Nose: Congestion present.     Mouth/Throat:     Lips: Pink.     Mouth: Mucous membranes are moist.     Pharynx: No posterior oropharyngeal erythema.     Comments: Mild erythema to posterior oropharynx with small amount of clear postnasal drainage visualized. Airway intact and patent. Eyes:      General: Lids are normal. Vision grossly intact. Gaze aligned appropriately.        Right eye: No discharge.        Left eye: No discharge.     Extraocular Movements: Extraocular movements intact.     Conjunctiva/sclera: Conjunctivae normal.  Cardiovascular:     Rate and Rhythm: Normal rate and regular rhythm.     Heart sounds: Normal heart sounds, S1 normal and S2 normal.  Pulmonary:     Effort: Pulmonary effort is normal. No respiratory distress.     Breath sounds: Normal breath sounds and air entry.  Musculoskeletal:     Cervical back: Neck supple.     Comments: TTP to the left deltoid, no evidence of soft tissue swelling to palpation, redness, lymphangitis, or decreased sensation/strength to the left upper extremity. Area is not warm, red, or swollen.  Lymphadenopathy:     Cervical: Cervical adenopathy present.  Skin:    General: Skin is warm and dry.     Capillary Refill: Capillary refill takes less than 2 seconds.     Findings: No rash.  Neurological:     General: No focal deficit present.     Mental Status: She is alert and oriented to person, place, and time. Mental status is at baseline.     Cranial Nerves: No dysarthria or facial asymmetry.     Motor: No weakness.  Gait: Gait normal.     Comments: Ambulatory with steady gait without assistance.  Psychiatric:        Mood and Affect: Mood normal.        Speech: Speech normal.        Behavior: Behavior normal.        Thought Content: Thought content normal.        Judgment: Judgment normal.      UC Treatments / Results  Labs (all labs ordered are listed, but only abnormal results are displayed) Labs Reviewed  SARS CORONAVIRUS 2 (TAT 6-24 HRS)    EKG   Radiology No results found.  Procedures Procedures (including critical care time)  Medications Ordered in UC Medications - No data to display  Initial Impression / Assessment and Plan / UC Course  I have reviewed the triage vital signs and the nursing  notes.  Pertinent labs & imaging results that were available during my care of the patient were reviewed by me and considered in my medical decision making (see chart for details).   Viral URI with cough Symptoms and physical exam consistent with a viral upper respiratory tract infection that will likely resolve with rest, fluids, and prescriptions for symptomatic relief. No indication for imaging today based on stable cardiopulmonary exam and hemodynamically stable vital signs. COVID-19 testing is pending.  We will call patient if this is positive.  Quarantine guidelines discussed. Currently on day 4 of symptoms and does qualify for antiviral therapy.   Tessalon pearles sent to pharmacy for symptomatic relief to be taken as prescribed.  May purchase mucinex OTC and take 1232m every 12 hours as needed for nasal congestion and cough.  May use ibuprofen/tylenol over the counter for body aches, fever/chills, and overall discomfort associated with viral illness. Nonpharmacologic interventions for symptom relief provided and after visit summary below.   2. Adverse reaction to influenza vaccine No evidence of infection to the influenza injection site.  Strict ED/urgent care return precautions given.  Patient verbalizes understanding and agreement with plan.  Counseled patient regarding possible side effects and uses of all medications prescribed at today's visit.  Patient verbalizes understanding and agreement with plan.  All questions answered.  Patient discharged from urgent care in stable condition.        Final Clinical Impressions(s) / UC Diagnoses   Final diagnoses:  Viral URI with cough  Nasal congestion  Adverse effect of influenza vaccine, initial encounter     Discharge Instructions      You have a viral upper respiratory infection.  COVID-19 testing is pending. We will call you with results if positive. If your COVID test is positive, you must stay at home until day 6 of  symptoms. On day 6, you may go out into public and go back to work, but you must wear a mask until day 11 of symptoms to prevent spread to others.  Purchase mucinex (guaifenesin) 12061mand take this every 12 hours for the next few days to thin your nasal congestion and mucous so that you are able to get out of your body easier by coughing and blowing your nose. Drink plenty of water while taking this.  Take tessalon pearles every 8 hours as needed for cough.  You may take tylenol 1,00058mnd ibuprofen 600m70mery 6 hours with food as needed for fever/chills, sore throat, aches/pains, and inflammation associated with viral illness. Take this with food to avoid stomach upset.    Your flu shot injection site  looks normal and without infection. Use warmth to the area to relax the muscle, you may also gently massage the area to help with pain.  If you develop any new or worsening symptoms, please return.  If your symptoms are severe, please go to the emergency room.  Follow-up with your primary care provider for further evaluation and management of your symptoms as well as ongoing wellness visits.  I hope you feel better!     ED Prescriptions     Medication Sig Dispense Auth. Provider   benzonatate (TESSALON) 100 MG capsule  (Status: Discontinued) Take 1 capsule (100 mg total) by mouth every 8 (eight) hours. 21 capsule Joella Prince M, FNP   benzonatate (TESSALON) 100 MG capsule Take 1 capsule (100 mg total) by mouth every 8 (eight) hours. 21 capsule Talbot Grumbling, FNP      PDMP not reviewed this encounter.   Talbot Grumbling, FNP 09/07/22 [place], [place] 09/07/22 1100

## 2022-09-08 LAB — SARS CORONAVIRUS 2 (TAT 6-24 HRS): SARS Coronavirus 2: NEGATIVE

## 2022-09-11 ENCOUNTER — Ambulatory Visit (INDEPENDENT_AMBULATORY_CARE_PROVIDER_SITE_OTHER): Payer: Medicaid Other | Admitting: Family Medicine

## 2022-09-13 ENCOUNTER — Encounter (INDEPENDENT_AMBULATORY_CARE_PROVIDER_SITE_OTHER): Payer: Self-pay | Admitting: Physician Assistant

## 2022-09-13 ENCOUNTER — Ambulatory Visit (INDEPENDENT_AMBULATORY_CARE_PROVIDER_SITE_OTHER): Payer: Medicaid Other | Admitting: Physician Assistant

## 2022-09-13 VITALS — BP 129/83 | HR 79 | Temp 98.4°F | Ht 62.0 in | Wt 253.0 lb

## 2022-09-13 DIAGNOSIS — E559 Vitamin D deficiency, unspecified: Secondary | ICD-10-CM | POA: Diagnosis not present

## 2022-09-13 DIAGNOSIS — E669 Obesity, unspecified: Secondary | ICD-10-CM | POA: Diagnosis not present

## 2022-09-13 DIAGNOSIS — E785 Hyperlipidemia, unspecified: Secondary | ICD-10-CM

## 2022-09-13 DIAGNOSIS — E1165 Type 2 diabetes mellitus with hyperglycemia: Secondary | ICD-10-CM

## 2022-09-13 DIAGNOSIS — Z6841 Body Mass Index (BMI) 40.0 and over, adult: Secondary | ICD-10-CM | POA: Diagnosis not present

## 2022-09-13 DIAGNOSIS — E1169 Type 2 diabetes mellitus with other specified complication: Secondary | ICD-10-CM

## 2022-09-13 DIAGNOSIS — E66813 Obesity, class 3: Secondary | ICD-10-CM

## 2022-09-13 DIAGNOSIS — Z7985 Long-term (current) use of injectable non-insulin antidiabetic drugs: Secondary | ICD-10-CM

## 2022-09-13 MED ORDER — OZEMPIC (0.25 OR 0.5 MG/DOSE) 2 MG/3ML ~~LOC~~ SOPN: 0.5000 mg | PEN_INJECTOR | SUBCUTANEOUS | 0 refills | Status: DC

## 2022-09-13 MED ORDER — ROSUVASTATIN CALCIUM 20 MG PO TABS: 20.0000 mg | ORAL_TABLET | Freq: Every day | ORAL | 2 refills | Status: DC

## 2022-09-14 ENCOUNTER — Ambulatory Visit (INDEPENDENT_AMBULATORY_CARE_PROVIDER_SITE_OTHER): Payer: Medicaid Other | Admitting: Family Medicine

## 2022-09-14 LAB — CMP14+EGFR
ALT: 12 IU/L (ref 0–32)
AST: 11 IU/L (ref 0–40)
Albumin/Globulin Ratio: 1.3 (ref 1.2–2.2)
Albumin: 3.9 g/dL (ref 3.9–4.9)
Alkaline Phosphatase: 77 IU/L (ref 44–121)
BUN/Creatinine Ratio: 10 (ref 9–23)
BUN: 9 mg/dL (ref 6–24)
Bilirubin Total: 0.4 mg/dL (ref 0.0–1.2)
CO2: 25 mmol/L (ref 20–29)
Calcium: 9.3 mg/dL (ref 8.7–10.2)
Chloride: 104 mmol/L (ref 96–106)
Creatinine, Ser: 0.89 mg/dL (ref 0.57–1.00)
Globulin, Total: 3.1 g/dL (ref 1.5–4.5)
Glucose: 85 mg/dL (ref 70–99)
Potassium: 4.5 mmol/L (ref 3.5–5.2)
Sodium: 140 mmol/L (ref 134–144)
Total Protein: 7 g/dL (ref 6.0–8.5)
eGFR: 79 mL/min/{1.73_m2} (ref >59)

## 2022-09-14 LAB — LIPID PANEL WITH LDL/HDL RATIO
Cholesterol, Total: 162 mg/dL (ref 100–199)
HDL: 57 mg/dL (ref >39)
LDL Chol Calc (NIH): 94 mg/dL (ref 0–99)
LDL/HDL Ratio: 1.6 ratio (ref 0.0–3.2)
Triglycerides: 53 mg/dL (ref 0–149)
VLDL Cholesterol Cal: 11 mg/dL (ref 5–40)

## 2022-09-14 LAB — INSULIN, RANDOM: INSULIN: 13.2 u[IU]/mL (ref 2.6–24.9)

## 2022-09-14 LAB — VITAMIN D 25 HYDROXY (VIT D DEFICIENCY, FRACTURES): Vit D, 25-Hydroxy: 39.9 ng/mL (ref 30.0–100.0)

## 2022-09-25 DIAGNOSIS — Z419 Encounter for procedure for purposes other than remedying health state, unspecified: Secondary | ICD-10-CM | POA: Diagnosis not present

## 2022-09-25 DIAGNOSIS — G4733 Obstructive sleep apnea (adult) (pediatric): Secondary | ICD-10-CM | POA: Diagnosis not present

## 2022-09-27 ENCOUNTER — Other Ambulatory Visit (INDEPENDENT_AMBULATORY_CARE_PROVIDER_SITE_OTHER): Payer: Self-pay | Admitting: Family Medicine

## 2022-09-27 DIAGNOSIS — E559 Vitamin D deficiency, unspecified: Secondary | ICD-10-CM

## 2022-09-28 ENCOUNTER — Other Ambulatory Visit (INDEPENDENT_AMBULATORY_CARE_PROVIDER_SITE_OTHER): Payer: Self-pay | Admitting: Physician Assistant

## 2022-09-28 ENCOUNTER — Other Ambulatory Visit: Payer: Self-pay | Admitting: Student

## 2022-09-28 DIAGNOSIS — G8929 Other chronic pain: Secondary | ICD-10-CM

## 2022-09-28 DIAGNOSIS — E1165 Type 2 diabetes mellitus with hyperglycemia: Secondary | ICD-10-CM

## 2022-09-28 MED ORDER — ACETAMINOPHEN 325 MG PO TABS: 650.0000 mg | ORAL_TABLET | Freq: Three times a day (TID) | ORAL | 0 refills | Status: AC | PRN

## 2022-10-03 ENCOUNTER — Encounter: Payer: Medicaid Other | Admitting: Neurology

## 2022-10-05 NOTE — Progress Notes (Signed)
Patient called and aware. No change in plans

## 2022-10-07 NOTE — Progress Notes (Unsigned)
Chief Complaint:   OBESITY [name] [name] is here to discuss her progress with her obesity treatment plan along with follow-up of her obesity related diagnoses. [name] [name] is on the Category 3 Plan and states she is following her eating plan approximately 80% of the time. [name] [name] states she is walking 30 minutes 3 times per week.  Today's visit was #: 8 Starting weight: 267 lbs Starting date: 03/16/2022 Today's weight: 253 lbs Today's date: 09/13/2022 Total lbs lost to date: 14 Total lbs lost since last in-office visit: 4  Interim History: [name] [name] has done well with weight loss. Her hunger is controlled on Ozempic. She is not skipping meals.  Reports her appetite is generally appropriate, and she is able to eat, but does have to "force" herself to eat at times.  Her regular soda consumption is now down to ~12 oz daily. We discussed trying Crystal light. Pt reports some increased cravings for sweets since starting Ozempic.  Subjective:   1. Type 2 diabetes mellitus with hyperglycemia, without long-term current use of insulin (HCC) [name] [name]'s A1c was down to 5.7 from 6.8 on 09/04/2022. She started Ozempic  and is tolerating Ozempic 0.5 mg weekly well. She reports some constipation when she started Ozempic, but this has since resolved. Pt with decreased hunger. She does have to force herself to eat at times, but is not skipping meals.  2. Hyperlipidemia associated with type 2 diabetes mellitus ([place]) [name] [name] ran out of Crestor and needs a refill. She denies side effects. Pt is working on decreasing simple carbs, saturated fats, and increasing lean protein in diet and exercise to promote weight loss.  3. Vitamin D deficiency Pt's last Vitamin D level was 22.4 on 03/20/2022. She is taking Ergocalciferol weekly with no side effects.   Assessment/Plan:   1. Type 2 diabetes mellitus with hyperglycemia, without long-term current use of insulin (HCC) Continue Ozempic 0.5 mg. Continue healthy eating  plan and exercise.   Refill- Semaglutide,0.25 or 0.5MG /DOS, (OZEMPIC, 0.25 OR 0.5 MG/DOSE,) 2 MG/3ML SOPN; Inject 0.5 mg into the skin once a week.  Dispense: 3 mL; Refill: 0  Lab/Orders today: - CMP14+EGFR - Insulin, random  2. Hyperlipidemia associated with type 2 diabetes mellitus (HCC) Continue Crestor. Continue healthy eating plan and exercise.  Lab/Orders today: - Lipid Panel With LDL/HDL Ratio Refill- rosuvastatin (CRESTOR) 20 MG tablet; Take 1 tablet (20 mg total) by mouth daily.  Dispense: 90 tablet; Refill: 2  3. Vitamin D deficiency Low Vitamin D level contributes to fatigue and are associated with obesity, breast, and colon cancer. She agrees to continue to take prescription Vitamin D 50,000 IU every week and will follow-up for routine testing of Vitamin D, at least 2-3 times per year to avoid over-replacement. Lab/Orders today: - VITAMIN D 25 Hydroxy (Vit-D Deficiency, Fractures)  4. Obesity with current BMI of 46.3 [name] [name] is currently in the action stage of change. As such, her goal is to continue with weight loss efforts. She has agreed to the Category 3 Plan.   Exercise goals:  As is  Behavioral modification strategies: increasing lean protein intake and decreasing simple carbohydrates.  [name] [name] has agreed to follow-up with our clinic in 4 weeks with Dr. Jearld Shines. She was informed of the importance of frequent follow-up visits to maximize her success with intensive lifestyle modifications for her multiple health conditions.   [name] [name] was informed we would discuss her lab results at her next visit unless there is a critical issue that needs to be addressed sooner.  [name] [name] agreed to keep her next visit at the agreed upon time to discuss these results.  Objective:   Blood pressure 129/83, pulse 79, temperature 98.4 F (36.9 C), height 5\' 2"  (1.575 m), weight 253 lb (114.8 kg), SpO2 97 %. Body mass index is 46.27 kg/m.  General: Cooperative, alert, well developed, in  no acute distress. HEENT: Conjunctivae and lids unremarkable. Cardiovascular: Regular rhythm.  Lungs: Normal work of breathing. Neurologic: No focal deficits.   Lab Results  Component Value Date   CREATININE 0.89 09/13/2022   BUN 9 09/13/2022   NA 140 09/13/2022   K 4.5 09/13/2022   CL 104 09/13/2022   CO2 25 09/13/2022   Lab Results  Component Value Date   ALT 12 09/13/2022   AST 11 09/13/2022   ALKPHOS 77 09/13/2022   BILITOT 0.4 09/13/2022   Lab Results  Component Value Date   HGBA1C 5.7 (A) 09/04/2022   HGBA1C 6.8 (H) 03/20/2022   HGBA1C 6.3 (A) 11/23/2021   HGBA1C 6.7 (A) 08/03/2021   HGBA1C 7.0 (A) 12/09/2020   Lab Results  Component Value Date   INSULIN 13.2 09/13/2022   INSULIN 20.0 03/20/2022   Lab Results  Component Value Date   TSH 2.620 03/20/2022   Lab Results  Component Value Date   CHOL 162 09/13/2022   HDL 57 09/13/2022   LDLCALC 94 09/13/2022   TRIG 53 09/13/2022   CHOLHDL 2.9 08/03/2021   Lab Results  Component Value Date   VD25OH 39.9 09/13/2022   VD25OH 22.4 (L) 03/20/2022   Lab Results  Component Value Date   WBC 7.3 03/20/2022   HGB 12.5 03/20/2022   HCT 40.3 03/20/2022   MCV 83 03/20/2022   PLT 390 03/20/2022   Lab Results  Component Value Date   IRON 36 06/27/2016   TIBC 294 06/27/2016   FERRITIN 140 12/09/2020    Attestation Statements:   Reviewed by clinician on day of visit: allergies, medications, problem list, medical history, surgical history, family history, social history, and previous encounter notes.  I, Kathlene November, BS, CMA, am acting as transcriptionist for [employer] Rayburn, PA-C.  I have reviewed the above documentation for accuracy and completeness, and I agree with the above. -  RAYBURN,SHAWN,PA-C

## 2022-10-10 ENCOUNTER — Encounter: Payer: Medicaid Other | Admitting: Neurology

## 2022-10-10 NOTE — Progress Notes (Deleted)
Patient: [name] [name] Date of Birth: 11-10-1970  Reason for Visit: Follow up History from: Patient Primary Neurologist: Rexene Alberts    ASSESSMENT AND PLAN 52 y.o. year old female    HISTORY OF PRESENT ILLNESS: Today 10/10/22 Patient here today for CPAP visit.  Was seen by Dr. Rexene Alberts in September 2023, was already using CPAP.  An HST was ordered, but was denied by her insurance.  A new CPAP machine was ordered.  HISTORY Dr. Rexene Alberts 05/16/22: I saw your patient, [name] [name], upon your kind request in my sleep clinic today for initial consultation of her sleep disorder, in particular, evaluation of her prior diagnosis of obstructive sleep apnea.  The patient is unaccompanied today.  As you know, [name] [name] is a 52 year old right-handed woman with an underlying medical history of diabetes, hypertension, hyperlipidemia, migraine headaches, arthritis, anemia, asthma, low back pain, and severe obesity with a BMI of over 45, who was previously diagnosed with obstructive sleep apnea and placed on CPAP therapy.  I reviewed your office note from 04/03/2022.  She had sleep testing on 08/07/2016.  I was able to review her sleep study results.  She had a split-night sleep study and was laying on hospital sleep lab.  Baseline AHI was 15.8/h.  O2 nadir was 86%.  She was titrated from 5 cm to 10 cm at the time.  She was set up on CPAP of 10 cm in January 2018.  I was able to review her latest compliance data for the past 90 days from 02/15/2022 through 05/15/2022, during which time she used her machine 36 out of 90 days with percent use days greater than 4 hours of 28%, indicating suboptimal compliance, average AHI 1.4/h, leak acceptable with the 95th percentile at 9.8 L/min on a pressure of 10 cm with EPR of 2.  Her DME company is [employer].  Her Epworth sleepiness score is 6 out of 24, fatigue severity score is 24 out of 63.  Her mom has sleep apnea and has a CPAP machine.  Patient has been working on weight  loss and has been going to the weight management clinic for the past nearly 3 months.  She has been on Ozempic for about 2 months.  She has drastically reduced her caffeine intake from up to 2 2-liter bottles of soda per day to down to 1-1/2 cans of soda per day.  Interestingly, she has had increase in headaches in the past month and a half it may correlate with her drastic reduction of her caffeine.  She has not been consistent with her CPAP as she felt the headaches were coming from her CPAP machine.  When she first got on her machine she did notice benefit from the treatment.  Her weight was indeed higher in the past when she was first diagnosed with sleep apnea.  Bedtime is generally between 9:30 PM and 10 PM.  Rise time is between 5 and 5:30 AM.  She works as a International aid/development worker, lives with her 9 year old son.  She has a boyfriend who stays over from time to time.  She is not a smoker and does not currently drink any alcohol.  She has had trouble with her mask, it does not fit as well.  She has not been in touch with her DME company which is [employer] about this.  She has nocturia about 2-3 times per average night.  She has a TV in her bedroom but does not have it on all  night.  They have no pets in the household.    REVIEW OF SYSTEMS: Out of a complete 14 system review of symptoms, the patient complains only of the following symptoms, and all other reviewed systems are negative.  See HPI  ALLERGIES: Allergies  Allergen Reactions   Cat Hair Extract Anaphylaxis   Clorox Nasal Antiseptic [Povidone-Iodine]     Chlorox rash.   Mushroom Extract Complex Hives and Itching    Mushroom as a whole the pt is allergic to.    HOME MEDICATIONS: Outpatient Medications Prior to Visit  Medication Sig Dispense Refill   Accu-Chek Softclix Lancets lancets Use as instructed 100 each 12   acetaminophen (TYLENOL) 325 MG tablet Take 2 tablets (650 mg total) by mouth every 8 (eight) hours as needed. 30 tablet 0    albuterol (PROAIR HFA) 108 (90 Base) MCG/ACT inhaler Inhale 1-2 puffs into the lungs every 6 (six) hours as needed for wheezing or shortness of breath. 8 g 2   benzonatate (TESSALON) 100 MG capsule Take 1 capsule (100 mg total) by mouth every 8 (eight) hours. 21 capsule 0   Blood Glucose Monitoring Suppl (ACCU-CHEK GUIDE) w/Device KIT Use to test blood sugar in the morning, Dx E11.9 1 kit 0   gabapentin (NEURONTIN) 300 MG capsule Take 1 capsule (300 mg total) by mouth at bedtime. 90 capsule 2   glucose blood (ACCU-CHEK GUIDE) test strip USE AS DIRECTED 100 each 0   ibuprofen (ADVIL) 400 MG tablet Take 2 tablets (800 mg total) by mouth every 8 (eight) hours as needed. 100 tablet 2   rosuvastatin (CRESTOR) 20 MG tablet Take 1 tablet (20 mg total) by mouth daily. 90 tablet 2   Semaglutide,0.25 or 0.5MG/DOS, (OZEMPIC, 0.25 OR 0.5 MG/DOSE,) 2 MG/3ML SOPN Inject 0.5 mg into the skin once a week. 3 mL 0   Vitamin D, Ergocalciferol, (DRISDOL) 1.25 MG (50000 UNIT) CAPS capsule Take 1 capsule (50,000 Units total) by mouth every 7 (seven) days. 4 capsule 0   No facility-administered medications prior to visit.    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   Over weight    Pneumonia 2006   Post viral syndrome 07/17/2019   Renal insufficiency 06/18/2019   Sleep apnea    CPAP   SOB (shortness of breath)    Stress at home    "and at work"    PAST SURGICAL HISTORY: Past Surgical History:  Procedure Laterality Date   BREAST  BIOPSY Left 07/29/2013   CESAREAN SECTION  08/2008   CHOLECYSTECTOMY  03/02/2012   Procedure: LAPAROSCOPIC CHOLECYSTECTOMY;  Surgeon: Gwenyth Ober, MD;  Location: [facility];  Service: General;;   KNEE ARTHROSCOPY WITH MENISCAL REPAIR Right 06/25/2018   Procedure: RIGHT KNEE ARTHROSCOPY WITH MENISCAL ROOT REPAIR;  Surgeon: Meredith Pel, MD;  Location: [facility];  Service: Orthopedics;  Laterality:  Right;   KNEE ARTHROSCOPY WITH MENISCAL REPAIR Left 03/09/2020   Procedure: LEFT KNEE ARTHROSCOPY, MEDIAL MENISCAL ROOT REPAIR;  Surgeon: Meredith Pel, MD;  Location: [facility];  Service: Orthopedics;  Laterality: Left;   WISDOM TOOTH EXTRACTION      FAMILY HISTORY: Family History  Problem Relation Age of Onset   High blood pressure Mother    Thyroid disease Mother    Sleep apnea Mother    Obesity Mother    Cervical cancer Maternal Grandmother    Cancer Other    Breast cancer Neg Hx    Colon cancer Neg Hx    Colon polyps Neg Hx    Crohn's disease Neg Hx    Esophageal cancer Neg Hx    Rectal cancer Neg Hx    Stomach cancer Neg Hx     SOCIAL HISTORY: Social History   Socioeconomic History   Marital status: Single    Spouse name: Not on file   Number of children: Not on file   Years of education: Not on file   Highest education level: Not on file  Occupational History   Not on file  Tobacco Use   Smoking status: Never    Passive exposure: Current   Smokeless tobacco: Never  Vaping Use   Vaping Use: Never used  Substance and Sexual Activity   Alcohol use: No    Alcohol/week: 0.0 standard drinks of alcohol   Drug use: No   Sexual activity: Not on file  Other Topics Concern   Not on file  Social History Narrative   Caffiene, 1 cup coffee in am, soda's - 1-2 can daily.   Education :  11th grade.   Working; Teacher, early years/pre.    Social Determinants of Health   Financial Resource Strain: Not on file  Food Insecurity: Not on file  Transportation Needs: Not on file   Physical Activity: Not on file  Stress: Not on file  Social Connections: Not on file  Intimate Partner Violence: Not on file    PHYSICAL EXAM  There were no vitals filed for this visit. There is no height or weight on file to calculate BMI.  Generalized: Well developed, in no acute distress  Neurological examination  Mentation: Alert oriented to time, place, history taking. Follows all commands speech and language fluent Cranial nerve II-XII: Pupils were equal round reactive to light. Extraocular movements were full, visual field were full on confrontational test. Facial sensation and strength were normal. Uvula tongue midline. Head turning and shoulder shrug  were normal and symmetric. Motor: The motor testing reveals 5 over 5 strength of all 4 extremities. Good symmetric motor tone is noted throughout.  Sensory: Sensory testing is intact to soft touch on all 4 extremities. No evidence of extinction is noted.  Coordination: Cerebellar testing reveals good finger-nose-finger and heel-to-shin bilaterally.  Gait and station: Gait is normal. Tandem gait is normal. Romberg is negative. No drift is seen.  Reflexes: Deep tendon reflexes are symmetric and normal bilaterally.   DIAGNOSTIC DATA (LABS, IMAGING, TESTING) - I reviewed patient records, labs, notes, testing and imaging myself where available.  Lab Results  Component Value Date   WBC 7.3 03/20/2022   HGB 12.5 03/20/2022   HCT 40.3 03/20/2022   MCV 83 03/20/2022   PLT 390 03/20/2022      Component Value Date/Time   NA 140 09/13/2022 1127   K 4.5 09/13/2022 1127   CL 104 09/13/2022 1127   CO2 25 09/13/2022 1127  GLUCOSE 85 09/13/2022 1127   GLUCOSE 126 (H) 03/05/2020 0924   BUN 9 09/13/2022 1127   CREATININE 0.89 09/13/2022 1127   CALCIUM 9.3 09/13/2022 1127   PROT 7.0 09/13/2022 1127   ALBUMIN 3.9 09/13/2022 1127   AST 11 09/13/2022 1127   ALT 12 09/13/2022 1127   ALKPHOS 77 09/13/2022 1127   BILITOT 0.4 09/13/2022  1127   GFRNONAA >60 03/05/2020 0924   GFRAA >60 03/05/2020 0924   Lab Results  Component Value Date   CHOL 162 09/13/2022   HDL 57 09/13/2022   LDLCALC 94 09/13/2022   TRIG 53 09/13/2022   CHOLHDL 2.9 08/03/2021   Lab Results  Component Value Date   HGBA1C 5.7 (A) 09/04/2022   No results found for: "VITAMINB12" Lab Results  Component Value Date   TSH 2.620 03/20/2022    Butler Denmark, AGNP-C, DNP 10/10/2022, 5:42 AM [facility] [address], [place], [place] [address] [phone]

## 2022-10-12 ENCOUNTER — Telehealth (INDEPENDENT_AMBULATORY_CARE_PROVIDER_SITE_OTHER): Payer: Medicaid Other | Admitting: Family Medicine

## 2022-10-12 ENCOUNTER — Encounter (INDEPENDENT_AMBULATORY_CARE_PROVIDER_SITE_OTHER): Payer: Self-pay | Admitting: Family Medicine

## 2022-10-12 DIAGNOSIS — Z7985 Long-term (current) use of injectable non-insulin antidiabetic drugs: Secondary | ICD-10-CM

## 2022-10-12 DIAGNOSIS — E1165 Type 2 diabetes mellitus with hyperglycemia: Secondary | ICD-10-CM

## 2022-10-12 DIAGNOSIS — E669 Obesity, unspecified: Secondary | ICD-10-CM

## 2022-10-12 DIAGNOSIS — E559 Vitamin D deficiency, unspecified: Secondary | ICD-10-CM | POA: Diagnosis not present

## 2022-10-12 DIAGNOSIS — Z6841 Body Mass Index (BMI) 40.0 and over, adult: Secondary | ICD-10-CM | POA: Diagnosis not present

## 2022-10-12 DIAGNOSIS — E1169 Type 2 diabetes mellitus with other specified complication: Secondary | ICD-10-CM | POA: Diagnosis not present

## 2022-10-12 DIAGNOSIS — E66813 Obesity, class 3: Secondary | ICD-10-CM

## 2022-10-12 DIAGNOSIS — E785 Hyperlipidemia, unspecified: Secondary | ICD-10-CM

## 2022-10-12 MED ORDER — VITAMIN D (ERGOCALCIFEROL) 1.25 MG (50000 UNIT) PO CAPS: 50000.0000 [IU] | ORAL_CAPSULE | ORAL | 0 refills | Status: DC

## 2022-10-12 MED ORDER — OZEMPIC (0.25 OR 0.5 MG/DOSE) 2 MG/3ML ~~LOC~~ SOPN: 0.5000 mg | PEN_INJECTOR | SUBCUTANEOUS | 0 refills | Status: DC

## 2022-10-12 NOTE — Progress Notes (Signed)
TeleHealth Visit:  This visit was completed with telemedicine (audio/video) technology. [name] [name] has verbally consented to this TeleHealth visit. The patient is located at home, the provider is located at home. The participants in this visit include the listed provider and patient. The visit was conducted today via MyChart video.  OBESITY [name] [name] is here to discuss her progress with her obesity treatment plan along with follow-up of her obesity related diagnoses.   Today's visit was # 9 Starting weight: 267 lbs Starting date: 03/16/2022 Weight at last in office visit: 253 lbs on 09/13/22 Total weight loss: 14 lbs at last in office visit on 09/13/22. Today's reported weight:  No weight reported.   Nutrition Plan: the Category 3 Plan - 87% adherence.  Current exercise: none   Interim History:  [name] [name] reports better plan adherence and better protein intake since her last office visit.  Sometimes has tuna and crackers at lunch.  She is avoiding fried foods.  Occasionally has a carb with dinner but mostly protein and vegetables. Not skipping meals. Estimates she is drinking about 16 ounces of soda per day and 8 to 16 ounces of water per day.  She has tried 0-calorie sodas but so far does not like them because of the aftertaste.  She has not been walking because of the cold.  Assessment/Plan:  We discussed recent lab results in depth.  1. Type II Diabetes HgbA1c is at goal. Last A1c was 5.7.  A1c was 6.8 when she started our program in June 2023. Medication(s): Ozempic 0.5 mg weekly.  Appetite well-controlled.  Has mild constipation day after her injection which quickly subsides.  Lab Results  Component Value Date   HGBA1C 5.7 (A) 09/04/2022   HGBA1C 6.8 (H) 03/20/2022   HGBA1C 6.3 (A) 11/23/2021   Lab Results  Component Value Date   LDLCALC 94 09/13/2022   CREATININE 0.89 09/13/2022    Plan: Refill Ozempic 0.5 mg weekly.   2. Vitamin D Deficiency Vitamin D level  has improved but is not at goal.  Currently 39.9, up from 22.4. She is on weekly prescription Vitamin D 50,000 IU.  Lab Results  Component Value Date   VD25OH 39.9 09/13/2022   VD25OH 22.4 (L) 03/20/2022    Plan: Continue and refill prescription vitamin D 50,000 IU weekly.   3. Hyperlipidemia associated with type 2 diabetes Slightly above goal at 94.  Improved from 100 in June 2023.  HDL and triglycerides normal. Medication(s): Crestor 20 mg daily.  Tolerating well.  She asked if she could discontinue the Crestor since her LDL was normal. Cardiovascular risk factors: diabetes mellitus, dyslipidemia, obesity (BMI >= 30 kg/m2), and sedentary lifestyle  Lab Results  Component Value Date   CHOL 162 09/13/2022   HDL 57 09/13/2022   LDLCALC 94 09/13/2022   TRIG 53 09/13/2022   CHOLHDL 2.9 08/03/2021   Lab Results  Component Value Date   ALT 12 09/13/2022   AST 11 09/13/2022   ALKPHOS 77 09/13/2022   BILITOT 0.4 09/13/2022   The 10-year ASCVD risk score (Arnett DK, et al., 2019) is: 3.7%   Values used to calculate the score:     Age: 52 years     Sex: Female     Is Non-Hispanic African American: Yes     Diabetic: Yes     Tobacco smoker: No     Systolic Blood Pressure: 425 mmHg     Is BP treated: No     HDL Cholesterol: 57 mg/dL  Total Cholesterol: 162 mg/dL  Plan: Advised her we should continue Crestor to reduce risk of heart attack and stroke.   Continue Crestor 20 mg daily.   4. Obesity: Current BMI 46.3 [name] [name] is currently in the action stage of change. As such, her goal is to continue with weight loss efforts.  She has agreed to the Category 3 Plan.   1.  Reduce soda to 1 mini can per day.. 2.  If having tuna for lunch, have 4 oz ounces of tuna and 90 to 100 cal worth of crackers. 3.  Avoid carbs at dinner.  Exercise goals: Encouraged her to resume walking when weather allows.  Behavioral modification strategies: increasing lean protein intake, decreasing  simple carbohydrates, increasing water intake, decreasing liquid calories, meal planning and cooking strategies, and planning for success.  [name] [name] has agreed to follow-up with our clinic in 3-4 weeks.   No orders of the defined types were placed in this encounter.   Medications Discontinued During This Encounter  Medication Reason   Vitamin D, Ergocalciferol, (DRISDOL) 1.25 MG (50000 UNIT) CAPS capsule Reorder   Semaglutide,0.25 or 0.5MG /DOS, (OZEMPIC, 0.25 OR 0.5 MG/DOSE,) 2 MG/3ML SOPN Reorder     Meds ordered this encounter  Medications   Vitamin D, Ergocalciferol, (DRISDOL) 1.25 MG (50000 UNIT) CAPS capsule    Sig: Take 1 capsule (50,000 Units total) by mouth every 7 (seven) days.    Dispense:  4 capsule    Refill:  0    Order Specific Question:   Supervising Provider    Answer:   Dell Ponto [[id number]]   Semaglutide,0.25 or 0.5MG /DOS, (OZEMPIC, 0.25 OR 0.5 MG/DOSE,) 2 MG/3ML SOPN    Sig: Inject 0.5 mg into the skin once a week.    Dispense:  3 mL    Refill:  0    Order Specific Question:   Supervising Provider    Answer:   Dell Ponto [[id number]]      Objective:   VITALS: Per patient if applicable, see vitals. GENERAL: Alert and in no acute distress. CARDIOPULMONARY: No increased WOB. Speaking in clear sentences.  PSYCH: Pleasant and cooperative. Speech normal rate and rhythm. Affect is appropriate. Insight and judgement are appropriate. Attention is focused, linear, and appropriate.  NEURO: Oriented as arrived to appointment on time with no prompting.   Lab Results  Component Value Date   CREATININE 0.89 09/13/2022   BUN 9 09/13/2022   NA 140 09/13/2022   K 4.5 09/13/2022   CL 104 09/13/2022   CO2 25 09/13/2022   Lab Results  Component Value Date   ALT 12 09/13/2022   AST 11 09/13/2022   ALKPHOS 77 09/13/2022   BILITOT 0.4 09/13/2022   Lab Results  Component Value Date   HGBA1C 5.7 (A) 09/04/2022   HGBA1C 6.8 (H) 03/20/2022   HGBA1C 6.3 (A) 11/23/2021    HGBA1C 6.7 (A) 08/03/2021   HGBA1C 7.0 (A) 12/09/2020   Lab Results  Component Value Date   INSULIN 13.2 09/13/2022   INSULIN 20.0 03/20/2022   Lab Results  Component Value Date   TSH 2.620 03/20/2022   Lab Results  Component Value Date   CHOL 162 09/13/2022   HDL 57 09/13/2022   LDLCALC 94 09/13/2022   TRIG 53 09/13/2022   CHOLHDL 2.9 08/03/2021   Lab Results  Component Value Date   WBC 7.3 03/20/2022   HGB 12.5 03/20/2022   HCT 40.3 03/20/2022   MCV 83 03/20/2022   PLT 390 03/20/2022  Lab Results  Component Value Date   IRON 36 06/27/2016   TIBC 294 06/27/2016   FERRITIN 140 12/09/2020   Lab Results  Component Value Date   VD25OH 39.9 09/13/2022   VD25OH 22.4 (L) 03/20/2022    Attestation Statements:   Reviewed by clinician on day of visit: allergies, medications, problem list, medical history, surgical history, family history, social history, and previous encounter notes.

## 2022-10-25 ENCOUNTER — Telehealth: Payer: Self-pay | Admitting: Neurology

## 2022-10-25 ENCOUNTER — Emergency Department (HOSPITAL_COMMUNITY)
Admission: EM | Admit: 2022-10-25 | Discharge: 2022-10-25 | Disposition: A | Discharge status: Home / Self Care | Payer: Medicaid Other | Attending: Emergency Medicine | Admitting: Emergency Medicine

## 2022-10-25 DIAGNOSIS — Z1152 Encounter for screening for COVID-19: Secondary | ICD-10-CM | POA: Diagnosis not present

## 2022-10-25 DIAGNOSIS — J069 Acute upper respiratory infection, unspecified: Secondary | ICD-10-CM

## 2022-10-25 DIAGNOSIS — R0981 Nasal congestion: Secondary | ICD-10-CM | POA: Diagnosis present

## 2022-10-25 DIAGNOSIS — B9789 Other viral agents as the cause of diseases classified elsewhere: Secondary | ICD-10-CM | POA: Diagnosis not present

## 2022-10-25 LAB — GROUP A STREP BY PCR: Group A Strep by PCR: NOT DETECTED

## 2022-10-25 LAB — RESP PANEL BY RT-PCR (RSV, FLU A&B, COVID)  RVPGX2
Influenza A by PCR: NEGATIVE
Influenza B by PCR: NEGATIVE
Resp Syncytial Virus by PCR: NEGATIVE
SARS Coronavirus 2 by RT PCR: NEGATIVE

## 2022-10-25 MED ORDER — ACETAMINOPHEN 325 MG PO TABS
650.0000 mg | ORAL_TABLET | Freq: Once | ORAL | Status: AC
  Administered 2022-10-25: 650 mg via ORAL
  Filled 2022-10-25: qty 2

## 2022-10-25 MED ORDER — BENZONATATE 100 MG PO CAPS: 100.0000 mg | ORAL_CAPSULE | Freq: Three times a day (TID) | ORAL | 0 refills | Status: DC | PRN

## 2022-10-25 NOTE — Discharge Instructions (Addendum)
It was a pleasure taking care of you!   You have pending results for COVID, flu, RSV, strep.  You will be called and notified of any findings.  You may also see your results on your MyChart.  Depending on the results you will be sent medication to your pharmacy.  You may use your albuterol inhaler at home as directed as needed for your symptoms.  You may take over-the-counter cough and cold medications as needed for your symptoms. For your cough, you will be sent a prescription called Tessalon perles, take as directed. Ensure to maintain fluid intake with tea, soup, broth, Pedialyte, Gatorade, water.  You may follow-up with your primary care provider as needed.  Return to the Emergency Department if you are experiencing increasing/worsening symptoms.

## 2022-10-25 NOTE — ED Provider Notes (Signed)
[facility] Provider Note   CSN: [id number] Arrival date & time: 10/25/22  1159     History  Chief Complaint  Patient presents with   URI   Sore Throat    [name] [name] is a 52 y.o. female who presents to the ED with concerns for URI like symptoms x 2 days. Has sick contacts at [employer]. Tried OTC meds for her symptoms. Has associated painful swallowing, cough, rhinorrhea, nasal congestion, generalized fatigue.  Denies trouble breathing, trouble swallowing.   The history is provided by the patient. No language interpreter was used.       Home Medications Prior to Admission medications   Medication Sig Start Date End Date Taking? Authorizing Provider  Accu-Chek Softclix Lancets lancets Use as instructed 04/20/21   Riesa Pope, MD  acetaminophen (TYLENOL) 325 MG tablet Take 2 tablets (650 mg total) by mouth every 8 (eight) hours as needed. 09/28/22 10/28/22  Romana Juniper, MD  albuterol (PROAIR HFA) 108 (90 Base) MCG/ACT inhaler Inhale 1-2 puffs into the lungs every 6 (six) hours as needed for wheezing or shortness of breath. 06/29/20   [place], Carley D, DO  benzonatate (TESSALON) 100 MG capsule Take 1 capsule (100 mg total) by mouth every 8 (eight) hours as needed for cough. 10/25/22   Alistar Mcenery A, PA-C  Blood Glucose Monitoring Suppl (ACCU-CHEK GUIDE) w/Device KIT Use to test blood sugar in the morning, Dx E11.9 01/11/21   Iona Beard, MD  gabapentin (NEURONTIN) 300 MG capsule Take 1 capsule (300 mg total) by mouth at bedtime. 09/04/22 09/04/23  Romana Juniper, MD  glucose blood (ACCU-CHEK GUIDE) test strip USE AS DIRECTED 02/02/22   Timothy Lasso, MD  ibuprofen (ADVIL) 400 MG tablet Take 2 tablets (800 mg total) by mouth every 8 (eight) hours as needed. 09/04/22 09/04/23  Romana Juniper, MD  rosuvastatin (CRESTOR) 20 MG tablet Take 1 tablet (20 mg total) by mouth daily. 09/13/22 06/10/23   Rayburn, Neta Mends, PA-C  Semaglutide,0.25 or 0.5MG /DOS, (OZEMPIC, 0.25 OR 0.5 MG/DOSE,) 2 MG/3ML SOPN Inject 0.5 mg into the skin once a week. 10/12/22   [place]mire, Joneen Boers, FNP  Vitamin D, Ergocalciferol, (DRISDOL) 1.25 MG (50000 UNIT) CAPS capsule Take 1 capsule (50,000 Units total) by mouth every 7 (seven) days. 10/12/22   Whitmire, Joneen Boers, FNP      Allergies    Cat hair extract, Clorox nasal antiseptic [povidone-iodine], and Mushroom extract complex    Review of Systems   Review of Systems  All other systems reviewed and are negative.   Physical Exam Updated Vital Signs BP 117/88 (BP Location: Left Arm)   Pulse 90   Temp 97.9 F (36.6 C) (Oral)   Resp 18   SpO2 100%  Physical Exam Vitals and nursing note reviewed.  Constitutional:      General: She is not in acute distress.    Appearance: Normal appearance.  HENT:     Mouth/Throat:     Mouth: Mucous membranes are moist.     Pharynx: Oropharynx is clear. Uvula midline. No oropharyngeal exudate, posterior oropharyngeal erythema or uvula swelling.     Tonsils: No tonsillar exudate.     Comments: Uvula midline without swelling. No posterior pharyngeal erythema or tonsillar exudate noted. Patent airway. Pt able to speak in clear complete sentences. Tolerating oral secretions. Eyes:     General: No scleral icterus.    Extraocular Movements: Extraocular movements intact.  Cardiovascular:     Rate and  Rhythm: Normal rate.  Pulmonary:     Effort: Pulmonary effort is normal. No respiratory distress.  Abdominal:     Palpations: Abdomen is soft. There is no mass.     Tenderness: There is no abdominal tenderness.  Musculoskeletal:        General: Normal range of motion.     Cervical back: Neck supple.  Skin:    General: Skin is warm and dry.     Findings: No rash.  Neurological:     Mental Status: She is alert.     Sensory: Sensation is intact.     Motor: Motor function is intact.  Psychiatric:        Behavior:  Behavior normal.     ED Results / Procedures / Treatments   Labs (all labs ordered are listed, but only abnormal results are displayed) Labs Reviewed  RESP PANEL BY RT-PCR (RSV, FLU A&B, COVID)  RVPGX2  GROUP A STREP BY PCR    EKG None  Radiology No results found.  Procedures Procedures    Medications Ordered in ED Medications  acetaminophen (TYLENOL) tablet 650 mg (650 mg Oral Given 10/25/22 1248)    ED Course/ Medical Decision Making/ A&P Clinical Course as of 10/25/22 1602  Wed Oct 25, 2022  1602 Attempted to notify patient of negative swab results.  No answer at this time.  Patient was aware prior to discharge to check her MyChart for her swab results. [SB]    Clinical Course User Index [SB] Willi Borowiak A, PA-C                             Medical Decision Making Risk OTC drugs. Prescription drug management.   Pt presents with concerns for URI like symptoms onset 2 days. Tried OTC meds for her symptoms. Vital signs, patient afebrile. On exam, pt with uvula midline without swelling.  Able to speak in clear complete sentences. No acute cardiovascular, respiratory, abdominal exam findings. Differential diagnosis includes COVID, flu, RSV, viral pharyngitis, strep pharyngitis, viral URI with cough, PNA.    Labs:  I ordered, and personally interpreted labs.  The pertinent results include:   COVID swab negative Flu swab negative Strep swab negative RSV swab negative  Medications:  I ordered medication including tylenol for symptom management Reevaluation of the patient after these medicines and interventions, I reevaluated the patient and found that they have improved I have reviewed the patients home medicines and have made adjustments as needed   Disposition: Presentation suspicious for viral URI with cough and viral pharyngitis at this time. Doubt concerns at this time for COVID, Flu, RSV, strep pharyngitis. After consideration of the diagnostic results and  the patients response to treatment, I feel that the patient would benefit from Discharge home. Tessalon perles Rx sent. Work note provided. Supportive care measures and strict return precautions discussed with patient at bedside. Pt acknowledges and verbalizes understanding. Pt appears safe for discharge. Follow up as indicated in discharge paperwork.    This chart was dictated using voice recognition software, Dragon. Despite the best efforts of this provider to proofread and correct errors, errors may still occur which can change documentation meaning.  Final Clinical Impression(s) / ED Diagnoses Final diagnoses:  Viral URI with cough    Rx / DC Orders ED Discharge Orders          Ordered    benzonatate (TESSALON) 100 MG capsule  Every 8 hours PRN  10/25/22 1253              Daschel Roughton A, PA-C 10/25/22 1602    Audley Hose, MD 10/26/22 [phone]

## 2022-10-25 NOTE — ED Triage Notes (Signed)
Pt c/o URI symptoms since Monday with cough, congestion, malaise, fever, sore throat.

## 2022-10-26 ENCOUNTER — Telehealth (INDEPENDENT_AMBULATORY_CARE_PROVIDER_SITE_OTHER): Payer: Medicaid Other | Admitting: Neurology

## 2022-10-26 ENCOUNTER — Ambulatory Visit: Payer: Medicaid Other | Admitting: Neurology

## 2022-10-26 DIAGNOSIS — Z419 Encounter for procedure for purposes other than remedying health state, unspecified: Secondary | ICD-10-CM | POA: Diagnosis not present

## 2022-10-26 DIAGNOSIS — G4733 Obstructive sleep apnea (adult) (pediatric): Secondary | ICD-10-CM

## 2022-10-26 NOTE — Patient Instructions (Signed)
I will order for your DME to refit you for full facemask Recommend using CPAP nightly for greater than 4 hours We will follow-up in 6 months, sooner if needed

## 2022-10-26 NOTE — Progress Notes (Signed)
Virtual Visit via Video Note  I connected with [name] [name] on 10/26/22 at 10:15 AM EST by a video enabled telemedicine application and verified that I am speaking with the correct person using two identifiers.  Location: Patient: at her home Provider: in the office    I discussed the limitations of evaluation and management by telemedicine and the availability of in person appointments. The patient expressed understanding and agreed to proceed.  History of Present Illness: Today, October 26, 2022 SS: VV today due to respiratory illness. Here for new CPAP visit, the last several weeks her compliance hasn't been great. Setup date 07/31/22. HST was ordered but insurance denied. The 1st 2 weeks of January her usage was not great due to illness. She is school bus driver. She got a new machine, but is same type.CPAP is helpful, she is sleeping better, doesn't snore, feels more rested, more energy. Tried nasal pillow mask, wants to go back to full face mask. Feels the nasal pillows are uncomfortable, moves around at night, has to constantly readjust. No issues with machine.   Review of CPAP data 07/31/2022-08/29/22 with usage 29/30 days 97%, greater than 4 hours 25 days at 83%.  Average usage days used 5 hours 26 minutes.  Minimum pressure 7, maximum pressure 13 cm water.  Leak 5.7, AHI 0.5.  95th percentile pressure 10.3.  Initial Visit 05/16/22 Dr. Rexene Alberts: I saw your patient, [name] [name], upon your kind request in my sleep clinic today for initial consultation of her sleep disorder, in particular, evaluation of her prior diagnosis of obstructive sleep apnea.  The patient is unaccompanied today.  As you know, [name] [name] is a 52 year old right-handed woman with an underlying medical history of diabetes, hypertension, hyperlipidemia, migraine headaches, arthritis, anemia, asthma, low back pain, and severe obesity with a BMI of over 45, who was previously diagnosed with obstructive sleep apnea and  placed on CPAP therapy.  I reviewed your office note from 04/03/2022.  She had sleep testing on 08/07/2016.  I was able to review her sleep study results.  She had a split-night sleep study and was laying on hospital sleep lab.  Baseline AHI was 15.8/h.  O2 nadir was 86%.  She was titrated from 5 cm to 10 cm at the time.  She was set up on CPAP of 10 cm in January 2018.  I was able to review her latest compliance data for the past 90 days from 02/15/2022 through 05/15/2022, during which time she used her machine 36 out of 90 days with percent use days greater than 4 hours of 28%, indicating suboptimal compliance, average AHI 1.4/h, leak acceptable with the 95th percentile at 9.8 L/min on a pressure of 10 cm with EPR of 2.  Her DME company is [employer].  Her Epworth sleepiness score is 6 out of 24, fatigue severity score is 24 out of 63.  Her mom has sleep apnea and has a CPAP machine.  Patient has been working on weight loss and has been going to the weight management clinic for the past nearly 3 months.  She has been on Ozempic for about 2 months.  She has drastically reduced her caffeine intake from up to 2 2-liter bottles of soda per day to down to 1-1/2 cans of soda per day.  Interestingly, she has had increase in headaches in the past month and a half it may correlate with her drastic reduction of her caffeine.  She has not been consistent with her  CPAP as she felt the headaches were coming from her CPAP machine.  When she first got on her machine she did notice benefit from the treatment.  Her weight was indeed higher in the past when she was first diagnosed with sleep apnea.  Bedtime is generally between 9:30 PM and 10 PM.  Rise time is between 5 and 5:30 AM.  She works as a International aid/development worker, lives with her 58 year old son.  She has a boyfriend who stays over from time to time.  She is not a smoker and does not currently drink any alcohol.  She has had trouble with her mask, it does not fit as well.  She has  not been in touch with her DME company which is [employer] about this.  She has nocturia about 2-3 times per average night.  She has a TV in her bedroom but does not have it on all night.  They have no pets in the household.    Observations/Objective: Via video visit, is alert and oriented, speech is clear and concise, noted to be sitting, ability to move about freely  Assessment and Plan: 1.  OSA on CPAP -Set up date for new machine was in November 2023, she was already established on CPAP with a baseline sleep study in November 2017 from the hospital sleep lab baseline AHI was 15.8, set up on CPAP January 2018 -After her initial consultation in August 2023 with Dr. Rexene Alberts HST was ordered but was denied by insurance, we proceeded to order a new CPAP machine -Review of initial data from start update 07/31/22-08/29/22 shows overall good usage meeting insurance requirements > 4 hours 83%, has had a few weeks lately she was not able to utilize CPAP due to respiratory illness, but she is quite motivated to get back on track -We will plan to follow-up in 6 months for recheck of the data, determine if we should reorder sleep study for baseline data  Follow Up Instructions: 6 months   I discussed the assessment and treatment plan with the patient. The patient was provided an opportunity to ask questions and all were answered. The patient agreed with the plan and demonstrated an understanding of the instructions.   The patient was advised to call back or seek an in-person evaluation if the symptoms worsen or if the condition fails to improve as anticipated.  [name] Dakin, DNP  [facility] [address] [place], [place] [address] [phone]

## 2022-11-02 ENCOUNTER — Telehealth: Payer: Self-pay

## 2022-11-02 NOTE — Telephone Encounter (Signed)
CPAP order from 10/26/2022 sent to [employer] via community message.

## 2022-11-07 ENCOUNTER — Ambulatory Visit (INDEPENDENT_AMBULATORY_CARE_PROVIDER_SITE_OTHER): Payer: Medicaid Other | Admitting: Physician Assistant

## 2022-11-07 ENCOUNTER — Encounter (INDEPENDENT_AMBULATORY_CARE_PROVIDER_SITE_OTHER): Payer: Self-pay | Admitting: Physician Assistant

## 2022-11-07 VITALS — BP 128/73 | HR 93 | Temp 98.2°F | Ht 62.0 in | Wt 253.0 lb

## 2022-11-07 DIAGNOSIS — Z6841 Body Mass Index (BMI) 40.0 and over, adult: Secondary | ICD-10-CM

## 2022-11-07 DIAGNOSIS — E559 Vitamin D deficiency, unspecified: Secondary | ICD-10-CM | POA: Diagnosis not present

## 2022-11-07 DIAGNOSIS — E1165 Type 2 diabetes mellitus with hyperglycemia: Secondary | ICD-10-CM

## 2022-11-07 DIAGNOSIS — Z7985 Long-term (current) use of injectable non-insulin antidiabetic drugs: Secondary | ICD-10-CM | POA: Diagnosis not present

## 2022-11-07 DIAGNOSIS — E669 Obesity, unspecified: Secondary | ICD-10-CM | POA: Diagnosis not present

## 2022-11-07 NOTE — Progress Notes (Signed)
Chief Complaint:   OBESITY [name] [name] is here to discuss her progress with her obesity treatment plan along with follow-up of her obesity related diagnoses. [name] [name] is on the Category 3 Plan and states she is following her eating plan approximately 75% of the time. [name] [name] states she is Walking 30 minutes 3 times per week.  Today's visit was #: 10 Starting weight: 267 Starting date: 03/16/2022 Today's weight: 253.8 Today's date: 11/07/2022 Total lbs lost to date: 14 lbs  Total lbs lost since last in-office visit: 0  Interim History: [name] [name] has been sick with an upper respiratory infection.  She has been experiencing some increased cravings for comfort foods while sick, but was able to make better choices and maintain her weight while sick. She focused on trying to make sure she got adequate protein. She is continue to try to drink more water/Crystal light and decrease her regular sodas.  Pharmacotherapy: She is on Ozempic 0.5 mg weekly without side effects.  She is not skipping meals.   Subjective:   1. Type 2 diabetes mellitus with hyperglycemia, without long-term current use of insulin (HCC) Her last A1c is 5.7-at goal.  She is on Ozempic 0.5 mg weekly.  Reports no nausea, no vomiting, no constipation or diarrhea, no difficulty swallowing or other side effects from the Ozempic.  She continues to work to decrease simple carbohydrates and increase lean protein to promote weight loss and improve glycemic control  2. Vitamin D deficiency Her last vitamin D level was improved at 39.9 on 09/13/2022.  She is on ergocalciferol 50,000 IU weekly.  No side effects.  3. Obesity ([place])- Starting BMI 48.83   4. BMI 45.0-49.9, adult ([place]), Current 46.4    Assessment/Plan:   1. Type 2 diabetes mellitus with hyperglycemia, without long-term current use of insulin (HCC) Continue Ozempic 0.5 mg weekly.  Patient reports she does not need a refill at this time.  She will continue to work on  decreasing simple carbohydrates, increasing lean protein and exercise to promote weight loss and improve glycemic control.  2. Vitamin D deficiency Continue ergocalciferol 50,000 IU weekly. Vitamin D Deficiency Vitamin D is not at goal of 50.  She is on weekly prescription Vitamin D 50,000 IU.  Lab Results  Component Value Date   VD25OH 39.9 09/13/2022   VD25OH 22.4 (L) 03/20/2022    Plan: Continue prescription vitamin D 50,000 IU weekly.   3. Obesity ([place])- Starting BMI 48.83   4. BMI 45.0-49.9, adult [facility]), Current 46.4   [name] [name] is currently in the action stage of change. As such, her goal is to continue with weight loss efforts. She has agreed to the Category 3 Plan.   Exercise goals: All adults should avoid inactivity. Some physical activity is better than none, and adults who participate in any amount of physical activity gain some health benefits.  Behavioral modification strategies: increasing lean protein intake, decreasing simple carbohydrates, and decreasing liquid calories.  [name] [name] has agreed to follow-up with our clinic in 2 weeks. She was informed of the importance of frequent follow-up visits to maximize her success with intensive lifestyle modifications for her multiple health conditions.    Objective:   Blood pressure 128/73, pulse 93, temperature 98.2 F (36.8 C), height 5' 2"$  (1.575 m), weight 253 lb (114.8 kg), SpO2 95 %. Body mass index is 46.27 kg/m.  General: Cooperative, alert, well developed, in no acute distress. HEENT: Conjunctivae and lids unremarkable. Cardiovascular: Regular rhythm.  Lungs: Normal work of breathing.  Neurologic: No focal deficits.   Lab Results  Component Value Date   CREATININE 0.89 09/13/2022   BUN 9 09/13/2022   NA 140 09/13/2022   K 4.5 09/13/2022   CL 104 09/13/2022   CO2 25 09/13/2022   Lab Results  Component Value Date   ALT 12 09/13/2022   AST 11 09/13/2022   ALKPHOS 77 09/13/2022   BILITOT 0.4 09/13/2022    Lab Results  Component Value Date   HGBA1C 5.7 (A) 09/04/2022   HGBA1C 6.8 (H) 03/20/2022   HGBA1C 6.3 (A) 11/23/2021   HGBA1C 6.7 (A) 08/03/2021   HGBA1C 7.0 (A) 12/09/2020   Lab Results  Component Value Date   INSULIN 13.2 09/13/2022   INSULIN 20.0 03/20/2022   Lab Results  Component Value Date   TSH 2.620 03/20/2022   Lab Results  Component Value Date   CHOL 162 09/13/2022   HDL 57 09/13/2022   LDLCALC 94 09/13/2022   TRIG 53 09/13/2022   CHOLHDL 2.9 08/03/2021   Lab Results  Component Value Date   VD25OH 39.9 09/13/2022   VD25OH 22.4 (L) 03/20/2022   Lab Results  Component Value Date   WBC 7.3 03/20/2022   HGB 12.5 03/20/2022   HCT 40.3 03/20/2022   MCV 83 03/20/2022   PLT 390 03/20/2022   Lab Results  Component Value Date   IRON 36 06/27/2016   TIBC 294 06/27/2016   FERRITIN 140 12/09/2020    Obesity Behavioral Intervention:   Approximately 15 minutes were spent on the discussion below.  ASK: We discussed the diagnosis of obesity with [name] [name] today and [name] [name] agreed to give [place] permission to discuss obesity behavioral modification therapy today.  ASSESS: [name] [name] has the diagnosis of obesity and her BMI today is 46.4. [name] [name] is in the action stage of change.   ADVISE: [name] [name] was educated on the multiple health risks of obesity as well as the benefit of weight loss to improve her health. She was advised of the need for long term treatment and the importance of lifestyle modifications to improve her current health and to decrease her risk of future health problems.  AGREE: Multiple dietary modification options and treatment options were discussed and [name] [name] agreed to follow the recommendations documented in the above note.  ARRANGE: [name] [name] was educated on the importance of frequent visits to treat obesity as outlined per CMS and USPSTF guidelines and agreed to schedule her next follow up appointment today.  Attestation Statements:   Reviewed  by clinician on day of visit: allergies, medications, problem list, medical history, surgical history, family history, social history, and previous encounter notes.  Time spent on visit including pre-visit chart review and post-visit care and charting was 35 minutes.

## 2022-11-13 DIAGNOSIS — H40023 Open angle with borderline findings, high risk, bilateral: Secondary | ICD-10-CM | POA: Diagnosis not present

## 2022-11-16 NOTE — Progress Notes (Signed)
TeleHealth Visit:  This visit was completed with telemedicine (audio/video) technology. [name] [name] has verbally consented to this TeleHealth visit. The patient is located at home, the provider is located at home. The participants in this visit include the listed provider and patient. The visit was conducted today via MyChart video.  OBESITY Tinnie is here to discuss her progress with her obesity treatment plan along with follow-up of her obesity related diagnoses.   Today's visit was # 39 Starting weight: 65 Starting date: 03/16/2022 Weight at last in office visit: 253 lbs on 11/07/22 Total weight loss: 14 lbs at last in office visit on 11/07/22. Today's reported weight: s No weight reported.  Nutrition Plan: the Category 3 plan- 90% adherence Current exercise:  [employer] (treadmill for 35 minutes) sporadically.  Interim History:  [name] [name] reports she is very happy with her plan adherence and averages about 90%.  She is eating 3 meals per day.  She is not measuring her protein and is likely not getting in the full amount. Reports it is hard for her to get all of the food in because she has no appetite.  However she does snack at times-especially around the time of her menses. Soda intake still about 16 ounces per day.  Reports drinking about 10 ounces of water per day and nothing else to drink.  She has started going to the [employer] with her son and walking on the treadmill.  She has knee and back issues which are prohibiting any resistance training.   Assessment/Plan:  1. Type II Diabetes with other unspecified complications, without long-term use of insulin HgbA1c is at goal. Last A1c was 5.7 Medication(s): Ozempic 0.5 mg weekly.  No side effects.  Appetite well-controlled to the point of struggles to get in prescribed protein. Lab Results  Component Value Date   HGBA1C 5.7 (A) 09/04/2022   HGBA1C 6.8 (H) 03/20/2022   HGBA1C 6.3 (A) 11/23/2021   Lab Results  Component Value Date    LDLCALC 94 09/13/2022   CREATININE 0.89 09/13/2022   No results found for: "GFR"  Plan: Refill Ozempic 0.5 mg weekly.   2. Vitamin D Deficiency Vitamin D is not at goal of 50.  Most recent vitamin D level was 39.9 on 09/13/2022.Marland Kitchen She is on  prescription ergocalciferol 50,000 IU weekly. Lab Results  Component Value Date   VD25OH 39.9 09/13/2022   VD25OH 22.4 (L) 03/20/2022    Plan: Refill prescription vitamin D 50,000 IU weekly.   3. Morbid Obesity: Current BMI 46.2 1.  Carry water bottle to work and increase water to at least 32 ounces per day. 2.  Decrease soda to 12 ounces per day-try the 12 ounce bottles rather than the 16 ounce bottles. 3.  Weigh meat at lunch and dinner.  [name] [name] is currently in the action stage of change. As such, her goal is to continue with weight loss efforts.  She has agreed to the Category 3 plan.  Exercise goals: Walk on treadmill at the gym 3 days a week for 30 minutes.  Behavioral modification strategies: increasing lean protein intake, decreasing simple carbohydrates , meal planning , decrease liquid calories, increase water intake, better snacking choices, and planning for success.  [name] [name] has agreed to follow-up with our clinic in 4 weeks.   No orders of the defined types were placed in this encounter.   Medications Discontinued During This Encounter  Medication Reason   Vitamin D, Ergocalciferol, (DRISDOL) 1.25 MG (50000 UNIT) CAPS capsule Reorder   Semaglutide,0.25 or  0.'5MG'$ /DOS, (OZEMPIC, 0.25 OR 0.5 MG/DOSE,) 2 MG/3ML SOPN Reorder     Meds ordered this encounter  Medications   Semaglutide,0.25 or 0.'5MG'$ /DOS, (OZEMPIC, 0.25 OR 0.5 MG/DOSE,) 2 MG/3ML SOPN    Sig: Inject 0.5 mg into the skin once a week.    Dispense:  3 mL    Refill:  0    Order Specific Question:   Supervising Provider    Answer:   Netty Starring   Vitamin D, Ergocalciferol, (DRISDOL) 1.25 MG (50000 UNIT) CAPS capsule    Sig: Take 1 capsule (50,000 Units  total) by mouth every 7 (seven) days.    Dispense:  4 capsule    Refill:  0    Order Specific Question:   Supervising Provider    Answer:   Dell Ponto [[id number]]      Objective:   VITALS: Per patient if applicable, see vitals. GENERAL: Alert and in no acute distress. CARDIOPULMONARY: No increased WOB. Speaking in clear sentences.  PSYCH: Pleasant and cooperative. Speech normal rate and rhythm. Affect is appropriate. Insight and judgement are appropriate. Attention is focused, linear, and appropriate.  NEURO: Oriented as arrived to appointment on time with no prompting.   Attestation Statements:   Reviewed by clinician on day of visit: allergies, medications, problem list, medical history, surgical history, family history, social history, and previous encounter notes.   This was prepared with the assistance of Presenter, broadcasting.  Occasional wrong-word or sound-a-like substitutions may have occurred due to the inherent limitations of voice recognition software.

## 2022-11-20 ENCOUNTER — Telehealth (INDEPENDENT_AMBULATORY_CARE_PROVIDER_SITE_OTHER): Payer: Self-pay | Admitting: Family Medicine

## 2022-11-20 ENCOUNTER — Encounter (INDEPENDENT_AMBULATORY_CARE_PROVIDER_SITE_OTHER): Payer: Self-pay | Admitting: Family Medicine

## 2022-11-20 ENCOUNTER — Telehealth (INDEPENDENT_AMBULATORY_CARE_PROVIDER_SITE_OTHER): Payer: Medicaid Other | Admitting: Family Medicine

## 2022-11-20 DIAGNOSIS — E1169 Type 2 diabetes mellitus with other specified complication: Secondary | ICD-10-CM

## 2022-11-20 DIAGNOSIS — Z6841 Body Mass Index (BMI) 40.0 and over, adult: Secondary | ICD-10-CM

## 2022-11-20 DIAGNOSIS — E559 Vitamin D deficiency, unspecified: Secondary | ICD-10-CM

## 2022-11-20 DIAGNOSIS — Z7985 Long-term (current) use of injectable non-insulin antidiabetic drugs: Secondary | ICD-10-CM

## 2022-11-20 MED ORDER — VITAMIN D (ERGOCALCIFEROL) 1.25 MG (50000 UNIT) PO CAPS: 50000.0000 [IU] | ORAL_CAPSULE | ORAL | 0 refills | Status: DC

## 2022-11-20 MED ORDER — OZEMPIC (0.25 OR 0.5 MG/DOSE) 2 MG/3ML ~~LOC~~ SOPN: 0.5000 mg | PEN_INJECTOR | SUBCUTANEOUS | 0 refills | Status: DC

## 2022-11-20 NOTE — Telephone Encounter (Signed)
Patient was a no-show for virtual visit. Left VM advising patient to call to reschedule.

## 2022-11-24 DIAGNOSIS — Z419 Encounter for procedure for purposes other than remedying health state, unspecified: Secondary | ICD-10-CM | POA: Diagnosis not present

## 2022-11-24 DIAGNOSIS — G4733 Obstructive sleep apnea (adult) (pediatric): Secondary | ICD-10-CM | POA: Diagnosis not present

## 2022-11-28 DIAGNOSIS — G4733 Obstructive sleep apnea (adult) (pediatric): Secondary | ICD-10-CM | POA: Diagnosis not present

## 2022-11-29 ENCOUNTER — Telehealth (INDEPENDENT_AMBULATORY_CARE_PROVIDER_SITE_OTHER): Payer: Medicaid Other | Admitting: Family Medicine

## 2022-12-19 ENCOUNTER — Ambulatory Visit (INDEPENDENT_AMBULATORY_CARE_PROVIDER_SITE_OTHER): Payer: Medicaid Other | Admitting: Family Medicine

## 2022-12-19 NOTE — Progress Notes (Incomplete)
[name] [name], D.O.  ABFM, ABOM Specializing in Clinical Bariatric Medicine  Office located at: [address], [place]  [address]     Assessment and Plan:   No orders of the defined types were placed in this encounter.   There are no discontinued medications.   No orders of the defined types were placed in this encounter.    ***   TREATMENT PLAN FOR OBESITY:  Recommended Dietary Goals [name] [name] is currently in the action stage of change. As such, her goal is to continue weight management plan. She has agreed to {continue/change:29241} {MWMwtlossportion/plan2:23431}.  Behavioral Intervention Additional resources provided today: {weightresources:29185} Evidence-based interventions for health behavior change were utilized today including the discussion of self monitoring techniques, problem-solving barriers and SMART goal setting techniques.   Regarding patient's less desirable eating habits and patterns, we employed the technique of small changes.  Pt will specifically work on: *** for next visit.    Recommended Physical Activity Goals [name] [name] has been advised to work up to 150 minutes of moderate intensity aerobic activity a week and strengthening exercises 2-3 times per week for cardiovascular health, weight loss maintenance and preservation of muscle mass.  She has agreed to {EMEXERCISE:28847::"Continue current level of physical activity "}  Pharmacotherapy We discussed various medication options to help [name] [name] with her weight loss efforts and we both agreed to ***.   FOLLOW UP: No follow-ups on file.*** She was informed of the importance of frequent follow up visits to maximize her success with intensive lifestyle modifications for her multiple health conditions.  Subjective:   Chief complaint: Obesity [name] [name] is here to discuss her progress with her obesity treatment plan. She is on the {MWMwtlossportion/plan2:23431} and states she is following her  eating plan approximately ***% of the time. She states she is exercising *** minutes *** days per week.  Interval History:  [name] [name] is here for a follow up office visit. We reviewed her meal plan and all questions were answered. Patient's food recall appears to be accurate and consistent with what is on plan when she is following it. When eating on plan, her hunger and cravings are well controlled.     Since last office visit she ***  Pharmacotherapy for weight loss: She {srtis (Optional):29129} currently taking {srtpreviousweightlossmeds (Optional):29124} for medical weight loss.  Denies side effects.    Review of Systems:  Pertinent positives were addressed with patient today.  Weight Summary and Biometrics   No data recorded No data recorded ***  No data recorded No data recorded No data recorded No data recorded  Objective:   PHYSICAL EXAM:  There were no vitals taken for this visit. There is no height or weight on file to calculate BMI.  General: Well Developed, well nourished, and in no acute distress.  HEENT: Normocephalic, atraumatic Skin: Warm and dry, cap RF less 2 sec, good turgor Chest:  Normal excursion, shape, no gross abn Respiratory: speaking in full sentences, no conversational dyspnea NeuroM-Sk: Ambulates w/o assistance, moves * 4 Psych: A and O *3, insight good, mood-full  DIAGNOSTIC DATA REVIEWED:  BMET    Component Value Date/Time   NA 140 09/13/2022 1127   K 4.5 09/13/2022 1127   CL 104 09/13/2022 1127   CO2 25 09/13/2022 1127   GLUCOSE 85 09/13/2022 1127   GLUCOSE 126 (H) 03/05/2020 0924   BUN 9 09/13/2022 1127   CREATININE 0.89 09/13/2022 1127   CALCIUM 9.3 09/13/2022 1127   GFRNONAA >60 03/05/2020  [place] 03/05/2020 0924   Lab Results  Component Value Date   HGBA1C 5.7 (A) 09/04/2022   HGBA1C 5.8 09/07/2014   Lab Results  Component Value Date   INSULIN 13.2 09/13/2022   INSULIN 20.0 03/20/2022   Lab Results   Component Value Date   TSH 2.620 03/20/2022   CBC    Component Value Date/Time   WBC 7.3 03/20/2022 0841   WBC 6.9 03/05/2020 0924   RBC 4.87 03/20/2022 0841   RBC 4.89 03/05/2020 0924   HGB 12.5 03/20/2022 0841   HCT 40.3 03/20/2022 0841   PLT 390 03/20/2022 0841   MCV 83 03/20/2022 0841   MCH 25.7 (L) 03/20/2022 0841   MCH 25.8 (L) 03/05/2020 0924   MCHC 31.0 (L) 03/20/2022 0841   MCHC 30.6 03/05/2020 0924   RDW 14.5 03/20/2022 0841   Iron Studies    Component Value Date/Time   IRON 36 06/27/2016 1457   TIBC 294 06/27/2016 1457   FERRITIN 140 12/09/2020 1421   IRONPCTSAT 12 (L) 06/27/2016 1457   Lipid Panel     Component Value Date/Time   CHOL 162 09/13/2022 1127   TRIG 53 09/13/2022 1127   HDL 57 09/13/2022 1127   CHOLHDL 2.9 08/03/2021 1031   LDLCALC 94 09/13/2022 1127   Hepatic Function Panel     Component Value Date/Time   PROT 7.0 09/13/2022 1127   ALBUMIN 3.9 09/13/2022 1127   AST 11 09/13/2022 1127   ALT 12 09/13/2022 1127   ALKPHOS 77 09/13/2022 1127   BILITOT 0.4 09/13/2022 1127      Component Value Date/Time   TSH 2.620 03/20/2022 0841   Nutritional Lab Results  Component Value Date   VD25OH 39.9 09/13/2022   VD25OH 22.4 (L) 03/20/2022    Attestations:   Reviewed by clinician on day of visit: allergies, medications, problem list, medical history, surgical history, family history, social history, and previous encounter notes.   Patient was in the office today and time spent on visit including pre-visit chart review and post-visit care/coordination of care and electronic medical record documentation was *** minutes. 50% of the time was in face to face counseling of this patient's medical condition(s) and providing education on treatment options to include the first-line treatment of diet and lifestyle modification.   I,Special Puri,acting as a Education administrator for [employer], DO.,have documented all relevant documentation on the behalf of  Mellody Dance, DO,as directed by  Mellody Dance, DO while in the presence of Mellody Dance, DO.   I, Mellody Dance, DO, have reviewed all documentation for this visit. The documentation on 12/19/22 for the exam, diagnosis, procedures, and orders are all accurate and complete.

## 2022-12-25 DIAGNOSIS — Z419 Encounter for procedure for purposes other than remedying health state, unspecified: Secondary | ICD-10-CM | POA: Diagnosis not present

## 2022-12-25 DIAGNOSIS — G4733 Obstructive sleep apnea (adult) (pediatric): Secondary | ICD-10-CM | POA: Diagnosis not present

## 2022-12-27 ENCOUNTER — Other Ambulatory Visit (INDEPENDENT_AMBULATORY_CARE_PROVIDER_SITE_OTHER): Payer: Self-pay | Admitting: Family Medicine

## 2022-12-27 DIAGNOSIS — E1169 Type 2 diabetes mellitus with other specified complication: Secondary | ICD-10-CM

## 2022-12-29 DIAGNOSIS — G4733 Obstructive sleep apnea (adult) (pediatric): Secondary | ICD-10-CM | POA: Diagnosis not present

## 2022-12-30 ENCOUNTER — Other Ambulatory Visit (INDEPENDENT_AMBULATORY_CARE_PROVIDER_SITE_OTHER): Payer: Self-pay | Admitting: Family Medicine

## 2022-12-30 DIAGNOSIS — E1169 Type 2 diabetes mellitus with other specified complication: Secondary | ICD-10-CM

## 2022-12-30 DIAGNOSIS — E559 Vitamin D deficiency, unspecified: Secondary | ICD-10-CM

## 2023-01-02 ENCOUNTER — Other Ambulatory Visit (INDEPENDENT_AMBULATORY_CARE_PROVIDER_SITE_OTHER): Payer: Self-pay | Admitting: Family Medicine

## 2023-01-02 DIAGNOSIS — E559 Vitamin D deficiency, unspecified: Secondary | ICD-10-CM

## 2023-01-02 DIAGNOSIS — E1169 Type 2 diabetes mellitus with other specified complication: Secondary | ICD-10-CM

## 2023-01-03 MED ORDER — VITAMIN D (ERGOCALCIFEROL) 1.25 MG (50000 UNIT) PO CAPS: 50000.0000 [IU] | ORAL_CAPSULE | ORAL | 0 refills | Status: DC

## 2023-01-03 MED ORDER — OZEMPIC (0.25 OR 0.5 MG/DOSE) 2 MG/3ML ~~LOC~~ SOPN: 0.5000 mg | PEN_INJECTOR | SUBCUTANEOUS | 0 refills | Status: DC

## 2023-01-08 ENCOUNTER — Encounter: Payer: Self-pay | Admitting: Student

## 2023-01-14 ENCOUNTER — Encounter: Payer: Self-pay | Admitting: Neurology

## 2023-01-15 NOTE — Progress Notes (Deleted)
  TeleHealth Visit:  This visit was completed with telemedicine (audio/video) technology. Oona has verbally consented to this TeleHealth visit. The patient is located at home, the provider is located at home. The participants in this visit include the listed provider and patient. The visit was conducted today via MyChart video.  OBESITY Terriah is here to discuss her progress with her obesity treatment plan along with follow-up of her obesity related diagnoses.   Today's visit was # 12 Starting weight: 267 Starting date: 03/16/2022 Weight at last in office visit: 253 lbs on 11/07/22 Total weight loss: 14 lbs at last in office visit on 11/07/22. Today's reported weight (01/16/23): {dwwweightreported:29243}  Nutrition Plan: the Category 3 plan - ***% adherence.  Current exercise: {exercise types:16438} [employer] (treadmill for 35 minutes) sporadically.  Interim History:  ***  Eating all of the prescribed protein: {yes***/no:17258} Skipping meals: {dwwyes:29172} Drinking adequate water: {dwwyes:29172} Drinking sugar sweetened beverages: {dwwyes:29172} Hunger controlled: {EWCONTROLASSESSMENT:24261}. Cravings controlled:  {EWCONTROLASSESSMENT:24261}.  Journaling Consistently:  {dwwyes:29172} Meeting protein goals:  {dwwyes:29172} Meeting calorie goals:  {dwwyes:29172}   Pharmacotherapy: Tykeisha is on {dwwpharmacotherapy:29109} Adverse side effects: {dwwse:29122} Hunger is {EWCONTROLASSESSMENT:24261}.  Cravings are {EWCONTROLASSESSMENT:24261}.  Assessment/Plan:  1. ***  2. ***  3. ***  {dwwmorbid:29108::"Morbid Obesity"}: Current BMI *** Pharmacotherapy Plan {dwwmed:29123}  {dwwpharmacotherapy:29109} Sarah-Jane {CHL AMB IS/IS NOT:210130109} currently in the action stage of change. As such, her goal is to {MWMwtloss#1:210800005}.  She has agreed to {dwwsldiets:29085}.  Exercise goals: {MWM EXERCISE RECS:23473}  Behavioral modification strategies:  {dwwslwtlossstrategies:29088}.  Rabab has agreed to follow-up with our clinic in {NUMBER 1-10:22536} weeks.   No orders of the defined types were placed in this encounter.   There are no discontinued medications.   No orders of the defined types were placed in this encounter.     Objective:   VITALS: Per patient if applicable, see vitals. GENERAL: Alert and in no acute distress. CARDIOPULMONARY: No increased WOB. Speaking in clear sentences.  PSYCH: Pleasant and cooperative. Speech normal rate and rhythm. Affect is appropriate. Insight and judgement are appropriate. Attention is focused, linear, and appropriate.  NEURO: Oriented as arrived to appointment on time with no prompting.   Attestation Statements:   Reviewed by clinician on day of visit: allergies, medications, problem list, medical history, surgical history, family history, social history, and previous encounter notes.  ***(delete if time-based billing not used) Time spent on visit including the items listed below was *** minutes.  -preparing to see the patient (e.g., review of tests, history, previous notes) -obtaining and/or reviewing separately obtained history -counseling and educating the patient/family/caregiver -documenting clinical information in the electronic or other health record -ordering medications, tests, or procedures -independently interpreting results and communicating results to the patient/ family/caregiver -referring and communicating with other health care professionals  -care coordination   This was prepared with the assistance of Engineer, civil (consulting).  Occasional wrong-word or sound-a-like substitutions may have occurred due to the inherent limitations of voice recognition software.

## 2023-01-16 ENCOUNTER — Telehealth (INDEPENDENT_AMBULATORY_CARE_PROVIDER_SITE_OTHER): Payer: Medicaid Other | Admitting: Family Medicine

## 2023-01-16 ENCOUNTER — Telehealth (INDEPENDENT_AMBULATORY_CARE_PROVIDER_SITE_OTHER): Payer: Self-pay | Admitting: Family Medicine

## 2023-01-16 DIAGNOSIS — Z6841 Body Mass Index (BMI) 40.0 and over, adult: Secondary | ICD-10-CM

## 2023-01-16 NOTE — Telephone Encounter (Signed)
Patient was a no-show for virtual visit. Left VM advising patient to call to reschedule.  

## 2023-01-24 DIAGNOSIS — Z419 Encounter for procedure for purposes other than remedying health state, unspecified: Secondary | ICD-10-CM | POA: Diagnosis not present

## 2023-01-28 DIAGNOSIS — G4733 Obstructive sleep apnea (adult) (pediatric): Secondary | ICD-10-CM | POA: Diagnosis not present

## 2023-01-29 ENCOUNTER — Other Ambulatory Visit (INDEPENDENT_AMBULATORY_CARE_PROVIDER_SITE_OTHER): Payer: Self-pay | Admitting: Family Medicine

## 2023-01-29 DIAGNOSIS — E559 Vitamin D deficiency, unspecified: Secondary | ICD-10-CM

## 2023-01-29 DIAGNOSIS — E1169 Type 2 diabetes mellitus with other specified complication: Secondary | ICD-10-CM

## 2023-02-05 ENCOUNTER — Other Ambulatory Visit (INDEPENDENT_AMBULATORY_CARE_PROVIDER_SITE_OTHER): Payer: Self-pay | Admitting: Family Medicine

## 2023-02-05 ENCOUNTER — Encounter: Payer: Self-pay | Admitting: Student

## 2023-02-05 DIAGNOSIS — E1169 Type 2 diabetes mellitus with other specified complication: Secondary | ICD-10-CM

## 2023-02-06 ENCOUNTER — Encounter (INDEPENDENT_AMBULATORY_CARE_PROVIDER_SITE_OTHER): Payer: Self-pay | Admitting: Family Medicine

## 2023-02-07 ENCOUNTER — Encounter: Payer: Self-pay | Admitting: Student

## 2023-02-07 ENCOUNTER — Ambulatory Visit (INDEPENDENT_AMBULATORY_CARE_PROVIDER_SITE_OTHER): Payer: Medicaid Other | Admitting: Student

## 2023-02-07 ENCOUNTER — Other Ambulatory Visit: Payer: Self-pay

## 2023-02-07 VITALS — BP 118/76 | HR 74 | Temp 97.7°F | Ht 62.0 in | Wt 264.9 lb

## 2023-02-07 DIAGNOSIS — R1033 Periumbilical pain: Secondary | ICD-10-CM

## 2023-02-07 NOTE — Addendum Note (Signed)
Addended by: Modena Slater on: 02/07/2023 02:03 PM   Modules accepted: Orders

## 2023-02-07 NOTE — Progress Notes (Addendum)
CC: Bellybutton pain  HPI:  [name] is a 52 y.o. female with history of HTN, diabetes, renal insufficiency who presents for belly button pain.  Please see assessment and plan for full HPI.  Foot exam updated last visit on 09/04/2022.  Patient continued on Ozempic.  Patient has lost 30 pounds since starting Ozempic.  Diabetes: Well-controlled: Ozempic 0.5 mg weekly, Crestor 20 mg daily Vitamin D deficiency: Vitamin D 50,000 units weekly Chronic lumbar radiculopathy: Gabapentin 300 mg nightly  Most recent labs: 09/13/2022 vitamin D 39.9 Lipid panel total cholesterol 162, HDL 57, LDL 94, triglycerides 53 CMP: Creatinine 0.89, sodium 140, potassium 4.5, ALT 12, AST 11  Past Medical History:  Diagnosis Date   Acute respiratory failure with hypoxia (HCC) 06/18/2019   Allergy    SEASONAL   Anemia    Anxiety    Arthritis    "left knee"   Asthma    Back pain    Chronic lower back pain    "on the left side"   Diabetes (HCC)    Food allergy    Gallbladder problem    Headache(784.0) 02/29/2012   "frequent"   High cholesterol    Hyperkalemia 10/30/2018   Hypertension    Left knee pain 11/04/2015   Migraines    Neuromuscular disorder (HCC)    Siactic   Over weight    Pneumonia 2006   Post viral syndrome 07/17/2019   Renal insufficiency 06/18/2019   Sleep apnea    CPAP   SOB (shortness of breath)    Stress at home    "and at work"     Current Outpatient Medications:    cephALEXin (KEFLEX) 500 MG capsule, Take 1 capsule (500 mg total) by mouth 2 (two) times daily for 7 days., Disp: 14 capsule, Rfl: 0   Accu-Chek Softclix Lancets lancets, Use as instructed, Disp: 100 each, Rfl: 12   albuterol (PROAIR HFA) 108 (90 Base) MCG/ACT inhaler, Inhale 1-2 puffs into the lungs every 6 (six) hours as needed for wheezing or shortness of breath., Disp: 8 g, Rfl: 2   benzonatate (TESSALON) 100 MG capsule, Take 1 capsule (100 mg total) by mouth every 8 (eight) hours as  needed for cough., Disp: 21 capsule, Rfl: 0   Blood Glucose Monitoring Suppl (ACCU-CHEK GUIDE) w/Device KIT, Use to test blood sugar in the morning, Dx E11.9, Disp: 1 kit, Rfl: 0   gabapentin (NEURONTIN) 300 MG capsule, Take 1 capsule (300 mg total) by mouth at bedtime., Disp: 90 capsule, Rfl: 2   glucose blood (ACCU-CHEK GUIDE) test strip, USE AS DIRECTED, Disp: 100 each, Rfl: 0   ibuprofen (ADVIL) 400 MG tablet, Take 2 tablets (800 mg total) by mouth every 8 (eight) hours as needed., Disp: 100 tablet, Rfl: 2   rosuvastatin (CRESTOR) 20 MG tablet, Take 1 tablet (20 mg total) by mouth daily., Disp: 90 tablet, Rfl: 2   Semaglutide,0.25 or 0.5MG /DOS, (OZEMPIC, 0.25 OR 0.5 MG/DOSE,) 2 MG/3ML SOPN, Inject 0.5 mg into the skin once a week., Disp: 3 mL, Rfl: 0   Vitamin D, Ergocalciferol, (DRISDOL) 1.25 MG (50000 UNIT) CAPS capsule, Take 1 capsule (50,000 Units total) by mouth every 7 (seven) days., Disp: 4 capsule, Rfl: 0  Review of Systems:    GI: Patient endorses umbilical pain   Physical Exam:  Vitals:   02/07/23 1125 02/07/23 1126  BP:  118/76  Pulse:  74  Temp:  97.7 F (36.5 C)  TempSrc:  Oral  SpO2:  99%  Weight: 264 lb 14.4 oz (120.2 kg)   Height: 5\' 2"  (1.575 m)     General: Patient is sitting comfortably in the room  Head: Normocephalic, atraumatic  Abdomen: Soft, with tenderness noted to the 10 o clock position of the umbilicus. No skin changes noted. There is some serosanguinous drainage noted that is malodorous. No skin break down or trauma noted. No erythema noted.    Assessment & Plan:   Umbilical pain Patient endorses 4-day history of bellybutton pain and bleeding.  Patient states that she noticed drainage coming out of her bellybutton 4 days ago, and stated that she tried to clean it with a Q-tip, and noticed blood on the Q-tip.  She also reports having pain now.  She describes this to be a sore achy pain that gets worse when she sits up.  It alleviates when she lays  down.  She denies any trauma to the area.  She does report having a history of a cholecystectomy back in 2015.  She denies any bellybutton piercing.  She denies any other trauma.  She does report she had a URI a couple days ago, in which she had a fever while 101.2 F.  She states she has not had any fever since.  She does report some chills. Patient denies any changes to her bowel habits. She denies pain in any other parts of her abdomen. On exam I do not appreciate any growths under the skin nor do I appreciate any fluctuance underneath the skin.  There is some tenderness noted on the 10 o'clock position of the bellybutton on palpation.  There is some clear drainage noted, and on Q-tip exam, there is resistance minutes.  I do not appreciate any skin tears.  There is minimal erythema.  Umbilicus is malodorous.  I do not think patient has a hernia.  Also do not think this is a colocutaneous fistula.  I do not think this is a mass underneath her bellybutton.  This could be a skin infection, that is seeing deeper.  I do not feel any abscess on my exam.  Would like to rule out any deeper infection with CT scan.  Plan: -CT abdomen pelvis pending -Supportive care -Return in 1 month -Red flag symptoms provided to patient to call back for if noticed   Addendum:  -CT showing enhancement under the umbilicus in the subcutaneous tissue concerning for underlying infection. Will treat with course of cephalexin.   Patient seen with Dr. Geraldine Contras, DO PGY-1 Internal Medicine Resident  Pager: [phone]

## 2023-02-07 NOTE — Patient Instructions (Addendum)
Irielle, Berglin you for allowing me to take part in your care today.  Here are your instructions.  1. I have referred you for a CT scan. I am hoping that this is going to be done this week, Wait for a phone call.  2. If you notice heavy bleeding, skin color changes that show streaks of red, or if you noticed a growth, please call us back.  3. Keep doing the warm compresses for now and keep eye out on it.    Thank you, Dr. Allena Katz  If you have any other questions please contact the internal medicine clinic at [phone]

## 2023-02-07 NOTE — Assessment & Plan Note (Addendum)
Patient endorses 4-day history of bellybutton pain and bleeding.  Patient states that she noticed drainage coming out of her bellybutton 4 days ago, and stated that she tried to clean it with a Q-tip, and noticed blood on the Q-tip.  She also reports having pain now.  She describes this to be a sore achy pain that gets worse when she sits up.  It alleviates when she lays down.  She denies any trauma to the area.  She does report having a history of a cholecystectomy back in 2015.  She denies any bellybutton piercing.  She denies any other trauma.  She does report she had a URI a couple days ago, in which she had a fever while 101.2 F.  She states she has not had any fever since.  She does report some chills. Patient denies any changes to her bowel habits. She denies pain in any other parts of her abdomen. On exam I do not appreciate any growths under the skin nor do I appreciate any fluctuance underneath the skin.  There is some tenderness noted on the 10 o'clock position of the bellybutton on palpation.  There is some clear drainage noted, and on Q-tip exam, there is resistance minutes.  I do not appreciate any skin tears.  There is minimal erythema.  Umbilicus is malodorous.  I do not think patient has a hernia.  Also do not think this is a colocutaneous fistula.  I do not think this is a mass underneath her bellybutton.  This could be a skin infection, that is seeing deeper.  I do not feel any abscess on my exam.  Would like to rule out any deeper infection with CT scan.  Plan: -CT abdomen pelvis pending -Supportive care -Return in 1 month -Red flag symptoms provided to patient to call back for if noticed   Addendum:  -CT showing enhancement under the umbilicus in the subcutaneous tissue concerning for underlying infection. Will treat with course of cephalexin.

## 2023-02-08 NOTE — Progress Notes (Signed)
Internal Medicine Clinic Attending  I saw and evaluated the patient.  I personally confirmed the key portions of the history and exam documented by Dr. Allena Katz and I reviewed pertinent patient test results.  The assessment, diagnosis, and plan were formulated together and I agree with the documentation in the resident's note. Unusual presentation of serous umbilical drainage and periumbilical discomfort. No evidence of erythema, fluctuance, calor, or underlying mass on examination. Systemically well and HDS in clinic. Suspect possible urachal abnormality. No additional abdominal symptoms. CT abdomen/pelvis w/ contrast to evaluate. Return precautions provided for worsening symptoms.

## 2023-02-09 ENCOUNTER — Telehealth: Payer: Self-pay | Admitting: Student

## 2023-02-09 NOTE — Telephone Encounter (Signed)
Called patient to update about CT scan and that it may not be done this week. She states that she is still having pain and is still having some drainage. She notes that her umbilicus is getting more raw. She has not had a fever. I gave her instructions on how to get in touch with the on call resident if she needs anything. She understands this. I gave her signs to watch out for and if she develops fevers, or pain out of proportion and notices red streaks to go to the nearest emergency department to be evaluated.

## 2023-02-13 ENCOUNTER — Ambulatory Visit (HOSPITAL_COMMUNITY)
Admission: RE | Admit: 2023-02-13 | Discharge: 2023-02-13 | Disposition: A | Discharge status: Home / Self Care | Payer: Medicaid Other | Source: Ambulatory Visit | Attending: Internal Medicine | Admitting: Internal Medicine

## 2023-02-13 DIAGNOSIS — R1033 Periumbilical pain: Secondary | ICD-10-CM | POA: Diagnosis not present

## 2023-02-13 LAB — POCT I-STAT CREATININE: Creatinine, Ser: 0.8 mg/dL (ref 0.44–1.00)

## 2023-02-13 MED ORDER — IOHEXOL 350 MG/ML SOLN
75.0000 mL | Freq: Once | INTRAVENOUS | Status: AC | PRN
  Administered 2023-02-13: 75 mL via INTRAVENOUS

## 2023-02-13 MED ORDER — CEPHALEXIN 500 MG PO CAPS: 500.0000 mg | ORAL_CAPSULE | Freq: Two times a day (BID) | ORAL | 0 refills | Status: AC

## 2023-02-13 NOTE — Addendum Note (Signed)
Addended by: Modena Slater on: 02/13/2023 10:57 AM   Modules accepted: Orders

## 2023-02-20 ENCOUNTER — Ambulatory Visit: Payer: Medicaid Other | Admitting: Neurology

## 2023-02-20 ENCOUNTER — Telehealth (INDEPENDENT_AMBULATORY_CARE_PROVIDER_SITE_OTHER): Payer: Self-pay | Admitting: Family Medicine

## 2023-02-20 ENCOUNTER — Encounter (INDEPENDENT_AMBULATORY_CARE_PROVIDER_SITE_OTHER): Payer: Self-pay | Admitting: Family Medicine

## 2023-02-20 ENCOUNTER — Ambulatory Visit (INDEPENDENT_AMBULATORY_CARE_PROVIDER_SITE_OTHER): Payer: Medicaid Other | Admitting: Family Medicine

## 2023-02-20 VITALS — BP 118/80 | HR 80 | Temp 97.8°F | Ht 62.0 in | Wt 261.0 lb

## 2023-02-20 DIAGNOSIS — R0602 Shortness of breath: Secondary | ICD-10-CM

## 2023-02-20 DIAGNOSIS — Z7985 Long-term (current) use of injectable non-insulin antidiabetic drugs: Secondary | ICD-10-CM

## 2023-02-20 DIAGNOSIS — E669 Obesity, unspecified: Secondary | ICD-10-CM | POA: Diagnosis not present

## 2023-02-20 DIAGNOSIS — E1169 Type 2 diabetes mellitus with other specified complication: Secondary | ICD-10-CM

## 2023-02-20 DIAGNOSIS — Z6841 Body Mass Index (BMI) 40.0 and over, adult: Secondary | ICD-10-CM | POA: Diagnosis not present

## 2023-02-20 DIAGNOSIS — E559 Vitamin D deficiency, unspecified: Secondary | ICD-10-CM

## 2023-02-20 MED ORDER — OZEMPIC (0.25 OR 0.5 MG/DOSE) 2 MG/3ML ~~LOC~~ SOPN: 0.5000 mg | PEN_INJECTOR | SUBCUTANEOUS | 0 refills | Status: DC

## 2023-02-20 MED ORDER — ALBUTEROL SULFATE HFA 108 (90 BASE) MCG/ACT IN AERS: 1.0000 | INHALATION_SPRAY | Freq: Four times a day (QID) | RESPIRATORY_TRACT | 2 refills | Status: DC | PRN

## 2023-02-20 MED ORDER — VITAMIN D (ERGOCALCIFEROL) 1.25 MG (50000 UNIT) PO CAPS: 50000.0000 [IU] | ORAL_CAPSULE | ORAL | 0 refills | Status: DC

## 2023-02-20 NOTE — Progress Notes (Signed)
Chief Complaint:   OBESITY [name] [name] is here to discuss her progress with her obesity treatment plan along with follow-up of her obesity related diagnoses. [name] [name] is on the Category 3 Plan and states she is following her eating plan approximately 50% of the time. [name] [name] states she is walking for 30 minutes 2-3 times per week.  Today's visit was #: 12 Starting weight: 267 lbs Starting date: 03/16/2022 Today's weight: 261 lbs Today's date: 02/20/2023 Total lbs lost to date: 6 Total lbs lost since last in-office visit: 0  Interim History: Patient has not been in the office since February.  She missed her last appointment with Dawn.  She has had family members in and out of the hospital and so recognizes she has not been taking care of herself as well as she needs to be. Family members are doing better now and so she is getting back to meal plan again.  She is noticing she doesn't want to eat as much meat protein as the weather has warmed up.  She is wondering about whether or not she can substitute a protein shake or protein bar.   Subjective:   1. SOB (shortness of breath) on exertion Patient is feeling short of breath with her consistent walking as is gotten warmer.  She is using albuterol during times of increased dyspnea.  2. Type 2 diabetes mellitus with other specified complication, without long-term current use of insulin (HCC) Patient is on Ozempic 0.5 mg subcu weekly with no GI side effects.  She has been off her medication for 1 week.  3. Vitamin D deficiency Patient denies nausea, vomiting, or muscle weakness but notes fatigue.  She is on prescription vitamin D.  Assessment/Plan:   1. SOB (shortness of breath) on exertion Patient will continue albuterol, we will refill for 1 month.  - albuterol (VENTOLIN HFA) 108 (90 Base) MCG/ACT inhaler; Inhale 2 puffs into the lungs every 6 (six) hours as needed for wheezing or shortness of breath.  Dispense: 8 g; Refill: 0  2. Type  2 diabetes mellitus with other specified complication, without long-term current use of insulin (HCC) Patient will continue Ozempic 0.5 mg, we will refill for 1 month.  - Semaglutide,0.25 or 0.5MG /DOS, (OZEMPIC, 0.25 OR 0.5 MG/DOSE,) 2 MG/3ML SOPN; Inject 0.5 mg into the skin once a week.  Dispense: 3 mL; Refill: 0  3. Vitamin D deficiency Patient will continue prescription vitamin D will continue prescription vitamin D, we will refill for 1 month.  Patient needs vitamin D level repeat at her next appointment.  - Vitamin D, Ergocalciferol, (DRISDOL) 1.25 MG (50000 UNIT) CAPS capsule; Take 1 capsule (50,000 Units total) by mouth every 7 (seven) days.  Dispense: 4 capsule; Refill: 0  4. BMI 45.0-49.9, adult (HCC)  5. Obesity with starting BMI of 48.8 [name] [name] is currently in the action stage of change. As such, her goal is to continue with weight loss efforts. She has agreed to the Category 3 Plan.   We will repeat fasting labs at her next appointment.   Exercise goals: All adults should avoid inactivity. Some physical activity is better than none, and adults who participate in any amount of physical activity gain some health benefits.  Behavioral modification strategies: increasing lean protein intake, meal planning and cooking strategies, keeping healthy foods in the home, and planning for success.  [name] [name] has agreed to follow-up with our clinic in 3 to 4 weeks. She was informed of the importance of frequent follow-up visits  to maximize her success with intensive lifestyle modifications for her multiple health conditions.   Objective:   Blood pressure 118/80, pulse 80, temperature 97.8 F (36.6 C), height 5\' 2"  (1.575 m), weight 261 lb (118.4 kg), SpO2 99 %. Body mass index is 47.74 kg/m.  General: Cooperative, alert, well developed, in no acute distress. HEENT: Conjunctivae and lids unremarkable. Cardiovascular: Regular rhythm.  Lungs: Normal work of breathing. Neurologic: No focal  deficits.   Lab Results  Component Value Date   CREATININE 0.80 02/13/2023   BUN 9 09/13/2022   NA 140 09/13/2022   K 4.5 09/13/2022   CL 104 09/13/2022   CO2 25 09/13/2022   Lab Results  Component Value Date   ALT 12 09/13/2022   AST 11 09/13/2022   ALKPHOS 77 09/13/2022   BILITOT 0.4 09/13/2022   Lab Results  Component Value Date   HGBA1C 5.7 (A) 09/04/2022   HGBA1C 6.8 (H) 03/20/2022   HGBA1C 6.3 (A) 11/23/2021   HGBA1C 6.7 (A) 08/03/2021   HGBA1C 7.0 (A) 12/09/2020   Lab Results  Component Value Date   INSULIN 13.2 09/13/2022   INSULIN 20.0 03/20/2022   Lab Results  Component Value Date   TSH 2.620 03/20/2022   Lab Results  Component Value Date   CHOL 162 09/13/2022   HDL 57 09/13/2022   LDLCALC 94 09/13/2022   TRIG 53 09/13/2022   CHOLHDL 2.9 08/03/2021   Lab Results  Component Value Date   VD25OH 39.9 09/13/2022   VD25OH 22.4 (L) 03/20/2022   Lab Results  Component Value Date   WBC 7.3 03/20/2022   HGB 12.5 03/20/2022   HCT 40.3 03/20/2022   MCV 83 03/20/2022   PLT 390 03/20/2022   Lab Results  Component Value Date   IRON 36 06/27/2016   TIBC 294 06/27/2016   FERRITIN 140 12/09/2020   Attestation Statements:   Reviewed by clinician on day of visit: allergies, medications, problem list, medical history, surgical history, family history, social history, and previous encounter notes.   I, Burt Knack, am acting as transcriptionist for Reuben Likes, MD.  I have reviewed the above documentation for accuracy and completeness, and I agree with the above. - Reuben Likes, MD

## 2023-02-20 NOTE — Telephone Encounter (Signed)
PA for Albuterol Sulfate HFA was submitted and denied. 02/20/2023

## 2023-02-21 MED ORDER — ALBUTEROL SULFATE HFA 108 (90 BASE) MCG/ACT IN AERS: 2.0000 | INHALATION_SPRAY | Freq: Four times a day (QID) | RESPIRATORY_TRACT | 0 refills | Status: AC | PRN

## 2023-02-24 DIAGNOSIS — Z419 Encounter for procedure for purposes other than remedying health state, unspecified: Secondary | ICD-10-CM | POA: Diagnosis not present

## 2023-02-27 DIAGNOSIS — G4733 Obstructive sleep apnea (adult) (pediatric): Secondary | ICD-10-CM | POA: Diagnosis not present

## 2023-03-09 ENCOUNTER — Encounter: Payer: Medicaid Other | Admitting: Student

## 2023-03-19 ENCOUNTER — Encounter (INDEPENDENT_AMBULATORY_CARE_PROVIDER_SITE_OTHER): Payer: Self-pay

## 2023-03-19 ENCOUNTER — Ambulatory Visit (INDEPENDENT_AMBULATORY_CARE_PROVIDER_SITE_OTHER): Payer: Medicaid Other | Admitting: Family Medicine

## 2023-03-20 ENCOUNTER — Ambulatory Visit (INDEPENDENT_AMBULATORY_CARE_PROVIDER_SITE_OTHER): Payer: Medicaid Other | Admitting: Family Medicine

## 2023-03-20 ENCOUNTER — Encounter (INDEPENDENT_AMBULATORY_CARE_PROVIDER_SITE_OTHER): Payer: Self-pay | Admitting: Family Medicine

## 2023-03-20 VITALS — BP 110/81 | HR 80 | Temp 98.1°F | Ht 62.0 in | Wt 255.0 lb

## 2023-03-20 DIAGNOSIS — E1169 Type 2 diabetes mellitus with other specified complication: Secondary | ICD-10-CM | POA: Diagnosis not present

## 2023-03-20 DIAGNOSIS — E559 Vitamin D deficiency, unspecified: Secondary | ICD-10-CM

## 2023-03-20 DIAGNOSIS — Z6841 Body Mass Index (BMI) 40.0 and over, adult: Secondary | ICD-10-CM | POA: Diagnosis not present

## 2023-03-20 DIAGNOSIS — Z7985 Long-term (current) use of injectable non-insulin antidiabetic drugs: Secondary | ICD-10-CM

## 2023-03-20 DIAGNOSIS — E669 Obesity, unspecified: Secondary | ICD-10-CM

## 2023-03-20 MED ORDER — VITAMIN D (ERGOCALCIFEROL) 1.25 MG (50000 UNIT) PO CAPS: 50000.0000 [IU] | ORAL_CAPSULE | ORAL | 0 refills | Status: DC

## 2023-03-20 MED ORDER — SEMAGLUTIDE (1 MG/DOSE) 4 MG/3ML ~~LOC~~ SOPN: 1.0000 mg | PEN_INJECTOR | SUBCUTANEOUS | 0 refills | Status: DC

## 2023-03-20 NOTE — Progress Notes (Signed)
[name] [name], D.O.  ABFM, ABOM Specializing in Clinical Bariatric Medicine  Office located at: [address] [place], [place]  [address]     Assessment and Plan:   Orders Placed This Encounter  Procedures   CBC with Differential/Platelet   Comprehensive metabolic panel   Hemoglobin A1c   Insulin, random   T4, free   TSH   VITAMIN D 25 Hydroxy (Vit-D Deficiency, Fractures)    Medications Discontinued During This Encounter  Medication Reason   Semaglutide,0.25 or 0.5MG /DOS, (OZEMPIC, 0.25 OR 0.5 MG/DOSE,) 2 MG/3ML SOPN    Vitamin D, Ergocalciferol, (DRISDOL) 1.25 MG (50000 UNIT) CAPS capsule Reorder     Meds ordered this encounter  Medications   Vitamin D, Ergocalciferol, (DRISDOL) 1.25 MG (50000 UNIT) CAPS capsule    Sig: Take 1 capsule (50,000 Units total) by mouth every 7 (seven) days.    Dispense:  4 capsule    Refill:  0   Semaglutide, 1 MG/DOSE, 4 MG/3ML SOPN    Sig: Inject 1 mg as directed once a week.    Dispense:  3 mL    Refill:  0     Type 2 diabetes mellitus with other specified complication, without long-term current use of insulin (HCC) Assessment: Condition is improving, but not optimized. Her diabetes mellitus is being treated with Ozempic 0.5 mg once weekly. She is tolerating medication well, denies any GI upset. She endorses still having cravings at night.   Lab Results  Component Value Date   HGBA1C 5.7 (A) 09/04/2022   HGBA1C 6.8 (H) 03/20/2022   HGBA1C 6.3 (A) 11/23/2021   INSULIN 13.2 09/13/2022   INSULIN 20.0 03/20/2022    Plan: Recheck labs.   Increase Ozempic to 1 mg once weekly. Potential risks/ benefits of the increasing medication dose were explained to patient. Explained that the more she eats "off-plan", the more likely for her to have side effects of the drug. I also reviewed the different Ozempic clicks ( 72 clicks for 1 mg, 54 clicks for 0.75 mg, and 36 clicks for 0.5 mg).   Reminded patient that weighing her proteins  accurately/having protein with each meal is important for controlling hunger and cravings. Continue her prudent nutritional plan that is low in simple carbohydrates, saturated fats and trans fats to goal of 5-10% weight loss to achieve significant health benefits.  Pt encouraged to continually advance exercise and cardiovascular fitness as tolerated throughout weight loss journey. Will continue to monitor condition closely alongside PCP.    Vitamin D deficiency Assessment: Condition is improving, but not optimized. Her Vitamin D def is being treated with Ergocalciferol 50K IU weekly. Denies any adverse effects.   Lab Results  Component Value Date   VD25OH 39.9 09/13/2022   VD25OH 22.4 (L) 03/20/2022   Plan: Continue with prescribed supplement at current dose. Will refill ERGO today.    Weight loss will likely improve availability of vitamin D, thus encouraged [name] [name] to continue with meal plan and their weight loss efforts to further improve this condition.  Thus, we will need to monitor levels regularly (every 3-4 mo on average) to keep levels within normal limits and prevent over supplementation.   TREATMENT PLAN FOR OBESITY: BMI 45.0-49.9, adult (HCC) - current BMI 46.63 Obesity with starting BMI of 48.8 Assessment: [name] [name] is here to discuss her progress with her obesity treatment plan along with follow-up of her obesity related diagnoses. See Medical Weight Management Flowsheet for complete bioelectrical impedance results.  Condition  is improving Biometric data collected today, was reviewed with patient.   Since last office visit on 02/20/23 patient's  Muscle mass has increased by 1.2 lb. Fat mass has decreased by 6.4 lb. Total body water has decreased by 3.4 lb.  Counseling done on how various foods will affect these numbers and how to maximize success  Total lbs lost to date: - 12  Total weight loss percentage to date: 4.49    Plan: Continue the Category 3 meal plan.   -  I briefly reviewed the Category 3 meal plan with the patient and reminded her that the order and timing that she eats the foods does not matter as long has she has everything on the plan by the end of the day.   Behavioral Intervention Additional resources provided today: category 3 meal plan information Evidence-based interventions for health behavior change were utilized today including the discussion of self monitoring techniques, problem-solving barriers and SMART goal setting techniques.   Regarding patient's less desirable eating habits and patterns, we employed the technique of small changes.  Pt will specifically work on: continue with prescribed meal plan/ current exercise regiment for next visit.    Recommended Physical Activity Goals  [name] [name] has been advised to slowly work up to 150 minutes of moderate intensity aerobic activity a week and strengthening exercises 2-3 times per week for cardiovascular health, weight loss maintenance and preservation of muscle mass.   She has agreed to Continue current level of physical activity   FOLLOW UP: Return in about 3 weeks (around 04/10/2023). She was informed of the importance of frequent follow up visits to maximize her success with intensive lifestyle modifications for her multiple health conditions.  [name] [name] is aware that we will review all of her lab results at our next visit.  She is aware that if anything is critical/ life threatening with the results, we will be contacting her via MyChart prior to the office visit to discuss management.    Subjective:   Chief complaint: Obesity [name] [name] is here to discuss her progress with her obesity treatment plan. She is on the Category 3 Plan and states she is following her eating plan approximately 92% of the time. She states she is swimming+ walking 45 minutes 3-7 days per week.  Interval History:  [name] [name] is here for a follow up office visit. This is my first time meeting  with [name] [name]; she is a pt of Dr.U.   Since last OV, [name] [name] has been doing well. Lately, she has been eating a lot of yogurt in the morning. She endorses following her meal plan closely and is working on increasing her protein intake. She also reports being more active by adding swimming to her exercise regiment.   Pharmacotherapy for weight loss: She is currently taking  Ozempic  for medical weight loss.  Denies side effects.    Review of Systems:  Pertinent positives were addressed with patient today.  Reviewed by clinician on day of visit: allergies, medications, problem list, medical history, surgical history, family history, social history, and previous encounter notes.  Weight Summary and Biometrics   Weight Lost Since Last Visit: 5lb  Weight Gained Since Last Visit: 0   Vitals Temp: 98.1 F (36.7 C) BP: 110/81 Pulse Rate: 80 SpO2: 95 %   Anthropometric Measurements Height: 5\' 2"  (1.575 m) Weight: 255 lb (115.7 kg) BMI (Calculated): 46.63 Weight at Last Visit: 261lb Weight Lost Since Last Visit: 5lb Weight Gained Since Last Visit:  0 Starting Weight: 267lb Total Weight Loss (lbs): 11 lb (4.99 kg) Peak Weight: 280lb   Body Composition  Body Fat %: 49 % Fat Mass (lbs): 125.4 lbs Muscle Mass (lbs): 123.8 lbs Total Body Water (lbs): 83 lbs Visceral Fat Rating : 17   Other Clinical Data Fasting: yes Labs: yes Today's Visit #: 12 Starting Date: 03/16/22   Objective:   PHYSICAL EXAM: Blood pressure 110/81, pulse 80, temperature 98.1 F (36.7 C), height 5\' 2"  (1.575 m), weight 255 lb (115.7 kg), SpO2 95 %. Body mass index is 46.64 kg/m.  General: Well Developed, well nourished, and in no acute distress.  HEENT: Normocephalic, atraumatic Skin: Warm and dry, cap RF less 2 sec, good turgor Chest:  Normal excursion, shape, no gross abn Respiratory: speaking in full sentences, no conversational dyspnea NeuroM-Sk: Ambulates w/o assistance, moves * 4 Psych: A  and O *3, insight good, mood-full  DIAGNOSTIC DATA REVIEWED:  BMET    Component Value Date/Time   NA 140 09/13/2022 1127   K 4.5 09/13/2022 1127   CL 104 09/13/2022 1127   CO2 25 09/13/2022 1127   GLUCOSE 85 09/13/2022 1127   GLUCOSE 126 (H) 03/05/2020 0924   BUN 9 09/13/2022 1127   CREATININE 0.80 02/13/2023 0926   CALCIUM 9.3 09/13/2022 1127   GFRNONAA >60 03/05/2020 0924   GFRAA >60 03/05/2020 0924   Lab Results  Component Value Date   HGBA1C 5.7 (A) 09/04/2022   HGBA1C 5.8 09/07/2014   Lab Results  Component Value Date   INSULIN 13.2 09/13/2022   INSULIN 20.0 03/20/2022   Lab Results  Component Value Date   TSH 2.620 03/20/2022   CBC    Component Value Date/Time   WBC 7.3 03/20/2022 0841   WBC 6.9 03/05/2020 0924   RBC 4.87 03/20/2022 0841   RBC 4.89 03/05/2020 0924   HGB 12.5 03/20/2022 0841   HCT 40.3 03/20/2022 0841   PLT 390 03/20/2022 0841   MCV 83 03/20/2022 0841   MCH 25.7 (L) 03/20/2022 0841   MCH 25.8 (L) 03/05/2020 0924   MCHC 31.0 (L) 03/20/2022 0841   MCHC 30.6 03/05/2020 0924   RDW 14.5 03/20/2022 0841   Iron Studies    Component Value Date/Time   IRON 36 06/27/2016 1457   TIBC 294 06/27/2016 1457   FERRITIN 140 12/09/2020 1421   IRONPCTSAT 12 (L) 06/27/2016 1457   Lipid Panel     Component Value Date/Time   CHOL 162 09/13/2022 1127   TRIG 53 09/13/2022 1127   HDL 57 09/13/2022 1127   CHOLHDL 2.9 08/03/2021 1031   LDLCALC 94 09/13/2022 1127   Hepatic Function Panel     Component Value Date/Time   PROT 7.0 09/13/2022 1127   ALBUMIN 3.9 09/13/2022 1127   AST 11 09/13/2022 1127   ALT 12 09/13/2022 1127   ALKPHOS 77 09/13/2022 1127   BILITOT 0.4 09/13/2022 1127      Component Value Date/Time   TSH 2.620 03/20/2022 0841   Nutritional Lab Results  Component Value Date   VD25OH 39.9 09/13/2022   VD25OH 22.4 (L) 03/20/2022    Attestations:   I, Special Puri, acting as a Stage manager for Thomasene Lot, DO., have  compiled all relevant documentation for today's office visit on behalf of Thomasene Lot, DO, while in the presence of [employer], DO.  I have reviewed the above documentation for accuracy and completeness, and I agree with the above. [name] [name], D.O.  The 21st Century  Cures Act was signed into law in 2016 which includes the topic of electronic health records.  This provides immediate access to information in MyChart.  This includes consultation notes, operative notes, office notes, lab results and pathology reports.  If you have any questions about what you read please let us know at your next visit so we can discuss your concerns and take corrective action if need be.  We are right here with you.

## 2023-03-21 LAB — COMPREHENSIVE METABOLIC PANEL
ALT: 13 IU/L (ref 0–32)
AST: 15 IU/L (ref 0–40)
Albumin: 4.1 g/dL (ref 3.9–4.9)
Alkaline Phosphatase: 70 IU/L (ref 44–121)
BUN/Creatinine Ratio: 14 (ref 9–23)
BUN: 11 mg/dL (ref 6–24)
Bilirubin Total: 0.3 mg/dL (ref 0.0–1.2)
CO2: 21 mmol/L (ref 20–29)
Calcium: 9.5 mg/dL (ref 8.7–10.2)
Chloride: 106 mmol/L (ref 96–106)
Creatinine, Ser: 0.79 mg/dL (ref 0.57–1.00)
Globulin, Total: 3 g/dL (ref 1.5–4.5)
Glucose: 85 mg/dL (ref 70–99)
Potassium: 4.6 mmol/L (ref 3.5–5.2)
Sodium: 144 mmol/L (ref 134–144)
Total Protein: 7.1 g/dL (ref 6.0–8.5)
eGFR: 91 mL/min/{1.73_m2} (ref >59)

## 2023-03-21 LAB — CBC WITH DIFFERENTIAL/PLATELET
Basophils Absolute: 0.1 10*3/uL (ref 0.0–0.2)
Basos: 1 %
EOS (ABSOLUTE): 0.3 10*3/uL (ref 0.0–0.4)
Eos: 5 %
Hematocrit: 41.4 % (ref 34.0–46.6)
Hemoglobin: 13.1 g/dL (ref 11.1–15.9)
Immature Grans (Abs): 0 10*3/uL (ref 0.0–0.1)
Immature Granulocytes: 0 %
Lymphocytes Absolute: 2.6 10*3/uL (ref 0.7–3.1)
Lymphs: 38 %
MCH: 26.5 pg — ABNORMAL LOW (ref 26.6–33.0)
MCHC: 31.6 g/dL (ref 31.5–35.7)
MCV: 84 fL (ref 79–97)
Monocytes Absolute: 0.5 10*3/uL (ref 0.1–0.9)
Monocytes: 7 %
Neutrophils Absolute: 3.5 10*3/uL (ref 1.4–7.0)
Neutrophils: 49 %
Platelets: 394 10*3/uL (ref 150–450)
RBC: 4.94 x10E6/uL (ref 3.77–5.28)
RDW: 14.4 % (ref 11.7–15.4)
WBC: 6.9 10*3/uL (ref 3.4–10.8)

## 2023-03-21 LAB — T4, FREE: Free T4: 1.04 ng/dL (ref 0.82–1.77)

## 2023-03-21 LAB — HEMOGLOBIN A1C
Est. average glucose Bld gHb Est-mCnc: 126 mg/dL
Hgb A1c MFr Bld: 6 % — ABNORMAL HIGH (ref 4.8–5.6)

## 2023-03-21 LAB — INSULIN, RANDOM: INSULIN: 16.8 u[IU]/mL (ref 2.6–24.9)

## 2023-03-21 LAB — VITAMIN D 25 HYDROXY (VIT D DEFICIENCY, FRACTURES): Vit D, 25-Hydroxy: 49.6 ng/mL (ref 30.0–100.0)

## 2023-03-21 LAB — TSH: TSH: 1.26 u[IU]/mL (ref 0.450–4.500)

## 2023-03-26 DIAGNOSIS — Z419 Encounter for procedure for purposes other than remedying health state, unspecified: Secondary | ICD-10-CM | POA: Diagnosis not present

## 2023-04-05 ENCOUNTER — Encounter (INDEPENDENT_AMBULATORY_CARE_PROVIDER_SITE_OTHER): Payer: Self-pay

## 2023-04-09 NOTE — Progress Notes (Unsigned)
TeleHealth Visit:  This visit was completed with telemedicine (audio/video) technology. [name] [name] has verbally consented to this TeleHealth visit. The patient is located at home, the provider is located at home. The participants in this visit include the listed provider and patient. The visit was conducted today via MyChart video.  OBESITY [name] [name] is here to discuss her progress with her obesity treatment plan along with follow-up of her obesity related diagnoses.   Today's visit was # 13 Starting weight: 267 lbs Starting date: 03/16/22 Weight at last in office visit: 255 lbs on 03/20/23 Total weight loss: 12 lbs at last in office visit on 03/20/23. Today's reported weight (04/10/23): none reported  Nutrition Plan: the Category 3 plan - 95% adherence.  Current exercise:   walking 30-60 minutes 3-7 days per week.   Interim History:  Mostly skips breakfast.  She is not hungry in the am. Eats Yoplait rather than [place] yogurt. Reports she is on plan at lunch and dinner. Appetite is well controlled. Occasional sweet cravings. Water intake - 48 oz per day. Reg soda- 1.5 cans per day.  She has maintained her muscle mass well. Started program with 124.4 pounds of muscle, most recent bioimpedance shows 123.8 pounds of muscle.  Has not been swimming recently, only walking for exercise.  Assessment/Plan:  We discussed recent lab results in depth.  1. Type 2 Diabetes Mellitus with other specified complication, without long-term current use of insulin HgbA1c is at goal. Last A1c was 6, up from 5.7 on 09/04/2022. Medication(s): Ozempic 1 mg SQ weekly.  Dose increased last visit.  Denies nausea or constipation.  Has not noticed change in appetite.  Lab Results  Component Value Date   HGBA1C 6.0 (H) 03/20/2023   HGBA1C 5.7 (A) 09/04/2022   HGBA1C 6.8 (H) 03/20/2022   Lab Results  Component Value Date   LDLCALC 94 09/13/2022   CREATININE 0.79 03/20/2023   No results found for:  "GFR"  Plan: Continue and refill Ozempic 1 mg SQ weekly   2. Vitamin D Deficiency Vitamin D is nearly at goal of 50.  Most recent vitamin D level was 49 on 03/20/2023-up from 39 on 09/13/2022.Marland Kitchen She is on  prescription ergocalciferol 50,000 IU weekly. Lab Results  Component Value Date   VD25OH 49.6 03/20/2023   VD25OH 39.9 09/13/2022   VD25OH 22.4 (L) 03/20/2022    Plan: Continue  prescription ergocalciferol 50,000 IU weekly   3. Morbid Obesity: Current BMI 46 [name] [name] is currently in the action stage of change. As such, her goal is to continue with weight loss efforts.  She has agreed to the Category 3 plan.  1.  Try Sprite Zero 2.  Reduce soda to one mini can per day. 3.  Try Dannon light and fit yogurt. 4.  Discussed for breakfast-May eat later in the day.  Try special K protein cereal with milk. 5.  Discussed importance of resistance training and protein intake for weight loss and maintaining muscle mass.  Exercise goals: Continue walking for exercise.  Add in resistance training or swim instead of walking 2-3 times weekly.  Behavioral modification strategies: increasing lean protein intake, decreasing simple carbohydrates , no meal skipping, decrease liquid calories, increase water intake, and planning for success.  [name] [name] has agreed to follow-up with our clinic in 3 weeks.  No orders of the defined types were placed in this encounter.   Medications Discontinued During This Encounter  Medication Reason   Semaglutide, 1 MG/DOSE, 4 MG/3ML SOPN Reorder  Meds ordered this encounter  Medications   Semaglutide, 1 MG/DOSE, 4 MG/3ML SOPN    Sig: Inject 1 mg as directed once a week.    Dispense:  3 mL    Refill:  0    Order Specific Question:   Supervising Provider    Answer:   Glennis Brink [[id number]]      Objective:   VITALS: Per patient if applicable, see vitals. GENERAL: Alert and in no acute distress. CARDIOPULMONARY: No increased WOB. Speaking in clear  sentences.  PSYCH: Pleasant and cooperative. Speech normal rate and rhythm. Affect is appropriate. Insight and judgement are appropriate. Attention is focused, linear, and appropriate.  NEURO: Oriented as arrived to appointment on time with no prompting.   Attestation Statements:   Reviewed by clinician on day of visit: allergies, medications, problem list, medical history, surgical history, family history, social history, and previous encounter notes.   This was prepared with the assistance of Engineer, civil (consulting).  Occasional wrong-word or sound-a-like substitutions may have occurred due to the inherent limitations of voice recognition software.

## 2023-04-10 ENCOUNTER — Encounter (INDEPENDENT_AMBULATORY_CARE_PROVIDER_SITE_OTHER): Payer: Self-pay | Admitting: Family Medicine

## 2023-04-10 ENCOUNTER — Telehealth (INDEPENDENT_AMBULATORY_CARE_PROVIDER_SITE_OTHER): Payer: Medicaid Other | Admitting: Family Medicine

## 2023-04-10 DIAGNOSIS — Z7985 Long-term (current) use of injectable non-insulin antidiabetic drugs: Secondary | ICD-10-CM | POA: Diagnosis not present

## 2023-04-10 DIAGNOSIS — Z6841 Body Mass Index (BMI) 40.0 and over, adult: Secondary | ICD-10-CM

## 2023-04-10 DIAGNOSIS — E1169 Type 2 diabetes mellitus with other specified complication: Secondary | ICD-10-CM | POA: Diagnosis not present

## 2023-04-10 DIAGNOSIS — E559 Vitamin D deficiency, unspecified: Secondary | ICD-10-CM | POA: Diagnosis not present

## 2023-04-10 MED ORDER — SEMAGLUTIDE (1 MG/DOSE) 4 MG/3ML ~~LOC~~ SOPN: 1.0000 mg | PEN_INJECTOR | SUBCUTANEOUS | 0 refills | Status: DC

## 2023-04-11 ENCOUNTER — Encounter (INDEPENDENT_AMBULATORY_CARE_PROVIDER_SITE_OTHER): Payer: Self-pay

## 2023-04-26 DIAGNOSIS — Z419 Encounter for procedure for purposes other than remedying health state, unspecified: Secondary | ICD-10-CM | POA: Diagnosis not present

## 2023-05-01 ENCOUNTER — Ambulatory Visit (INDEPENDENT_AMBULATORY_CARE_PROVIDER_SITE_OTHER): Payer: Medicaid Other | Admitting: Family Medicine

## 2023-05-15 ENCOUNTER — Telehealth: Payer: Self-pay | Admitting: Neurology

## 2023-05-15 ENCOUNTER — Encounter: Payer: Self-pay | Admitting: Neurology

## 2023-05-15 ENCOUNTER — Ambulatory Visit: Payer: Medicaid Other | Admitting: Neurology

## 2023-05-15 VITALS — BP 126/82 | Ht 62.0 in | Wt 258.0 lb

## 2023-05-15 DIAGNOSIS — G4733 Obstructive sleep apnea (adult) (pediatric): Secondary | ICD-10-CM | POA: Diagnosis not present

## 2023-05-15 NOTE — Patient Instructions (Signed)
Use CPAP nightly, minimum 4 hours, order sleep study  Orders Placed This Encounter  Procedures   Home sleep test   Nocturnal polysomnography

## 2023-05-15 NOTE — Progress Notes (Signed)
Patient: [name] [name] Date of Birth: [birth year]  Reason for Visit: Follow up History from: Patient Primary Neurologist: Frances Furbish  ASSESSMENT AND PLAN 52 y.o. year old female   1.  OSA on CPAP  -Order HST/PSG for reevaluation of sleep apnea in the setting of 40 lb weight loss -For now, recommend nightly usage of CPAP for a minimum of 4 hours for clinical benefit and insurance requirement -I will follow-up with her in 6 weeks via MyChart to pull a new CPAP download -Set up date for new machine was in November 2023, she was already established on CPAP with a baseline sleep study in November 2017 from the hospital sleep lab baseline AHI was 15.8, set up on CPAP January 2018 -After her initial consultation in August 2023 with Dr. Frances Furbish HST was ordered but was denied by insurance, we proceeded to order a new CPAP machine  Orders Placed This Encounter  Procedures   Home sleep test   Nocturnal polysomnography   HISTORY OF PRESENT ILLNESS: Today 05/15/23 Her compliance is not great, she has been traveling, not taking with her. Forgetting to put it on at night. When she wears CPAP, energy is better the next day, non-restorative. Still headaches with CPAP, in the morning, feels more pressure in her head. Thinks less headache when not wearing? Usually in morning headaches improves throughout the day. 1 migraine monthly. On ozempic, working on weight loss. Has cut soda down to 1.5 cans a day. Was drinking 3, 2 L bottles a day. Has lost at least 40 lbs in 1-2 years. Just got new nasal pillow mask, doesn't fit, went back to using FFM. ESS 9. Works as Surveyor, mining.   CPAP report 04/07/23-05/06/23 17/30 days usage at 57%, greater than 4 hours 12 days at 40%.  Average usage days used 5 hours 42 minutes. 7-13 cm water.  Leak 3.7.  AHI 0.5.  HISTORY  Today, October 26, 2022 SS: VV today due to respiratory illness. Here for new CPAP visit, the last several weeks her compliance hasn't been great.  Setup date 07/31/22. HST was ordered but insurance denied. The 1st 2 weeks of January her usage was not great due to illness. She is school bus driver. She got a new machine, but is same type.CPAP is helpful, she is sleeping better, doesn't snore, feels more rested, more energy. Tried nasal pillow mask, wants to go back to full face mask. Feels the nasal pillows are uncomfortable, moves around at night, has to constantly readjust. No issues with machine.    Review of CPAP data 07/31/2022-08/29/22 with usage 29/30 days 97%, greater than 4 hours 25 days at 83%.  Average usage days used 5 hours 26 minutes.  Minimum pressure 7, maximum pressure 13 cm water.  Leak 5.7, AHI 0.5.  95th percentile pressure 10.3.   Initial Visit 05/16/22 Dr. Frances Furbish: I saw your patient, [name] [name], upon your kind request in my sleep clinic today for initial consultation of her sleep disorder, in particular, evaluation of her prior diagnosis of obstructive sleep apnea.  The patient is unaccompanied today.  As you know, [name] [name] is a 52 year old right-handed woman with an underlying medical history of diabetes, hypertension, hyperlipidemia, migraine headaches, arthritis, anemia, asthma, low back pain, and severe obesity with a BMI of over 45, who was previously diagnosed with obstructive sleep apnea and placed on CPAP therapy.  I reviewed your office note from 04/03/2022.  She had sleep testing on 08/07/2016.  I was able to  review her sleep study results.  She had a split-night sleep study and was laying on hospital sleep lab.  Baseline AHI was 15.8/h.  O2 nadir was 86%.  She was titrated from 5 cm to 10 cm at the time.  She was set up on CPAP of 10 cm in January 2018.  I was able to review her latest compliance data for the past 90 days from 02/15/2022 through 05/15/2022, during which time she used her machine 36 out of 90 days with percent use days greater than 4 hours of 28%, indicating suboptimal compliance, average AHI 1.4/h, leak  acceptable with the 95th percentile at 9.8 L/min on a pressure of 10 cm with EPR of 2.  Her DME company is [employer].  Her Epworth sleepiness score is 6 out of 24, fatigue severity score is 24 out of 63.  Her mom has sleep apnea and has a CPAP machine.  Patient has been working on weight loss and has been going to the weight management clinic for the past nearly 3 months.  She has been on Ozempic for about 2 months.  She has drastically reduced her caffeine intake from up to 2 2-liter bottles of soda per day to down to 1-1/2 cans of soda per day.  Interestingly, she has had increase in headaches in the past month and a half it may correlate with her drastic reduction of her caffeine.  She has not been consistent with her CPAP as she felt the headaches were coming from her CPAP machine.  When she first got on her machine she did notice benefit from the treatment.  Her weight was indeed higher in the past when she was first diagnosed with sleep apnea.  Bedtime is generally between 9:30 PM and 10 PM.  Rise time is between 5 and 5:30 AM.  She works as a Designer, industrial/product, lives with her 44 year old son.  She has a boyfriend who stays over from time to time.  She is not a smoker and does not currently drink any alcohol.  She has had trouble with her mask, it does not fit as well.  She has not been in touch with her DME company which is [employer] about this.  She has nocturia about 2-3 times per average night.  She has a TV in her bedroom but does not have it on all night.  They have no pets in the household.   REVIEW OF SYSTEMS: Out of a complete 14 system review of symptoms, the patient complains only of the following symptoms, and all other reviewed systems are negative.  See HPI  ALLERGIES: Allergies  Allergen Reactions   Cat Hair Extract Anaphylaxis   Clorox Nasal Antiseptic [Povidone-Iodine]     Chlorox rash.   Mushroom Extract Complex Hives and Itching    Mushroom as a whole the pt is allergic to.     HOME MEDICATIONS: Outpatient Medications Prior to Visit  Medication Sig Dispense Refill   gabapentin (NEURONTIN) 300 MG capsule Take 1 capsule (300 mg total) by mouth at bedtime. 90 capsule 2   ibuprofen (ADVIL) 400 MG tablet Take 2 tablets (800 mg total) by mouth every 8 (eight) hours as needed. 100 tablet 2   rosuvastatin (CRESTOR) 20 MG tablet Take 1 tablet (20 mg total) by mouth daily. 90 tablet 2   Semaglutide, 1 MG/DOSE, 4 MG/3ML SOPN Inject 1 mg as directed once a week. 3 mL 0   Vitamin D, Ergocalciferol, (DRISDOL) 1.25 MG (50000 UNIT)  CAPS capsule Take 1 capsule (50,000 Units total) by mouth every 7 (seven) days. 4 capsule 0   Accu-Chek Softclix Lancets lancets Use as instructed (Patient not taking: Reported on 05/15/2023) 100 each 12   albuterol (VENTOLIN HFA) 108 (90 Base) MCG/ACT inhaler Inhale 2 puffs into the lungs every 6 (six) hours as needed for wheezing or shortness of breath. (Patient not taking: Reported on 05/15/2023) 8 g 0   benzonatate (TESSALON) 100 MG capsule Take 1 capsule (100 mg total) by mouth every 8 (eight) hours as needed for cough. (Patient not taking: Reported on 05/15/2023) 21 capsule 0   Blood Glucose Monitoring Suppl (ACCU-CHEK GUIDE) w/Device KIT Use to test blood sugar in the morning, Dx E11.9 (Patient not taking: Reported on 05/15/2023) 1 kit 0   glucose blood (ACCU-CHEK GUIDE) test strip USE AS DIRECTED (Patient not taking: Reported on 05/15/2023) 100 each 0   No facility-administered medications prior to visit.    PAST MEDICAL HISTORY: Past Medical History:  Diagnosis Date   Acute respiratory failure with hypoxia (HCC) 06/18/2019   Allergy    SEASONAL   Anemia    Anxiety    Arthritis    "left knee"   Asthma    Back pain    Chronic lower back pain    "on the left side"   Diabetes (HCC)    Food allergy    Gallbladder problem    Headache(784.0) 02/29/2012   "frequent"   High cholesterol    Hyperkalemia 10/30/2018   Hypertension    Left  knee pain 11/04/2015   Migraines    Neuromuscular disorder (HCC)    Siactic   Over weight    Pneumonia 2006   Post viral syndrome 07/17/2019   Renal insufficiency 06/18/2019   Sleep apnea    CPAP   SOB (shortness of breath)    Stress at home    "and at work"    PAST SURGICAL HISTORY: Past Surgical History:  Procedure Laterality Date   BREAST BIOPSY Left 07/29/2013   CESAREAN SECTION  08/2008   CHOLECYSTECTOMY  03/02/2012   Procedure: LAPAROSCOPIC CHOLECYSTECTOMY;  Surgeon: Cherylynn Ridges, MD;  Location[facility] OR;  Service: General;;   KNEE ARTHROSCOPY WITH MENISCAL REPAIR Right 06/25/2018   Procedure: RIGHT KNEE ARTHROSCOPY WITH MENISCAL ROOT REPAIR;  Surgeon: Cammy Copa, MD;  Location: [facility] OR;  Service: Orthopedics;  Laterality: Right;   KNEE ARTHROSCOPY WITH MENISCAL REPAIR Left 03/09/2020   Procedure: LEFT KNEE ARTHROSCOPY, MEDIAL MENISCAL ROOT REPAIR;  Surgeon: Cammy Copa, MD;  Location: [facility] OR;  Service: Orthopedics;  Laterality: Left;   WISDOM TOOTH EXTRACTION      FAMILY HISTORY: Family History  Problem Relation Age of Onset   High blood pressure Mother    Thyroid disease Mother    Sleep apnea Mother    Obesity Mother    Cervical cancer Maternal Grandmother    Cancer Other    Breast cancer Neg Hx    Colon cancer Neg Hx    Colon polyps Neg Hx    Crohn's disease Neg Hx    Esophageal cancer Neg Hx    Rectal cancer Neg Hx    Stomach cancer Neg Hx     SOCIAL HISTORY: Social History   Socioeconomic History   Marital status: Single    Spouse name: Not on file   Number of children: Not on file   Years of education: Not on file   Highest education level: Not on file  Occupational History   Not on file  Tobacco Use   Smoking status: Never    Passive exposure: Current   Smokeless tobacco: Never  Vaping Use   Vaping status: Never Used  Substance and Sexual Activity   Alcohol use: No    Alcohol/week: 0.0 standard drinks of alcohol   Drug use: No    Sexual activity: Not on file  Other Topics Concern   Not on file  Social History Narrative   Caffiene, 1 cup coffee in am, soda's - 1-2 can daily.   Education :  11th grade.   Working; Surveyor, mining.    Social Determinants of Health   Financial Resource Strain: Not on file  Food Insecurity: Not on file  Transportation Needs: Not on file  Physical Activity: Not on file  Stress: Not on file  Social Connections: Not on file  Intimate Partner Violence: Not on file    PHYSICAL EXAM  Vitals:   05/15/23 0819  BP: 126/82  Weight: 258 lb (117 kg)  Height: 5\' 2"  (1.575 m)   Body mass index is 47.19 kg/m.  Generalized: Well developed, in no acute distress  Neurological examination  Mentation: Alert oriented to time, place, history taking. Follows all commands speech and language fluent Cranial nerve II-XII: Pupils were equal round reactive to light. Extraocular movements were full, visual field were full on confrontational test. Facial sensation and strength were normal.  Head turning and shoulder shrug  were normal and symmetric. Motor: The motor testing reveals 5 over 5 strength of all 4 extremities. Good symmetric motor tone is noted throughout.  Sensory: Sensory testing is intact to soft touch on all 4 extremities. No evidence of extinction is noted.  Gait and station: Gait is normal.   DIAGNOSTIC DATA (LABS, IMAGING, TESTING) - I reviewed patient records, labs, notes, testing and imaging myself where available.  Lab Results  Component Value Date   WBC 6.9 03/20/2023   HGB 13.1 03/20/2023   HCT 41.4 03/20/2023   MCV 84 03/20/2023   PLT 394 03/20/2023      Component Value Date/Time   NA 144 03/20/2023 1153   K 4.6 03/20/2023 1153   CL 106 03/20/2023 1153   CO2 21 03/20/2023 1153   GLUCOSE 85 03/20/2023 1153   GLUCOSE 126 (H) 03/05/2020 0924   BUN 11 03/20/2023 1153   CREATININE 0.79 03/20/2023 1153   CALCIUM 9.5 03/20/2023 1153   PROT 7.1 03/20/2023 1153    ALBUMIN 4.1 03/20/2023 1153   AST 15 03/20/2023 1153   ALT 13 03/20/2023 1153   ALKPHOS 70 03/20/2023 1153   BILITOT 0.3 03/20/2023 1153   GFRNONAA >60 03/05/2020 0924   GFRAA >60 03/05/2020 0924   Lab Results  Component Value Date   CHOL 162 09/13/2022   HDL 57 09/13/2022   LDLCALC 94 09/13/2022   TRIG 53 09/13/2022   CHOLHDL 2.9 08/03/2021   Lab Results  Component Value Date   HGBA1C 6.0 (H) 03/20/2023   No results found for: "VITAMINB12" Lab Results  Component Value Date   TSH 1.260 03/20/2023    Margie Ege, AGNP-C, DNP 05/15/2023, 9:06 AM [facility] [address] [place], [place] [address] [phone]

## 2023-05-15 NOTE — Telephone Encounter (Signed)
I did not know this, but her compliance has slipped the last several months. I saw her in Feb and her compliance was satisfactory. If insurance is going to take it, not sure there is anything else we can do unfortunately. I did order sleep study today. She can discuss with her DME about insurance taking it.

## 2023-05-15 NOTE — Telephone Encounter (Signed)
Pt called stating that her DME called and LVM stating that she has to turn in the machine and get qualified for a new one. Pt would like to discuss with RN

## 2023-05-15 NOTE — Telephone Encounter (Signed)
HST [facility] pending uploaded notes

## 2023-05-21 NOTE — Telephone Encounter (Signed)
Checked status on the portal it is still pending.  

## 2023-05-22 ENCOUNTER — Other Ambulatory Visit (INDEPENDENT_AMBULATORY_CARE_PROVIDER_SITE_OTHER): Payer: Self-pay | Admitting: Family Medicine

## 2023-05-22 DIAGNOSIS — E1169 Type 2 diabetes mellitus with other specified complication: Secondary | ICD-10-CM

## 2023-05-23 NOTE — Telephone Encounter (Signed)
HST- [facility] Abbie Sons: [id number] (exp. 05/15/23 to 08/20/23)

## 2023-05-27 DIAGNOSIS — Z419 Encounter for procedure for purposes other than remedying health state, unspecified: Secondary | ICD-10-CM | POA: Diagnosis not present

## 2023-05-29 ENCOUNTER — Other Ambulatory Visit (INDEPENDENT_AMBULATORY_CARE_PROVIDER_SITE_OTHER): Payer: Self-pay | Admitting: Family Medicine

## 2023-05-29 DIAGNOSIS — E1169 Type 2 diabetes mellitus with other specified complication: Secondary | ICD-10-CM

## 2023-05-30 ENCOUNTER — Other Ambulatory Visit (INDEPENDENT_AMBULATORY_CARE_PROVIDER_SITE_OTHER): Payer: Self-pay | Admitting: Family Medicine

## 2023-05-30 DIAGNOSIS — E1169 Type 2 diabetes mellitus with other specified complication: Secondary | ICD-10-CM

## 2023-05-30 DIAGNOSIS — E559 Vitamin D deficiency, unspecified: Secondary | ICD-10-CM

## 2023-05-31 ENCOUNTER — Encounter (INDEPENDENT_AMBULATORY_CARE_PROVIDER_SITE_OTHER): Payer: Self-pay | Admitting: Family Medicine

## 2023-05-31 DIAGNOSIS — E1169 Type 2 diabetes mellitus with other specified complication: Secondary | ICD-10-CM

## 2023-05-31 MED ORDER — SEMAGLUTIDE (1 MG/DOSE) 4 MG/3ML ~~LOC~~ SOPN: 1.0000 mg | PEN_INJECTOR | SUBCUTANEOUS | 0 refills | Status: DC

## 2023-06-05 ENCOUNTER — Ambulatory Visit (INDEPENDENT_AMBULATORY_CARE_PROVIDER_SITE_OTHER): Payer: Medicaid Other | Admitting: Family Medicine

## 2023-06-06 ENCOUNTER — Encounter (INDEPENDENT_AMBULATORY_CARE_PROVIDER_SITE_OTHER): Payer: Self-pay | Admitting: Family Medicine

## 2023-06-06 ENCOUNTER — Ambulatory Visit (INDEPENDENT_AMBULATORY_CARE_PROVIDER_SITE_OTHER): Payer: Medicaid Other | Admitting: Family Medicine

## 2023-06-06 VITALS — BP 103/69 | HR 68 | Temp 98.2°F | Ht 62.0 in | Wt 251.0 lb

## 2023-06-06 DIAGNOSIS — Z7985 Long-term (current) use of injectable non-insulin antidiabetic drugs: Secondary | ICD-10-CM

## 2023-06-06 DIAGNOSIS — Z6841 Body Mass Index (BMI) 40.0 and over, adult: Secondary | ICD-10-CM

## 2023-06-06 DIAGNOSIS — E1165 Type 2 diabetes mellitus with hyperglycemia: Secondary | ICD-10-CM | POA: Diagnosis not present

## 2023-06-06 DIAGNOSIS — E669 Obesity, unspecified: Secondary | ICD-10-CM | POA: Diagnosis not present

## 2023-06-06 DIAGNOSIS — E860 Dehydration: Secondary | ICD-10-CM | POA: Diagnosis not present

## 2023-06-13 NOTE — Progress Notes (Unsigned)
Chief Complaint:   OBESITY [name] [name] is here to discuss her progress with her obesity treatment plan along with follow-up of her obesity related diagnoses. [name] [name] is on the Category 3 Plan and states she is following her eating plan approximately 95% of the time. [name] [name] states she is walking for 45 minutes 7 times per week.  Today's visit was #: 15 Starting weight: 267 lbs Starting date: 03/16/2022 Today's weight: 251 lbs Today's date: 06/06/2023 Total lbs lost to date: 16 Total lbs lost since last in-office visit: 4  Interim History: Patient is doing well with her weight loss.  Her hunger is better controlled and she is doing better with meal planning, and she has increased walking most days.  Subjective:   1. Type 2 diabetes mellitus with hyperglycemia, without long-term current use of insulin (HCC) Patient is on Ozempic with no GI upset.  She is doing well with her weight loss.  2. Dehydration Patient is working on increasing her water intake.  She notes some mild headaches at times, which could be due to dehydration with weight loss.  Assessment/Plan:   1. Type 2 diabetes mellitus with hyperglycemia, without long-term current use of insulin (HCC) Patient will continue Ozempic at 1 mg once weekly and she will continue with her diet.  No refill was needed today.  2. Dehydration Patient will continue to work on increasing her water intake, and we will follow-up at her next visit in 1 month.  3. BMI 45.0-49.9, adult (HCC)  4. Obesity, Beginning BMI of 48.8 [name] [name] is currently in the action stage of change. As such, her goal is to continue with weight loss efforts. She has agreed to the Category 3 Plan.   Exercise goals: As is.   Behavioral modification strategies: increasing lean protein intake.  [name] [name] has agreed to follow-up with our clinic in 4 weeks. She was informed of the importance of frequent follow-up visits to maximize her success with intensive lifestyle  modifications for her multiple health conditions.   Objective:   Blood pressure 103/69, pulse 68, temperature 98.2 F (36.8 C), height 5\' 2"  (1.575 m), weight 251 lb (113.9 kg), SpO2 95%. Body mass index is 45.91 kg/m.  Lab Results  Component Value Date   CREATININE 0.79 03/20/2023   BUN 11 03/20/2023   NA 144 03/20/2023   K 4.6 03/20/2023   CL 106 03/20/2023   CO2 21 03/20/2023   Lab Results  Component Value Date   ALT 13 03/20/2023   AST 15 03/20/2023   ALKPHOS 70 03/20/2023   BILITOT 0.3 03/20/2023   Lab Results  Component Value Date   HGBA1C 6.0 (H) 03/20/2023   HGBA1C 5.7 (A) 09/04/2022   HGBA1C 6.8 (H) 03/20/2022   HGBA1C 6.3 (A) 11/23/2021   HGBA1C 6.7 (A) 08/03/2021   Lab Results  Component Value Date   INSULIN 16.8 03/20/2023   INSULIN 13.2 09/13/2022   INSULIN 20.0 03/20/2022   Lab Results  Component Value Date   TSH 1.260 03/20/2023   Lab Results  Component Value Date   CHOL 162 09/13/2022   HDL 57 09/13/2022   LDLCALC 94 09/13/2022   TRIG 53 09/13/2022   CHOLHDL 2.9 08/03/2021   Lab Results  Component Value Date   VD25OH 49.6 03/20/2023   VD25OH 39.9 09/13/2022   VD25OH 22.4 (L) 03/20/2022   Lab Results  Component Value Date   WBC 6.9 03/20/2023   HGB 13.1 03/20/2023   HCT 41.4 03/20/2023   MCV  84 03/20/2023   PLT 394 03/20/2023   Lab Results  Component Value Date   IRON 36 06/27/2016   TIBC 294 06/27/2016   FERRITIN 140 12/09/2020   Attestation Statements:   Reviewed by clinician on day of visit: allergies, medications, problem list, medical history, surgical history, family history, social history, and previous encounter notes.  Time spent on visit including pre-visit chart review and post-visit care and charting was 30 minutes.   I, Burt Knack, am acting as transcriptionist for Quillian Quince, MD.  I have reviewed the above documentation for accuracy and completeness, and I agree with the above. -  Quillian Quince, MD

## 2023-06-18 ENCOUNTER — Encounter: Payer: Self-pay | Admitting: Neurology

## 2023-06-18 DIAGNOSIS — H40023 Open angle with borderline findings, high risk, bilateral: Secondary | ICD-10-CM | POA: Diagnosis not present

## 2023-06-18 LAB — HM DIABETES EYE EXAM

## 2023-06-26 DIAGNOSIS — Z419 Encounter for procedure for purposes other than remedying health state, unspecified: Secondary | ICD-10-CM | POA: Diagnosis not present

## 2023-07-04 ENCOUNTER — Encounter (INDEPENDENT_AMBULATORY_CARE_PROVIDER_SITE_OTHER): Payer: Self-pay | Admitting: Family Medicine

## 2023-07-04 ENCOUNTER — Ambulatory Visit (INDEPENDENT_AMBULATORY_CARE_PROVIDER_SITE_OTHER): Payer: Medicaid Other | Admitting: Family Medicine

## 2023-07-04 VITALS — BP 117/78 | HR 71 | Temp 98.0°F | Ht 62.0 in | Wt 250.0 lb

## 2023-07-04 DIAGNOSIS — E119 Type 2 diabetes mellitus without complications: Secondary | ICD-10-CM | POA: Insufficient documentation

## 2023-07-04 DIAGNOSIS — E785 Hyperlipidemia, unspecified: Secondary | ICD-10-CM | POA: Diagnosis not present

## 2023-07-04 DIAGNOSIS — Z7985 Long-term (current) use of injectable non-insulin antidiabetic drugs: Secondary | ICD-10-CM | POA: Diagnosis not present

## 2023-07-04 DIAGNOSIS — E1169 Type 2 diabetes mellitus with other specified complication: Secondary | ICD-10-CM | POA: Diagnosis not present

## 2023-07-04 DIAGNOSIS — Z6841 Body Mass Index (BMI) 40.0 and over, adult: Secondary | ICD-10-CM | POA: Diagnosis not present

## 2023-07-04 DIAGNOSIS — E559 Vitamin D deficiency, unspecified: Secondary | ICD-10-CM | POA: Diagnosis not present

## 2023-07-04 DIAGNOSIS — E669 Obesity, unspecified: Secondary | ICD-10-CM | POA: Diagnosis not present

## 2023-07-04 MED ORDER — ROSUVASTATIN CALCIUM 20 MG PO TABS: 20.0000 mg | ORAL_TABLET | Freq: Every day | ORAL | 0 refills | Status: DC

## 2023-07-04 MED ORDER — VITAMIN D (ERGOCALCIFEROL) 1.25 MG (50000 UNIT) PO CAPS: 50000.0000 [IU] | ORAL_CAPSULE | ORAL | 0 refills | Status: DC

## 2023-07-04 MED ORDER — SEMAGLUTIDE (1 MG/DOSE) 4 MG/3ML ~~LOC~~ SOPN: 1.0000 mg | PEN_INJECTOR | SUBCUTANEOUS | 0 refills | Status: DC

## 2023-07-04 MED ORDER — ROSUVASTATIN CALCIUM 20 MG PO TABS: 20.0000 mg | ORAL_TABLET | Freq: Every day | ORAL | 2 refills | Status: DC

## 2023-07-04 NOTE — Progress Notes (Unsigned)
Chief Complaint:   OBESITY [name] [name] is here to discuss her progress with her obesity treatment plan along with follow-up of her obesity related diagnoses. [name] [name] is on the Category 3 Plan and states she is following her eating plan approximately 95% of the time. [name] [name] states she is walking for 30-35 minutes 7 times per week.  Today's visit was #: 16 Starting weight: 267 lbs Starting date: 03/16/2022 Today's weight: 250 lbs Today's date: 07/04/2023 Total lbs lost to date: 17 Total lbs lost since last in-office visit: 1  Interim History: Patient continues to do well with her weight loss.  She is exercising regularly and her hunger is better controlled.  She feels well overall.  Subjective:   1. Type 2 diabetes mellitus with other specified complication, without long-term current use of insulin (HCC) Patient is on Ozempic, and she is working on her diet and weight loss.  She notes feeling low glucose when skipping meals.  2. Vitamin D deficiency Patient is on vitamin D, and she denies nausea, vomiting, or muscle weakness.  She requests a refill today.  3. Hyperlipidemia associated with type 2 diabetes mellitus (HCC) Patient is on Crestor and she is working on her diet.  She denies chest pain or myalgias.  Assessment/Plan:   1. Type 2 diabetes mellitus with other specified complication, without long-term current use of insulin (HCC) We will refill Ozempic 1 mg once weekly for 1 month, and we will recheck labs next month.  Patient will continue with her diet.  - Semaglutide, 1 MG/DOSE, 4 MG/3ML SOPN; Inject 1 mg as directed once a week.  Dispense: 3 mL; Refill: 0  2. Vitamin D deficiency Patient will continue prescription vitamin D 50,000 IU once weekly, and we will refill for 1 month, and we will recheck labs next month.  - Vitamin D, Ergocalciferol, (DRISDOL) 1.25 MG (50000 UNIT) CAPS capsule; Take 1 capsule (50,000 Units total) by mouth every 7 (seven) days.  Dispense: 4  capsule; Refill: 0  3. Hyperlipidemia associated with type 2 diabetes mellitus (HCC) Patient will continue with her diet and Crestor, and we will refill Crestor 20 mg daily for 1 month.  We will recheck labs next month.  - rosuvastatin (CRESTOR) 20 MG tablet; Take 1 tablet (20 mg total) by mouth daily.  Dispense: 30 tablet; Refill: 0  4. BMI 45.0-49.9, adult (HCC)  5. Obesity, Beginning BMI of 48.8 [name] [name] is currently in the action stage of change. As such, her goal is to continue with weight loss efforts. She has agreed to the Category 3 Plan.   Exercise goals: As is.   Behavioral modification strategies: increasing lean protein intake and holiday eating strategies .  [name] [name] has agreed to follow-up with our clinic in 4 weeks. She was informed of the importance of frequent follow-up visits to maximize her success with intensive lifestyle modifications for her multiple health conditions.   Objective:   Blood pressure 117/78, pulse 71, temperature 98 F (36.7 C), height 5\' 2"  (1.575 m), weight 250 lb (113.4 kg), SpO2 97%. Body mass index is 45.73 kg/m.  Lab Results  Component Value Date   CREATININE 0.79 03/20/2023   BUN 11 03/20/2023   NA 144 03/20/2023   K 4.6 03/20/2023   CL 106 03/20/2023   CO2 21 03/20/2023   Lab Results  Component Value Date   ALT 13 03/20/2023   AST 15 03/20/2023   ALKPHOS 70 03/20/2023   BILITOT 0.3 03/20/2023   Lab Results  Component Value Date   HGBA1C 6.0 (H) 03/20/2023   HGBA1C 5.7 (A) 09/04/2022   HGBA1C 6.8 (H) 03/20/2022   HGBA1C 6.3 (A) 11/23/2021   HGBA1C 6.7 (A) 08/03/2021   Lab Results  Component Value Date   INSULIN 16.8 03/20/2023   INSULIN 13.2 09/13/2022   INSULIN 20.0 03/20/2022   Lab Results  Component Value Date   TSH 1.260 03/20/2023   Lab Results  Component Value Date   CHOL 162 09/13/2022   HDL 57 09/13/2022   LDLCALC 94 09/13/2022   TRIG 53 09/13/2022   CHOLHDL 2.9 08/03/2021   Lab Results  Component  Value Date   VD25OH 49.6 03/20/2023   VD25OH 39.9 09/13/2022   VD25OH 22.4 (L) 03/20/2022   Lab Results  Component Value Date   WBC 6.9 03/20/2023   HGB 13.1 03/20/2023   HCT 41.4 03/20/2023   MCV 84 03/20/2023   PLT 394 03/20/2023   Lab Results  Component Value Date   IRON 36 06/27/2016   TIBC 294 06/27/2016   FERRITIN 140 12/09/2020   Attestation Statements:   Reviewed by clinician on day of visit: allergies, medications, problem list, medical history, surgical history, family history, social history, and previous encounter notes.   I, Burt Knack, am acting as transcriptionist for Quillian Quince, MD.  I have reviewed the above documentation for accuracy and completeness, and I agree with the above. -  Quillian Quince, MD

## 2023-07-06 ENCOUNTER — Ambulatory Visit: Payer: Medicaid Other | Admitting: Student

## 2023-07-06 VITALS — BP 123/89 | HR 87 | Temp 97.9°F | Ht 62.0 in | Wt 257.4 lb

## 2023-07-06 DIAGNOSIS — J309 Allergic rhinitis, unspecified: Secondary | ICD-10-CM

## 2023-07-06 DIAGNOSIS — Z23 Encounter for immunization: Secondary | ICD-10-CM | POA: Diagnosis not present

## 2023-07-06 DIAGNOSIS — I10 Essential (primary) hypertension: Secondary | ICD-10-CM

## 2023-07-06 DIAGNOSIS — E119 Type 2 diabetes mellitus without complications: Secondary | ICD-10-CM

## 2023-07-06 DIAGNOSIS — G4733 Obstructive sleep apnea (adult) (pediatric): Secondary | ICD-10-CM

## 2023-07-06 DIAGNOSIS — E559 Vitamin D deficiency, unspecified: Secondary | ICD-10-CM | POA: Diagnosis not present

## 2023-07-06 DIAGNOSIS — Z6841 Body Mass Index (BMI) 40.0 and over, adult: Secondary | ICD-10-CM | POA: Diagnosis not present

## 2023-07-06 DIAGNOSIS — E1165 Type 2 diabetes mellitus with hyperglycemia: Secondary | ICD-10-CM

## 2023-07-06 DIAGNOSIS — Z0000 Encounter for general adult medical examination without abnormal findings: Secondary | ICD-10-CM

## 2023-07-06 DIAGNOSIS — Z7985 Long-term (current) use of injectable non-insulin antidiabetic drugs: Secondary | ICD-10-CM | POA: Diagnosis not present

## 2023-07-06 LAB — POCT GLYCOSYLATED HEMOGLOBIN (HGB A1C): Hemoglobin A1C: 5.6 % (ref 4.0–5.6)

## 2023-07-06 LAB — GLUCOSE, CAPILLARY: Glucose-Capillary: 98 mg/dL (ref 70–99)

## 2023-07-06 MED ORDER — FLUTICASONE PROPIONATE 50 MCG/ACT NA SUSP
1.0000 | Freq: Every day | NASAL | 3 refills | Status: DC
Start: 2023-07-06 — End: 2023-10-04

## 2023-07-06 MED ORDER — BENZONATATE 100 MG PO CAPS
100.0000 mg | ORAL_CAPSULE | Freq: Four times a day (QID) | ORAL | 0 refills | Status: AC | PRN
Start: 2023-07-06 — End: ?

## 2023-07-06 MED ORDER — CHOLECALCIFEROL 20 MCG (800 UNIT) PO TABS: 20.0000 ug | ORAL_TABLET | Freq: Every day | ORAL | 3 refills | Status: DC

## 2023-07-06 MED ORDER — CETIRIZINE HCL 10 MG PO TABS
10.0000 mg | ORAL_TABLET | Freq: Every day | ORAL | 2 refills | Status: AC
Start: 2023-07-06 — End: 2024-08-20

## 2023-07-06 NOTE — Patient Instructions (Signed)
  Thank you, Ms.Titiana E Cahue, for allowing [place] to provide your care today.  You are doing a great job managing her diabetes!  Your A1c is 5.6.  As we discussed if you are doing well on the current 1 mg dose of the Ozempic for over a month we can increase this which would give you more weight loss benefit.  You could also discuss this with your healthy weight and wellness clinic.  For your cough please take daily cetirizine and Flonase nasal spray as well as the [employer].  Once your cough improves I would recommend that you continue at least the daily cetirizine and if the Flonase is not giving you problems you may also continue this.  I have changed your vitamin D to a daily pill at a lower dose.  If you have any questions about this please do not hesitate to give [place] a call.  I am glad that you have good plan follow-ups with sleep medicine for a sleep study and with your eye doctor.  We will see you again in 3 months for a Pap smear and to check on your diabetes.  I have ordered the following labs for you:  Lab Orders         Glucose, capillary         POC Hbg A1C        Follow up: 3 months    Remember:     Should you have any questions or concerns please call the internal medicine clinic at [phone].     Rocky Morel, DO [facility]

## 2023-07-06 NOTE — Progress Notes (Signed)
CC: Routine Follow Up   HPI:  [name] [name] is a 52 y.o. female with pertinent PMH of HTN, OSA, T2DM, obesity, and chronic lumbar radiculopathy who presents to the clinic for routine follow-up after being seen 02/07/2023. Please see assessment and plan below for further details.  Past Medical History:  Diagnosis Date   Acute respiratory failure with hypoxia (HCC) 06/18/2019   Allergy    SEASONAL   Anemia    Anxiety    Arthritis    "left knee"   Asthma    Back pain    Chronic lower back pain    "on the left side"   Diabetes (HCC)    Food allergy    Gallbladder problem    Headache(784.0) 02/29/2012   "frequent"   High cholesterol    Hyperkalemia 10/30/2018   Hypertension    Left knee pain 11/04/2015   Migraines    Neuromuscular disorder (HCC)    Siactic   Over weight    Pneumonia 2006   Post viral syndrome 07/17/2019   Renal insufficiency 06/18/2019   Sleep apnea    CPAP   SOB (shortness of breath)    Stress at home    "and at work"    Current Outpatient Medications  Medication Instructions   albuterol (VENTOLIN HFA) 108 (90 Base) MCG/ACT inhaler 2 puffs, Inhalation, Every 6 hours PRN   benzonatate (TESSALON PERLES) 100 mg, Oral, Every 6 hours PRN   cetirizine (ZYRTEC ALLERGY) 10 mg, Oral, Daily   Cholecalciferol 20 mcg, Oral, Daily   fluticasone (FLONASE) 50 MCG/ACT nasal spray 1 spray, Each Nare, Daily   gabapentin (NEURONTIN) 300 mg, Oral, Daily at bedtime   ibuprofen (ADVIL) 800 mg, Oral, Every 8 hours PRN   rosuvastatin (CRESTOR) 20 mg, Oral, Daily   Semaglutide (1 MG/DOSE) 1 mg, Injection, Weekly     Review of Systems:   Pertinent items noted in HPI and/or A&P.  Physical Exam:  Vitals:   07/06/23 1005  BP: 123/89  Pulse: 87  Temp: 97.9 F (36.6 C)  TempSrc: Oral  SpO2: 100%  Weight: 257 lb 6.4 oz (116.8 kg)  Height: 5\' 2"  (1.575 m)    Constitutional: Tired appearing middle-aged female. In no acute distress. HEENT: Normocephalic,  atraumatic, Sclera non-icteric, PERRL, EOM intact.  No erythema or exudates in the oropharynx. Cardio:Regular rate and rhythm. 2+ bilateral radial pulses. Pulm:Clear to auscultation bilaterally. Normal work of breathing on room air. Abdomen: Soft, non-tender, non-distended, positive bowel sounds. ZOX:WRUEAVWU for extremity edema. Skin:Warm and dry. Neuro:Alert and oriented x3. No focal deficit noted. Psych:Pleasant mood and affect.   Assessment & Plan:   History of hypertension Blood pressure 123/89 today.  Continue to monitor off antihypertensive medication.  OSA (obstructive sleep apnea) She has not had her CPAP machine for about a month after not meeting the [facility] requirements for use.  Has a planned repeat sleep study on 07/10/2023.  Type 2 diabetes mellitus with hyperglycemia, without long-term current use of insulin (HCC) A1c 5.6 today down from 6.0.  She continues to do very well on Ozempic 1 mg weekly which was increased about 1 month ago.  She follows with healthy weight and wellness who increased her dose.  Discussed increasing the dose further but she is slightly wary of the side effects so we will not increase today. - Continue Ozempic 1 mg weekly, if she desires to increase we can modify prescription - Continue gabapentin 300 mg at bedtime - Continue with planned ophthalmology  visit 07/11/2023  Vitamin D deficiency Vitamin D checked 3 months ago was within normal limits at 49.6.  Patient had been taking weekly high-dose vitamin D but we will change to daily vitamin D supplementation. - Colecalciferol 20 mcg daily  Morbid obesity (HCC) Continues to follow with healthy weight and wellness and is doing well on Ozempic 1 mg weekly.  Plan to increase to 2 mg weekly after she has been stable on 1 mg weekly for at least 6 weeks.  Health care maintenance Discussed overdue Pap smear today but due to her coughing and congestion she would like to not do this today.  She would  like to come back for her regular follow-up and do the Pap smear then. - Return in 3 months for Pap smear  Allergic rhinitis Patient comes in with concern of 1 week of a dry cough without fevers, sputum production, or dyspnea.  This has accompanying nasal and sinus congestion.  This usually happens around this time a year for her.  She does not take any daily allergy medication.  She requests Tessalon Perles. - Start cetirizine 10 mg daily, Flonase, and short course of Tessalon Perles    Patient discussed with Dr. Duwayne Heck, DO [facility] Internal [facility] Resident PGY-2 Clinic Phone: [phone] Pager: [phone]

## 2023-07-08 NOTE — Assessment & Plan Note (Addendum)
A1c 5.6 today down from 6.0.  She continues to do very well on Ozempic 1 mg weekly which was increased about 1 month ago.  She follows with healthy weight and wellness who increased her dose.  Discussed increasing the dose further but she is slightly wary of the side effects so we will not increase today. - Continue Ozempic 1 mg weekly, if she desires to increase we can modify prescription - Continue gabapentin 300 mg at bedtime - Continue with planned ophthalmology visit 07/11/2023

## 2023-07-08 NOTE — Assessment & Plan Note (Signed)
Patient comes in with concern of 1 week of a dry cough without fevers, sputum production, or dyspnea.  This has accompanying nasal and sinus congestion.  This usually happens around this time a year for her.  She does not take any daily allergy medication.  She requests Tessalon Perles. - Start cetirizine 10 mg daily, Flonase, and short course of Tessalon Perles

## 2023-07-08 NOTE — Assessment & Plan Note (Signed)
Vitamin D checked 3 months ago was within normal limits at 49.6.  Patient had been taking weekly high-dose vitamin D but we will change to daily vitamin D supplementation. - Colecalciferol 20 mcg daily

## 2023-07-08 NOTE — Assessment & Plan Note (Signed)
Continues to follow with healthy weight and wellness and is doing well on Ozempic 1 mg weekly.  Plan to increase to 2 mg weekly after she has been stable on 1 mg weekly for at least 6 weeks.

## 2023-07-08 NOTE — Assessment & Plan Note (Signed)
Discussed overdue Pap smear today but due to her coughing and congestion she would like to not do this today.  She would like to come back for her regular follow-up and do the Pap smear then. - Return in 3 months for Pap smear

## 2023-07-08 NOTE — Assessment & Plan Note (Addendum)
Blood pressure 123/89 today.  Continue to monitor off antihypertensive medication.

## 2023-07-08 NOTE — Assessment & Plan Note (Signed)
She has not had her CPAP machine for about a month after not meeting the [facility] requirements for use.  Has a planned repeat sleep study on 07/10/2023.

## 2023-07-09 NOTE — Progress Notes (Signed)
Internal Medicine Clinic Attending  Case discussed with the resident at the time of the visit.  We reviewed the resident's history and exam and pertinent patient test results.  I agree with the assessment, diagnosis, and plan of care documented in the resident's note.    Please prioritize Pap at next visit.

## 2023-07-10 ENCOUNTER — Ambulatory Visit: Payer: Medicaid Other | Admitting: Neurology

## 2023-07-10 DIAGNOSIS — G4733 Obstructive sleep apnea (adult) (pediatric): Secondary | ICD-10-CM | POA: Diagnosis not present

## 2023-07-11 NOTE — Progress Notes (Unsigned)
See procedure note.

## 2023-07-12 NOTE — Procedures (Signed)
[facility]  HOME SLEEP TEST (Watch PAT) REPORT  STUDY DATE: 07/10/23  DOB: 12-02-1970  MRN: [id number]  ORDERING CLINICIAN: Huston Foley, MD, PhD   REFERRING CLINICIAN: Margie Ege, NP  CLINICAL INFORMATION/HISTORY: 52 year old right-handed woman with an underlying medical history of diabetes, hypertension, hyperlipidemia, migraine headaches, arthritis, anemia, asthma, low back pain, and severe obesity with a BMI of over 45, who presents for re-evaluation of her OSA. She currently does not use a PAP machine.  Epworth sleepiness score: 9/24.  BMI: 47.1 kg/m  FINDINGS:   Sleep Summary:   Total Recording Time (hours, min): 9 hours, 54 min  Total Sleep Time (hours, min):  8 hours, 22 min  Percent REM (%):    28.4%   Respiratory Indices:   Calculated pAHI (per hour):  26.9/hour         REM pAHI:    40.2/hour       NREM pAHI: 21.6/hour  Central pAHI: 1.5/hour  Oxygen Saturation Statistics:    Oxygen Saturation (%) Mean: 93%   Minimum oxygen saturation (%):                 83%   O2 Saturation Range (%): 83 - 98%    O2 Saturation (minutes) <=88%: 3.2 min  Pulse Rate Statistics:   Pulse Mean (bpm):    83/min    Pulse Range (69 - 112/min)   IMPRESSION: OSA (obstructive sleep apnea), moderate   RECOMMENDATION:  This home sleep test demonstrates moderate obstructive sleep apnea with a total AHI of 26.9/hour and O2 nadir of 83%.  Moderate to loud snoring was detected, at times milder. Treatment with a positive airway pressure (PAP) device is recommended. The patient will be advised to proceed with an autoPAP titration/trial at home for now.  I would recommend settings of minimum pressure of 6 cm, maximum pressure of 13 cm with EPR of 2, mask of choice, sized to fit.  A full night titration study may be considered to optimize treatment settings, monitor proper oxygen saturations and aid with improvement of tolerance and adherence, if needed down  the road. Alternative treatment options may include a dental device through dentistry or orthodontics in selected patients or Inspire (hypoglossal nerve stimulator) in carefully selected patients (meeting inclusion criteria).  Concomitant weight loss is recommended (where clinically appropriate). Please note that untreated obstructive sleep apnea may carry additional perioperative morbidity. Patients with significant obstructive sleep apnea should receive perioperative PAP therapy and the surgeons and particularly the anesthesiologist should be informed of the diagnosis and the severity of the sleep disordered breathing. The patient should be cautioned not to drive, work at heights, or operate dangerous or heavy equipment when tired or sleepy. Review and reiteration of good sleep hygiene measures should be pursued with any patient. Other causes of the patient's symptoms, including circadian rhythm disturbances, an underlying mood disorder, medication effect and/or an underlying medical problem cannot be ruled out based on this test. Clinical correlation is recommended.  The patient and her referring provider will be notified of the test results. The patient will be seen in follow up in sleep clinic at [facility].  I certify that I have reviewed the raw data recording prior to the issuance of this report in accordance with the standards of the American Academy of Sleep Medicine (AASM).    INTERPRETING PHYSICIAN:   Huston Foley, MD, PhD Medical Director, [facility]) Diplomat, ABPN (Neurology and Sleep)   [facility]  [address] [place], [place] [address] [phone]

## 2023-07-16 ENCOUNTER — Telehealth: Payer: Self-pay | Admitting: Neurology

## 2023-07-16 DIAGNOSIS — G4733 Obstructive sleep apnea (adult) (pediatric): Secondary | ICD-10-CM

## 2023-07-16 NOTE — Telephone Encounter (Signed)
The call went directly to voicemail. I left the patient a message requesting for her to return our call.

## 2023-07-16 NOTE — Telephone Encounter (Signed)
Please call, HST 07/10/2023 showed moderate OSA with total AHI 26.9/hour.  Recommend proceeding with AutoPap 6-13 cm, EPR 2, mask of choice.  She has used CPAP before, needs to be aware of compliance requirements.  Will need to be seen within 30 to 90 days of starting CPAP. Weight loss is recommended.   Orders Placed This Encounter  Procedures   For home use only DME continuous positive airway pressure (CPAP)     RECOMMENDATION:  This home sleep test demonstrates moderate obstructive sleep apnea with a total AHI of 26.9/hour and O2 nadir of 83%.  Moderate to loud snoring was detected, at times milder. Treatment with a positive airway pressure (PAP) device is recommended. The patient will be advised to proceed with an autoPAP titration/trial at home for now.  I would recommend settings of minimum pressure of 6 cm, maximum pressure of 13 cm with EPR of 2, mask of choice, sized to fit.  A full night titration study may be considered to optimize treatment settings, monitor proper oxygen saturations and aid with improvement of tolerance and adherence, if needed down the road. Alternative treatment options may include a dental device through dentistry or orthodontics in selected patients or Inspire (hypoglossal nerve stimulator) in carefully selected patients (meeting inclusion criteria).  Concomitant weight loss is recommended (where clinically appropriate). Please note that untreated obstructive sleep apnea may carry additional perioperative morbidity. Patients with significant obstructive sleep apnea should receive perioperative PAP therapy and the surgeons and particularly the anesthesiologist should be informed of the diagnosis and the severity of the sleep disordered breathing. The patient should be cautioned not to drive, work at heights, or operate dangerous or heavy equipment when tired or sleepy. Review and reiteration of good sleep hygiene measures should be pursued with any patient. Other causes of  the patient's symptoms, including circadian rhythm disturbances, an underlying mood disorder, medication effect and/or an underlying medical problem cannot be ruled out based on this test. Clinical correlation is recommended.  The patient and her referring provider will be notified of the test results. The patient will be seen in follow up in sleep clinic at [facility].

## 2023-07-17 NOTE — Telephone Encounter (Signed)
Second attempt to contact patient. Message went directly to voicemail. Mychart message sent.

## 2023-07-17 NOTE — Telephone Encounter (Signed)
Pt is requesting a call to discuss results

## 2023-07-18 NOTE — Telephone Encounter (Signed)
Call To patient and reviewed sleep results. She verbalized understanding. She is in agreement for autopap machine and would like order sent to [facility]. Community message sent to [facility] making aware. Appointment scheduled within 30-90 range

## 2023-07-26 ENCOUNTER — Telehealth: Payer: Self-pay | Admitting: Internal Medicine

## 2023-07-26 NOTE — Telephone Encounter (Signed)
Unable to reach patient to talk about disability placard received. I do not see indication for this on chart review. Both phone listed were called. Unable to leave VM.

## 2023-07-27 ENCOUNTER — Other Ambulatory Visit (INDEPENDENT_AMBULATORY_CARE_PROVIDER_SITE_OTHER): Payer: Self-pay | Admitting: Family Medicine

## 2023-07-27 DIAGNOSIS — E1169 Type 2 diabetes mellitus with other specified complication: Secondary | ICD-10-CM

## 2023-07-27 DIAGNOSIS — Z419 Encounter for procedure for purposes other than remedying health state, unspecified: Secondary | ICD-10-CM | POA: Diagnosis not present

## 2023-08-02 ENCOUNTER — Encounter (INDEPENDENT_AMBULATORY_CARE_PROVIDER_SITE_OTHER): Payer: Self-pay | Admitting: Family Medicine

## 2023-08-02 ENCOUNTER — Ambulatory Visit (INDEPENDENT_AMBULATORY_CARE_PROVIDER_SITE_OTHER): Payer: Medicaid Other | Admitting: Family Medicine

## 2023-08-02 VITALS — BP 115/71 | HR 74 | Temp 98.2°F | Ht 62.0 in | Wt 250.0 lb

## 2023-08-02 DIAGNOSIS — E1169 Type 2 diabetes mellitus with other specified complication: Secondary | ICD-10-CM | POA: Diagnosis not present

## 2023-08-02 DIAGNOSIS — Z6841 Body Mass Index (BMI) 40.0 and over, adult: Secondary | ICD-10-CM

## 2023-08-02 DIAGNOSIS — Z7985 Long-term (current) use of injectable non-insulin antidiabetic drugs: Secondary | ICD-10-CM

## 2023-08-02 DIAGNOSIS — E559 Vitamin D deficiency, unspecified: Secondary | ICD-10-CM | POA: Diagnosis not present

## 2023-08-02 DIAGNOSIS — E785 Hyperlipidemia, unspecified: Secondary | ICD-10-CM

## 2023-08-02 DIAGNOSIS — E669 Obesity, unspecified: Secondary | ICD-10-CM

## 2023-08-02 MED ORDER — SEMAGLUTIDE (1 MG/DOSE) 4 MG/3ML ~~LOC~~ SOPN: 1.0000 mg | PEN_INJECTOR | SUBCUTANEOUS | 0 refills | Status: DC

## 2023-08-02 MED ORDER — ROSUVASTATIN CALCIUM 20 MG PO TABS
20.0000 mg | ORAL_TABLET | Freq: Every day | ORAL | 0 refills | Status: DC
Start: 2023-08-02 — End: 2023-10-04

## 2023-08-02 NOTE — Progress Notes (Signed)
.sm[facility]  Office: [phone]  /  Fax: [phone]  WEIGHT SUMMARY AND BIOMETRICS  Anthropometric Measurements Height: 5\' 2"  (1.575 m) Weight: 250 lb (113.4 kg) BMI (Calculated): 45.71 Weight at Last Visit: 250 lb Weight Lost Since Last Visit: 0 Weight Gained Since Last Visit: 0 Starting Weight: 267 lb Total Weight Loss (lbs): 17 lb (7.711 kg)   Body Composition  Body Fat %: 49.2 % Fat Mass (lbs): 123 lbs Muscle Mass (lbs): 120.6 lbs Total Body Water (lbs): 83.6 lbs Visceral Fat Rating : 16   Other Clinical Data Fasting: Yes Labs: Yes Today's Visit #: 17 Starting Date: 03/16/22    Chief Complaint: OBESITY    History of Present Illness   The patient, diagnosed with type two diabetes, hyperlipidemia, vitamin D deficiency, and obesity, has been actively managing these conditions through diet and exercise. Over the past month, she has successfully maintained her weight and adhered to her diet plan 95% of the time. She engages in walking exercises for twenty to thirty minutes daily.  She is currently on Crestor for hyperlipidemia and Ozempic for type two diabetes, with no reported gastrointestinal side effects from the latter. However, she has recently experienced an increase in sugar cravings.  The patient's last visit with her previous doctor involved discussions about potentially increasing the dosage of her medication, but she decided to wait until the results of her upcoming lab tests. She has also been experiencing allergies, despite having received the flu shot.  In terms of lifestyle, the patient recently navigated the temptations of Halloween without significant issues and is planning for a mindful approach to the upcoming Thanksgiving holiday. She plans to limit her intake of leftovers to healthier options like vegetables, [place], and ham, avoiding sweets and high-carb foods.          PHYSICAL EXAM:  Blood pressure 115/71, pulse 74, temperature 98.2 F (36.8 C),  height 5\' 2"  (1.575 m), weight 250 lb (113.4 kg), SpO2 98%. Body mass index is 45.73 kg/m.  DIAGNOSTIC DATA REVIEWED:  BMET    Component Value Date/Time   NA 144 03/20/2023 1153   K 4.6 03/20/2023 1153   CL 106 03/20/2023 1153   CO2 21 03/20/2023 1153   GLUCOSE 85 03/20/2023 1153   GLUCOSE 126 (H) 03/05/2020 0924   BUN 11 03/20/2023 1153   CREATININE 0.79 03/20/2023 1153   CALCIUM 9.5 03/20/2023 1153   GFRNONAA >60 03/05/2020 0924   GFRAA >60 03/05/2020 0924   Lab Results  Component Value Date   HGBA1C 5.6 07/06/2023   HGBA1C 5.8 09/07/2014   Lab Results  Component Value Date   INSULIN 16.8 03/20/2023   INSULIN 20.0 03/20/2022   Lab Results  Component Value Date   TSH 1.260 03/20/2023   CBC    Component Value Date/Time   WBC 6.9 03/20/2023 1153   WBC 6.9 03/05/2020 0924   RBC 4.94 03/20/2023 1153   RBC 4.89 03/05/2020 0924   HGB 13.1 03/20/2023 1153   HCT 41.4 03/20/2023 1153   PLT 394 03/20/2023 1153   MCV 84 03/20/2023 1153   MCH 26.5 (L) 03/20/2023 1153   MCH 25.8 (L) 03/05/2020 0924   MCHC 31.6 03/20/2023 1153   MCHC 30.6 03/05/2020 0924   RDW 14.4 03/20/2023 1153   Iron Studies    Component Value Date/Time   IRON 36 06/27/2016 1457   TIBC 294 06/27/2016 1457   FERRITIN 140 12/09/2020 1421   IRONPCTSAT 12 (L) 06/27/2016 1457   Lipid Panel  Component Value Date/Time   CHOL 162 09/13/2022 1127   TRIG 53 09/13/2022 1127   HDL 57 09/13/2022 1127   CHOLHDL 2.9 08/03/2021 1031   LDLCALC 94 09/13/2022 1127   Hepatic Function Panel     Component Value Date/Time   PROT 7.1 03/20/2023 1153   ALBUMIN 4.1 03/20/2023 1153   AST 15 03/20/2023 1153   ALT 13 03/20/2023 1153   ALKPHOS 70 03/20/2023 1153   BILITOT 0.3 03/20/2023 1153      Component Value Date/Time   TSH 1.260 03/20/2023 1153   Nutritional Lab Results  Component Value Date   VD25OH 49.6 03/20/2023   VD25OH 39.9 09/13/2022   VD25OH 22.4 (L) 03/20/2022     Assessment and  Plan    Type 2 Diabetes Patient is on Ozempic and tolerating it well. No GI side effects reported. Discussed potential dose adjustment based on upcoming lab results and patient's clinical response. -Check labs today to assess diabetes control. -Refill Ozempic prescription. -Discuss potential dose adjustment at next visit based on lab results.  Hyperlipidemia Patient is on Crestor and requests a refill. -Refill Crestor prescription.  Vitamin D Deficiency No specific discussion in this visit. -Continue current management.  Obesity Patient has maintained weight since last visit and is adhering to diet and exercise plan. Discussed strategies for managing diet during the holiday season. -Continue current Category 3 diet and exercise regimen. -Plan for mindful eating during the holiday season to prevent weight gain.  General Health Maintenance -Plan follow-up visit in January.          She was informed of the importance of frequent follow up visits to maximize her success with intensive lifestyle modifications for her multiple health conditions.    Quillian Quince, MD

## 2023-08-03 LAB — HEMOGLOBIN A1C
Est. average glucose Bld gHb Est-mCnc: 128 mg/dL
Hgb A1c MFr Bld: 6.1 % — ABNORMAL HIGH (ref 4.8–5.6)

## 2023-08-03 LAB — CMP14+EGFR
ALT: 10 [IU]/L (ref 0–32)
AST: 19 [IU]/L (ref 0–40)
Albumin: 4.3 g/dL (ref 3.9–4.9)
Alkaline Phosphatase: 84 [IU]/L (ref 44–121)
BUN/Creatinine Ratio: 13 (ref 9–23)
BUN: 11 mg/dL (ref 6–24)
Bilirubin Total: 0.4 mg/dL (ref 0.0–1.2)
CO2: 22 mmol/L (ref 20–29)
Calcium: 9.4 mg/dL (ref 8.7–10.2)
Chloride: 105 mmol/L (ref 96–106)
Creatinine, Ser: 0.83 mg/dL (ref 0.57–1.00)
Globulin, Total: 3.3 g/dL (ref 1.5–4.5)
Glucose: 88 mg/dL (ref 70–99)
Potassium: 4.3 mmol/L (ref 3.5–5.2)
Sodium: 143 mmol/L (ref 134–144)
Total Protein: 7.6 g/dL (ref 6.0–8.5)
eGFR: 86 mL/min/{1.73_m2} (ref >59)

## 2023-08-03 LAB — LIPID PANEL WITH LDL/HDL RATIO
Cholesterol, Total: 172 mg/dL (ref 100–199)
HDL: 65 mg/dL (ref >39)
LDL Chol Calc (NIH): 95 mg/dL (ref 0–99)
LDL/HDL Ratio: 1.5 ratio (ref 0.0–3.2)
Triglycerides: 60 mg/dL (ref 0–149)
VLDL Cholesterol Cal: 12 mg/dL (ref 5–40)

## 2023-08-03 LAB — VITAMIN B12: Vitamin B-12: 442 pg/mL (ref 232–1245)

## 2023-08-03 LAB — MICROALBUMIN / CREATININE URINE RATIO
Creatinine, Urine: 275.7 mg/dL
Microalb/Creat Ratio: 3 mg/g{creat} (ref 0–29)
Microalbumin, Urine: 7.1 ug/mL

## 2023-08-03 LAB — VITAMIN D 25 HYDROXY (VIT D DEFICIENCY, FRACTURES): Vit D, 25-Hydroxy: 30.3 ng/mL (ref 30.0–100.0)

## 2023-08-03 LAB — INSULIN, RANDOM: INSULIN: 16.7 u[IU]/mL (ref 2.6–24.9)

## 2023-08-16 DIAGNOSIS — G4733 Obstructive sleep apnea (adult) (pediatric): Secondary | ICD-10-CM | POA: Diagnosis not present

## 2023-08-22 ENCOUNTER — Encounter: Payer: Self-pay | Admitting: Student

## 2023-08-22 NOTE — Telephone Encounter (Signed)
Care team updated and letter sent for eye exam notes.

## 2023-08-24 ENCOUNTER — Other Ambulatory Visit (INDEPENDENT_AMBULATORY_CARE_PROVIDER_SITE_OTHER): Payer: Self-pay | Admitting: Family Medicine

## 2023-08-24 DIAGNOSIS — E1169 Type 2 diabetes mellitus with other specified complication: Secondary | ICD-10-CM

## 2023-08-26 DIAGNOSIS — G4733 Obstructive sleep apnea (adult) (pediatric): Secondary | ICD-10-CM | POA: Diagnosis not present

## 2023-08-26 DIAGNOSIS — Z419 Encounter for procedure for purposes other than remedying health state, unspecified: Secondary | ICD-10-CM | POA: Diagnosis not present

## 2023-09-06 ENCOUNTER — Encounter (INDEPENDENT_AMBULATORY_CARE_PROVIDER_SITE_OTHER): Payer: Self-pay | Admitting: Family Medicine

## 2023-09-06 ENCOUNTER — Ambulatory Visit (INDEPENDENT_AMBULATORY_CARE_PROVIDER_SITE_OTHER): Payer: Medicaid Other | Admitting: Family Medicine

## 2023-09-06 VITALS — BP 111/76 | HR 74 | Temp 98.1°F | Ht 62.0 in | Wt 251.0 lb

## 2023-09-06 DIAGNOSIS — E119 Type 2 diabetes mellitus without complications: Secondary | ICD-10-CM

## 2023-09-06 DIAGNOSIS — E1169 Type 2 diabetes mellitus with other specified complication: Secondary | ICD-10-CM

## 2023-09-06 DIAGNOSIS — M1710 Unilateral primary osteoarthritis, unspecified knee: Secondary | ICD-10-CM

## 2023-09-06 DIAGNOSIS — E559 Vitamin D deficiency, unspecified: Secondary | ICD-10-CM | POA: Diagnosis not present

## 2023-09-06 DIAGNOSIS — Z6841 Body Mass Index (BMI) 40.0 and over, adult: Secondary | ICD-10-CM

## 2023-09-06 DIAGNOSIS — Z7985 Long-term (current) use of injectable non-insulin antidiabetic drugs: Secondary | ICD-10-CM

## 2023-09-06 DIAGNOSIS — E669 Obesity, unspecified: Secondary | ICD-10-CM

## 2023-09-06 MED ORDER — SEMAGLUTIDE (1 MG/DOSE) 4 MG/3ML ~~LOC~~ SOPN: 1.0000 mg | PEN_INJECTOR | SUBCUTANEOUS | 0 refills | Status: DC

## 2023-09-06 MED ORDER — VITAMIN D (ERGOCALCIFEROL) 1.25 MG (50000 UNIT) PO CAPS: 50000.0000 [IU] | ORAL_CAPSULE | ORAL | 0 refills | Status: DC

## 2023-09-06 NOTE — Progress Notes (Signed)
.[facility]  Office: [phone]  /  Fax: [phone]  WEIGHT SUMMARY AND BIOMETRICS  Anthropometric Measurements Height: 5\' 2"  (1.575 m) Weight: 251 lb (113.9 kg) BMI (Calculated): 45.9 Weight at Last Visit: 250 lb Weight Lost Since Last Visit: 0 Weight Gained Since Last Visit: 1 lb Starting Weight: 267 lb Total Weight Loss (lbs): 16 lb (7.258 kg)   Body Composition  Body Fat %: 49.7 % Fat Mass (lbs): 125 lbs Muscle Mass (lbs): 120.2 lbs Total Body Water (lbs): 82.4 lbs Visceral Fat Rating : 17   Other Clinical Data Fasting: No Labs: No Today's Visit #: 18 Starting Date: 03/16/22    Chief Complaint: OBESITY   History of Present Illness   The patient, diagnosed with type 2 diabetes and obesity, presents for a routine follow-up and medication refill. She is currently on Ozempic 1 mg weekly for diabetes management. Despite adherence to a category three eating plan and weekly walking exercises, she has gained one pound over the past month. She reports late-night cravings for sweets, particularly during the holiday season.  The patient also reports aches and pains due to arthritis in the knees, exacerbated by cold weather. She manages the pain with Tylenol and has reduced her intake of gabapentin, previously prescribed along with muscle relaxers. She has completely discontinued the muscle relaxers. The patient also experiences restless legs at night, which she attributes to tendon issues.  The patient's adherence to her medication regimen is inconsistent, particularly with her weekly dose of prescription vitamin D. She is also on a weekly dose of Ozempic for her diabetes. Despite these challenges, her recent lab results show well-controlled diabetes with a hemoglobin A1c of 6.1, stable insulin levels, and no early signs of kidney damage from diabetes in her urine test. Her cholesterol levels are satisfactory, but her vitamin D levels have dropped slightly.          PHYSICAL  EXAM:  Blood pressure 111/76, pulse 74, temperature 98.1 F (36.7 C), height 5\' 2"  (1.575 m), weight 251 lb (113.9 kg), SpO2 98%. Body mass index is 45.91 kg/m.  DIAGNOSTIC DATA REVIEWED:  BMET    Component Value Date/Time   NA 143 08/02/2023 1055   K 4.3 08/02/2023 1055   CL 105 08/02/2023 1055   CO2 22 08/02/2023 1055   GLUCOSE 88 08/02/2023 1055   GLUCOSE 126 (H) 03/05/2020 0924   BUN 11 08/02/2023 1055   CREATININE 0.83 08/02/2023 1055   CALCIUM 9.4 08/02/2023 1055   GFRNONAA >60 03/05/2020 0924   GFRAA >60 03/05/2020 0924   Lab Results  Component Value Date   HGBA1C 6.1 (H) 08/02/2023   HGBA1C 5.8 09/07/2014   Lab Results  Component Value Date   INSULIN 16.7 08/02/2023   INSULIN 20.0 03/20/2022   Lab Results  Component Value Date   TSH 1.260 03/20/2023   CBC    Component Value Date/Time   WBC 6.9 03/20/2023 1153   WBC 6.9 03/05/2020 0924   RBC 4.94 03/20/2023 1153   RBC 4.89 03/05/2020 0924   HGB 13.1 03/20/2023 1153   HCT 41.4 03/20/2023 1153   PLT 394 03/20/2023 1153   MCV 84 03/20/2023 1153   MCH 26.5 (L) 03/20/2023 1153   MCH 25.8 (L) 03/05/2020 0924   MCHC 31.6 03/20/2023 1153   MCHC 30.6 03/05/2020 0924   RDW 14.4 03/20/2023 1153   Iron Studies    Component Value Date/Time   IRON 36 06/27/2016 1457   TIBC 294 06/27/2016 1457   FERRITIN  140 12/09/2020 1421   IRONPCTSAT 12 (L) 06/27/2016 1457   Lipid Panel     Component Value Date/Time   CHOL 172 08/02/2023 1055   TRIG 60 08/02/2023 1055   HDL 65 08/02/2023 1055   CHOLHDL 2.9 08/03/2021 1031   LDLCALC 95 08/02/2023 1055   Hepatic Function Panel     Component Value Date/Time   PROT 7.6 08/02/2023 1055   ALBUMIN 4.3 08/02/2023 1055   AST 19 08/02/2023 1055   ALT 10 08/02/2023 1055   ALKPHOS 84 08/02/2023 1055   BILITOT 0.4 08/02/2023 1055      Component Value Date/Time   TSH 1.260 03/20/2023 1153   Nutritional Lab Results  Component Value Date   VD25OH 30.3 08/02/2023    VD25OH 49.6 03/20/2023   VD25OH 39.9 09/13/2022     Assessment and Plan    Type 2 Diabetes Mellitus Well-controlled with an HbA1c of 6.1. Currently on Ozempic 1 mg weekly, managing with diet and exercise. Goal is to keep HbA1c below 7 to prevent complications such as retinopathy, neuropathy, and nephropathy. Discussed maintaining HbA1c below 6.5 as a buffer. - Refill Ozempic 1 mg weekly - Continue current diet and exercise regimen - Schedule follow-up appointment for January 9th  Obesity Recent weight gain of 1 pound over Thanksgiving. Following a Category Three eating plan 95% of the time and walking 1-2 times per week. Discussed benefits of increased protein intake and reducing high-calorie leftovers. - Continue Category Three eating plan - Continue walking 1-2 times per week  Knee Osteoarthritis Pain exacerbated by cold weather. Managing with Tylenol, reduced use of gabapentin and muscle relaxers. Discussed potential benefits of magnesium supplements for restless legs. - Consider magnesium supplement (gummy form) at night for restless legs - Continue Tylenol as needed for pain  Vitamin D Deficiency Levels falling as days get shorter. Difficulty remembering weekly prescription. Discussed tying intake to Ozempic schedule to improve adherence. - Refill prescription vitamin D - Tie vitamin D intake to Ozempic schedule  General Health Maintenance Up to date. Recent labs show good electrolyte balance, kidney function, liver function, and cholesterol levels. B12 levels adequate. - Continue current health maintenance regimen  Follow-up - Attend follow-up appointment on January 9th at 9:20 AM - Schedule follow-up appointment for February.        She was informed of the importance of frequent follow up visits to maximize her success with intensive lifestyle modifications for her multiple health conditions.    Quillian Quince, MD

## 2023-09-15 DIAGNOSIS — G4733 Obstructive sleep apnea (adult) (pediatric): Secondary | ICD-10-CM | POA: Diagnosis not present

## 2023-09-26 DIAGNOSIS — Z419 Encounter for procedure for purposes other than remedying health state, unspecified: Secondary | ICD-10-CM | POA: Diagnosis not present

## 2023-09-26 DIAGNOSIS — G4733 Obstructive sleep apnea (adult) (pediatric): Secondary | ICD-10-CM | POA: Diagnosis not present

## 2023-10-04 ENCOUNTER — Ambulatory Visit (INDEPENDENT_AMBULATORY_CARE_PROVIDER_SITE_OTHER): Payer: Medicaid Other | Admitting: Family Medicine

## 2023-10-04 ENCOUNTER — Encounter (INDEPENDENT_AMBULATORY_CARE_PROVIDER_SITE_OTHER): Payer: Self-pay | Admitting: Family Medicine

## 2023-10-04 VITALS — BP 117/80 | HR 81 | Temp 98.4°F | Ht 62.0 in | Wt 251.0 lb

## 2023-10-04 DIAGNOSIS — Z7985 Long-term (current) use of injectable non-insulin antidiabetic drugs: Secondary | ICD-10-CM | POA: Diagnosis not present

## 2023-10-04 DIAGNOSIS — E1165 Type 2 diabetes mellitus with hyperglycemia: Secondary | ICD-10-CM

## 2023-10-04 DIAGNOSIS — E559 Vitamin D deficiency, unspecified: Secondary | ICD-10-CM

## 2023-10-04 DIAGNOSIS — E1169 Type 2 diabetes mellitus with other specified complication: Secondary | ICD-10-CM | POA: Diagnosis not present

## 2023-10-04 DIAGNOSIS — E785 Hyperlipidemia, unspecified: Secondary | ICD-10-CM

## 2023-10-04 DIAGNOSIS — Z6841 Body Mass Index (BMI) 40.0 and over, adult: Secondary | ICD-10-CM

## 2023-10-04 DIAGNOSIS — E669 Obesity, unspecified: Secondary | ICD-10-CM | POA: Diagnosis not present

## 2023-10-04 MED ORDER — SEMAGLUTIDE (1 MG/DOSE) 4 MG/3ML ~~LOC~~ SOPN: 1.0000 mg | PEN_INJECTOR | SUBCUTANEOUS | 0 refills | Status: DC

## 2023-10-04 MED ORDER — VITAMIN D (ERGOCALCIFEROL) 1.25 MG (50000 UNIT) PO CAPS: 50000.0000 [IU] | ORAL_CAPSULE | ORAL | 0 refills | Status: DC

## 2023-10-04 MED ORDER — ROSUVASTATIN CALCIUM 20 MG PO TABS: 20.0000 mg | ORAL_TABLET | Freq: Every day | ORAL | 0 refills | Status: DC

## 2023-10-04 NOTE — Assessment & Plan Note (Signed)
 Patient doing well on Ozempic  1mg .  She is able to get all food in during the day but still has to focus to do so.  She denies hunger or cravings.  She needs a refill of the Ozempic  today.  We discussed that increasing her dosage would make it harder to get all food and nutrition in and she is agreeable to stay at current dose.

## 2023-10-04 NOTE — Progress Notes (Signed)
 SUBJECTIVE:  Chief Complaint: Obesity  Interim History: Patient has been seeing Dr. Verdon and has not seen me since May of 2024.  She mentions her summer was lazy.  The fall was more focused on losing weight and food intake.  Over the holidays she has been more indulgent than previously.  She recognizes this.  Over the next month she is starting to get back to walking more consistently.    [name] [name] is here to discuss her progress with her obesity treatment plan. She is on the Category 3 Plan and states she is following her eating plan approximately 95 % of the time. She states she is walking.    OBJECTIVE: Visit Diagnoses: Problem List Items Addressed This Visit       Endocrine   Type 2 diabetes mellitus with hyperglycemia, without long-term current use of insulin  (HCC) - Primary   Patient doing well on Ozempic  1mg .  She is able to get all food in during the day but still has to focus to do so.  She denies hunger or cravings.  She needs a refill of the Ozempic  today.  We discussed that increasing her dosage would make it harder to get all food and nutrition in and she is agreeable to stay at current dose.      Relevant Medications   Semaglutide , 1 MG/DOSE, 4 MG/3ML SOPN   rosuvastatin  (CRESTOR ) 20 MG tablet   Hyperlipidemia associated with type 2 diabetes mellitus (HCC)   Relevant Medications   Semaglutide , 1 MG/DOSE, 4 MG/3ML SOPN   rosuvastatin  (CRESTOR ) 20 MG tablet     Other   Vitamin D  deficiency (Chronic)   On Rx Vitamin D .  Last vitamin d  level of 30.3 in November.  She needs a refill today.  Denies nausea, vomiting, muscle weakness.  Discussed importance of vitamin d  supplementation.  Vitamin d  supplementation has been shown to decrease fatigue, decrease risk of progression to insulin  resistance and then prediabetes, decreases risk of falling in older age and can even assist in decreasing depressive symptoms in PTSD.   Prescription for Vitamin D  sent in.        Relevant  Medications   Vitamin D , Ergocalciferol , (DRISDOL ) 1.25 MG (50000 UNIT) CAPS capsule   Other Visit Diagnoses       Type 2 diabetes mellitus with other specified complication, without long-term current use of insulin  (HCC)       Relevant Medications   Semaglutide , 1 MG/DOSE, 4 MG/3ML SOPN   rosuvastatin  (CRESTOR ) 20 MG tablet       No data recorded  No data recorded  No data recorded  No data recorded    ASSESSMENT AND PLAN:  Diet: [name] [name] is currently in the action stage of change. As such, her goal is to continue with weight loss efforts. She has agreed to Category 3 Plan.  Feels confident she will be able to get more consistently on meal plan over the next few weeks.  Exercise: [name] [name] has been instructed that some exercise is better than none for weight loss and overall health benefits.   Behavior Modification:  We discussed the following Behavioral Modification Strategies today: increasing lean protein intake, increasing vegetables, no skipping meals, meal planning and cooking strategies, and better snacking choices.   Return in about 3 weeks (around 10/25/2023).[name] [name] She was informed of the importance of frequent follow up visits to maximize her success with intensive lifestyle modifications for her multiple health conditions.  Attestation Statements:   Reviewed by facilities manager on  day of visit: allergies, medications, problem list, medical history, surgical history, family history, social history, and previous encounter notes.      [name] Cho, MD

## 2023-10-04 NOTE — Progress Notes (Deleted)
   SUBJECTIVE:  Chief Complaint: Obesity  Interim History: ***  [name] [name] is here to discuss her progress with her obesity treatment plan. She is on the Category 3 Plan and states she is following her eating plan approximately   SUBJECTIVE:  Chief Complaint: Obesity  Interim History: ***  [name] [name] is here to discuss her progress with her obesity treatment plan. She is on the Category 3 Plan and states she is following her eating plan approximately 90-95 % of the time. She states she is walking.   OBJECTIVE: Visit Diagnoses: Problem List Items Addressed This Visit   None   No data recorded No data recorded No data recorded No data recorded   ASSESSMENT AND PLAN:  Diet: [name] [name] {CHL AMB IS/IS NOT:210130109} currently in the action stage of change. As such, her goal is to {HWW Weight Loss Efforts:210964006}. She {HAS HAS WNU:81165} agreed to {HWW Weight Loss Plan:210964005}.  Exercise: [name] [name] has been instructed {HWW Exercise:210964007} for weight loss and overall health benefits.   Behavior Modification:  We discussed the following Behavioral Modification Strategies today: {HWW Behavior Modification:210964008}. We discussed various medication options to help [name] [name] with her weight loss efforts and we both agreed to ***.  No follow-ups on file.SABRA She was informed of the importance of frequent follow up visits to maximize her success with intensive lifestyle modifications for her multiple health conditions.  Attestation Statements:   Reviewed by clinician on day of visit: allergies, medications, problem list, medical history, surgical history, family history, social history, and previous encounter notes.   Time spent on visit including pre-visit chart review and post-visit care and charting was *** minutes.      OBJECTIVE: Visit Diagnoses: Problem List Items Addressed This Visit   None   No data recorded No data recorded No data recorded No data  recorded   ASSESSMENT AND PLAN:  Diet: [name] [name] {CHL AMB IS/IS NOT:210130109} currently in the action stage of change. As such, her goal is to {HWW Weight Loss Efforts:210964006}. She {HAS HAS WNU:81165} agreed to {HWW Weight Loss Plan:210964005}.  Exercise: [name] [name] has been instructed {HWW Exercise:210964007} for weight loss and overall health benefits.   Behavior Modification:  We discussed the following Behavioral Modification Strategies today: {HWW Behavior Modification:210964008}. We discussed various medication options to help [name] [name] with her weight loss efforts and we both agreed to ***.  No follow-ups on file.SABRA She was informed of the importance of frequent follow up visits to maximize her success with intensive lifestyle modifications for her multiple health conditions.  Attestation Statements:   Reviewed by clinician on day of visit: allergies, medications, problem list, medical history, surgical history, family history, social history, and previous encounter notes.   Time spent on visit including pre-visit chart review and post-visit care and charting was *** minutes.   [name] [name] Cho, MD

## 2023-10-08 ENCOUNTER — Encounter: Payer: Medicaid Other | Admitting: Student

## 2023-10-10 NOTE — Assessment & Plan Note (Signed)
 On Rx Vitamin D .  Last vitamin d  level of 30.3 in November.  She needs a refill today.  Denies nausea, vomiting, muscle weakness.  Discussed importance of vitamin d  supplementation.  Vitamin d  supplementation has been shown to decrease fatigue, decrease risk of progression to insulin  resistance and then prediabetes, decreases risk of falling in older age and can even assist in decreasing depressive symptoms in PTSD.   Prescription for Vitamin D  sent in.

## 2023-10-16 DIAGNOSIS — G4733 Obstructive sleep apnea (adult) (pediatric): Secondary | ICD-10-CM | POA: Diagnosis not present

## 2023-10-17 ENCOUNTER — Encounter: Payer: Self-pay | Admitting: Anesthesiology

## 2023-10-17 NOTE — Progress Notes (Deleted)
Patient: [name] [name] Date of Birth: 07/20/1971  Reason for Visit: Follow up History from: Patient Primary Neurologist: [name] [name] Virtual Visit via Video Note  I connected with [name] [name] on 10/17/23 at 12:45 PM EST by a video enabled telemedicine application and verified that I am speaking with the correct person using two identifiers.  Location: Patient: *** Provider: ***   I discussed the limitations of evaluation and management by telemedicine and the availability of in person appointments. The patient expressed understanding and agreed to proceed.  ASSESSMENT AND PLAN 53 y.o. year old female   1.  OSA on CPAP  -Order HST/PSG for reevaluation of sleep apnea in the setting of 40 lb weight loss -For now, recommend nightly usage of CPAP for a minimum of 4 hours for clinical benefit and insurance requirement -I will follow-up with her in 6 weeks via MyChart to pull a new CPAP download -Set up date for new machine was in November 2023, she was already established on CPAP with a baseline sleep study in November 2017 from the hospital sleep lab baseline AHI was 15.8, set up on CPAP January 2018 -After her initial consultation in August 2023 with Dr. Frances [name] HST was ordered but was denied by insurance, we proceeded to order a new CPAP machine  No orders of the defined types were placed in this encounter.  HISTORY OF PRESENT ILLNESS: Today 10/17/23 Her prior CPAP was taken due to noncompliance. HST 07/10/2023 showed moderate OSA with total AHI 26.9/hour. 08/16/23 setup date.   05/15/23 SS: Her compliance is not great, she has been traveling, not taking with her. Forgetting to put it on at night. When she wears CPAP, energy is better the next day, non-restorative. Still headaches with CPAP, in the morning, feels more pressure in her head. Thinks less headache when not wearing? Usually in morning headaches improves throughout the day. 1 migraine monthly. On ozempic, working on weight  loss. Has cut soda down to 1.5 cans a day. Was drinking 3, 2 L bottles a day. Has lost at least 40 lbs in 1-2 years. Just got new nasal pillow mask, doesn't fit, went back to using FFM. ESS 9. Works as Surveyor, mining.   CPAP report 04/07/23-05/06/23 17/30 days usage at 57%, greater than 4 hours 12 days at 40%.  Average usage days used 5 hours 42 minutes. 7-13 cm water.  Leak 3.7.  AHI 0.5.  HISTORY  Today, October 26, 2022 SS: VV today due to respiratory illness. Here for new CPAP visit, the last several weeks her compliance hasn't been great. Setup date 07/31/22. HST was ordered but insurance denied. The 1st 2 weeks of January her usage was not great due to illness. She is school bus driver. She got a new machine, but is same type.CPAP is helpful, she is sleeping better, doesn't snore, feels more rested, more energy. Tried nasal pillow mask, wants to go back to full face mask. Feels the nasal pillows are uncomfortable, moves around at night, has to constantly readjust. No issues with machine.    Review of CPAP data 07/31/2022-08/29/22 with usage 29/30 days 97%, greater than 4 hours 25 days at 83%.  Average usage days used 5 hours 26 minutes.  Minimum pressure 7, maximum pressure 13 cm water.  Leak 5.7, AHI 0.5.  95th percentile pressure 10.3.   Initial Visit 05/16/22 Dr. Frances [name]: I saw your patient, [name] [name], upon your kind request in my sleep clinic today for initial consultation of her  sleep disorder, in particular, evaluation of her prior diagnosis of obstructive sleep apnea.  The patient is unaccompanied today.  As you know, [name] [name] is a 53 year old right-handed woman with an underlying medical history of diabetes, hypertension, hyperlipidemia, migraine headaches, arthritis, anemia, asthma, low back pain, and severe obesity with a BMI of over 45, who was previously diagnosed with obstructive sleep apnea and placed on CPAP therapy.  I reviewed your office note from 04/03/2022.  She had sleep testing  on 08/07/2016.  I was able to review her sleep study results.  She had a split-night sleep study and was laying on hospital sleep lab.  Baseline AHI was 15.8/h.  O2 nadir was 86%.  She was titrated from 5 cm to 10 cm at the time.  She was set up on CPAP of 10 cm in January 2018.  I was able to review her latest compliance data for the past 90 days from 02/15/2022 through 05/15/2022, during which time she used her machine 36 out of 90 days with percent use days greater than 4 hours of 28%, indicating suboptimal compliance, average AHI 1.4/h, leak acceptable with the 95th percentile at 9.8 L/min on a pressure of 10 cm with EPR of 2.  Her DME company is [employer].  Her Epworth sleepiness score is 6 out of 24, fatigue severity score is 24 out of 63.  Her mom has sleep apnea and has a CPAP machine.  Patient has been working on weight loss and has been going to the weight management clinic for the past nearly 3 months.  She has been on Ozempic for about 2 months.  She has drastically reduced her caffeine intake from up to 2 2-liter bottles of soda per day to down to 1-1/2 cans of soda per day.  Interestingly, she has had increase in headaches in the past month and a half it may correlate with her drastic reduction of her caffeine.  She has not been consistent with her CPAP as she felt the headaches were coming from her CPAP machine.  When she first got on her machine she did notice benefit from the treatment.  Her weight was indeed higher in the past when she was first diagnosed with sleep apnea.  Bedtime is generally between 9:30 PM and 10 PM.  Rise time is between 5 and 5:30 AM.  She works as a Designer, industrial/product, lives with her 2 year old son.  She has a boyfriend who stays over from time to time.  She is not a smoker and does not currently drink any alcohol.  She has had trouble with her mask, it does not fit as well.  She has not been in touch with her DME company which is [employer] about this.  She has nocturia  about 2-3 times per average night.  She has a TV in her bedroom but does not have it on all night.  They have no pets in the household.   REVIEW OF SYSTEMS: Out of a complete 14 system review of symptoms, the patient complains only of the following symptoms, and all other reviewed systems are negative.  See HPI  ALLERGIES: Allergies  Allergen Reactions   Cat Dander Anaphylaxis   Clorox Nasal Antiseptic [Povidone-Iodine]     Chlorox rash.   Mushroom Extract Complex (Do Not Select) Hives and Itching    Mushroom as a whole the pt is allergic to.    HOME MEDICATIONS: Outpatient Medications Prior to Visit  Medication Sig Dispense Refill  albuterol (VENTOLIN HFA) 108 (90 Base) MCG/ACT inhaler Inhale 2 puffs into the lungs every 6 (six) hours as needed for wheezing or shortness of breath. 8 g 0   benzonatate (TESSALON PERLES) 100 MG capsule Take 1 capsule (100 mg total) by mouth every 6 (six) hours as needed for cough. 30 capsule 0   cetirizine (ZYRTEC ALLERGY) 10 MG tablet Take 1 tablet (10 mg total) by mouth daily. 30 tablet 2   Cholecalciferol 20 MCG (800 UNIT) TABS Take 20 mcg by mouth daily. 90 tablet 3   gabapentin (NEURONTIN) 300 MG capsule Take 1 capsule (300 mg total) by mouth at bedtime. 90 capsule 2   rosuvastatin (CRESTOR) 20 MG tablet Take 1 tablet (20 mg total) by mouth daily. 30 tablet 0   Semaglutide, 1 MG/DOSE, 4 MG/3ML SOPN Inject 1 mg as directed once a week. 3 mL 0   Vitamin D, Ergocalciferol, (DRISDOL) 1.25 MG (50000 UNIT) CAPS capsule Take 1 capsule (50,000 Units total) by mouth every 7 (seven) days. 4 capsule 0   No facility-administered medications prior to visit.    PAST MEDICAL HISTORY: Past Medical History:  Diagnosis Date   Acute respiratory failure with hypoxia (HCC) 06/18/2019   Allergy    SEASONAL   Anemia    Anxiety    Arthritis    "left knee"   Asthma    Back pain    Chronic lower back pain    "on the left side"   Diabetes (HCC)    Food allergy     Gallbladder problem    Headache(784.0) 02/29/2012   "frequent"   High cholesterol    Hyperkalemia 10/30/2018   Hypertension    Left knee pain 11/04/2015   Migraines    Neuromuscular disorder (HCC)    Siactic   Over weight    Pneumonia 2006   Post viral syndrome 07/17/2019   Renal insufficiency 06/18/2019   Sleep apnea    CPAP   SOB (shortness of breath)    Stress at home    "and at work"    PAST SURGICAL HISTORY: Past Surgical History:  Procedure Laterality Date   BREAST BIOPSY Left 07/29/2013   CESAREAN SECTION  08/2008   CHOLECYSTECTOMY  03/02/2012   Procedure: LAPAROSCOPIC CHOLECYSTECTOMY;  Surgeon: Cherylynn Ridges, MD;  Location: [facility] OR;  Service: General;;   KNEE ARTHROSCOPY WITH MENISCAL REPAIR Right 06/25/2018   Procedure: RIGHT KNEE ARTHROSCOPY WITH MENISCAL ROOT REPAIR;  Surgeon: Cammy Copa, MD;  Location: [facility] OR;  Service: Orthopedics;  Laterality: Right;   KNEE ARTHROSCOPY WITH MENISCAL REPAIR Left 03/09/2020   Procedure: LEFT KNEE ARTHROSCOPY, MEDIAL MENISCAL ROOT REPAIR;  Surgeon: Cammy Copa, MD;  Location: [facility] OR;  Service: Orthopedics;  Laterality: Left;   WISDOM TOOTH EXTRACTION      FAMILY HISTORY: Family History  Problem Relation Age of Onset   High blood pressure Mother    Thyroid disease Mother    Sleep apnea Mother    Obesity Mother    Cervical cancer Maternal Grandmother    Cancer Other    Breast cancer Neg Hx    Colon cancer Neg Hx    Colon polyps Neg Hx    Crohn's disease Neg Hx    Esophageal cancer Neg Hx    Rectal cancer Neg Hx    Stomach cancer Neg Hx     SOCIAL HISTORY: Social History   Socioeconomic History   Marital status: Single    Spouse name: Not on file  Number of children: Not on file   Years of education: Not on file   Highest education level: Not on file  Occupational History   Not on file  Tobacco Use   Smoking status: Never    Passive exposure: Current   Smokeless tobacco: Never  Vaping Use    Vaping status: Never Used  Substance and Sexual Activity   Alcohol use: No    Alcohol/week: 0.0 standard drinks of alcohol   Drug use: No   Sexual activity: Not on file  Other Topics Concern   Not on file  Social History Narrative   Caffiene, 1 cup coffee in am, soda's - 1-2 can daily.   Education :  11th grade.   Working; Surveyor, mining.    Social Drivers of Corporate investment banker Strain: Not on file  Food Insecurity: Not on file  Transportation Needs: Not on file  Physical Activity: Not on file  Stress: Not on file  Social Connections: Not on file  Intimate Partner Violence: Not on file    PHYSICAL EXAM  There were no vitals filed for this visit.  There is no height or weight on file to calculate BMI.  Generalized: Well developed, in no acute distress  Neurological examination  Mentation: Alert oriented to time, place, history taking. Follows all commands speech and language fluent Cranial nerve II-XII: Pupils were equal round reactive to light. Extraocular movements were full, visual field were full on confrontational test. Facial sensation and strength were normal.  Head turning and shoulder shrug  were normal and symmetric. Motor: The motor testing reveals 5 over 5 strength of all 4 extremities. Good symmetric motor tone is noted throughout.  Sensory: Sensory testing is intact to soft touch on all 4 extremities. No evidence of extinction is noted.  Gait and station: Gait is normal.   DIAGNOSTIC DATA (LABS, IMAGING, TESTING) - I reviewed patient records, labs, notes, testing and imaging myself where available.  Lab Results  Component Value Date   WBC 6.9 03/20/2023   HGB 13.1 03/20/2023   HCT 41.4 03/20/2023   MCV 84 03/20/2023   PLT 394 03/20/2023      Component Value Date/Time   NA 143 08/02/2023 1055   K 4.3 08/02/2023 1055   CL 105 08/02/2023 1055   CO2 22 08/02/2023 1055   GLUCOSE 88 08/02/2023 1055   GLUCOSE 126 (H) 03/05/2020 0924   BUN 11  08/02/2023 1055   CREATININE 0.83 08/02/2023 1055   CALCIUM 9.4 08/02/2023 1055   PROT 7.6 08/02/2023 1055   ALBUMIN 4.3 08/02/2023 1055   AST 19 08/02/2023 1055   ALT 10 08/02/2023 1055   ALKPHOS 84 08/02/2023 1055   BILITOT 0.4 08/02/2023 1055   GFRNONAA >60 03/05/2020 0924   GFRAA >60 03/05/2020 0924   Lab Results  Component Value Date   CHOL 172 08/02/2023   HDL 65 08/02/2023   LDLCALC 95 08/02/2023   TRIG 60 08/02/2023   CHOLHDL 2.9 08/03/2021   Lab Results  Component Value Date   HGBA1C 6.1 (H) 08/02/2023   Lab Results  Component Value Date   VITAMINB12 442 08/02/2023   Lab Results  Component Value Date   TSH 1.260 03/20/2023    Margie Ege, AGNP-C, DNP 10/17/2023, 2:16 PM [facility] [address] [place], [place] [address] [phone]

## 2023-10-18 ENCOUNTER — Telehealth: Payer: Self-pay | Admitting: Neurology

## 2023-10-18 ENCOUNTER — Telehealth: Payer: Medicaid Other | Admitting: Neurology

## 2023-10-18 NOTE — Telephone Encounter (Signed)
MYC cancellation

## 2023-10-22 ENCOUNTER — Encounter: Payer: Self-pay | Admitting: Student

## 2023-10-22 ENCOUNTER — Ambulatory Visit: Payer: Medicaid Other | Admitting: Student

## 2023-10-22 ENCOUNTER — Other Ambulatory Visit: Payer: Self-pay

## 2023-10-22 VITALS — BP 122/68 | HR 79 | Temp 98.0°F | Ht 62.0 in | Wt 258.3 lb

## 2023-10-22 DIAGNOSIS — Z0000 Encounter for general adult medical examination without abnormal findings: Secondary | ICD-10-CM

## 2023-10-22 DIAGNOSIS — E785 Hyperlipidemia, unspecified: Secondary | ICD-10-CM | POA: Diagnosis not present

## 2023-10-22 DIAGNOSIS — E1169 Type 2 diabetes mellitus with other specified complication: Secondary | ICD-10-CM

## 2023-10-22 DIAGNOSIS — L309 Dermatitis, unspecified: Secondary | ICD-10-CM | POA: Diagnosis not present

## 2023-10-22 DIAGNOSIS — G4733 Obstructive sleep apnea (adult) (pediatric): Secondary | ICD-10-CM

## 2023-10-22 MED ORDER — ROSUVASTATIN CALCIUM 20 MG PO TABS: 20.0000 mg | ORAL_TABLET | Freq: Every day | ORAL | 3 refills | Status: AC

## 2023-10-22 MED ORDER — HYDROCORTISONE 1 % EX CREA: TOPICAL_CREAM | CUTANEOUS | 1 refills | Status: AC

## 2023-10-22 NOTE — Progress Notes (Unsigned)
Subjective:  CC: Routine follow up, Hand dermatitis   HPI:  [name] is a 53 y.o. person with a past medical history stated below and presents today for the stated chief complaint. Please see problem based assessment and plan for additional details.  Past Medical History:  Diagnosis Date   Acute respiratory failure with hypoxia (HCC) 06/18/2019   Allergy    SEASONAL   Anemia    Anxiety    Arthritis    "left knee"   Asthma    Back pain    Chronic lower back pain    "on the left side"   Diabetes (HCC)    Food allergy    Gallbladder problem    Headache(784.0) 02/29/2012   "frequent"   High cholesterol    Hyperkalemia 10/30/2018   Hypertension    Left knee pain 11/04/2015   Migraines    Neuromuscular disorder (HCC)    Siactic   Over weight    Pneumonia 2006   Post viral syndrome 07/17/2019   Renal insufficiency 06/18/2019   Sleep apnea    CPAP   SOB (shortness of breath)    Stress at home    "and at work"    Current Outpatient Medications on File Prior to Visit  Medication Sig Dispense Refill   albuterol (VENTOLIN HFA) 108 (90 Base) MCG/ACT inhaler Inhale 2 puffs into the lungs every 6 (six) hours as needed for wheezing or shortness of breath. 8 g 0   benzonatate (TESSALON PERLES) 100 MG capsule Take 1 capsule (100 mg total) by mouth every 6 (six) hours as needed for cough. 30 capsule 0   cetirizine (ZYRTEC ALLERGY) 10 MG tablet Take 1 tablet (10 mg total) by mouth daily. 30 tablet 2   Cholecalciferol 20 MCG (800 UNIT) TABS Take 20 mcg by mouth daily. 90 tablet 3   gabapentin (NEURONTIN) 300 MG capsule Take 1 capsule (300 mg total) by mouth at bedtime. 90 capsule 2   Semaglutide, 1 MG/DOSE, 4 MG/3ML SOPN Inject 1 mg as directed once a week. 3 mL 0   Vitamin D, Ergocalciferol, (DRISDOL) 1.25 MG (50000 UNIT) CAPS capsule Take 1 capsule (50,000 Units total) by mouth every 7 (seven) days. 4 capsule 0   No current facility-administered medications on file  prior to visit.    Review of Systems: Please see assessment and plan for pertinent positives and negatives.  Objective:   Vitals:   10/22/23 0900  BP: 122/68  Pulse: 79  Temp: 98 F (36.7 C)  TempSrc: Oral  SpO2: 98%  Weight: 258 lb 4.8 oz (117.2 kg)  Height: 5\' 2"  (1.575 m)    Physical Exam: Constitutional: Well-appearing Cardiovascular: Regular rate and rhythm Pulmonary/Chest: lungs clear to auscultation bilaterally Abdominal: soft, non-tender, non-distended Extremities: No edema of the lower extremities bilaterally.  Posterior tibial and dorsalis pedis pulses intact bilaterally. Psych: Pleasant affect Skin:  Some hypopigmentation and callus overlying the knuckles.  No rash or tunneling lesions.  No involvement of the webspaces. Photos placed in the chart. Thought process is linear and is goal-directed.     Assessment & Plan:  Health care maintenance Patient overdue for Pap smear, previous providers have made note that they would like to pursue Pap at this visit.  In speaking with patient, she states she was unaware that she was overdue for Pap smear and is not ready to get her Pap done today.  She is also overdue for mammogram. Plan: Discussed importance of getting Pap  done, patient to follow-up in 1 month for Pap. Mammogram orders placed   Hand dermatitis Patient presenting with some dry itchy skin overlying the knuckles.  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  She states that this issue began around November when the weather began getting colder.  Some hypopigmentation and callus overlying the knuckles.  No rash or tunneling lesions.  No involvement of the webspaces. Photos placed in the chart. Plan: Hydrocortisone cream Discussed continued emollient use    Hyperlipidemia associated with type 2 diabetes mellitus (HCC) Patient last LDL 90, goal of 70 in this patient with type 2 diabetes.  She  is on Crestor 20 mg, but states she has not been taking it completely consistently.  Discussed patient portance of taking her Crestor.  Last A1C 6.1%.  Follows with a healthy weight and wellness clinic, has lost some weight since last year, but weight relatively stable over the past 6 months.  Denies any abdominal symptoms from her Ozempic 1 mg weekly.  Intolerant of metformin in the past.  Discussed with her recommendations for glucose control and weight loss, patient states she does drink a lot of sodas.  Not interested in giving up sodas at this time.  Plan: Refilled Crestor for 90 days intervals to help with adherence. Diabetic foot exam today A1c next visit  Recommendations for weight loss and blood glucose control.   1) Cut out liquid sources of calories such as soda, juices, sports drinks, alcohol, sweet teas, and coffee with added sugars. These are "empty calories" meaning they bring your blood sugar up and add weight to your body without making you feel full.   2) Avoid food with added sugars. Some examples would be cakes, candies, and sauces (ie. ketchup and BBQ). You may be surprised how much sugar they sneak into processed foods.   3) Replace foods high in starches such as pasta, rice, potatoes, or breads with foods higher in protein such as meat.  Protein rich foods are more satiating - meaning they make you feel more full than carbohydrates. They also provide the body with important nutrients that we need. You can read nutritional labels to get more information regarding how much protein, carbohydrates, and fats foods contain.  OSA (obstructive sleep apnea) Followed up with sleep medicine recently, and is now on CPAP.  She states that this has been very helpful for her and her headaches have resolved since using the CPAP.  She feels better with this machine.   Patient discussed with Dr. Willow Ora MD [facility]  [id number] Pager: [phone]   Phone: [phone] Date 10/22/2023  Time 9:59 AM

## 2023-10-22 NOTE — Assessment & Plan Note (Signed)
Followed up with sleep medicine recently, and is now on CPAP.  She states that this has been very helpful for her and her headaches have resolved since using the CPAP.  She feels better with this machine.

## 2023-10-22 NOTE — Patient Instructions (Signed)
Thank you, Ms.Keara E Bilotti for allowing [place] to provide your care today.  Start the following medications: Meds ordered this encounter  Medications   hydrocortisone cream 1 %    Sig: Apply to affected area 2 times daily    Dispense:  30 g    Refill:  1   rosuvastatin (CRESTOR) 20 MG tablet    Sig: Take 1 tablet (20 mg total) by mouth daily.    Dispense:  90 tablet    Refill:  3     Recommendations for weight loss and blood glucose control.   1) Cut out liquid sources of calories such as soda, juices, sports drinks, alcohol, sweet teas, and coffee with added sugars. These are "empty calories" meaning they bring your blood sugar up and add weight to your body without making you feel full.   2) Avoid food with added sugars. Some examples would be cakes, candies, and sauces (ie. ketchup and BBQ). You may be surprised how much sugar they sneak into processed foods.   3) Replace foods high in starches such as pasta, rice, potatoes, or breads with foods higher in protein such as meat.  Protein rich foods are more satiating - meaning they make you feel more full than carbohydrates. They also provide the body with important nutrients that we need. You can read nutritional labels to get more information regarding how much protein, carbohydrates, and fats foods contain.   Follow up:  1 month  for Pap Smear    We look forward to seeing you next time. Please call our clinic at [phone] if you have any questions or concerns. The best time to call is Monday-Friday from 9am-4pm, but there is someone available 24/7. If after hours or the weekend, call the main hospital number and ask for the Internal Medicine Resident On-Call. If you need medication refills, please notify your pharmacy one week in advance and they will send [place] a request.   Thank you for trusting me with your care. Wishing you the best!  Lovie Macadamia MD [facility]

## 2023-10-22 NOTE — Assessment & Plan Note (Signed)
Patient presenting with some dry itchy skin overlying the knuckles.  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  She states that this issue began around November when the weather began getting colder.  Some hypopigmentation and callus overlying the knuckles.  No rash or tunneling lesions.  No involvement of the webspaces. Photos placed in the chart. Plan: Hydrocortisone cream Discussed continued emollient use

## 2023-10-22 NOTE — Assessment & Plan Note (Signed)
Patient overdue for Pap smear, previous providers have made note that they would like to pursue Pap at this visit.  In speaking with patient, she states she was unaware that she was overdue for Pap smear and is not ready to get her Pap done today.  She is also overdue for mammogram. Plan: Discussed importance of getting Pap done, patient to follow-up in 1 month for Pap. Mammogram orders placed

## 2023-10-22 NOTE — Assessment & Plan Note (Addendum)
Patient last LDL 90, goal of 70 in this patient with type 2 diabetes.  She is on Crestor 20 mg, but states she has not been taking it completely consistently.  Discussed patient portance of taking her Crestor.  Last A1C 6.1%.  Follows with a healthy weight and wellness clinic, has lost some weight since last year, but weight relatively stable over the past 6 months.  Denies any abdominal symptoms from her Ozempic 1 mg weekly.  Intolerant of metformin in the past.  Discussed with her recommendations for glucose control and weight loss, patient states she does drink a lot of sodas.  Not interested in giving up sodas at this time.  Plan: Refilled Crestor for 90 days intervals to help with adherence. Diabetic foot exam today A1c next visit  Recommendations for weight loss and blood glucose control.   1) Cut out liquid sources of calories such as soda, juices, sports drinks, alcohol, sweet teas, and coffee with added sugars. These are "empty calories" meaning they bring your blood sugar up and add weight to your body without making you feel full.   2) Avoid food with added sugars. Some examples would be cakes, candies, and sauces (ie. ketchup and BBQ). You may be surprised how much sugar they sneak into processed foods.   3) Replace foods high in starches such as pasta, rice, potatoes, or breads with foods higher in protein such as meat.  Protein rich foods are more satiating - meaning they make you feel more full than carbohydrates. They also provide the body with important nutrients that we need. You can read nutritional labels to get more information regarding how much protein, carbohydrates, and fats foods contain.

## 2023-10-25 NOTE — Progress Notes (Signed)
Internal Medicine Clinic Attending  Case discussed with the resident at the time of the visit.  We reviewed the resident's history and exam and pertinent patient test results.  I agree with the assessment, diagnosis, and plan of care documented in the resident's note.

## 2023-10-27 DIAGNOSIS — G4733 Obstructive sleep apnea (adult) (pediatric): Secondary | ICD-10-CM | POA: Diagnosis not present

## 2023-10-27 DIAGNOSIS — Z419 Encounter for procedure for purposes other than remedying health state, unspecified: Secondary | ICD-10-CM | POA: Diagnosis not present

## 2023-10-29 ENCOUNTER — Ambulatory Visit
Admission: RE | Admit: 2023-10-29 | Discharge: 2023-10-29 | Disposition: A | Discharge status: Home / Self Care | Payer: Medicaid Other | Source: Ambulatory Visit | Attending: Internal Medicine | Admitting: Internal Medicine

## 2023-10-29 DIAGNOSIS — Z0000 Encounter for general adult medical examination without abnormal findings: Secondary | ICD-10-CM

## 2023-10-29 DIAGNOSIS — Z1231 Encounter for screening mammogram for malignant neoplasm of breast: Secondary | ICD-10-CM | POA: Diagnosis not present

## 2023-11-01 ENCOUNTER — Telehealth: Payer: Self-pay

## 2023-11-01 ENCOUNTER — Ambulatory Visit: Payer: Medicaid Other | Admitting: Internal Medicine

## 2023-11-01 ENCOUNTER — Other Ambulatory Visit (HOSPITAL_COMMUNITY)
Admission: RE | Admit: 2023-11-01 | Discharge: 2023-11-01 | Disposition: A | Discharge status: Home / Self Care | Payer: Medicaid Other | Source: Ambulatory Visit | Attending: Internal Medicine | Admitting: Internal Medicine

## 2023-11-01 VITALS — BP 136/72 | HR 85 | Temp 98.2°F | Ht 62.0 in | Wt 257.5 lb

## 2023-11-01 DIAGNOSIS — Z124 Encounter for screening for malignant neoplasm of cervix: Secondary | ICD-10-CM

## 2023-11-01 DIAGNOSIS — L309 Dermatitis, unspecified: Secondary | ICD-10-CM | POA: Diagnosis not present

## 2023-11-01 DIAGNOSIS — E1165 Type 2 diabetes mellitus with hyperglycemia: Secondary | ICD-10-CM

## 2023-11-01 DIAGNOSIS — Z7985 Long-term (current) use of injectable non-insulin antidiabetic drugs: Secondary | ICD-10-CM

## 2023-11-01 DIAGNOSIS — E559 Vitamin D deficiency, unspecified: Secondary | ICD-10-CM

## 2023-11-01 DIAGNOSIS — Z6841 Body Mass Index (BMI) 40.0 and over, adult: Secondary | ICD-10-CM

## 2023-11-01 LAB — POCT GLYCOSYLATED HEMOGLOBIN (HGB A1C): Hemoglobin A1C: 5.7 % — AB (ref 4.0–5.6)

## 2023-11-01 LAB — GLUCOSE, CAPILLARY: Glucose-Capillary: 88 mg/dL (ref 70–99)

## 2023-11-01 MED ORDER — OZEMPIC (2 MG/DOSE) 8 MG/3ML ~~LOC~~ SOPN: 2.0000 mg | PEN_INJECTOR | SUBCUTANEOUS | 3 refills | Status: DC

## 2023-11-01 MED ORDER — CHOLECALCIFEROL 20 MCG (800 UNIT) PO TABS: 20.0000 ug | ORAL_TABLET | Freq: Every day | ORAL | 3 refills | Status: AC

## 2023-11-01 NOTE — Assessment & Plan Note (Signed)
 Last vitamin D  level 30.3.  She was given 4 doses of 50,000 units in January, last dose this week. P: Switch to vitamin D  800 units daily

## 2023-11-01 NOTE — Assessment & Plan Note (Signed)
 Diabetes well-controlled.  She is adherent with Ozempic  1 mg and is not having any side effects. Plan: Increase Ozempic  1 mg to 2 mg for weight loss goals

## 2023-11-01 NOTE — Assessment & Plan Note (Signed)
 Improved from last time.  She started using steroid cream a few days ago.  We also talked about trying Vaseline at night and placing gloves to help with her skin.

## 2023-11-01 NOTE — Telephone Encounter (Signed)
 Decision:Approved  Reika Murata (Key: [id number]) Rx #: [phone] [phone] Ozempic  (2 MG/DOSE) 8MG JONELLE pen-injectors Form [employer] of [place]  Electronic Prior Authorization Request Form (2017 NCPDP) Created Message from Plan Approved. This drug has been approved. Approved quantity: 3 units per 28 day(s). The drug has been approved from 10/18/2023 to 10/31/2024. Please call the pharmacy to process your prescription claim. Generic or biosimilar substitution may be required when available and preferred on the formulary.. Authorization Expiration Date: October 31, 2024.

## 2023-11-01 NOTE — Telephone Encounter (Signed)
 Prior Authorization for patient (Ozempic  (2 MG/DOSE) 8MG /3ML pen-injectors) came through on cover my meds was submitted with last office notes and labs awaiting approval or denial.  TKZ:[id number]

## 2023-11-01 NOTE — Progress Notes (Signed)
 Subjective:  CC: cervical cancer screening  HPI:  [name] is a 53 y.o. female with a past medical history of IDA, HTN, diabetes, OSA, and HLD who presents today for cervical cancer screening.   She was last seen in clinic January 27 for hand dermatitis and chronic care management. She reports that her hands are doing better after starting steroid cream.  Please see problem based assessment and plan for additional details.  Past Medical History:  Diagnosis Date   Acute respiratory failure with hypoxia (HCC) 06/18/2019   Allergy    SEASONAL   Anemia    Anxiety    Arthritis    left knee   Asthma    Back pain    Chronic lower back pain    on the left side   Diabetes (HCC)    Food allergy    Gallbladder problem    Headache(784.0) 02/29/2012   frequent   High cholesterol    Hyperkalemia 10/30/2018   Hypertension    Left knee pain 11/04/2015   Migraines    Neuromuscular disorder (HCC)    Siactic   Over weight    Pneumonia 2006   Post viral syndrome 07/17/2019   Renal insufficiency 06/18/2019   Sleep apnea    CPAP   SOB (shortness of breath)    Stress at home    and at work    MEDICATIONS:  Semaglutide  1 mg weekly Cetirizine  10 mg daily Albuterol  inhaler as needed Rosuvastatin  20 mg daily Gabapentin  300 mg nightly Vitamin D , 50000  Family History  Problem Relation Age of Onset   High blood pressure Mother    Thyroid  disease Mother    Sleep apnea Mother    Obesity Mother    Cervical cancer Maternal Grandmother    Cancer Other    Breast cancer Neg Hx    Colon cancer Neg Hx    Colon polyps Neg Hx    Crohn's disease Neg Hx    Esophageal cancer Neg Hx    Rectal cancer Neg Hx    Stomach cancer Neg Hx     Past Surgical History:  Procedure Laterality Date   BREAST BIOPSY Left 07/29/2013   CESAREAN SECTION  08/2008   CHOLECYSTECTOMY  03/02/2012   Procedure: LAPAROSCOPIC CHOLECYSTECTOMY;  Surgeon: Lynwood MALVA Pina, MD;  Location: [facility] OR;   Service: General;;   KNEE ARTHROSCOPY WITH MENISCAL REPAIR Right 06/25/2018   Procedure: RIGHT KNEE ARTHROSCOPY WITH MENISCAL ROOT REPAIR;  Surgeon: Addie Cordella Hamilton, MD;  Location: [facility] OR;  Service: Orthopedics;  Laterality: Right;   KNEE ARTHROSCOPY WITH MENISCAL REPAIR Left 03/09/2020   Procedure: LEFT KNEE ARTHROSCOPY, MEDIAL MENISCAL ROOT REPAIR;  Surgeon: Addie Cordella Hamilton, MD;  Location: [facility] OR;  Service: Orthopedics;  Laterality: Left;   WISDOM TOOTH EXTRACTION       Social History   Socioeconomic History   Marital status: Single    Spouse name: Not on file   Number of children: Not on file   Years of education: Not on file   Highest education level: Not on file  Occupational History   Not on file  Tobacco Use   Smoking status: Never    Passive exposure: Current   Smokeless tobacco: Never  Vaping Use   Vaping status: Never Used  Substance and Sexual Activity   Alcohol use: No    Alcohol/week: 0.0 standard drinks of alcohol   Drug use: No   Sexual activity: Not on file  Other  Topics Concern   Not on file  Social History Narrative   Caffiene, 1 cup coffee in am, soda's - 1-2 can daily.   Education :  11th grade.   Working; Surveyor, mining.    Social Drivers of Corporate Investment Banker Strain: Low Risk  (10/22/2023)   Overall Financial Resource Strain (CARDIA)    Difficulty of Paying Living Expenses: Not very hard  Food Insecurity: No Food Insecurity (10/22/2023)   Hunger Vital Sign    Worried About Running Out of Food in the Last Year: Never true    Ran Out of Food in the Last Year: Never true  Transportation Needs: No Transportation Needs (10/22/2023)   PRAPARE - Administrator, Civil Service (Medical): No    Lack of Transportation (Non-Medical): No  Physical Activity: Insufficiently Active (10/22/2023)   Exercise Vital Sign    Days of Exercise per Week: 2 days    Minutes of Exercise per Session: 20 min  Stress: No Stress Concern Present  (10/22/2023)   [employer] of Occupational Health - Occupational Stress Questionnaire    Feeling of Stress : Only a little  Social Connections: Moderately Isolated (10/22/2023)   Social Connection and Isolation Panel [NHANES]    Frequency of Communication with Friends and Family: More than three times a week    Frequency of Social Gatherings with Friends and Family: More than three times a week    Attends Religious Services: Never    Database Administrator or Organizations: No    Attends Banker Meetings: Never    Marital Status: Living with partner  Intimate Partner Violence: Not At Risk (10/22/2023)   Humiliation, Afraid, Rape, and Kick questionnaire    Fear of Current or Ex-Partner: No    Emotionally Abused: No    Physically Abused: No    Sexually Abused: No    Review of Systems: ROS negative except for what is noted on the assessment and plan.  Objective:   Vitals:   11/01/23 0907  BP: 136/72  Pulse: 85  Temp: 98.2 F (36.8 C)  TempSrc: Oral  SpO2: 94%  Weight: 257 lb 8 oz (116.8 kg)  Height: 5' 2 (1.575 m)    Physical Exam: Constitutional: well-appearing, in no acute distress Cardiovascular: regular rate and rhythm, no m/r/g Pulmonary/Chest: normal work of breathing on room air, lungs clear to auscultation bilaterally GU: normal appearing external gentilia, normal appearing vaginal mucosa and cervix.  no discharge or blood noted in vaginal vault.  Pap smear obtained.  Female nursing staff present during patient's pelvic examination   Assessment & Plan:  BMI 45.0-49.9, adult [facility]) She follows with healthy weight management clinic.  She is a bit frustrated as her weight has been stable with diet and exercise.  She remains on Ozempic  1 mg.  P: increase Ozempic  1 mg to 2 mg  Hand dermatitis Improved from last time.  She started using steroid cream a few days ago.  We also talked about trying Vaseline at night and placing gloves to help with her  skin.  Type 2 diabetes mellitus with hyperglycemia, without long-term current use of insulin  (HCC) Diabetes well-controlled.  She is adherent with Ozempic  1 mg and is not having any side effects. Plan: Increase Ozempic  1 mg to 2 mg for weight loss goals  Vitamin D  deficiency Last vitamin D  level 30.3.  She was given 4 doses of 50,000 units in January, last dose this week. P: Switch to  vitamin D  800 units daily   Patient discussed with Dr. Lovie Pagan Irvan Tiedt, D.O. [facility]  PGY-3 Pager: [phone]  Phone: [phone] Date 11/01/2023  Time 9:43 AM

## 2023-11-01 NOTE — Patient Instructions (Addendum)
 Thank you, [name] for allowing us  to provide your care today.   Diabetes Your diabetes is under good control. We can increase dose of ozempic  to 2 mg to help with weight loss goals.  Vitamin D  Please complete last dose of high dose supplementation then switch to daily 800 mg of vitamin D .  Cervical cancer screening I will call you with the results from this.  Work note provided   I have ordered the following labs for you:  Lab Orders         POCT glycosylated hemoglobin (Hb A1C)       I have ordered the following medication/changed the following medications:   Stop the following medications: Medications Discontinued During This Encounter  Medication Reason   Cholecalciferol  20 MCG (800 UNIT) TABS Reorder   Vitamin D , Ergocalciferol , (DRISDOL ) 1.25 MG (50000 UNIT) CAPS capsule      Start the following medications: Meds ordered this encounter  Medications   Cholecalciferol  20 MCG (800 UNIT) TABS    Sig: Take 20 mcg by mouth daily.    Dispense:  90 tablet    Refill:  3     Follow up: 3 months   We look forward to seeing you next time. Please call our clinic at [phone] if you have any questions or concerns. The best time to call is Monday-Friday from 9am-4pm, but there is someone available 24/7. If after hours or the weekend, call the main hospital number and ask for the Internal Medicine Resident On-Call. If you need medication refills, please notify your pharmacy one week in advance and they will send us  a request.   Thank you for trusting me with your care. Wishing you the best!   [name] Medley, DO [facility]

## 2023-11-01 NOTE — Assessment & Plan Note (Addendum)
 She follows with healthy weight management clinic.  She is a bit frustrated as her weight has been stable with diet and exercise.  She remains on Ozempic  1 mg.  P: increase Ozempic  1 mg to 2 mg

## 2023-11-05 NOTE — Progress Notes (Signed)
 Internal Medicine Clinic Attending  Case discussed with the resident at the time of the visit.  We reviewed the resident's history and exam and pertinent patient test results.  I agree with the assessment, diagnosis, and plan of care documented in the resident's note.

## 2023-11-06 LAB — CYTOLOGY - PAP
Comment: NEGATIVE
Diagnosis: NEGATIVE
High risk HPV: NEGATIVE

## 2023-11-08 ENCOUNTER — Ambulatory Visit (INDEPENDENT_AMBULATORY_CARE_PROVIDER_SITE_OTHER): Payer: Medicaid Other | Admitting: Family Medicine

## 2023-11-20 ENCOUNTER — Telehealth: Payer: Self-pay | Admitting: Neurology

## 2023-11-20 NOTE — Telephone Encounter (Signed)
 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

## 2023-11-21 ENCOUNTER — Telehealth: Payer: Medicaid Other | Admitting: Neurology

## 2023-11-24 DIAGNOSIS — G4733 Obstructive sleep apnea (adult) (pediatric): Secondary | ICD-10-CM | POA: Diagnosis not present

## 2023-11-24 DIAGNOSIS — Z419 Encounter for procedure for purposes other than remedying health state, unspecified: Secondary | ICD-10-CM | POA: Diagnosis not present

## 2023-12-06 ENCOUNTER — Encounter (INDEPENDENT_AMBULATORY_CARE_PROVIDER_SITE_OTHER): Payer: Self-pay | Admitting: Family Medicine

## 2023-12-06 ENCOUNTER — Ambulatory Visit (INDEPENDENT_AMBULATORY_CARE_PROVIDER_SITE_OTHER): Payer: Medicaid Other | Admitting: Family Medicine

## 2023-12-06 VITALS — BP 137/77 | HR 82 | Temp 98.1°F | Ht 62.0 in | Wt 254.0 lb

## 2023-12-06 DIAGNOSIS — Z7985 Long-term (current) use of injectable non-insulin antidiabetic drugs: Secondary | ICD-10-CM | POA: Diagnosis not present

## 2023-12-06 DIAGNOSIS — G4733 Obstructive sleep apnea (adult) (pediatric): Secondary | ICD-10-CM | POA: Diagnosis not present

## 2023-12-06 DIAGNOSIS — E559 Vitamin D deficiency, unspecified: Secondary | ICD-10-CM

## 2023-12-06 DIAGNOSIS — Z6841 Body Mass Index (BMI) 40.0 and over, adult: Secondary | ICD-10-CM | POA: Diagnosis not present

## 2023-12-06 DIAGNOSIS — E538 Deficiency of other specified B group vitamins: Secondary | ICD-10-CM | POA: Diagnosis not present

## 2023-12-06 DIAGNOSIS — E119 Type 2 diabetes mellitus without complications: Secondary | ICD-10-CM

## 2023-12-06 DIAGNOSIS — E669 Obesity, unspecified: Secondary | ICD-10-CM | POA: Diagnosis not present

## 2023-12-06 DIAGNOSIS — E1169 Type 2 diabetes mellitus with other specified complication: Secondary | ICD-10-CM | POA: Diagnosis not present

## 2023-12-06 DIAGNOSIS — E66813 Obesity, class 3: Secondary | ICD-10-CM

## 2023-12-06 NOTE — Progress Notes (Signed)
 Office: [phone]  /  Fax: [phone]  WEIGHT SUMMARY AND BIOMETRICS  Anthropometric Measurements Height: 5\' 2"  (1.575 m) Weight: 254 lb (115.2 kg) BMI (Calculated): 46.45 Weight at Last Visit: 251 lb Weight Lost Since Last Visit: 0 lb Weight Gained Since Last Visit: 3 lb Starting Weight: 267 lb Total Weight Loss (lbs): 13 lb (5.897 kg)   Body Composition  Body Fat %: 49.1 % Fat Mass (lbs): 124.8 lbs Muscle Mass (lbs): 122.6 lbs Total Body Water (lbs): 82 lbs Visceral Fat Rating : 17   Other Clinical Data Fasting: Yes Labs: No Today's Visit #: 20 Starting Date: 03/21/22    Chief Complaint: OBESITY   History of Present Illness   The patient presents for obesity treatment plan assessment and progress evaluation.  She is adhering to the category three obesity treatment plan 95% of the time and engages in walking for 30 minutes two to three times per week. Despite these efforts, she has gained three pounds in the last two months. She feels a little tired lately, attributing it to being busy.  She is currently taking 2 mg of Ozempic for her type 2 diabetes, which she manages with diet, exercise, and medication. Her blood sugars are well-controlled. She experienced constipation only on the first day of the increased dose, with no further issues. No current thyroid issues are reported.  She uses a CPAP machine for her obstructive sleep apnea and reports improved sleep, getting about eight hours per night. Initially, she had difficulty with the nasal mask due to sleeping with her mouth open, so she switched to a full face mask, which she finds aggravating as it moves when she does.  She is not currently taking vitamin D supplements as previously advised, but she has not yet purchased over-the-counter options. Her B12 levels had been low, but she has been on a B12-rich diet.          PHYSICAL EXAM:  Blood pressure 137/77, pulse 82, temperature 98.1 F (36.7 C),  height 5\' 2"  (1.575 m), weight 254 lb (115.2 kg), SpO2 99%. Body mass index is 46.46 kg/m.  DIAGNOSTIC DATA REVIEWED:  BMET    Component Value Date/Time   NA 143 08/02/2023 1055   K 4.3 08/02/2023 1055   CL 105 08/02/2023 1055   CO2 22 08/02/2023 1055   GLUCOSE 88 08/02/2023 1055   GLUCOSE 126 (H) 03/05/2020 0924   BUN 11 08/02/2023 1055   CREATININE 0.83 08/02/2023 1055   CALCIUM 9.4 08/02/2023 1055   GFRNONAA >60 03/05/2020 0924   GFRAA >60 03/05/2020 0924   Lab Results  Component Value Date   HGBA1C 5.7 (A) 11/01/2023   HGBA1C 5.8 09/07/2014   Lab Results  Component Value Date   INSULIN 16.7 08/02/2023   INSULIN 20.0 03/20/2022   Lab Results  Component Value Date   TSH 1.260 03/20/2023   CBC    Component Value Date/Time   WBC 6.9 03/20/2023 1153   WBC 6.9 03/05/2020 0924   RBC 4.94 03/20/2023 1153   RBC 4.89 03/05/2020 0924   HGB 13.1 03/20/2023 1153   HCT 41.4 03/20/2023 1153   PLT 394 03/20/2023 1153   MCV 84 03/20/2023 1153   MCH 26.5 (L) 03/20/2023 1153   MCH 25.8 (L) 03/05/2020 0924   MCHC 31.6 03/20/2023 1153   MCHC 30.6 03/05/2020 0924   RDW 14.4 03/20/2023 1153   Iron Studies    Component Value Date/Time   IRON 36 06/27/2016 1457   TIBC  294 06/27/2016 1457   FERRITIN 140 12/09/2020 1421   IRONPCTSAT 12 (L) 06/27/2016 1457   Lipid Panel     Component Value Date/Time   CHOL 172 08/02/2023 1055   TRIG 60 08/02/2023 1055   HDL 65 08/02/2023 1055   CHOLHDL 2.9 08/03/2021 1031   LDLCALC 95 08/02/2023 1055   Hepatic Function Panel     Component Value Date/Time   PROT 7.6 08/02/2023 1055   ALBUMIN 4.3 08/02/2023 1055   AST 19 08/02/2023 1055   ALT 10 08/02/2023 1055   ALKPHOS 84 08/02/2023 1055   BILITOT 0.4 08/02/2023 1055      Component Value Date/Time   TSH 1.260 03/20/2023 1153   Nutritional Lab Results  Component Value Date   VD25OH 30.3 08/02/2023   VD25OH 49.6 03/20/2023   VD25OH 39.9 09/13/2022     Assessment and  Plan    Obesity She adheres to the category three plan 95% of the time and engages in 30 minutes of walking 2-3 times per week. Despite these efforts, she has gained 3 pounds in the last two months. Emphasized the importance of protein intake to prevent metabolic decline and advised prioritizing 30 grams of protein per meal. Encouraged continuation of her exercise routine and incorporation of strength training activities, such as walking uphill or using stairs, to maintain muscle mass. - Continue category three plan - Increase protein intake to 30 grams per meal - Incorporate strength training exercises, such as walking uphill or using stairs  Type 2 Diabetes Mellitus Her diabetes is managed with 2 mg of Ozempic, diet, and exercise. Blood glucose levels are well-controlled. Discussed potential issues with Ozempic, such as satiety leading to inadequate nutrition and decreased metabolism. Emphasized ensuring adequate protein intake to maintain metabolism and prevent weight gain despite reduced appetite. - Continue 2 mg Ozempic - Monitor blood glucose levels - Ensure adequate protein intake to maintain metabolism  Obstructive Sleep Apnea She uses a full-face CPAP mask and reports improved sleep quality, achieving about 8 hours per night. Improved sleep can aid in maintaining metabolism and overall health. Discussed potential discontinuation of CPAP if weight loss goals are achieved. - Continue using CPAP  Vitamin D Deficiency She discontinued vitamin D supplements. Plans to check vitamin D levels to determine if supplementation is needed. If levels are low, will advise on appropriate over-the-counter supplementation or prescription if necessary. - Check vitamin D levels - Advise on vitamin D supplementation based on lab results  Vitamin B12 Deficiency She follows a B12-rich diet. Plans to check B12 levels to assess if the deficiency has resolved. - Check vitamin B12 levels  Follow-up She  will have her labs checked today as she is fasting. Plans to review blood glucose, A1c, insulin, B12, and vitamin D levels. Follow-up in 4-6 weeks. - Order labs for blood glucose, A1c, insulin, B12, and vitamin D - Schedule follow-up appointment in 4-6 weeks        She was informed of the importance of frequent follow up visits to maximize her success with intensive lifestyle modifications for her multiple health conditions.    Quillian Quince, MD

## 2023-12-07 LAB — VITAMIN B12: Vitamin B-12: 303 pg/mL (ref 232–1245)

## 2023-12-07 LAB — CMP14+EGFR
ALT: 13 IU/L (ref 0–32)
AST: 16 IU/L (ref 0–40)
Albumin: 3.7 g/dL — ABNORMAL LOW (ref 3.8–4.9)
Alkaline Phosphatase: 75 IU/L (ref 44–121)
BUN/Creatinine Ratio: 13 (ref 9–23)
BUN: 9 mg/dL (ref 6–24)
Bilirubin Total: 0.3 mg/dL (ref 0.0–1.2)
CO2: 21 mmol/L (ref 20–29)
Calcium: 9.1 mg/dL (ref 8.7–10.2)
Chloride: 108 mmol/L — ABNORMAL HIGH (ref 96–106)
Creatinine, Ser: 0.71 mg/dL (ref 0.57–1.00)
Globulin, Total: 2.8 g/dL (ref 1.5–4.5)
Glucose: 93 mg/dL (ref 70–99)
Potassium: 4.6 mmol/L (ref 3.5–5.2)
Sodium: 143 mmol/L (ref 134–144)
Total Protein: 6.5 g/dL (ref 6.0–8.5)
eGFR: 103 mL/min/{1.73_m2} (ref >59)

## 2023-12-07 LAB — LIPID PANEL WITH LDL/HDL RATIO
Cholesterol, Total: 125 mg/dL (ref 100–199)
HDL: 59 mg/dL (ref >39)
LDL Chol Calc (NIH): 55 mg/dL (ref 0–99)
LDL/HDL Ratio: 0.9 ratio (ref 0.0–3.2)
Triglycerides: 48 mg/dL (ref 0–149)
VLDL Cholesterol Cal: 11 mg/dL (ref 5–40)

## 2023-12-07 LAB — HEMOGLOBIN A1C
Est. average glucose Bld gHb Est-mCnc: 117 mg/dL
Hgb A1c MFr Bld: 5.7 % — ABNORMAL HIGH (ref 4.8–5.6)

## 2023-12-07 LAB — INSULIN, RANDOM: INSULIN: 17.5 u[IU]/mL (ref 2.6–24.9)

## 2023-12-07 LAB — VITAMIN D 25 HYDROXY (VIT D DEFICIENCY, FRACTURES): Vit D, 25-Hydroxy: 24.1 ng/mL — ABNORMAL LOW (ref 30.0–100.0)

## 2023-12-17 DIAGNOSIS — H40023 Open angle with borderline findings, high risk, bilateral: Secondary | ICD-10-CM | POA: Diagnosis not present

## 2023-12-25 DIAGNOSIS — G4733 Obstructive sleep apnea (adult) (pediatric): Secondary | ICD-10-CM | POA: Diagnosis not present

## 2024-01-03 DIAGNOSIS — R262 Difficulty in walking, not elsewhere classified: Secondary | ICD-10-CM | POA: Diagnosis not present

## 2024-01-03 DIAGNOSIS — M25561 Pain in right knee: Secondary | ICD-10-CM | POA: Diagnosis not present

## 2024-01-03 DIAGNOSIS — M25562 Pain in left knee: Secondary | ICD-10-CM | POA: Diagnosis not present

## 2024-01-03 DIAGNOSIS — M170 Bilateral primary osteoarthritis of knee: Secondary | ICD-10-CM | POA: Diagnosis not present

## 2024-01-05 DIAGNOSIS — Z419 Encounter for procedure for purposes other than remedying health state, unspecified: Secondary | ICD-10-CM | POA: Diagnosis not present

## 2024-01-13 ENCOUNTER — Encounter (INDEPENDENT_AMBULATORY_CARE_PROVIDER_SITE_OTHER): Payer: Self-pay | Admitting: *Deleted

## 2024-01-14 ENCOUNTER — Ambulatory Visit (INDEPENDENT_AMBULATORY_CARE_PROVIDER_SITE_OTHER): Admitting: Family Medicine

## 2024-01-17 DIAGNOSIS — G4733 Obstructive sleep apnea (adult) (pediatric): Secondary | ICD-10-CM | POA: Diagnosis not present

## 2024-01-24 ENCOUNTER — Other Ambulatory Visit (INDEPENDENT_AMBULATORY_CARE_PROVIDER_SITE_OTHER): Payer: Self-pay | Admitting: Family Medicine

## 2024-01-24 DIAGNOSIS — R0602 Shortness of breath: Secondary | ICD-10-CM

## 2024-01-25 ENCOUNTER — Encounter: Payer: Self-pay | Admitting: Student

## 2024-01-25 ENCOUNTER — Ambulatory Visit (INDEPENDENT_AMBULATORY_CARE_PROVIDER_SITE_OTHER): Payer: Medicaid Other | Admitting: Student

## 2024-01-25 VITALS — BP 132/81 | HR 69 | Temp 97.8°F | Ht 62.0 in | Wt 257.4 lb

## 2024-01-25 DIAGNOSIS — E28319 Asymptomatic premature menopause: Secondary | ICD-10-CM | POA: Insufficient documentation

## 2024-01-25 DIAGNOSIS — E1165 Type 2 diabetes mellitus with hyperglycemia: Secondary | ICD-10-CM

## 2024-01-25 DIAGNOSIS — G4733 Obstructive sleep apnea (adult) (pediatric): Secondary | ICD-10-CM

## 2024-01-25 DIAGNOSIS — Z7985 Long-term (current) use of injectable non-insulin antidiabetic drugs: Secondary | ICD-10-CM | POA: Diagnosis not present

## 2024-01-25 DIAGNOSIS — Z7409 Other reduced mobility: Secondary | ICD-10-CM | POA: Insufficient documentation

## 2024-01-25 DIAGNOSIS — I10 Essential (primary) hypertension: Secondary | ICD-10-CM | POA: Diagnosis not present

## 2024-01-25 NOTE — Assessment & Plan Note (Signed)
 Well controlled with Ozempic  2 mg weekly without side effects. She is also on Crestor . Uptodate with foot exam and eye screenings.  - Continue Ozempic  2 mg weekly

## 2024-01-25 NOTE — Assessment & Plan Note (Signed)
 Outpatient visit blood pressure measurements: 136/72 -> 137/77 ->132/81. Discussed lifestyle modifications at length, which patient would like to try before trial of antihypertensive. She will obtain blood pressure cuff and monitor at home. She is seen at healthy weight office monthly; advised to calibrate her home machine to their office to notice discrepancies.  - Continue to monitor at outpatient visits and start pharmacotherapy if necessary

## 2024-01-25 NOTE — Assessment & Plan Note (Signed)
 Patient with antalgic gait from chronic bilateral knee osteoarthritis for which she receives periodic knee injections. 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 However, she will continue to benefit from parking placard.

## 2024-01-25 NOTE — Assessment & Plan Note (Signed)
 Patient initially with a Nexplanon in 2009, removed in early 2014. Since patient has not had a menstrual period. Reviewed the record and TSH has been within normal levels, otherwise, no history of galactosemia, autoimmune diseases, or toxic medications. Patient  Denies vasomotor symptoms. Takes vitamin D  for vitamin D  deficiency. No DEXA on file.  Patient is now 52yo and given long history of amenorrhea, now menopause, patient will need evaluation for osteopenia/osteoporosis. - Follow up dexa scan

## 2024-01-25 NOTE — Progress Notes (Signed)
 Subjective:  CC: Diabetes follow up  HPI:  [name] is a 53 y.o. female with a past medical history stated below and presents today for diabetes follow up. Please see problem based assessment and plan for additional details.  Past Medical History:  Diagnosis Date   Acute respiratory failure with hypoxia (HCC) 06/18/2019   Allergy    SEASONAL   Anemia    Anxiety    Arthritis    "left knee"   Asthma    Back pain    Chronic lower back pain    "on the left side"   Diabetes (HCC)    Food allergy    Gallbladder problem    Headache(784.0) 02/29/2012   "frequent"   High cholesterol    Hyperkalemia 10/30/2018   Hypertension    Left knee pain 11/04/2015   Migraines    Neuromuscular disorder (HCC)    Siactic   Over weight    Pneumonia 2006   Post viral syndrome 07/17/2019   Renal insufficiency 06/18/2019   Sleep apnea    CPAP   SOB (shortness of breath)    Stress at home    "and at work"    Current Outpatient Medications on File Prior to Visit  Medication Sig Dispense Refill   albuterol  (VENTOLIN  HFA) 108 (90 Base) MCG/ACT inhaler Inhale 2 puffs into the lungs every 6 (six) hours as needed for wheezing or shortness of breath. 8 g 0   benzonatate  (TESSALON  PERLES) 100 MG capsule Take 1 capsule (100 mg total) by mouth every 6 (six) hours as needed for cough. 30 capsule 0   cetirizine  (ZYRTEC  ALLERGY) 10 MG tablet Take 1 tablet (10 mg total) by mouth daily. 30 tablet 2   Cholecalciferol  20 MCG (800 UNIT) TABS Take 20 mcg by mouth daily. 90 tablet 3   gabapentin  (NEURONTIN ) 300 MG capsule Take 1 capsule (300 mg total) by mouth at bedtime. 90 capsule 2   rosuvastatin  (CRESTOR ) 20 MG tablet Take 1 tablet (20 mg total) by mouth daily. 90 tablet 3   Semaglutide , 2 MG/DOSE, (OZEMPIC , 2 MG/DOSE,) 8 MG/3ML SOPN Inject 2 mg into the skin once a week. 3 mL 3   No current facility-administered medications on file prior to visit.    Family History  Problem Relation Age  of Onset   High blood pressure Mother    Thyroid  disease Mother    Sleep apnea Mother    Obesity Mother    Cervical cancer Maternal Grandmother    Cancer Other    Breast cancer Neg Hx    Colon cancer Neg Hx    Colon polyps Neg Hx    Crohn's disease Neg Hx    Esophageal cancer Neg Hx    Rectal cancer Neg Hx    Stomach cancer Neg Hx     Social History   Socioeconomic History   Marital status: Single    Spouse name: Not on file   Number of children: Not on file   Years of education: Not on file   Highest education level: Not on file  Occupational History   Not on file  Tobacco Use   Smoking status: Never    Passive exposure: Current   Smokeless tobacco: Never  Vaping Use   Vaping status: Never Used  Substance and Sexual Activity   Alcohol use: No    Alcohol/week: 0.0 standard drinks of alcohol   Drug use: No   Sexual activity: Not on file  Other  Topics Concern   Not on file  Social History Narrative   Caffiene, 1 cup coffee in am, soda's - 1-2 can daily.   Education :  11th grade.   Working; Surveyor, mining.    Social Drivers of Corporate investment banker Strain: Low Risk  (10/22/2023)   Overall Financial Resource Strain (CARDIA)    Difficulty of Paying Living Expenses: Not very hard  Food Insecurity: No Food Insecurity (10/22/2023)   Hunger Vital Sign    Worried About Running Out of Food in the Last Year: Never true    Ran Out of Food in the Last Year: Never true  Transportation Needs: No Transportation Needs (10/22/2023)   PRAPARE - Administrator, Civil Service (Medical): No    Lack of Transportation (Non-Medical): No  Physical Activity: Insufficiently Active (10/22/2023)   Exercise Vital Sign    Days of Exercise per Week: 2 days    Minutes of Exercise per Session: 20 min  Stress: No Stress Concern Present (10/22/2023)   [employer] of Occupational Health - Occupational Stress Questionnaire    Feeling of Stress : Only a little  Social  Connections: Moderately Isolated (10/22/2023)   Social Connection and Isolation Panel [NHANES]    Frequency of Communication with Friends and Family: More than three times a week    Frequency of Social Gatherings with Friends and Family: More than three times a week    Attends Religious Services: Never    Database administrator or Organizations: No    Attends Banker Meetings: Never    Marital Status: Living with partner  Intimate Partner Violence: Not At Risk (10/22/2023)   Humiliation, Afraid, Rape, and Kick questionnaire    Fear of Current or Ex-Partner: No    Emotionally Abused: No    Physically Abused: No    Sexually Abused: No    Review of Systems: ROS negative except for what is noted on the assessment and plan.  Objective:   Vitals:   01/25/24 0949  BP: 132/81  Pulse: 69  Temp: 97.8 F (36.6 C)  TempSrc: Oral  SpO2: 99%  Weight: 257 lb 6.4 oz (116.8 kg)  Height: 5\' 2"  (1.575 m)    Physical Exam: Constitutional: well-appearing woman sitting in chair, in no acute distress HENT: normocephalic atraumatic, mucous membranes moist Eyes: conjunctiva non-erythematous Neck: supple Cardiovascular: regular rate and rhythm, no m/r/g Pulmonary/Chest: normal work of breathing on room air, lungs clear to auscultation bilaterally Abdominal: soft, non-tender, non-distended MSK: normal bulk and tone Neurological: alert & oriented x 3, antalgic gait.  Skin: warm and dry Psych: Pleasant mood and affect       01/25/2024    9:51 AM  Depression screen PHQ 2/9  Decreased Interest 0  Down, Depressed, Hopeless 0  PHQ - 2 Score 0       02/01/2018   10:18 AM  GAD 7 : Generalized Anxiety Score  Nervous, Anxious, on Edge 1  Control/stop worrying 3  Worry too much - different things 3  Trouble relaxing 3  Restless 0  Easily annoyed or irritable 0  Afraid - awful might happen 0  Total GAD 7 Score 10     Assessment & Plan:   OSA (obstructive sleep apnea) Compliant  with cPAP  Type 2 diabetes mellitus with hyperglycemia, without long-term current use of insulin  (HCC) Well controlled with Ozempic  2 mg weekly without side effects. She is also on Crestor . Uptodate with foot exam and  eye screenings.  - Continue Ozempic  2 mg weekly  Hypertension Outpatient visit blood pressure measurements: 136/72 -> 137/77 ->132/81. Discussed lifestyle modifications at length, which patient would like to try before trial of antihypertensive. She will obtain blood pressure cuff and monitor at home. She is seen at healthy weight office monthly; advised to calibrate her home machine to their office to notice discrepancies.  - Continue to monitor at outpatient visits and start pharmacotherapy if necessary  Premature menopause Patient initially with a Nexplanon in 2009, removed in early 2014. Since patient has not had a menstrual period. Reviewed the record and TSH has been within normal levels, otherwise, no history of galactosemia, autoimmune diseases, or toxic medications. Patient  Denies vasomotor symptoms. Takes vitamin D  for vitamin D  deficiency. No DEXA on file.  Patient is now 52yo and given long history of amenorrhea, now menopause, patient will need evaluation for osteopenia/osteoporosis. - Follow up dexa scan  Mobility impaired Patient with antalgic gait from chronic bilateral knee osteoarthritis for which she receives periodic knee injections. 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 However, she will continue to benefit from parking placard.   Return in about 3 months (around 04/26/2024) for Blood pressure, .  Patient discussed with Dr. Brandt Cake, MD [facility]  01/25/2024, 4:38 PM

## 2024-01-25 NOTE — Patient Instructions (Addendum)
 Thank you, [name] for allowing us  to provide your care today. Today we discussed   Your diabetes, your weight management, your parking placard, and your health maintenance  Please remember to get your shingles vaccine at your pharmacy  Blood pressure Please measure your blood pressures with the instructions below and bring that log and your blood pressure cuff to your next weight management appointment.  If at that 1 month follow-up the blood pressure is still elevated please call me and we can add the medication we talked about (amlodipine)   BLOOD PRESSURE  Please check your blood pressure daily following the instructions below AND bring your readings to the next outpatient visit  Blood pressure tips   Here are some tips that our clinical pharmacists share for home BP monitoring:          Rest 10 minutes before taking your blood pressure.          Don't smoke or drink caffeinated beverages for at least 30 minutes before.          Take your blood pressure before (not after) you eat.          Sit comfortably with your back supported and both feet on the floor (don't cross your legs).          Elevate your arm to heart level on a table or a desk.          Use the proper sized cuff. It should fit smoothly and snugly around your bare upper arm. There should be enough room to slip a fingertip under the cuff. The bottom edge of the cuff should be 1 inch above the crease of the elbow.     Take ALL your medications before you return to clinic   I have also ordered a bone scan for you after discussing your early menopause.  This is a great assessment to know whether you have osteoporosis or problems in the bones.  I would like to know if you are having any hot flashes, changes in mood or any other changes like trouble urinating or dryness in your vagina or vulva.  There is treatment to help you with all of this and so we want a make sure that we are supporting you through this  phase of your life as well.  I have ordered the following labs for you:  Lab Orders  No laboratory test(s) ordered today     I will call if any are abnormal. All of your labs can be accessed through "My Chart".   My Chart Access: [email or web address]  Please follow-up in: 3 months, but please remember to send us  information about your blood pressures at your next weight and wellness visit as you may need medication for your blood pressure    We look forward to seeing you next time. Please call our clinic at [phone] if you have any questions or concerns. The best time to call is Monday-Friday from 9am-4pm, but there is someone available 24/7. If after hours or the weekend, call the main hospital number and ask for the Internal Medicine Resident On-Call. If you need medication refills, please notify your pharmacy one week in advance and they will send us  a request.   Thank you for letting us  take part in your care. Wishing you the best!  [name] Clunes, MD 01/25/2024, 10:03 AM [name]

## 2024-01-25 NOTE — Assessment & Plan Note (Signed)
 Compliant with cPAP

## 2024-01-29 NOTE — Progress Notes (Signed)
 Internal Medicine Clinic Attending  Case discussed with the resident at the time of the visit.  We reviewed the resident's history and exam and pertinent patient test results.  I agree with the assessment, diagnosis, and plan of care documented in the resident's note.

## 2024-02-04 DIAGNOSIS — Z419 Encounter for procedure for purposes other than remedying health state, unspecified: Secondary | ICD-10-CM | POA: Diagnosis not present

## 2024-02-16 DIAGNOSIS — G4733 Obstructive sleep apnea (adult) (pediatric): Secondary | ICD-10-CM | POA: Diagnosis not present

## 2024-02-25 ENCOUNTER — Encounter (INDEPENDENT_AMBULATORY_CARE_PROVIDER_SITE_OTHER): Payer: Self-pay | Admitting: Family Medicine

## 2024-02-25 ENCOUNTER — Ambulatory Visit (INDEPENDENT_AMBULATORY_CARE_PROVIDER_SITE_OTHER): Admitting: Family Medicine

## 2024-02-25 VITALS — BP 116/73 | HR 83 | Temp 97.9°F | Ht 62.0 in | Wt 253.0 lb

## 2024-02-25 DIAGNOSIS — Z6841 Body Mass Index (BMI) 40.0 and over, adult: Secondary | ICD-10-CM

## 2024-02-25 DIAGNOSIS — Z7985 Long-term (current) use of injectable non-insulin antidiabetic drugs: Secondary | ICD-10-CM

## 2024-02-25 DIAGNOSIS — E559 Vitamin D deficiency, unspecified: Secondary | ICD-10-CM

## 2024-02-25 DIAGNOSIS — E119 Type 2 diabetes mellitus without complications: Secondary | ICD-10-CM | POA: Diagnosis not present

## 2024-02-25 DIAGNOSIS — E1165 Type 2 diabetes mellitus with hyperglycemia: Secondary | ICD-10-CM

## 2024-02-25 DIAGNOSIS — E66813 Obesity, class 3: Secondary | ICD-10-CM

## 2024-02-25 DIAGNOSIS — E669 Obesity, unspecified: Secondary | ICD-10-CM | POA: Diagnosis not present

## 2024-02-25 DIAGNOSIS — E538 Deficiency of other specified B group vitamins: Secondary | ICD-10-CM

## 2024-02-25 MED ORDER — VITAMIN D (ERGOCALCIFEROL) 1.25 MG (50000 UNIT) PO CAPS: 50000.0000 [IU] | ORAL_CAPSULE | ORAL | 0 refills | Status: DC

## 2024-02-25 MED ORDER — OZEMPIC (2 MG/DOSE) 8 MG/3ML ~~LOC~~ SOPN: 2.0000 mg | PEN_INJECTOR | SUBCUTANEOUS | 3 refills | Status: DC

## 2024-02-25 NOTE — Progress Notes (Signed)
 Office: [phone]  /  Fax: [phone]  WEIGHT SUMMARY AND BIOMETRICS  Anthropometric Measurements Height: 5\' 2"  (1.575 m) Weight: 253 lb (114.8 kg) BMI (Calculated): 46.26 Weight at Last Visit: 254 lb Weight Lost Since Last Visit: 1 lb Weight Gained Since Last Visit: 0 Starting Weight: 267 lb Total Weight Loss (lbs): 13 lb (5.897 kg) Peak Weight: 275 lb   Body Composition  Body Fat %: 49.9 % Fat Mass (lbs): 126.6 lbs Muscle Mass (lbs): 120.6 lbs Total Body Water (lbs): 85 lbs Visceral Fat Rating : 17   Other Clinical Data Fasting: yes Labs: no Today's Visit #: 21 Starting Date: 03/16/22    Chief Complaint: OBESITY    History of Present Illness [name] [name] "[name] [name]" is a 53 year old female with obesity and type 2 diabetes who presents for obesity treatment.  She has been adhering to a category three eating plan approximately 75% of the time and engages in walking for exercise for 30 minutes, three to four times per week. Despite these efforts, she has lost only one pound in the last two and a half months. She experiences unusual cravings for sweets, particularly at night, and notes that she is not typically a 'big sweet person'. She specifically mentions craving ice cream, particularly 'Chunky Monkey' by [name] [name] and [name] [name], which she consumes when she has not eaten much during the day.  Her type 2 diabetes is currently managed with diet, exercise, weight loss, and a 2 mg dose of Ozempic , which requires a refill. The medication has reduced her daytime cravings but not her nighttime cravings. She is eating less in the daytime and may not be getting enough nutrition.  She experiences fatigue and sluggishness, which may be related to her vitamin D  deficiency. She was previously on prescription vitamin D  but was switched to an over-the-counter version. Her last vitamin D  level was 24, below the desired range of 50 to 60. She is currently taking 800 IU of vitamin D   daily.  Additionally, her B12 levels have been decreasing, likely due to inadequate protein intake. She acknowledges that her protein intake is about half of what it should be.      PHYSICAL EXAM:  Blood pressure 116/73, pulse 83, temperature 97.9 F (36.6 C), height 5\' 2"  (1.575 m), weight 253 lb (114.8 kg), SpO2 99%. Body mass index is 46.27 kg/m.  DIAGNOSTIC DATA REVIEWED:  BMET    Component Value Date/Time   NA 143 12/06/2023 1011   K 4.6 12/06/2023 1011   CL 108 (H) 12/06/2023 1011   CO2 21 12/06/2023 1011   GLUCOSE 93 12/06/2023 1011   GLUCOSE 126 (H) 03/05/2020 0924   BUN 9 12/06/2023 1011   CREATININE 0.71 12/06/2023 1011   CALCIUM  9.1 12/06/2023 1011   GFRNONAA >60 03/05/2020 0924   GFRAA >60 03/05/2020 0924   Lab Results  Component Value Date   HGBA1C 5.7 (H) 12/06/2023   HGBA1C 5.8 09/07/2014   Lab Results  Component Value Date   INSULIN  17.5 12/06/2023   INSULIN  20.0 03/20/2022   Lab Results  Component Value Date   TSH 1.260 03/20/2023   CBC    Component Value Date/Time   WBC 6.9 03/20/2023 1153   WBC 6.9 03/05/2020 0924   RBC 4.94 03/20/2023 1153   RBC 4.89 03/05/2020 0924   HGB 13.1 03/20/2023 1153   HCT 41.4 03/20/2023 1153   PLT 394 03/20/2023 1153   MCV 84 03/20/2023 1153   MCH 26.5 (L) 03/20/2023  1153   MCH 25.8 (L) 03/05/2020 0924   MCHC 31.6 03/20/2023 1153   MCHC 30.6 03/05/2020 0924   RDW 14.4 03/20/2023 1153   Iron Studies    Component Value Date/Time   IRON 36 06/27/2016 1457   TIBC 294 06/27/2016 1457   FERRITIN 140 12/09/2020 1421   IRONPCTSAT 12 (L) 06/27/2016 1457   Lipid Panel     Component Value Date/Time   CHOL 125 12/06/2023 1011   TRIG 48 12/06/2023 1011   HDL 59 12/06/2023 1011   CHOLHDL 2.9 08/03/2021 1031   LDLCALC 55 12/06/2023 1011   Hepatic Function Panel     Component Value Date/Time   PROT 6.5 12/06/2023 1011   ALBUMIN 3.7 (L) 12/06/2023 1011   AST 16 12/06/2023 1011   ALT 13 12/06/2023 1011    ALKPHOS 75 12/06/2023 1011   BILITOT 0.3 12/06/2023 1011      Component Value Date/Time   TSH 1.260 03/20/2023 1153   Nutritional Lab Results  Component Value Date   VD25OH 24.1 (L) 12/06/2023   VD25OH 30.3 08/02/2023   VD25OH 49.6 03/20/2023     Assessment and Plan Assessment & Plan Type 2 Diabetes Mellitus Type 2 diabetes managed with diet, exercise, weight loss, and Ozempic . Current dose of Ozempic  is 2 mg. Discussed Ozempic 's role in controlling blood glucose and hunger signals, noting that inadequate caloric intake can trigger cravings due to perceived nutritional deficits. - Refill Ozempic  2 mg weekly. - Continue current diet and exercise regimen. - Recheck labs in 2-3 months.  Obesity Obesity management with dietary changes. Following a category three eating plan with 75% adherence, resulting in a one-pound weight loss over two and a half months. Reports nighttime cravings for sweets, possibly due to inadequate protein intake and Ozempic  effects. Transitioning to a journaling plan to manage caloric and protein intake. Discussed alternative dietary plans including vegetarian, pescatarian, and low carbohydrate options. Opted for a journaling plan for flexibility in food choices while maintaining caloric and protein goals. - Transition to a journaling eating plan with a caloric intake of 1400-1500 calories per day and a minimum of 85 grams of protein per day. - Continue walking for exercise 30 minutes, 3-4 times per week.  B12 Deficiency B12 levels decreasing, likely due to inadequate protein intake. Emphasized increasing protein intake to address B12 deficiency and its impact on hunger and weight loss, focusing on real food sources for better outcomes. - Increase protein intake, focusing on lean meats, poultry, and seafood. - Follow up in 4 weeks.  Vitamin D  Deficiency Vitamin D  deficiency with a recent level of 24, below the target range of 50-60. Current intake of 800 IU  over-the-counter is insufficient. Discussed the impact of deficiency on fatigue and osteoporosis risk. Recommended increasing vitamin D  intake to improve energy levels. - Start vitamin D  50,000 IU weekly. - Hold over-the-counter vitamin D . - Recheck vitamin D  levels in 2 months.      She was informed of the importance of frequent follow up visits to maximize her success with intensive lifestyle modifications for her multiple health conditions.    Jasmine Mesi, MD

## 2024-03-06 DIAGNOSIS — Z419 Encounter for procedure for purposes other than remedying health state, unspecified: Secondary | ICD-10-CM | POA: Diagnosis not present

## 2024-04-02 NOTE — Progress Notes (Unsigned)
 Patient: [name] [name] Date of Birth: 1971/02/13  Reason for Visit: Follow up History from: Patient Primary Neurologist: [name] [name]  Virtual Visit via Video Note  I connected with [name] [name] on 04/03/24 at  8:45 AM EDT by a video enabled telemedicine application and verified that I am speaking with the correct person using two identifiers.  Location: Patient: in her car Provider: in the office    I discussed the limitations of evaluation and management by telemedicine and the availability of in person appointments. The patient expressed understanding and agreed to proceed.  ASSESSMENT AND PLAN 52 y.o. year old female   1.  OSA on CPAP - Recommend CPAP usage at current settings nightly for minimum 4 hours.  Continue to work with DME to replace supplies routinely.  Her AHI is well treated at 0.5.  Her mask is fitting well.  She has very good subjective benefit from CPAP.  Recommend she continue to work on weight loss.  Follow-up in 6 months. -Set up date for new machine was in November 2023, she was already established on CPAP with a baseline sleep study in November 2017 from the hospital sleep lab baseline AHI was 15.8, set up on CPAP January 2018 -After her initial consultation in August 2023 with Dr. Buck HST was ordered but was denied by insurance, we proceeded to order a new CPAP machine - Had to turn her CPAP machine in around October 2024 due to noncompliance, she had a repeat HST 07/10/23 showing moderate OSA with total AHI 26.9/hour.  A new CPAP was ordered.  Setup date 08/16/23  No orders of the defined types were placed in this encounter.  HISTORY OF PRESENT ILLNESS: Today 04/03/24 Update 04/03/24 SS: She had to turn her CPAP in after last visit due to noncompliance. HST 07/10/2023 showed moderate OSA with total AHI 26.9/hour.  Recommend proceeding with AutoPap 6-13 cm, EPR 2, mask of choice. Compliance is a little suboptimal due to travel this summer, currently 47% > 4  hour use, leak 0.0, AHI 0.5. Setup date 08/16/23. Using [facility]. CPAP is helpful when she wears it. Wakes with less headaches. Sleep is more restorative, more energy during the day with CPAP. Remains on Ozempic , working on weight loss going to healthy weight and wellness.   05/15/23 SS: Her compliance is not great, she has been traveling, not taking with her. Forgetting to put it on at night. When she wears CPAP, energy is better the next day, non-restorative. Still headaches with CPAP, in the morning, feels more pressure in her head. Thinks less headache when not wearing? Usually in morning headaches improves throughout the day. 1 migraine monthly. On ozempic , working on weight loss. Has cut soda down to 1.5 cans a day. Was drinking 3, 2 L bottles a day. Has lost at least 40 lbs in 1-2 years. Just got new nasal pillow mask, doesn't fit, went back to using FFM. ESS 9. Works as Surveyor, mining.   CPAP report 04/07/23-05/06/23 17/30 days usage at 57%, greater than 4 hours 12 days at 40%.  Average usage days used 5 hours 42 minutes. 7-13 cm water.  Leak 3.7.  AHI 0.5.  HISTORY  Today, October 26, 2022 SS: VV today due to respiratory illness. Here for new CPAP visit, the last several weeks her compliance hasn't been great. Setup date 07/31/22. HST was ordered but insurance denied. The 1st 2 weeks of January her usage was not great due to illness. She is school bus  driver. She got a new machine, but is same type.CPAP is helpful, she is sleeping better, doesn't snore, feels more rested, more energy. Tried nasal pillow mask, wants to go back to full face mask. Feels the nasal pillows are uncomfortable, moves around at night, has to constantly readjust. No issues with machine.    Review of CPAP data 07/31/2022-08/29/22 with usage 29/30 days 97%, greater than 4 hours 25 days at 83%.  Average usage days used 5 hours 26 minutes.  Minimum pressure 7, maximum pressure 13 cm water.  Leak 5.7, AHI 0.5.  95th percentile pressure  10.3.   Initial Visit 05/16/22 Dr. Buck: I saw your patient, [name] [name], upon your kind request in my sleep clinic today for initial consultation of her sleep disorder, in particular, evaluation of her prior diagnosis of obstructive sleep apnea.  The patient is unaccompanied today.  As you know, [name] [name] is a 53 year old right-handed woman with an underlying medical history of diabetes, hypertension, hyperlipidemia, migraine headaches, arthritis, anemia, asthma, low back pain, and severe obesity with a BMI of over 45, who was previously diagnosed with obstructive sleep apnea and placed on CPAP therapy.  I reviewed your office note from 04/03/2022.  She had sleep testing on 08/07/2016.  I was able to review her sleep study results.  She had a split-night sleep study and was laying on hospital sleep lab.  Baseline AHI was 15.8/h.  O2 nadir was 86%.  She was titrated from 5 cm to 10 cm at the time.  She was set up on CPAP of 10 cm in January 2018.  I was able to review her latest compliance data for the past 90 days from 02/15/2022 through 05/15/2022, during which time she used her machine 36 out of 90 days with percent use days greater than 4 hours of 28%, indicating suboptimal compliance, average AHI 1.4/h, leak acceptable with the 95th percentile at 9.8 L/min on a pressure of 10 cm with EPR of 2.  Her DME company is [employer].  Her Epworth sleepiness score is 6 out of 24, fatigue severity score is 24 out of 63.  Her mom has sleep apnea and has a CPAP machine.  Patient has been working on weight loss and has been going to the weight management clinic for the past nearly 3 months.  She has been on Ozempic  for about 2 months.  She has drastically reduced her caffeine intake from up to 2 2-liter bottles of soda per day to down to 1-1/2 cans of soda per day.  Interestingly, she has had increase in headaches in the past month and a half it may correlate with her drastic reduction of her caffeine.  She has not been  consistent with her CPAP as she felt the headaches were coming from her CPAP machine.  When she first got on her machine she did notice benefit from the treatment.  Her weight was indeed higher in the past when she was first diagnosed with sleep apnea.  Bedtime is generally between 9:30 PM and 10 PM.  Rise time is between 5 and 5:30 AM.  She works as a Designer, industrial/product, lives with her 72 year old son.  She has a boyfriend who stays over from time to time.  She is not a smoker and does not currently drink any alcohol.  She has had trouble with her mask, it does not fit as well.  She has not been in touch with her DME company which is [employer] about  this.  She has nocturia about 2-3 times per average night.  She has a TV in her bedroom but does not have it on all night.  They have no pets in the household.   REVIEW OF SYSTEMS: Out of a complete 14 system review of symptoms, the patient complains only of the following symptoms, and all other reviewed systems are negative.  See HPI  ALLERGIES: Allergies  Allergen Reactions   Cat Dander Anaphylaxis   Clorox Nasal Antiseptic [Povidone-Iodine ]     Chlorox rash.   Mushroom Extract Complex (Obsolete) Hives and Itching    Mushroom as a whole the pt is allergic to.    HOME MEDICATIONS: Outpatient Medications Prior to Visit  Medication Sig Dispense Refill   albuterol  (VENTOLIN  HFA) 108 (90 Base) MCG/ACT inhaler Inhale 2 puffs into the lungs every 6 (six) hours as needed for wheezing or shortness of breath. 8 g 0   benzonatate  (TESSALON  PERLES) 100 MG capsule Take 1 capsule (100 mg total) by mouth every 6 (six) hours as needed for cough. 30 capsule 0   cetirizine  (ZYRTEC  ALLERGY) 10 MG tablet Take 1 tablet (10 mg total) by mouth daily. 30 tablet 2   Cholecalciferol  20 MCG (800 UNIT) TABS Take 20 mcg by mouth daily. 90 tablet 3   gabapentin  (NEURONTIN ) 300 MG capsule Take 1 capsule (300 mg total) by mouth at bedtime. 90 capsule 2   rosuvastatin   (CRESTOR ) 20 MG tablet Take 1 tablet (20 mg total) by mouth daily. 90 tablet 3   Semaglutide , 2 MG/DOSE, (OZEMPIC , 2 MG/DOSE,) 8 MG/3ML SOPN Inject 2 mg into the skin once a week. 3 mL 3   Vitamin D , Ergocalciferol , (DRISDOL ) 1.25 MG (50000 UNIT) CAPS capsule Take 1 capsule (50,000 Units total) by mouth every 7 (seven) days. 5 capsule 0   No facility-administered medications prior to visit.    PAST MEDICAL HISTORY: Past Medical History:  Diagnosis Date   Acute respiratory failure with hypoxia (HCC) 06/18/2019   Allergy    SEASONAL   Anemia    Anxiety    Arthritis    left knee   Asthma    Back pain    Chronic lower back pain    on the left side   Diabetes (HCC)    Food allergy    Gallbladder problem    Headache(784.0) 02/29/2012   frequent   High cholesterol    Hyperkalemia 10/30/2018   Hypertension    Left knee pain 11/04/2015   Migraines    Neuromuscular disorder (HCC)    Siactic   Over weight    Pneumonia 2006   Post viral syndrome 07/17/2019   Renal insufficiency 06/18/2019   Sleep apnea    CPAP   SOB (shortness of breath)    Stress at home    and at work    PAST SURGICAL HISTORY: Past Surgical History:  Procedure Laterality Date   BREAST BIOPSY Left 07/29/2013   CESAREAN SECTION  08/2008   CHOLECYSTECTOMY  03/02/2012   Procedure: LAPAROSCOPIC CHOLECYSTECTOMY;  Surgeon: Lynwood MALVA Pina, MD;  Location: [facility] OR;  Service: General;;   KNEE ARTHROSCOPY WITH MENISCAL REPAIR Right 06/25/2018   Procedure: RIGHT KNEE ARTHROSCOPY WITH MENISCAL ROOT REPAIR;  Surgeon: Addie Cordella Hamilton, MD;  Location: [facility] OR;  Service: Orthopedics;  Laterality: Right;   KNEE ARTHROSCOPY WITH MENISCAL REPAIR Left 03/09/2020   Procedure: LEFT KNEE ARTHROSCOPY, MEDIAL MENISCAL ROOT REPAIR;  Surgeon: Addie Cordella Hamilton, MD;  Location: [facility] OR;  Service: Orthopedics;  Laterality: Left;   WISDOM TOOTH EXTRACTION      FAMILY HISTORY: Family History  Problem Relation Age of Onset   High  blood pressure Mother    Thyroid  disease Mother    Sleep apnea Mother    Obesity Mother    Cervical cancer Maternal Grandmother    Cancer Other    Breast cancer Neg Hx    Colon cancer Neg Hx    Colon polyps Neg Hx    Crohn's disease Neg Hx    Esophageal cancer Neg Hx    Rectal cancer Neg Hx    Stomach cancer Neg Hx     SOCIAL HISTORY: Social History   Socioeconomic History   Marital status: Single    Spouse name: Not on file   Number of children: Not on file   Years of education: Not on file   Highest education level: Not on file  Occupational History   Not on file  Tobacco Use   Smoking status: Never    Passive exposure: Current   Smokeless tobacco: Never  Vaping Use   Vaping status: Never Used  Substance and Sexual Activity   Alcohol use: No    Alcohol/week: 0.0 standard drinks of alcohol   Drug use: No   Sexual activity: Not on file  Other Topics Concern   Not on file  Social History Narrative   Caffiene, 1 cup coffee in am, soda's - 1-2 can daily.   Education :  11th grade.   Working; Surveyor, mining.    Social Drivers of Corporate investment banker Strain: Low Risk  (10/22/2023)   Overall Financial Resource Strain (CARDIA)    Difficulty of Paying Living Expenses: Not very hard  Food Insecurity: No Food Insecurity (10/22/2023)   Hunger Vital Sign    Worried About Running Out of Food in the Last Year: Never true    Ran Out of Food in the Last Year: Never true  Transportation Needs: No Transportation Needs (10/22/2023)   PRAPARE - Administrator, Civil Service (Medical): No    Lack of Transportation (Non-Medical): No  Physical Activity: Insufficiently Active (10/22/2023)   Exercise Vital Sign    Days of Exercise per Week: 2 days    Minutes of Exercise per Session: 20 min  Stress: No Stress Concern Present (10/22/2023)   [employer] of Occupational Health - Occupational Stress Questionnaire    Feeling of Stress : Only a little  Social  Connections: Moderately Isolated (10/22/2023)   Social Connection and Isolation Panel    Frequency of Communication with Friends and Family: More than three times a week    Frequency of Social Gatherings with Friends and Family: More than three times a week    Attends Religious Services: Never    Database administrator or Organizations: No    Attends Banker Meetings: Never    Marital Status: Living with partner  Intimate Partner Violence: Not At Risk (10/22/2023)   Humiliation, Afraid, Rape, and Kick questionnaire    Fear of Current or Ex-Partner: No    Emotionally Abused: No    Physically Abused: No    Sexually Abused: No   PHYSICAL EXAM  There were no vitals filed for this visit.  There is no height or weight on file to calculate BMI.  Generalized: Well developed, in no acute distress  Via video visit, is alert and oriented, speech is clear and concise, facial symmetry noted, seated in her car  DIAGNOSTIC DATA (LABS, IMAGING, TESTING) - I reviewed patient records, labs, notes, testing and imaging myself where available.  Lab Results  Component Value Date   WBC 6.9 03/20/2023   HGB 13.1 03/20/2023   HCT 41.4 03/20/2023   MCV 84 03/20/2023   PLT 394 03/20/2023      Component Value Date/Time   NA 143 12/06/2023 1011   K 4.6 12/06/2023 1011   CL 108 (H) 12/06/2023 1011   CO2 21 12/06/2023 1011   GLUCOSE 93 12/06/2023 1011   GLUCOSE 126 (H) 03/05/2020 0924   BUN 9 12/06/2023 1011   CREATININE 0.71 12/06/2023 1011   CALCIUM  9.1 12/06/2023 1011   PROT 6.5 12/06/2023 1011   ALBUMIN 3.7 (L) 12/06/2023 1011   AST 16 12/06/2023 1011   ALT 13 12/06/2023 1011   ALKPHOS 75 12/06/2023 1011   BILITOT 0.3 12/06/2023 1011   GFRNONAA >60 03/05/2020 0924   GFRAA >60 03/05/2020 0924   Lab Results  Component Value Date   CHOL 125 12/06/2023   HDL 59 12/06/2023   LDLCALC 55 12/06/2023   TRIG 48 12/06/2023   CHOLHDL 2.9 08/03/2021   Lab Results  Component Value  Date   HGBA1C 5.7 (H) 12/06/2023   Lab Results  Component Value Date   VITAMINB12 303 12/06/2023   Lab Results  Component Value Date   TSH 1.260 03/20/2023    Lauraine Born, AGNP-C, DNP 04/03/2024, 8:52 AM [facility] [address] [place], [place] [address] [phone]

## 2024-04-03 ENCOUNTER — Telehealth: Payer: Medicaid Other | Admitting: Neurology

## 2024-04-03 DIAGNOSIS — G4733 Obstructive sleep apnea (adult) (pediatric): Secondary | ICD-10-CM | POA: Diagnosis not present

## 2024-04-03 NOTE — Patient Instructions (Signed)
 Recommend CPAP usage nightly for minimum 4 hours.  Continue current settings.  Continue work with DME to replace supplies routinely.  Please ensure compliance is minimum 70% per insurance requirement.  Follow-up in 6 months or sooner if needed.  Thanks!!

## 2024-04-05 DIAGNOSIS — Z419 Encounter for procedure for purposes other than remedying health state, unspecified: Secondary | ICD-10-CM | POA: Diagnosis not present

## 2024-04-16 ENCOUNTER — Ambulatory Visit (HOSPITAL_BASED_OUTPATIENT_CLINIC_OR_DEPARTMENT_OTHER)
Admission: RE | Admit: 2024-04-16 | Discharge: 2024-04-16 | Disposition: A | Discharge status: Home / Self Care | Source: Ambulatory Visit | Attending: Internal Medicine | Admitting: Internal Medicine

## 2024-04-16 DIAGNOSIS — Z780 Asymptomatic menopausal state: Secondary | ICD-10-CM | POA: Diagnosis not present

## 2024-04-16 DIAGNOSIS — E28319 Asymptomatic premature menopause: Secondary | ICD-10-CM | POA: Diagnosis not present

## 2024-04-16 DIAGNOSIS — M85852 Other specified disorders of bone density and structure, left thigh: Secondary | ICD-10-CM | POA: Diagnosis not present

## 2024-04-17 ENCOUNTER — Ambulatory Visit (INDEPENDENT_AMBULATORY_CARE_PROVIDER_SITE_OTHER): Admitting: Family Medicine

## 2024-04-18 ENCOUNTER — Other Ambulatory Visit (INDEPENDENT_AMBULATORY_CARE_PROVIDER_SITE_OTHER): Payer: Self-pay | Admitting: Family Medicine

## 2024-04-18 DIAGNOSIS — E559 Vitamin D deficiency, unspecified: Secondary | ICD-10-CM

## 2024-04-21 ENCOUNTER — Ambulatory Visit (INDEPENDENT_AMBULATORY_CARE_PROVIDER_SITE_OTHER): Admitting: Family Medicine

## 2024-04-21 ENCOUNTER — Encounter (INDEPENDENT_AMBULATORY_CARE_PROVIDER_SITE_OTHER): Payer: Self-pay | Admitting: Family Medicine

## 2024-04-21 VITALS — BP 117/80 | HR 87 | Temp 98.0°F | Ht 62.0 in | Wt 251.0 lb

## 2024-04-21 DIAGNOSIS — F3289 Other specified depressive episodes: Secondary | ICD-10-CM

## 2024-04-21 DIAGNOSIS — Z6841 Body Mass Index (BMI) 40.0 and over, adult: Secondary | ICD-10-CM | POA: Diagnosis not present

## 2024-04-21 DIAGNOSIS — E669 Obesity, unspecified: Secondary | ICD-10-CM | POA: Diagnosis not present

## 2024-04-21 DIAGNOSIS — E1165 Type 2 diabetes mellitus with hyperglycemia: Secondary | ICD-10-CM

## 2024-04-21 DIAGNOSIS — E559 Vitamin D deficiency, unspecified: Secondary | ICD-10-CM | POA: Diagnosis not present

## 2024-04-21 MED ORDER — OZEMPIC (2 MG/DOSE) 8 MG/3ML ~~LOC~~ SOPN: 2.0000 mg | PEN_INJECTOR | SUBCUTANEOUS | 3 refills | Status: DC

## 2024-04-21 MED ORDER — VITAMIN D (ERGOCALCIFEROL) 1.25 MG (50000 UNIT) PO CAPS: 50000.0000 [IU] | ORAL_CAPSULE | ORAL | 0 refills | Status: DC

## 2024-04-21 MED ORDER — BUPROPION HCL ER (SR) 150 MG PO TB12: 150.0000 mg | ORAL_TABLET | Freq: Every day | ORAL | 0 refills | Status: DC

## 2024-04-21 NOTE — Addendum Note (Signed)
 Addended by: LAFE BAKER CROME on: 04/21/2024 11:13 AM   Modules accepted: Orders

## 2024-04-21 NOTE — Progress Notes (Signed)
 Office: [phone]  /  Fax: [phone]  WEIGHT SUMMARY AND BIOMETRICS  Anthropometric Measurements Height: 5' 2 (1.575 m) Weight: 251 lb (113.9 kg) BMI (Calculated): 45.9 Weight at Last Visit: 253 lb Weight Lost Since Last Visit: 2 lb Weight Gained Since Last Visit: 0 Starting Weight: 267 lb Total Weight Loss (lbs): 16 lb (7.258 kg) Peak Weight: 275 lb   Body Composition  Body Fat %: 50.3 % Fat Mass (lbs): 126.6 lbs Muscle Mass (lbs): 118.6 lbs Total Body Water (lbs): 83.2 lbs Visceral Fat Rating : 17   Other Clinical Data Fasting: no Labs: no Today's Visit #: 22 Starting Date: 03/16/22    Chief Complaint: OBESITY   History of Present Illness [name] [name] is a 53 year old female who presents for obesity treatment follow-up.  She is adhering to a category three eating plan 85% of the time and engages in physical activity by walking 30 to 35 minutes four times per week. She has lost two pounds over the last two months since her previous visit.  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 She experiences more stress during the weekdays when she is not working, as she is anxious to return to work and manage her financial responsibilities independently without asking her parents for help.  She describes a craving for salty foods and mentions eating raw vegetables with salt. Her current medications include vitamin D  and Ozempic , which she plans to pick up from the pharmacy. She follows a journaling plan with a caloric intake of 1400 to 1500 calories and is also incorporating elements of a category three meal plan.  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 No history of kidney stones or seizure disorders.      PHYSICAL EXAM:  Blood pressure 117/80, pulse 87, temperature 98 F (36.7 C), height 5'  2 (1.575 m), weight 251 lb (113.9 kg), SpO2 99%. Body mass index is 45.91 kg/m.  DIAGNOSTIC DATA REVIEWED:  BMET    Component Value Date/Time   NA 143 12/06/2023 1011   K 4.6 12/06/2023 1011   CL 108 (H) 12/06/2023 1011   CO2 21 12/06/2023 1011   GLUCOSE 93 12/06/2023 1011   GLUCOSE 126 (H) 03/05/2020 0924   BUN 9 12/06/2023 1011   CREATININE 0.71 12/06/2023 1011   CALCIUM  9.1 12/06/2023 1011   GFRNONAA >60 03/05/2020 0924   GFRAA >60 03/05/2020 0924   Lab Results  Component Value Date   HGBA1C 5.7 (H) 12/06/2023   HGBA1C 5.8 09/07/2014   Lab Results  Component Value Date   INSULIN  17.5 12/06/2023   INSULIN  20.0 03/20/2022   Lab Results  Component Value Date   TSH 1.260 03/20/2023   CBC    Component Value Date/Time   WBC 6.9 03/20/2023 1153   WBC 6.9 03/05/2020 0924   RBC 4.94 03/20/2023 1153   RBC 4.89 03/05/2020 0924   HGB 13.1 03/20/2023 1153   HCT 41.4 03/20/2023 1153   PLT 394 03/20/2023 1153   MCV 84 03/20/2023 1153   MCH 26.5 (L) 03/20/2023 1153   MCH 25.8 (L) 03/05/2020 0924   MCHC 31.6 03/20/2023 1153   MCHC 30.6 03/05/2020 0924   RDW 14.4 03/20/2023 1153   Iron Studies    Component Value Date/Time   IRON 36 06/27/2016 1457   TIBC  294 06/27/2016 1457   FERRITIN 140 12/09/2020 1421   IRONPCTSAT 12 (L) 06/27/2016 1457   Lipid Panel     Component Value Date/Time   CHOL 125 12/06/2023 1011   TRIG 48 12/06/2023 1011   HDL 59 12/06/2023 1011   CHOLHDL 2.9 08/03/2021 1031   LDLCALC 55 12/06/2023 1011   Hepatic Function Panel     Component Value Date/Time   PROT 6.5 12/06/2023 1011   ALBUMIN 3.7 (L) 12/06/2023 1011   AST 16 12/06/2023 1011   ALT 13 12/06/2023 1011   ALKPHOS 75 12/06/2023 1011   BILITOT 0.3 12/06/2023 1011      Component Value Date/Time   TSH 1.260 03/20/2023 1153   Nutritional Lab Results  Component Value Date   VD25OH 24.1 (L) 12/06/2023   VD25OH 30.3 08/02/2023   VD25OH 49.6 03/20/2023     Assessment and  Plan Assessment & Plan Obesity She is adhering to a category three eating plan 85% of the time and engages in physical activity by walking 30-35 minutes four times per week. She has lost two pounds in the last two months. Reports increased hunger and snacking, particularly on salty foods, likely related to stress from not working during the summer and financial concerns. Discussed the role of simple carbohydrates in stress eating and potential emotional eating behaviors. Bupropion  is proposed to manage emotional eating behaviors and stress-related cravings. It is expected to take about two weeks to take effect, with potential side effects including dry mouth and a rare increase in blood pressure. It may also provide productive energy without jitteriness. - Continue category three eating plan and physical activity regimen. - Start bupropion  to manage emotional eating behaviors and stress-related cravings. - Monitor for side effects of bupropion , including dry mouth and potential increase in blood pressure. - Educate on the importance of journaling food intake, calories, and protein. - Encourage the use of food journaling apps like MyFitnessPal or Lose It. - Advise against mixing different eating plans on the same day.  Vitamin D  deficiency Her last vitamin D  level was 24 in March. Plans to monitor vitamin D  levels and continue supplementation. - Refill vitamin D  prescription. - Plan for fasting labs at the next visit to reassess vitamin D  levels.      She was informed of the importance of frequent follow up visits to maximize her success with intensive lifestyle modifications for her multiple health conditions. Follow up in 4 weeks   Louann Penton, MD

## 2024-05-06 DIAGNOSIS — Z419 Encounter for procedure for purposes other than remedying health state, unspecified: Secondary | ICD-10-CM | POA: Diagnosis not present

## 2024-05-15 ENCOUNTER — Ambulatory Visit (INDEPENDENT_AMBULATORY_CARE_PROVIDER_SITE_OTHER): Admitting: Family Medicine

## 2024-05-15 ENCOUNTER — Encounter (INDEPENDENT_AMBULATORY_CARE_PROVIDER_SITE_OTHER): Payer: Self-pay | Admitting: Family Medicine

## 2024-05-15 VITALS — BP 110/82 | HR 83 | Temp 98.0°F | Ht 62.0 in | Wt 247.0 lb

## 2024-05-15 DIAGNOSIS — E669 Obesity, unspecified: Secondary | ICD-10-CM

## 2024-05-15 DIAGNOSIS — Z6841 Body Mass Index (BMI) 40.0 and over, adult: Secondary | ICD-10-CM

## 2024-05-15 DIAGNOSIS — Z7985 Long-term (current) use of injectable non-insulin antidiabetic drugs: Secondary | ICD-10-CM

## 2024-05-15 DIAGNOSIS — E1165 Type 2 diabetes mellitus with hyperglycemia: Secondary | ICD-10-CM

## 2024-05-15 DIAGNOSIS — F5089 Other specified eating disorder: Secondary | ICD-10-CM | POA: Diagnosis not present

## 2024-05-15 DIAGNOSIS — E559 Vitamin D deficiency, unspecified: Secondary | ICD-10-CM | POA: Diagnosis not present

## 2024-05-15 DIAGNOSIS — F3289 Other specified depressive episodes: Secondary | ICD-10-CM

## 2024-05-15 DIAGNOSIS — E119 Type 2 diabetes mellitus without complications: Secondary | ICD-10-CM

## 2024-05-15 DIAGNOSIS — E66813 Obesity, class 3: Secondary | ICD-10-CM

## 2024-05-15 MED ORDER — BUPROPION HCL ER (SR) 150 MG PO TB12: 150.0000 mg | ORAL_TABLET | Freq: Every day | ORAL | 0 refills | Status: DC

## 2024-05-15 MED ORDER — VITAMIN D (ERGOCALCIFEROL) 1.25 MG (50000 UNIT) PO CAPS: 50000.0000 [IU] | ORAL_CAPSULE | ORAL | 0 refills | Status: DC

## 2024-05-15 MED ORDER — OZEMPIC (2 MG/DOSE) 8 MG/3ML ~~LOC~~ SOPN: 2.0000 mg | PEN_INJECTOR | SUBCUTANEOUS | 3 refills | Status: DC

## 2024-05-15 NOTE — Progress Notes (Signed)
 Office: [phone]  /  Fax: [phone]  WEIGHT SUMMARY AND BIOMETRICS  Anthropometric Measurements Height: 5' 2 (1.575 m) Weight: 247 lb (112 kg) BMI (Calculated): 45.17 Weight at Last Visit: 251 lb Weight Lost Since Last Visit: 4 lb Weight Gained Since Last Visit: 0 Starting Weight: 267 lb Total Weight Loss (lbs): 20 lb (9.072 kg) Peak Weight: 275 lb   Body Composition  Body Fat %: 47.1 % Fat Mass (lbs): 116.6 lbs Muscle Mass (lbs): 124.2 lbs Total Body Water (lbs): 79.4 lbs Visceral Fat Rating : 16   Other Clinical Data Fasting: yes Labs: no Today's Visit #: 23 Starting Date: 03/16/22 Comments: [id number]    Chief Complaint: OBESITY    History of Present Illness [name] [name] is a 53 year old female with obesity, vitamin D  deficiency, type 2 diabetes, and emotional eating behaviors who presents for obesity treatment and progress assessment.  She is following a diet plan of 1400 to 1500 calories with a goal of consuming 85 or more grams of protein, adhering to this plan about 70% of the time. She is working on increasing her intake of fruits and vegetables and meeting her protein goals, although she often consumes less than 50 grams of protein per day. She has lost 4 pounds in the last month since her last visit. She exercises three days a week for 30 minutes by walking, both indoors and outdoors, although recent rain has limited her outdoor activity. She sleeps between 7 to 9 hours per night but sometimes skips meals and does not drink adequate water regularly.  She is being treated for vitamin D  deficiency with a prescription of 50,000 IU of vitamin D  weekly and requests a refill.  She manages her type 2 diabetes with diet, exercise, and weight loss. She is on Ozempic  and requests a refill. No nausea with this medication.  She is diagnosed with emotional eating behaviors and is being treated with Wellbutrin  SR, 150 mg daily, and requests a refill.  She acknowledges challenges with snacking, particularly sweets, and is working on incorporating higher protein snacks into her diet.      PHYSICAL EXAM:  Blood pressure 110/82, pulse 83, temperature 98 F (36.7 C), height 5' 2 (1.575 m), weight 247 lb (112 kg), SpO2 98%. Body mass index is 45.18 kg/m.  DIAGNOSTIC DATA REVIEWED:  BMET    Component Value Date/Time   NA 143 12/06/2023 1011   K 4.6 12/06/2023 1011   CL 108 (H) 12/06/2023 1011   CO2 21 12/06/2023 1011   GLUCOSE 93 12/06/2023 1011   GLUCOSE 126 (H) 03/05/2020 0924   BUN 9 12/06/2023 1011   CREATININE 0.71 12/06/2023 1011   CALCIUM  9.1 12/06/2023 1011   GFRNONAA >60 03/05/2020 0924   GFRAA >60 03/05/2020 0924   Lab Results  Component Value Date   HGBA1C 5.7 (H) 12/06/2023   HGBA1C 5.8 09/07/2014   Lab Results  Component Value Date   INSULIN  17.5 12/06/2023   INSULIN  20.0 03/20/2022   Lab Results  Component Value Date   TSH 1.260 03/20/2023   CBC    Component Value Date/Time   WBC 6.9 03/20/2023 1153   WBC 6.9 03/05/2020 0924   RBC 4.94 03/20/2023 1153   RBC 4.89 03/05/2020 0924   HGB 13.1 03/20/2023 1153   HCT 41.4 03/20/2023 1153   PLT 394 03/20/2023 1153   MCV 84 03/20/2023 1153   MCH 26.5 (L) 03/20/2023 1153   MCH 25.8 (L) 03/05/2020 9075  MCHC 31.6 03/20/2023 1153   MCHC 30.6 03/05/2020 0924   RDW 14.4 03/20/2023 1153   Iron Studies    Component Value Date/Time   IRON 36 06/27/2016 1457   TIBC 294 06/27/2016 1457   FERRITIN 140 12/09/2020 1421   IRONPCTSAT 12 (L) 06/27/2016 1457   Lipid Panel     Component Value Date/Time   CHOL 125 12/06/2023 1011   TRIG 48 12/06/2023 1011   HDL 59 12/06/2023 1011   CHOLHDL 2.9 08/03/2021 1031   LDLCALC 55 12/06/2023 1011   Hepatic Function Panel     Component Value Date/Time   PROT 6.5 12/06/2023 1011   ALBUMIN 3.7 (L) 12/06/2023 1011   AST 16 12/06/2023 1011   ALT 13 12/06/2023 1011   ALKPHOS 75 12/06/2023 1011   BILITOT 0.3  12/06/2023 1011      Component Value Date/Time   TSH 1.260 03/20/2023 1153   Nutritional Lab Results  Component Value Date   VD25OH 24.1 (L) 12/06/2023   VD25OH 30.3 08/02/2023   VD25OH 49.6 03/20/2023     Assessment and Plan Assessment & Plan Obesity Obesity management focuses on dietary intake and physical activity. Current dietary plan includes 1400-1500 calories and 85 or more grams of protein. Achieving dietary goals approximately 70% of the time. Engaging in physical activity with walking three days a week for 30 minutes. Recent weight loss of 4 pounds in the last month. Challenges include skipping meals, inadequate water intake, and low protein intake (40-45 grams). - Provide higher protein snack list and protein pudding recipe - Educate on 10:1 calorie to protein ratio - Encourage adequate protein intake, especially at breakfast - Suggest alternative indoor exercises during inclement weather  Type 2 diabetes mellitus Type 2 diabetes is managed with diet, exercise, weight loss, and Ozempic . Current A1c levels are satisfactory, indicating low risk of complications. No nausea reported with Ozempic  use. Concerns about increased snacking due to weather-related inactivity. - Refill Ozempic  prescription - Order labs to check blood sugars, kidney function, and fasting insulin   Vitamin D  deficiency Vitamin D  deficiency is treated with prescription vitamin D  50,000 IU weekly. Monitoring of vitamin D  levels is ongoing. - Refill vitamin D  prescription - Order labs to check vitamin D  levels and B12  Emotional eating behaviors Emotional eating behaviors are managed with Wellbutrin  SR 150 mg daily. No reported issues with current medication regimen. - Refill Wellbutrin  SR prescription - Work on not skipping meals to help decrease EEB   She was informed of the importance of frequent follow up visits to maximize her success with intensive lifestyle modifications for her multiple health  conditions.    Louann Penton, MD

## 2024-05-16 LAB — CMP14+EGFR
ALT: 9 IU/L (ref 0–32)
AST: 15 IU/L (ref 0–40)
Albumin: 3.9 g/dL (ref 3.8–4.9)
Alkaline Phosphatase: 81 IU/L (ref 44–121)
BUN/Creatinine Ratio: 11 (ref 9–23)
BUN: 9 mg/dL (ref 6–24)
Bilirubin Total: 0.4 mg/dL (ref 0.0–1.2)
CO2: 21 mmol/L (ref 20–29)
Calcium: 9.4 mg/dL (ref 8.7–10.2)
Chloride: 105 mmol/L (ref 96–106)
Creatinine, Ser: 0.79 mg/dL (ref 0.57–1.00)
Globulin, Total: 3.3 g/dL (ref 1.5–4.5)
Glucose: 99 mg/dL (ref 70–99)
Potassium: 4.3 mmol/L (ref 3.5–5.2)
Sodium: 141 mmol/L (ref 134–144)
Total Protein: 7.2 g/dL (ref 6.0–8.5)
eGFR: 91 mL/min/1.73 (ref >59)

## 2024-05-16 LAB — VITAMIN B12: Vitamin B-12: 314 pg/mL (ref 232–1245)

## 2024-05-16 LAB — HEMOGLOBIN A1C
Est. average glucose Bld gHb Est-mCnc: 120 mg/dL
Hgb A1c MFr Bld: 5.8 % — ABNORMAL HIGH (ref 4.8–5.6)

## 2024-05-16 LAB — INSULIN, RANDOM: INSULIN: 28.7 u[IU]/mL — ABNORMAL HIGH (ref 2.6–24.9)

## 2024-05-16 LAB — VITAMIN D 25 HYDROXY (VIT D DEFICIENCY, FRACTURES): Vit D, 25-Hydroxy: 36.8 ng/mL (ref 30.0–100.0)

## 2024-06-03 ENCOUNTER — Telehealth: Payer: Self-pay | Admitting: Neurology

## 2024-06-03 NOTE — Telephone Encounter (Signed)
 Message sent to DME [employer]  [phone]

## 2024-06-03 NOTE — Telephone Encounter (Signed)
 Patient stated that [employer] called her and stated that they are needing insurance information from us  so that patient can keep her machine. Pt would like a call to discuss, best call back # is [phone]

## 2024-06-06 DIAGNOSIS — Z419 Encounter for procedure for purposes other than remedying health state, unspecified: Secondary | ICD-10-CM | POA: Diagnosis not present

## 2024-06-13 ENCOUNTER — Ambulatory Visit: Payer: Self-pay | Admitting: Student

## 2024-06-17 ENCOUNTER — Ambulatory Visit (INDEPENDENT_AMBULATORY_CARE_PROVIDER_SITE_OTHER): Admitting: Family Medicine

## 2024-06-30 ENCOUNTER — Encounter: Admitting: Neurology

## 2024-07-02 ENCOUNTER — Encounter: Payer: Self-pay | Admitting: Neurology

## 2024-07-02 ENCOUNTER — Ambulatory Visit: Admitting: Neurology

## 2024-07-02 VITALS — BP 126/85 | HR 66 | Resp 15 | Ht 62.0 in

## 2024-07-02 DIAGNOSIS — G4733 Obstructive sleep apnea (adult) (pediatric): Secondary | ICD-10-CM

## 2024-07-02 NOTE — Patient Instructions (Signed)
 Great to see you today! Continue CPAP usage minimum 4 hours nightly Continue current settings Continue to replace supplies routinely through DME Follow-up in 1 year or sooner if needed. Thanks!!

## 2024-07-02 NOTE — Progress Notes (Signed)
 Patient: [name] [name] Date of Birth: November 01, 1970  Reason for Visit: Follow up History from: Patient Primary Neurologist: Buck  ASSESSMENT AND PLAN 53 y.o. year old female   1.  OSA on CPAP  -Doing well on CPAP.  Has good compliance.  Continue nightly use minimum 4 hours.  Continue current settings.  Her AHI is well treated at 0.9.  She has very good subjective benefit from CPAP! -Continue to work on weight loss, has already lost 20 pounds! -Follow-up with me in 1 year virtually -Set up date for new machine was in November 2023, she was already established on CPAP with a baseline sleep study in November 2017 from the hospital sleep lab baseline AHI was 15.8, set up on CPAP January 2018 -After her initial consultation in August 2023 with Dr. Buck HST was ordered but was denied by insurance, we proceeded to order a new CPAP machine - Had to turn her CPAP machine in around October 2024 due to noncompliance, she had a repeat HST 07/10/23 showing moderate OSA with total AHI 26.9/hour.  A new CPAP was ordered.  Setup date 08/16/23   HISTORY OF PRESENT ILLNESS: Today 07/02/24 Update 07/02/24 SS: CPAP report shows 80% compliance > 4 hours, 6-13 cm water, leak 1.6, AHI 0.9. Doing great with CPAP. Can tell when she doesn't use her CPAP, feels sleepy the next day, very tired. Using FFM. Feels good with settings. CPAP working great. Has lost 20 lbs, on Ozempic .   Update 04/03/24 SS: She had to turn her CPAP in after last visit due to noncompliance. HST 07/10/2023 showed moderate OSA with total AHI 26.9/hour.  Recommend proceeding with AutoPap 6-13 cm, EPR 2, mask of choice. Compliance is a little suboptimal due to travel this summer, currently 47% > 4 hour use, leak 0.0, AHI 0.5. Setup date 08/16/23. Using FFM. CPAP is helpful when she wears it. Wakes with less headaches. Sleep is more restorative, more energy during the day with CPAP. Remains on Ozempic , working on weight loss going to healthy  weight and wellness.   05/15/23 SS: Her compliance is not great, she has been traveling, not taking with her. Forgetting to put it on at night. When she wears CPAP, energy is better the next day, non-restorative. Still headaches with CPAP, in the morning, feels more pressure in her head. Thinks less headache when not wearing? Usually in morning headaches improves throughout the day. 1 migraine monthly. On ozempic , working on weight loss. Has cut soda down to 1.5 cans a day. Was drinking 3, 2 L bottles a day. Has lost at least 40 lbs in 1-2 years. Just got new nasal pillow mask, doesn't fit, went back to using FFM. ESS 9. Works as Surveyor, mining.   CPAP report 04/07/23-05/06/23 17/30 days usage at 57%, greater than 4 hours 12 days at 40%.  Average usage days used 5 hours 42 minutes. 7-13 cm water.  Leak 3.7.  AHI 0.5.  HISTORY  Today, October 26, 2022 SS: VV today due to respiratory illness. Here for new CPAP visit, the last several weeks her compliance hasn't been great. Setup date 07/31/22. HST was ordered but insurance denied. The 1st 2 weeks of January her usage was not great due to illness. She is school bus driver. She got a new machine, but is same type.CPAP is helpful, she is sleeping better, doesn't snore, feels more rested, more energy. Tried nasal pillow mask, wants to go back to full face mask. Feels the nasal  pillows are uncomfortable, moves around at night, has to constantly readjust. No issues with machine.    Review of CPAP data 07/31/2022-08/29/22 with usage 29/30 days 97%, greater than 4 hours 25 days at 83%.  Average usage days used 5 hours 26 minutes.  Minimum pressure 7, maximum pressure 13 cm water.  Leak 5.7, AHI 0.5.  95th percentile pressure 10.3.   Initial Visit 05/16/22 Dr. Buck: I saw your patient, [name] [name], upon your kind request in my sleep clinic today for initial consultation of her sleep disorder, in particular, evaluation of her prior diagnosis of obstructive sleep  apnea.  The patient is unaccompanied today.  As you know, [name] [name] is a 53 year old right-handed woman with an underlying medical history of diabetes, hypertension, hyperlipidemia, migraine headaches, arthritis, anemia, asthma, low back pain, and severe obesity with a BMI of over 45, who was previously diagnosed with obstructive sleep apnea and placed on CPAP therapy.  I reviewed your office note from 04/03/2022.  She had sleep testing on 08/07/2016.  I was able to review her sleep study results.  She had a split-night sleep study and was laying on hospital sleep lab.  Baseline AHI was 15.8/h.  O2 nadir was 86%.  She was titrated from 5 cm to 10 cm at the time.  She was set up on CPAP of 10 cm in January 2018.  I was able to review her latest compliance data for the past 90 days from 02/15/2022 through 05/15/2022, during which time she used her machine 36 out of 90 days with percent use days greater than 4 hours of 28%, indicating suboptimal compliance, average AHI 1.4/h, leak acceptable with the 95th percentile at 9.8 L/min on a pressure of 10 cm with EPR of 2.  Her DME company is [employer].  Her Epworth sleepiness score is 6 out of 24, fatigue severity score is 24 out of 63.  Her mom has sleep apnea and has a CPAP machine.  Patient has been working on weight loss and has been going to the weight management clinic for the past nearly 3 months.  She has been on Ozempic  for about 2 months.  She has drastically reduced her caffeine intake from up to 2 2-liter bottles of soda per day to down to 1-1/2 cans of soda per day.  Interestingly, she has had increase in headaches in the past month and a half it may correlate with her drastic reduction of her caffeine.  She has not been consistent with her CPAP as she felt the headaches were coming from her CPAP machine.  When she first got on her machine she did notice benefit from the treatment.  Her weight was indeed higher in the past when she was first diagnosed with  sleep apnea.  Bedtime is generally between 9:30 PM and 10 PM.  Rise time is between 5 and 5:30 AM.  She works as a Designer, industrial/product, lives with her 104 year old son.  She has a boyfriend who stays over from time to time.  She is not a smoker and does not currently drink any alcohol.  She has had trouble with her mask, it does not fit as well.  She has not been in touch with her DME company which is [employer] about this.  She has nocturia about 2-3 times per average night.  She has a TV in her bedroom but does not have it on all night.  They have no pets in the household.   REVIEW  OF SYSTEMS: Out of a complete 14 system review of symptoms, the patient complains only of the following symptoms, and all other reviewed systems are negative.  See HPI  ALLERGIES: Allergies  Allergen Reactions   Cat Dander Anaphylaxis   Clorox Nasal Antiseptic [Povidone-Iodine ]     Chlorox rash.   Mushroom Extract Complex (Obsolete) Hives and Itching    Mushroom as a whole the pt is allergic to.    HOME MEDICATIONS: Outpatient Medications Prior to Visit  Medication Sig Dispense Refill   albuterol  (VENTOLIN  HFA) 108 (90 Base) MCG/ACT inhaler Inhale 2 puffs into the lungs every 6 (six) hours as needed for wheezing or shortness of breath. 8 g 0   benzonatate  (TESSALON  PERLES) 100 MG capsule Take 1 capsule (100 mg total) by mouth every 6 (six) hours as needed for cough. 30 capsule 0   buPROPion  (WELLBUTRIN  SR) 150 MG 12 hr tablet Take 1 tablet (150 mg total) by mouth daily. 30 tablet 0   cetirizine  (ZYRTEC  ALLERGY) 10 MG tablet Take 1 tablet (10 mg total) by mouth daily. 30 tablet 2   Cholecalciferol  20 MCG (800 UNIT) TABS Take 20 mcg by mouth daily. 90 tablet 3   gabapentin  (NEURONTIN ) 300 MG capsule Take 1 capsule (300 mg total) by mouth at bedtime. 90 capsule 2   rosuvastatin  (CRESTOR ) 20 MG tablet Take 1 tablet (20 mg total) by mouth daily. 90 tablet 3   Semaglutide , 2 MG/DOSE, (OZEMPIC , 2 MG/DOSE,) 8 MG/3ML SOPN  Inject 2 mg into the skin once a week. 3 mL 3   Vitamin D , Ergocalciferol , (DRISDOL ) 1.25 MG (50000 UNIT) CAPS capsule Take 1 capsule (50,000 Units total) by mouth every 7 (seven) days. 5 capsule 0   No facility-administered medications prior to visit.    PAST MEDICAL HISTORY: Past Medical History:  Diagnosis Date   Acute respiratory failure with hypoxia (HCC) 06/18/2019   Allergy    SEASONAL   Anemia    Anxiety    Arthritis    left knee   Asthma    Back pain    Chronic lower back pain    on the left side   Diabetes (HCC)    Food allergy    Gallbladder problem    Headache(784.0) 02/29/2012   frequent   High cholesterol    Hyperkalemia 10/30/2018   Hypertension    Left knee pain 11/04/2015   Migraines    Neuromuscular disorder (HCC)    Siactic   Over weight    Pneumonia 2006   Post viral syndrome 07/17/2019   Renal insufficiency 06/18/2019   Sleep apnea    CPAP   SOB (shortness of breath)    Stress at home    and at work    PAST SURGICAL HISTORY: Past Surgical History:  Procedure Laterality Date   BREAST BIOPSY Left 07/29/2013   CESAREAN SECTION  08/2008   CHOLECYSTECTOMY  03/02/2012   Procedure: LAPAROSCOPIC CHOLECYSTECTOMY;  Surgeon: Lynwood MALVA Pina, MD;  [facility] OR;  Service: General;;   KNEE ARTHROSCOPY WITH MENISCAL REPAIR Right 06/25/2018   Procedure: RIGHT KNEE ARTHROSCOPY WITH MENISCAL ROOT REPAIR;  Surgeon: Addie Cordella Hamilton, MD;  Location: [facility] OR;  Service: Orthopedics;  Laterality: Right;   KNEE ARTHROSCOPY WITH MENISCAL REPAIR Left 03/09/2020   Procedure: LEFT KNEE ARTHROSCOPY, MEDIAL MENISCAL ROOT REPAIR;  Surgeon: Addie Cordella Hamilton, MD;  Location: [facility] OR;  Service: Orthopedics;  Laterality: Left;   WISDOM TOOTH EXTRACTION      FAMILY HISTORY: Family  History  Problem Relation Age of Onset   High blood pressure Mother    Thyroid  disease Mother    Sleep apnea Mother    Obesity Mother    Cervical cancer Maternal Grandmother    Cancer  Other    Breast cancer Neg Hx    Colon cancer Neg Hx    Colon polyps Neg Hx    Crohn's disease Neg Hx    Esophageal cancer Neg Hx    Rectal cancer Neg Hx    Stomach cancer Neg Hx     SOCIAL HISTORY: Social History   Socioeconomic History   Marital status: Single    Spouse name: Not on file   Number of children: Not on file   Years of education: Not on file   Highest education level: Not on file  Occupational History   Not on file  Tobacco Use   Smoking status: Never    Passive exposure: Current   Smokeless tobacco: Never  Vaping Use   Vaping status: Never Used  Substance and Sexual Activity   Alcohol use: No    Alcohol/week: 0.0 standard drinks of alcohol   Drug use: No   Sexual activity: Not on file  Other Topics Concern   Not on file  Social History Narrative   Caffiene, 1 cup coffee in am, soda's - 1-2 can daily.   Education :  11th grade.   Working; Surveyor, mining.    Social Drivers of Corporate investment banker Strain: Low Risk  (10/22/2023)   Overall Financial Resource Strain (CARDIA)    Difficulty of Paying Living Expenses: Not very hard  Food Insecurity: No Food Insecurity (10/22/2023)   Hunger Vital Sign    Worried About Running Out of Food in the Last Year: Never true    Ran Out of Food in the Last Year: Never true  Transportation Needs: No Transportation Needs (10/22/2023)   PRAPARE - Administrator, Civil Service (Medical): No    Lack of Transportation (Non-Medical): No  Physical Activity: Insufficiently Active (10/22/2023)   Exercise Vital Sign    Days of Exercise per Week: 2 days    Minutes of Exercise per Session: 20 min  Stress: No Stress Concern Present (10/22/2023)   [employer] of Occupational Health - Occupational Stress Questionnaire    Feeling of Stress : Only a little  Social Connections: Moderately Isolated (10/22/2023)   Social Connection and Isolation Panel    Frequency of Communication with Friends and Family: More  than three times a week    Frequency of Social Gatherings with Friends and Family: More than three times a week    Attends Religious Services: Never    Database administrator or Organizations: No    Attends Banker Meetings: Never    Marital Status: Living with partner  Intimate Partner Violence: Not At Risk (10/22/2023)   Humiliation, Afraid, Rape, and Kick questionnaire    Fear of Current or Ex-Partner: No    Emotionally Abused: No    Physically Abused: No    Sexually Abused: No   PHYSICAL EXAM  Vitals:   07/02/24 0954  BP: 126/85  Pulse: 66  Resp: 15  SpO2: 96%  Height: 5' 2 (1.575 m)    Body mass index is 45.18 kg/m.  Physical Exam  General: The patient is alert and cooperative at the time of the examination.  Skin: No significant peripheral edema is noted.  Neurologic Exam  Mental status:  The patient is alert and oriented x 3 at the time of the examination. The patient has apparent normal recent and remote memory, with an apparently normal attention span and concentration ability.  Cranial nerves: Facial symmetry is present. Speech is normal, no aphasia or dysarthria is noted. Extraocular movements are full.   Motor: The patient has good strength in all 4 extremities.  Gait and station: The patient has a normal gait.   DIAGNOSTIC DATA (LABS, IMAGING, TESTING) - I reviewed patient records, labs, notes, testing and imaging myself where available.  Lab Results  Component Value Date   WBC 6.9 03/20/2023   HGB 13.1 03/20/2023   HCT 41.4 03/20/2023   MCV 84 03/20/2023   PLT 394 03/20/2023      Component Value Date/Time   NA 141 05/15/2024 0920   K 4.3 05/15/2024 0920   CL 105 05/15/2024 0920   CO2 21 05/15/2024 0920   GLUCOSE 99 05/15/2024 0920   GLUCOSE 126 (H) 03/05/2020 0924   BUN 9 05/15/2024 0920   CREATININE 0.79 05/15/2024 0920   CALCIUM  9.4 05/15/2024 0920   PROT 7.2 05/15/2024 0920   ALBUMIN 3.9 05/15/2024 0920   AST 15  05/15/2024 0920   ALT 9 05/15/2024 0920   ALKPHOS 81 05/15/2024 0920   BILITOT 0.4 05/15/2024 0920   GFRNONAA >60 03/05/2020 0924   GFRAA >60 03/05/2020 0924   Lab Results  Component Value Date   CHOL 125 12/06/2023   HDL 59 12/06/2023   LDLCALC 55 12/06/2023   TRIG 48 12/06/2023   CHOLHDL 2.9 08/03/2021   Lab Results  Component Value Date   HGBA1C 5.8 (H) 05/15/2024   Lab Results  Component Value Date   VITAMINB12 314 05/15/2024   Lab Results  Component Value Date   TSH 1.260 03/20/2023    Lauraine Born, AGNP-C, DNP 07/02/2024, 10:24 AM [facility] [address], [address] [place], [place] [address] [phone]

## 2024-07-22 ENCOUNTER — Encounter (INDEPENDENT_AMBULATORY_CARE_PROVIDER_SITE_OTHER): Payer: Self-pay | Admitting: Family Medicine

## 2024-07-22 ENCOUNTER — Ambulatory Visit (INDEPENDENT_AMBULATORY_CARE_PROVIDER_SITE_OTHER): Admitting: Family Medicine

## 2024-07-22 VITALS — BP 87/60 | HR 90 | Temp 98.2°F | Ht 62.0 in | Wt 247.0 lb

## 2024-07-22 DIAGNOSIS — E785 Hyperlipidemia, unspecified: Secondary | ICD-10-CM | POA: Diagnosis not present

## 2024-07-22 DIAGNOSIS — E119 Type 2 diabetes mellitus without complications: Secondary | ICD-10-CM | POA: Diagnosis not present

## 2024-07-22 DIAGNOSIS — Z7985 Long-term (current) use of injectable non-insulin antidiabetic drugs: Secondary | ICD-10-CM

## 2024-07-22 DIAGNOSIS — F5089 Other specified eating disorder: Secondary | ICD-10-CM | POA: Diagnosis not present

## 2024-07-22 DIAGNOSIS — E66813 Obesity, class 3: Secondary | ICD-10-CM

## 2024-07-22 DIAGNOSIS — E1169 Type 2 diabetes mellitus with other specified complication: Secondary | ICD-10-CM | POA: Diagnosis not present

## 2024-07-22 DIAGNOSIS — E559 Vitamin D deficiency, unspecified: Secondary | ICD-10-CM

## 2024-07-22 DIAGNOSIS — E669 Obesity, unspecified: Secondary | ICD-10-CM

## 2024-07-22 DIAGNOSIS — Z6841 Body Mass Index (BMI) 40.0 and over, adult: Secondary | ICD-10-CM | POA: Diagnosis not present

## 2024-07-22 DIAGNOSIS — F3289 Other specified depressive episodes: Secondary | ICD-10-CM

## 2024-07-22 DIAGNOSIS — E1165 Type 2 diabetes mellitus with hyperglycemia: Secondary | ICD-10-CM

## 2024-07-22 DIAGNOSIS — F32A Depression, unspecified: Secondary | ICD-10-CM | POA: Diagnosis not present

## 2024-07-22 MED ORDER — TOPIRAMATE 50 MG PO TABS: 50.0000 mg | ORAL_TABLET | Freq: Every day | ORAL | 0 refills | Status: AC

## 2024-07-22 MED ORDER — BUPROPION HCL ER (SR) 150 MG PO TB12: 150.0000 mg | ORAL_TABLET | Freq: Every day | ORAL | 0 refills | Status: DC

## 2024-07-22 MED ORDER — VITAMIN D (ERGOCALCIFEROL) 1.25 MG (50000 UNIT) PO CAPS: 50000.0000 [IU] | ORAL_CAPSULE | ORAL | 0 refills | Status: AC

## 2024-07-22 MED ORDER — OZEMPIC (2 MG/DOSE) 8 MG/3ML ~~LOC~~ SOPN: 2.0000 mg | PEN_INJECTOR | SUBCUTANEOUS | 3 refills | Status: AC

## 2024-07-22 NOTE — Progress Notes (Signed)
 Office: [phone]  /  Fax: [phone]  WEIGHT SUMMARY AND BIOMETRICS  Anthropometric Measurements Height: 5' 2 (1.575 m) Weight: 247 lb (112 kg) BMI (Calculated): 45.17 Weight at Last Visit: 247 lb Weight Lost Since Last Visit: 0 Weight Gained Since Last Visit: 0 Starting Weight: 267 lb Total Weight Loss (lbs): 20 lb (9.072 kg) Peak Weight: 275 lb   Body Composition  Body Fat %: 49.7 % Fat Mass (lbs): 123.2 lbs Muscle Mass (lbs): 118.4 lbs Total Body Water (lbs): 83.6 lbs Visceral Fat Rating : 17   Other Clinical Data Fasting: yes Labs: no Today's Visit #: 24 Starting Date: 03/16/22    Chief Complaint: OBESITY   Discussed the use of AI scribe software for clinical note transcription with the patient, who gave verbal consent to proceed.  History of Present Illness [name] [name] is a 53 year old female with obesity and type 2 diabetes who presents for obesity treatment and progress assessment.  She is adhering to a category three eating plan approximately ninety percent of the time, focusing on increasing her intake of fruits, vegetables, and protein. She is also working on maintaining adequate hydration and avoiding skipping meals. Despite these efforts, her weight has remained stable over the last two months since her last visit. She generally sleeps seven to nine hours per night and engages in walking for exercise for thirty minutes two days a week.  She has type 2 diabetes and is currently treated with Ozempic  at a dose of two milligrams weekly. Her blood sugars have been stable, although she occasionally experiences low readings without associated symptoms. She recently underwent a DOT physical where her blood sugar levels were reported to be at a good level.  She also has hyperlipidemia and is being treated with Crestor  at a dose of twenty milligrams. She is following a low cholesterol diet. Additionally, she has been on a B12-rich diet due to  previously low B12 levels.      PHYSICAL EXAM:  Blood pressure (!) 87/60, pulse 90, temperature 98.2 F (36.8 C), height 5' 2 (1.575 m), weight 247 lb (112 kg), SpO2 98%. Body mass index is 45.18 kg/m.  DIAGNOSTIC DATA REVIEWED:  BMET    Component Value Date/Time   NA 141 05/15/2024 0920   K 4.3 05/15/2024 0920   CL 105 05/15/2024 0920   CO2 21 05/15/2024 0920   GLUCOSE 99 05/15/2024 0920   GLUCOSE 126 (H) 03/05/2020 0924   BUN 9 05/15/2024 0920   CREATININE 0.79 05/15/2024 0920   CALCIUM  9.4 05/15/2024 0920   GFRNONAA >60 03/05/2020 0924   GFRAA >60 03/05/2020 0924   Lab Results  Component Value Date   HGBA1C 5.8 (H) 05/15/2024   HGBA1C 5.8 09/07/2014   Lab Results  Component Value Date   INSULIN  28.7 (H) 05/15/2024   INSULIN  20.0 03/20/2022   Lab Results  Component Value Date   TSH 1.260 03/20/2023   CBC    Component Value Date/Time   WBC 6.9 03/20/2023 1153   WBC 6.9 03/05/2020 0924   RBC 4.94 03/20/2023 1153   RBC 4.89 03/05/2020 0924   HGB 13.1 03/20/2023 1153   HCT 41.4 03/20/2023 1153   PLT 394 03/20/2023 1153   MCV 84 03/20/2023 1153   MCH 26.5 (L) 03/20/2023 1153   MCH 25.8 (L) 03/05/2020 0924   MCHC 31.6 03/20/2023 1153   MCHC 30.6 03/05/2020 0924   RDW 14.4 03/20/2023 1153   Iron Studies    Component  Value Date/Time   IRON 36 06/27/2016 1457   TIBC 294 06/27/2016 1457   FERRITIN 140 12/09/2020 1421   IRONPCTSAT 12 (L) 06/27/2016 1457   Lipid Panel     Component Value Date/Time   CHOL 125 12/06/2023 1011   TRIG 48 12/06/2023 1011   HDL 59 12/06/2023 1011   CHOLHDL 2.9 08/03/2021 1031   LDLCALC 55 12/06/2023 1011   Hepatic Function Panel     Component Value Date/Time   PROT 7.2 05/15/2024 0920   ALBUMIN 3.9 05/15/2024 0920   AST 15 05/15/2024 0920   ALT 9 05/15/2024 0920   ALKPHOS 81 05/15/2024 0920   BILITOT 0.4 05/15/2024 0920      Component Value Date/Time   TSH 1.260 03/20/2023 1153   Nutritional Lab Results   Component Value Date   VD25OH 36.8 05/15/2024   VD25OH 24.1 (L) 12/06/2023   VD25OH 30.3 08/02/2023     Assessment and Plan Assessment & Plan Obesity Management is ongoing with a focus on dietary changes and exercise. She is following the category three eating plan 90% of the time, increasing fruits and vegetables, and meeting protein recommendations. She is hydrating adequately and not skipping meals. She is walking for 30 minutes two days a week. Weight has been maintained over the last two months. Challenges with soda consumption are being addressed by switching to lighter options. Topiramate is considered to aid in reducing soda appeal and promoting weight loss. Discussed potential side effects of topiramate, including taste disturbances and weight loss, with no increased risk of kidney stones due to lack of history. No interactions with current medications. Starting dose is 50 mg, with instructions to halve for the first week and adjust based on efficacy. - Started topiramate 50 mg, instructed to halve for the first week and adjust based on efficacy. - Continue category three eating plan. - Encouraged increased intake of fruits and vegetables. - Continue walking for 30 minutes two days a week.  Type 2 diabetes mellitus Managed with Ozempic  2 mg weekly. Blood sugars are generally well-controlled, though occasional low readings are noted. No significant symptoms of hypoglycemia reported. Recent DOT physical indicated good blood sugar levels. - Continue Ozempic  2 mg weekly. - Continue diet, exercise and weight loss as discussed today as an important part of the treatment plan - Order labs  Hyperlipidemia Managed with Crestor  20 mg. She is following a low cholesterol diet and working on weight loss. Cholesterol levels will be monitored with upcoming labs. - Continue Crestor  20 mg daily. - Ordered cholesterol labs.  Vitamin D  deficiency Managed with supplementation. Vitamin D  levels  will be monitored with upcoming labs. - Continue vitamin D  supplementation. - Ordered vitamin D  level.  Depression with emotional eating behaviors Managed with Wellbutrin . No new symptoms reported. - Continue Wellbutrin . - Work on identifying and avoiding EEB - Refilled Wellbutrin  prescription.   Zamani was counseled on the importance of maintaining healthy lifestyle habits, including balanced nutrition, regular physical activity, and behavioral modifications, while taking antiobesity medication.  Patient verbalized understanding that medication is an adjunct to, not a replacement for, lifestyle changes and that the long-term success and weight maintenance depend on continued adherence to these strategies.   Loriann was informed of the importance of frequent follow up visits to maximize her success with intensive lifestyle modifications for her obesity and obesity related health conditions as recommended by USPSTF and CMS guidelines   Louann Penton, MD

## 2024-07-23 LAB — VITAMIN B12: Vitamin B-12: 267 pg/mL (ref 232–1245)

## 2024-07-23 LAB — LIPID PANEL WITH LDL/HDL RATIO
Cholesterol, Total: 166 mg/dL (ref 100–199)
HDL: 59 mg/dL (ref >39)
LDL Chol Calc (NIH): 95 mg/dL (ref 0–99)
LDL/HDL Ratio: 1.6 ratio (ref 0.0–3.2)
Triglycerides: 58 mg/dL (ref 0–149)
VLDL Cholesterol Cal: 12 mg/dL (ref 5–40)

## 2024-07-23 LAB — VITAMIN D 25 HYDROXY (VIT D DEFICIENCY, FRACTURES): Vit D, 25-Hydroxy: 33.6 ng/mL (ref 30.0–100.0)

## 2024-07-23 LAB — HEMOGLOBIN A1C
Est. average glucose Bld gHb Est-mCnc: 117 mg/dL
Hgb A1c MFr Bld: 5.7 % — ABNORMAL HIGH (ref 4.8–5.6)

## 2024-07-23 LAB — INSULIN, RANDOM: INSULIN: 24.1 u[IU]/mL (ref 2.6–24.9)

## 2024-08-06 ENCOUNTER — Emergency Department (HOSPITAL_COMMUNITY)

## 2024-08-06 ENCOUNTER — Emergency Department (HOSPITAL_COMMUNITY): Admission: EM | Admit: 2024-08-06 | Discharge: 2024-08-06 | Disposition: A | Discharge status: Home / Self Care

## 2024-08-06 ENCOUNTER — Other Ambulatory Visit: Payer: Self-pay

## 2024-08-06 ENCOUNTER — Encounter (HOSPITAL_COMMUNITY): Payer: Self-pay

## 2024-08-06 DIAGNOSIS — J45909 Unspecified asthma, uncomplicated: Secondary | ICD-10-CM | POA: Insufficient documentation

## 2024-08-06 DIAGNOSIS — I10 Essential (primary) hypertension: Secondary | ICD-10-CM | POA: Diagnosis not present

## 2024-08-06 DIAGNOSIS — R1013 Epigastric pain: Secondary | ICD-10-CM | POA: Insufficient documentation

## 2024-08-06 DIAGNOSIS — R059 Cough, unspecified: Secondary | ICD-10-CM | POA: Diagnosis not present

## 2024-08-06 DIAGNOSIS — R0981 Nasal congestion: Secondary | ICD-10-CM | POA: Insufficient documentation

## 2024-08-06 DIAGNOSIS — R1084 Generalized abdominal pain: Secondary | ICD-10-CM | POA: Insufficient documentation

## 2024-08-06 DIAGNOSIS — E119 Type 2 diabetes mellitus without complications: Secondary | ICD-10-CM | POA: Diagnosis not present

## 2024-08-06 DIAGNOSIS — R112 Nausea with vomiting, unspecified: Secondary | ICD-10-CM | POA: Insufficient documentation

## 2024-08-06 DIAGNOSIS — R519 Headache, unspecified: Secondary | ICD-10-CM | POA: Insufficient documentation

## 2024-08-06 DIAGNOSIS — R197 Diarrhea, unspecified: Secondary | ICD-10-CM | POA: Insufficient documentation

## 2024-08-06 DIAGNOSIS — R109 Unspecified abdominal pain: Secondary | ICD-10-CM | POA: Diagnosis not present

## 2024-08-06 DIAGNOSIS — Z419 Encounter for procedure for purposes other than remedying health state, unspecified: Secondary | ICD-10-CM | POA: Diagnosis not present

## 2024-08-06 LAB — COMPREHENSIVE METABOLIC PANEL WITH GFR
ALT: 12 U/L (ref 0–44)
AST: 13 U/L — ABNORMAL LOW (ref 15–41)
Albumin: 3.3 g/dL — ABNORMAL LOW (ref 3.5–5.0)
Alkaline Phosphatase: 70 U/L (ref 38–126)
Anion gap: 13 (ref 5–15)
BUN: 5 mg/dL — ABNORMAL LOW (ref 6–20)
CO2: 20 mmol/L — ABNORMAL LOW (ref 22–32)
Calcium: 9 mg/dL (ref 8.9–10.3)
Chloride: 107 mmol/L (ref 98–111)
Creatinine, Ser: 0.86 mg/dL (ref 0.44–1.00)
GFR, Estimated: >60 mL/min (ref >60)
Glucose, Bld: 102 mg/dL — ABNORMAL HIGH (ref 70–99)
Potassium: 3.5 mmol/L (ref 3.5–5.1)
Sodium: 140 mmol/L (ref 135–145)
Total Bilirubin: 0.6 mg/dL (ref 0.0–1.2)
Total Protein: 7.1 g/dL (ref 6.5–8.1)

## 2024-08-06 LAB — URINALYSIS, ROUTINE W REFLEX MICROSCOPIC
Bacteria, UA: NONE SEEN
Bilirubin Urine: NEGATIVE
Glucose, UA: NEGATIVE mg/dL
Ketones, ur: NEGATIVE mg/dL
Leukocytes,Ua: NEGATIVE
Nitrite: NEGATIVE
Protein, ur: NEGATIVE mg/dL
Specific Gravity, Urine: 1.021 (ref 1.005–1.030)
pH: 5 (ref 5.0–8.0)

## 2024-08-06 LAB — CBC
HCT: 39.7 % (ref 36.0–46.0)
Hemoglobin: 12.3 g/dL (ref 12.0–15.0)
MCH: 26.3 pg (ref 26.0–34.0)
MCHC: 31 g/dL (ref 30.0–36.0)
MCV: 84.8 fL (ref 80.0–100.0)
Platelets: 440 K/uL — ABNORMAL HIGH (ref 150–400)
RBC: 4.68 MIL/uL (ref 3.87–5.11)
RDW: 15.6 % — ABNORMAL HIGH (ref 11.5–15.5)
WBC: 8.4 K/uL (ref 4.0–10.5)
nRBC: 0 % (ref 0.0–0.2)

## 2024-08-06 LAB — RESP PANEL BY RT-PCR (RSV, FLU A&B, COVID)  RVPGX2
Influenza A by PCR: NEGATIVE
Influenza B by PCR: NEGATIVE
Resp Syncytial Virus by PCR: NEGATIVE
SARS Coronavirus 2 by RT PCR: NEGATIVE

## 2024-08-06 LAB — LIPASE, BLOOD: Lipase: 64 U/L — ABNORMAL HIGH (ref 11–51)

## 2024-08-06 MED ORDER — ONDANSETRON HCL 4 MG PO TABS: 4.0000 mg | ORAL_TABLET | Freq: Four times a day (QID) | ORAL | 0 refills | Status: AC

## 2024-08-06 MED ORDER — LACTATED RINGERS IV BOLUS
1000.0000 mL | Freq: Once | INTRAVENOUS | Status: AC
  Administered 2024-08-06: 1000 mL via INTRAVENOUS

## 2024-08-06 MED ORDER — ONDANSETRON HCL 4 MG/2ML IJ SOLN
4.0000 mg | Freq: Once | INTRAMUSCULAR | Status: AC
  Administered 2024-08-06: 4 mg via INTRAVENOUS
  Filled 2024-08-06: qty 2

## 2024-08-06 MED ORDER — IOHEXOL 350 MG/ML SOLN
75.0000 mL | Freq: Once | INTRAVENOUS | Status: AC | PRN
  Administered 2024-08-06: 75 mL via INTRAVENOUS

## 2024-08-06 NOTE — ED Triage Notes (Signed)
 Pt. Stated, [name] [name] had bad stomach pain with N/V/D since Sunday.

## 2024-08-06 NOTE — ED Provider Notes (Signed)
 [place] EMERGENCY DEPARTMENT AT [facility] Provider Note   CSN: [id number] Arrival date & time: 08/06/24  9252     Patient presents with: Abdominal Pain, Emesis, Nausea, and Diarrhea   [name] [name] is a 53 y.o. female.   Abdominal Pain Associated symptoms: diarrhea and vomiting   Emesis Associated symptoms: abdominal pain and diarrhea   Diarrhea Associated symptoms: abdominal pain and vomiting    Patient is a 53 year old female presenting ED today for concerns for generalized abdominal pain, nausea, vomiting, diarrhea x 3 days.  Reported that she has not been able to tolerate anything p.o. since symptoms began.  Has been taking Pepto-Bismol with no relief.  Reports that she had family member who previously had similar symptoms.  Previous medical history of 2 diabetes, HTN, migraines, renal insufficiency, asthma, anemia.  Endorses headache however is not as bad as previous migraines and is noted to be described as pulsatile, accompanied with light and sound sensitivity.  Additionally endorses persistent nausea, cough, congestion.  Denies fever, blurry vision, vertigo, chest pain, shortness of breath, hematemesis, dysuria, urinary frequency, vaginal discharge, vaginal bleeding, lower leg swelling, rashes.  Prior to Admission medications   Medication Sig Start Date End Date Taking? Authorizing Provider  ondansetron  (ZOFRAN ) 4 MG tablet Take 1 tablet (4 mg total) by mouth every 6 (six) hours. 08/06/24  Yes Jerrelle Michelsen S, PA-C  albuterol  (VENTOLIN  HFA) 108 (90 Base) MCG/ACT inhaler Inhale 2 puffs into the lungs every 6 (six) hours as needed for wheezing or shortness of breath. 02/21/23   [place] Adelita PENNER, MD  benzonatate  (TESSALON  PERLES) 100 MG capsule Take 1 capsule (100 mg total) by mouth every 6 (six) hours as needed for cough. 07/06/23   Jolaine Pac, DO  buPROPion  (WELLBUTRIN  SR) 150 MG 12 hr tablet Take 1 tablet (150 mg total) by mouth daily. 07/22/24    Verdon Parry D, MD  cetirizine  (ZYRTEC  ALLERGY) 10 MG tablet Take 1 tablet (10 mg total) by mouth daily. 07/06/23 07/22/24  Jolaine Pac, DO  Cholecalciferol  20 MCG (800 UNIT) TABS Take 20 mcg by mouth daily. 11/01/23   Masters, Katie, DO  gabapentin  (NEURONTIN ) 300 MG capsule Take 1 capsule (300 mg total) by mouth at bedtime. 09/04/22 07/02/25  Elnora Ip, MD  rosuvastatin  (CRESTOR ) 20 MG tablet Take 1 tablet (20 mg total) by mouth daily. 10/22/23   Gabino Boga, MD  Semaglutide , 2 MG/DOSE, (OZEMPIC , 2 MG/DOSE,) 8 MG/3ML SOPN Inject 2 mg into the skin once a week. 07/22/24   Verdon Parry D, MD  topiramate (TOPAMAX) 50 MG tablet Take 1 tablet (50 mg total) by mouth daily. 07/22/24   Verdon Parry D, MD  Vitamin D , Ergocalciferol , (DRISDOL ) 1.25 MG (50000 UNIT) CAPS capsule Take 1 capsule (50,000 Units total) by mouth every 7 (seven) days. 07/22/24   Verdon Parry D, MD    Allergies: Cat dander, Clorox nasal antiseptic [povidone-iodine ], and Mushroom extract complex (obsolete)    Review of Systems  Gastrointestinal:  Positive for abdominal pain, diarrhea and vomiting.  All other systems reviewed and are negative.   Updated Vital Signs BP 132/87   Pulse 75   Temp 97.9 F (36.6 C) (Oral)   Resp 16   Ht 5' 2 (1.575 m)   Wt 113.4 kg   LMP  (LMP Unknown) Comment: implant removed no periods yet  SpO2 98%   BMI 45.73 kg/m   Physical Exam Vitals and nursing note reviewed.  Constitutional:      General:  She is not in acute distress.    Appearance: Normal appearance. She is not ill-appearing or diaphoretic.  HENT:     Head: Normocephalic and atraumatic.  Eyes:     General: No scleral icterus.       Right eye: No discharge.        Left eye: No discharge.     Extraocular Movements: Extraocular movements intact.     Conjunctiva/sclera: Conjunctivae normal.  Cardiovascular:     Rate and Rhythm: Normal rate and regular rhythm.     Pulses: Normal pulses.     Heart  sounds: Normal heart sounds. No murmur heard.    No friction rub. No gallop.  Pulmonary:     Effort: Pulmonary effort is normal. No respiratory distress.     Breath sounds: No stridor. No wheezing, rhonchi or rales.  Chest:     Chest wall: No tenderness.  Abdominal:     General: Abdomen is flat. There is no distension.     Palpations: Abdomen is soft.     Tenderness: There is generalized abdominal tenderness and tenderness in the epigastric area. There is no right CVA tenderness, left CVA tenderness, guarding or rebound.  Musculoskeletal:        General: No swelling, deformity or signs of injury.     Cervical back: Normal range of motion. No rigidity.     Right lower leg: No edema.     Left lower leg: No edema.  Skin:    General: Skin is warm and dry.     Findings: No bruising, erythema or lesion.  Neurological:     General: No focal deficit present.     Mental Status: She is alert and oriented to person, place, and time. Mental status is at baseline.     Sensory: No sensory deficit.     Motor: No weakness.  Psychiatric:        Mood and Affect: Mood normal.     (all labs ordered are listed, but only abnormal results are displayed) Labs Reviewed  LIPASE, BLOOD - Abnormal; Notable for the following components:      Result Value   Lipase 64 (*)    All other components within normal limits  COMPREHENSIVE METABOLIC PANEL WITH GFR - Abnormal; Notable for the following components:   CO2 20 (*)    Glucose, Bld 102 (*)    BUN 5 (*)    Albumin 3.3 (*)    AST 13 (*)    All other components within normal limits  CBC - Abnormal; Notable for the following components:   RDW 15.6 (*)    Platelets 440 (*)    All other components within normal limits  URINALYSIS, ROUTINE W REFLEX MICROSCOPIC - Abnormal; Notable for the following components:   Hgb urine dipstick SMALL (*)    All other components within normal limits  RESP PANEL BY RT-PCR (RSV, FLU A&B, COVID)  [id number]     EKG: None  Radiology: CT ABDOMEN PELVIS W CONTRAST Result Date: 08/06/2024 CLINICAL DATA:  Acute abdominal pain. Nausea, vomiting and diarrhea 3 days. EXAM: CT ABDOMEN AND PELVIS WITH CONTRAST TECHNIQUE: Multidetector CT imaging of the abdomen and pelvis was performed using the standard protocol following bolus administration of intravenous contrast. RADIATION DOSE REDUCTION: This exam was performed according to the departmental dose-optimization program which includes automated exposure control, adjustment of the mA and/or kV according to patient size and/or use of iterative reconstruction technique. CONTRAST:  75mL OMNIPAQUE  IOHEXOL  350 MG/ML SOLN  COMPARISON:  02/13/2023 FINDINGS: Lower chest: Heart is normal size.  Visualized lung bases are clear. Hepatobiliary: Previous cholecystectomy. Liver and biliary tree are unremarkable. Pancreas: Normal. Spleen: Normal. Adrenals/Urinary Tract: Adrenal glands are normal. Kidneys are normal in size without hydronephrosis or nephrolithiasis. Ureters and bladder are normal. Stomach/Bowel: Stomach and small bowel are normal. Appendix is normal. Colon is normal. Vascular/Lymphatic: Abdominal aorta is normal. Remaining vascular structures are unremarkable. No adenopathy. Reproductive: Uterus and bilateral adnexa are unremarkable. Other: Tiny amount of free fluid in the cul-de-sac likely physiologic. No focal inflammatory change or free peritoneal air. Musculoskeletal: No focal abnormality. IMPRESSION: 1. No acute findings in the abdomen/pelvis. 2. Previous cholecystectomy. Electronically Signed   By: Toribio Agreste M.D.   On: 08/06/2024 10:53    Procedures   Medications Ordered in the ED  ondansetron  (ZOFRAN ) injection 4 mg (4 mg Intravenous Given 08/06/24 1003)  lactated ringers  bolus 1,000 mL (0 mLs Intravenous Stopped 08/06/24 1231)  iohexol  (OMNIPAQUE ) 350 MG/ML injection 75 mL (75 mLs Intravenous Contrast Given 08/06/24 1031)    Medical Decision  Making Amount and/or Complexity of Data Reviewed Labs: ordered. Radiology: ordered.  Risk Prescription drug management.   This patient is a 53 year old female who presents to the ED for concern of, ongoing diarrhea x 3 days, with family member showing similar symptoms.  Had been unable to tolerate p.o. x 3 days.  On physical exam, patient is in no acute distress, afebrile, alert and orient x 4, speaking in full sentences, nontachypneic, nontachycardic.  Notably does have some mild generalized abdominal tenderness, localizing to epigastric region.  Exam is otherwise unremarkable.  With patient's presentation, provided fluids as well as nausea medication and CT scan.  Lab work and imaging were unremarkable.  On reevaluation, patient noted that she is feeling symptomatically better having passed p.o. challenge.  Will send home with Zofran  and have her continue to follow-up with PCP as needed.  Suspecting viral etiology.  Patient vital signs have remained stable throughout the course of patient's time in the ED. Low suspicion for any other emergent pathology at this time. I believe this patient is safe to be discharged. Provided strict return to ER precautions. Patient expressed agreement and understanding of plan. All questions were answered.  Differential diagnoses prior to evaluation: The emergent differential diagnosis includes, but is not limited to,  PUD, gastritis, pancreatitis, gastroparesis, malignancy,  ACS, pericarditis, pneumonia, intestinal ischemia, esophageal rupture, hepatitis, Mesenteric ischemia, diverticulitis, nephrolithiasis, constipation, bowel obstruction, IBD, ovarian torsion, PID . This is not an exhaustive differential.   Past Medical History / Co-morbidities / Social History: 2 diabetes, HTN, migraines, renal insufficiency, asthma, anemia  Additional history: Chart reviewed. Pertinent results include:   Last seen by pain medicine on 07/22/2024, noted to be taking  Ozempic , 2 mg weekly.  Noted to be hypertensive at that time.  Started on topiramate 50 mg  Lab Tests/Imaging studies: I personally interpreted labs/imaging and the pertinent results include: CBC notes an elevated platelet of 441 but otherwise unremarkable CMP unremarkable Lipase elevated 64 secondary to nausea and vomiting UA notes small hemoglobin but otherwise unremarkable CT scan does not show any acute findings.  I agree with the radiologist interpretation.  Medications: I ordered medication including Zofran , LR.  I have reviewed the patients home medicines and have made adjustments as needed.  Critical Interventions: None  Social Determinants of Health: None  Disposition: After consideration of the diagnostic results and the patients response to treatment, I feel that the patient would  benefit from discharge as above.   emergency department workup does not suggest an emergent condition requiring admission or immediate intervention beyond what has been performed at this time. The plan is: Follow-up with PCP, Zofran  for nausea as needed, return to the ER for new or worsening symptoms. The patient is safe for discharge and has been instructed to return immediately for worsening symptoms, change in symptoms or any other concerns.   Final diagnoses:  Nausea vomiting and diarrhea    ED Discharge Orders          Ordered    ondansetron  (ZOFRAN ) 4 MG tablet  Every 6 hours        08/06/24 1234               Beola Terrall RAMAN, PA-C 08/06/24 1237    Ula Prentice SAUNDERS, MD 08/06/24 [phone]

## 2024-08-06 NOTE — Discharge Instructions (Addendum)
 You are seen today for nausea, vomiting, diarrhea, your lab work and imaging today were very reassuring that low suspicion for any emergent cause recent today.  Recommend continue to take the Zofran  for nausea as needed.  Additionally you can take Tylenol  for stomach pain.  Follow-up with PCP for any persistent symptoms.  Return to the ER if the pain worsens after include persistent vomiting, uncontrolled pain, fever, blood in urine or stool.

## 2024-08-19 NOTE — Progress Notes (Unsigned)
 TeleHealth Visit:  This visit was completed with telemedicine (audio/video) technology. [name] [name] has verbally consented to this TeleHealth visit. The patient is located at home, the provider is located at the [facility] office in [place]. The participants in this visit include the listed provider and patient. The visit was conducted today via MyChart video.     SUBJECTIVE: Discussed the use of AI scribe software for clinical note transcription with the patient, who gave verbal consent to proceed.  Chief Complaint: Obesity  Interim History: She is being seen by video visit today due to having a URI.  [name] [name] is here to discuss her progress with her obesity treatment plan. She is on the Category 3 Plan and states she is following her eating plan approximately 90 % of the time. She states she is exercising walking 30 minutes 2 times per week.  [name] [name] is a 53 year old female with obesity who presents for follow-up of her obesity treatment plan.  She is recovering from a recent stomach virus that required an emergency room visit last week, during which she was unable to eat or drink, including water. She is now gradually returning to her normal diet.  Regarding her obesity treatment, she is on Ozempic , taking 2 mg once a week. She has not missed any doses despite her recent illness and reports no side effects such as nausea, vomiting, constipation, or diarrhea. She feels that the medication, along with her meal plan, has been effective in managing her eating habits, reducing cravings, and helping her adhere to her dietary regimen.  She has a history of type 2 diabetes but is not regularly checking her blood sugars at home. She is not currently monitoring her blood sugars frequently at home.  She is also on vitamin D  supplementation once a week and reports no issues with it. Additionally, she is taking bupropion  (Wellbutrin ) to help with cravings and emotional eating, which  she finds beneficial. She has no current need for refills on her medications as they have been recently filled.  She reports experiencing a mild respiratory illness, describing it as 'a little cold' with associated chills but no significant fever. OBJECTIVE: Visit Diagnoses: Problem List Items Addressed This Visit     Type 2 diabetes mellitus with hyperglycemia, without long-term current use of insulin  (HCC) - Primary   BMI 45.0-49.9, adult (HCC)   Vitamin D  deficiency (Chronic)   Other Visit Diagnoses       Viral upper respiratory tract infection         Other depression, with emotional eating behavior         Obesity, starting BMI 48.83         Obesity with emotional eating behavior Obesity management is ongoing with Ozempic  and bupropion . She reports adherence to Ozempic  despite recent illness and no adverse effects such as nausea, vomiting, constipation, or diarrhea. Bupropion  is effective in managing cravings. She is following a meal plan and reports improved eating habits. Reports no SE with bupropion .  No SE with Ozempic -Denies mass in neck, dysphagia, dyspepsia, persistent hoarseness, abdominal pain, or N/V/Constipation or diarrhea. Has annual eye exam. Mood is stable.   Discussed strategies for managing Thanksgiving to prevent overeating, including portion control and mindful eating. - Continue Ozempic  2 mg once weekly for primary indication of T2DM. - Continue bupropion  SR 150 mg daily as prescribed for emotional eating behaviors. - Scheduled follow-up appointment on December 31st at 11:00  AM for in-person visit to weigh in and assess progress.  Type 2 diabetes mellitus Type 2 diabetes is managed with Ozempic  2mg  weekly. She is not regularly checking blood sugars at home . No recent issues with blood sugar levels reported.Denies mass in neck, dysphagia, dyspepsia, persistent hoarseness, abdominal pain, or N/V/Constipation or diarrhea. Has annual eye exam. Mood is stable.  Lab  Results  Component Value Date   HGBA1C 5.7 (H) 07/22/2024   HGBA1C 5.8 (H) 05/15/2024   HGBA1C 5.7 (H) 12/06/2023   Lab Results  Component Value Date   LDLCALC 95 07/22/2024   CREATININE 0.86 08/06/2024   INSULIN   Date Value Ref Range Status  07/22/2024 24.1 2.6 - 24.9 uIU/mL Final  ]She is working  on nutrition plan to decrease simple carbohydrates, increase lean proteins and exercise to promote weight loss and improve glycemic control . Recent gastroenteritis and URI, but back on track with eating plan - Continue current diabetes management with Ozempic  2 mg weekly. No refill needed this visit.  - Monitor blood sugar levels, A1c and insulin  levels periodically as needed.  Vitamin D  deficiency Managed with weekly supplementation Ergocalciferol  50,000 units once weekly. No issues reported with current regimen. - Continue weekly vitamin D  supplementation.  Acute upper respiratory infection Reports mild symptoms consistent with a cold, including chills and mild fever. No severe symptoms reported. Monitor fever, symptoms and hydrate well. Advised to seek care if worsening.   Recent gastroenteritis Recent episode of acute gastroenteritis with severe symptoms including inability to eat or drink. Symptoms have improved, and she is returning to normal eating habits. Advised to seek care if any recurrent symptoms or concerns.   No data recorded Anthropometric Measurements Height: 5' 2 (1.575 m) Weight at Last Visit: 247 lb Starting Weight: 267 lb Peak Weight: 275 lb   No data recorded Other Clinical Data Fasting: N/A Labs: N/A Today's Visit #: 25 Starting Date: 03/16/22     ASSESSMENT AND PLAN:  Diet: [name] [name] is currently in the action stage of change. As such, her goal is to get back to weight loss efforts. She has agreed to Category 3 Plan.  Exercise: [name] [name] has been instructed to work up to a goal of 150 minutes of combined cardio and strengthening exercise per  week for weight loss and overall health benefits.   Behavior Modification:  We discussed the following Behavioral Modification Strategies today: increasing lean protein intake, decreasing simple carbohydrates, increasing vegetables, increase H2O intake, increase high fiber foods, meal planning and cooking strategies, emotional eating strategies , holiday eating strategies, avoiding temptations, and planning for success. We discussed various medication options to help [name] [name] with her weight loss efforts and we both agreed to continue Ozempic  2 mg weekly for T2DM and bupropion  SR 150 mg daily for emotional eating/cravings .  Return in about 5 weeks (around 09/24/2024).[name] [name] She was informed of the importance of frequent follow up visits to maximize her success with intensive lifestyle modifications for her multiple health conditions.  Attestation Statements:   Reviewed by clinician on day of visit: allergies, medications, problem list, medical history, surgical history, family history, social history, and previous encounter notes.   Time spent on visit including pre-visit chart review and post-visit care and charting was 20 minutes.    Amarius Toto, PA-C

## 2024-08-20 ENCOUNTER — Telehealth (INDEPENDENT_AMBULATORY_CARE_PROVIDER_SITE_OTHER): Payer: Self-pay | Admitting: Physician Assistant

## 2024-08-20 ENCOUNTER — Encounter (INDEPENDENT_AMBULATORY_CARE_PROVIDER_SITE_OTHER): Payer: Self-pay | Admitting: Physician Assistant

## 2024-08-20 VITALS — Ht 62.0 in

## 2024-08-20 DIAGNOSIS — E119 Type 2 diabetes mellitus without complications: Secondary | ICD-10-CM | POA: Diagnosis not present

## 2024-08-20 DIAGNOSIS — Z6841 Body Mass Index (BMI) 40.0 and over, adult: Secondary | ICD-10-CM

## 2024-08-20 DIAGNOSIS — E1165 Type 2 diabetes mellitus with hyperglycemia: Secondary | ICD-10-CM

## 2024-08-20 DIAGNOSIS — E669 Obesity, unspecified: Secondary | ICD-10-CM

## 2024-08-20 DIAGNOSIS — Z7985 Long-term (current) use of injectable non-insulin antidiabetic drugs: Secondary | ICD-10-CM

## 2024-08-20 DIAGNOSIS — J069 Acute upper respiratory infection, unspecified: Secondary | ICD-10-CM | POA: Diagnosis not present

## 2024-08-20 DIAGNOSIS — E1169 Type 2 diabetes mellitus with other specified complication: Secondary | ICD-10-CM

## 2024-08-20 DIAGNOSIS — F3289 Other specified depressive episodes: Secondary | ICD-10-CM

## 2024-08-20 DIAGNOSIS — E559 Vitamin D deficiency, unspecified: Secondary | ICD-10-CM | POA: Diagnosis not present

## 2024-08-20 DIAGNOSIS — E538 Deficiency of other specified B group vitamins: Secondary | ICD-10-CM

## 2024-09-22 NOTE — Progress Notes (Unsigned)
 "  SUBJECTIVE: Discussed the use of AI scribe software for clinical note transcription with the patient, who gave verbal consent to proceed.  Chief Complaint: Obesity  Interim History: She is  up 1 lb since her last visit.  Down 19 lbs overall TBW loss of 7.1%  [name] [name] is here to discuss her progress with her obesity treatment plan. She is on the Category 3 Plan and states she is following her eating plan approximately 0 % of the time. She states she is not exercising 0 minutes 0 times per week.  [name] [name] is a 53 year old female who presents for follow-up of her obesity treatment plan.  She has gained one pound over the holidays, attributing it to not following her category three plan. Despite this, she has been eating more whole foods and protein, not skipping meals, and sleeping at least seven hours per night. However, she is not drinking adequate water and is not exercising regularly. She has reduced her soft drink intake from three two-liter bottles a day to two cans a day, specifically Dr. Nunzio.  She has a history of type 2 diabetes and is currently on Ozempic  2 mg once weekly with no side effects such as nausea, vomiting, constipation, diarrhea, difficulty swallowing, or changes in vision or mood. She does not regularly check her blood sugars, but her last A1c in October was 5.7%.  For emotional eating, she is on bupropion  SR 150 mg daily and topiramate  50 mg daily. She reports that bupropion  helps with cravings and energy, but she dislikes topiramate  as it makes everything taste unpleasant, particularly soft drinks. She has stopped taking topiramate .  She is on rosuvastatin  20 mg daily for hyperlipidemia and reports no issues with this medication.  She is also on ergocalciferol  50,000 units once weekly for vitamin D  deficiency and reports no side effects such as nausea. OBJECTIVE: Visit Diagnoses: Problem List Items Addressed This Visit     Type 2 diabetes mellitus  with hyperglycemia, without long-term current use of insulin  (HCC) - Primary   Hyperlipidemia associated with type 2 diabetes mellitus (HCC)   BMI 45.0-49.9, adult (HCC)   Vitamin D  deficiency (Chronic)   Other Visit Diagnoses       Other depression, with emotional eating behavior       Relevant Medications   buPROPion  (WELLBUTRIN  SR) 150 MG 12 hr tablet     Obesity, starting BMI 48.83         Obesity Recent weight gain of 1 pound over the holidays and celebration eating for Birthday month.  Overall weight loss of 19 pounds with increased muscle mass and decreased adipose tissue since last visit. Challenges with soft drink consumption and calorie counting. Emotional eating managed with bupropion  and topiramate , though topiramate  was discontinued due to taste alteration. Bupropion  is effective in managing cravings and providing energy. - Encouraged resumption of topiramate  to aid in reducing soft drink consumption. - Refilled bupropion  prescription. - Advised on reducing soft drink intake, highlighting the caloric impact of regular consumption. - Encouraged adherence to dietary plan and regular exercise.  Type 2 Diabetes Mellitus with hyperglycemia, without long-term current use of insulin  HgbA1c is at goal. Last A1c was 5.7 CBGs: Not checking Episodes of hypoglycemia: no Medication(s): Ozempic  2 mg SQ weekly  Denies mass in neck, dysphagia, dyspepsia, persistent hoarseness, abdominal pain, or N/V/Constipation or diarrhea. Has annual eye exam. Mood is stable.   Lab Results  Component Value Date   HGBA1C 5.7 (H) 07/22/2024  HGBA1C 5.8 (H) 05/15/2024   HGBA1C 5.7 (H) 12/06/2023   Lab Results  Component Value Date   LDLCALC 95 07/22/2024   CREATININE 0.86 08/06/2024   No results found for: GFR  Plan: Continue Ozempic  2 mg SQ weekly- no refill needed today She is working  on nutrition plan to decrease simple carbohydrates, increase lean proteins and exercise to promote weight  loss and improve glycemic control . Work on decreasing/discontinuing sugar sweetened drinks  Vitamin D  deficiency Managed with ergocalciferol  50,000 units weekly. No reported side effects from vitamin D  supplementation.Reports no N/V or muscle weakness with Ergocalciferol  . Goal of 50-70. Last vitamin D  Lab Results  Component Value Date   VD25OH 33.6 07/22/2024   Low vitamin D  levels can be associated with adiposity and may result in leptin resistance and weight gain. Also associated with fatigue.  Currently on vitamin D  supplementation without any adverse effects such as nausea, vomiting or muscle weakness.  - Continue ergocalciferol  50,000 units weekly. Reports no refill needed today.    Eating disorder/emotional eating [name] [name] has had issues with stress/emotional eating. Currently this is poorly controlled. Overall mood is stable. Medication(s): Topiramate  but has not been taking as still consuming Dr. Nunzio and does not like the way it tastes now We discussed that this is a beneficial side effect to try and encourage her to completely stop drinking regular sodas.  We reviewed the calories in 2 cans of Dr. Nunzio and then the amount of weight she could potentially loose by discontinuing the Dr. Nunzio completely. Also on Wellbutrin  and feels is helpful for EEB.  Plan: Continue Topiramate  50 mg daily and monitor response Continue Wellbutrin  SR 150 mg daily- Refilled Continue to work on emotional eating behaviors Continue to work on decreasing and discontinuing sugar sweetened beverage intake- Dr. Nunzio.  Meds ordered this encounter  Medications   buPROPion  (WELLBUTRIN  SR) 150 MG 12 hr tablet    Sig: Take 1 tablet (150 mg total) by mouth daily.    Dispense:  30 tablet    Refill:  0    Vitals Temp: 98.1 F (36.7 C) BP: 118/84 Pulse Rate: 74 SpO2: 97 %   Anthropometric Measurements Height: 5' 2 (1.575 m) Weight: 248 lb (112.5 kg) BMI (Calculated): 45.35 Weight at Last  Visit: 247lb Weight Lost Since Last Visit: 0lb Weight Gained Since Last Visit: 1lb Starting Weight: 267lb Total Weight Loss (lbs): 19 lb (8.618 kg) Peak Weight: 275lb   Body Composition  Body Fat %: 48.9 % Fat Mass (lbs): 121.6 lbs Muscle Mass (lbs): 120.8 lbs Total Body Water (lbs): 81.4 lbs Visceral Fat Rating : 16   Other Clinical Data Fasting: No Labs: No Today's Visit #: 26 Starting Date: 03/16/22     ASSESSMENT AND PLAN:  Diet: [name] [name] is currently in the action stage of change. As such, her goal is to get back to weight loss efforts. She has agreed to Category 3 Plan.  Exercise: [name] [name] has been instructed to work up to a goal of 150 minutes of combined cardio and strengthening exercise per week for weight loss and overall health benefits.   Behavior Modification:  We discussed the following Behavioral Modification Strategies today: increasing lean protein intake, decreasing simple carbohydrates, increasing vegetables, increase H2O intake, increase high fiber foods, meal planning and cooking strategies, emotional eating strategies , celebration eating strategies, avoiding temptations, and planning for success. We discussed various medication options to help [name] [name] with her weight loss efforts and we both agreed to continue  Ozempic  2 mg weekly for T2DM and topiramate  and wellbutrin  for emotional eating behaviors.  Return in about 4 weeks (around 10/22/2024).[name] [name] She was informed of the importance of frequent follow up visits to maximize her success with intensive lifestyle modifications for her multiple health conditions.  Attestation Statements:   Reviewed by clinician on day of visit: allergies, medications, problem list, medical history, surgical history, family history, social history, and previous encounter notes.   Time spent on visit including pre-visit chart review and post-visit care and charting was 28 minutes.    Tevion Laforge, PA-C  "

## 2024-09-24 ENCOUNTER — Ambulatory Visit (INDEPENDENT_AMBULATORY_CARE_PROVIDER_SITE_OTHER): Admitting: Physician Assistant

## 2024-09-24 ENCOUNTER — Encounter (INDEPENDENT_AMBULATORY_CARE_PROVIDER_SITE_OTHER): Payer: Self-pay | Admitting: Physician Assistant

## 2024-09-24 VITALS — BP 118/84 | HR 74 | Temp 98.1°F | Ht 62.0 in | Wt 248.0 lb

## 2024-09-24 DIAGNOSIS — E785 Hyperlipidemia, unspecified: Secondary | ICD-10-CM | POA: Diagnosis not present

## 2024-09-24 DIAGNOSIS — E669 Obesity, unspecified: Secondary | ICD-10-CM | POA: Diagnosis not present

## 2024-09-24 DIAGNOSIS — Z6841 Body Mass Index (BMI) 40.0 and over, adult: Secondary | ICD-10-CM | POA: Diagnosis not present

## 2024-09-24 DIAGNOSIS — E1165 Type 2 diabetes mellitus with hyperglycemia: Secondary | ICD-10-CM

## 2024-09-24 DIAGNOSIS — E559 Vitamin D deficiency, unspecified: Secondary | ICD-10-CM | POA: Diagnosis not present

## 2024-09-24 DIAGNOSIS — Z7985 Long-term (current) use of injectable non-insulin antidiabetic drugs: Secondary | ICD-10-CM

## 2024-09-24 DIAGNOSIS — E1169 Type 2 diabetes mellitus with other specified complication: Secondary | ICD-10-CM

## 2024-09-24 DIAGNOSIS — F5089 Other specified eating disorder: Secondary | ICD-10-CM | POA: Diagnosis not present

## 2024-09-24 DIAGNOSIS — F3289 Other specified depressive episodes: Secondary | ICD-10-CM

## 2024-09-24 MED ORDER — BUPROPION HCL ER (SR) 150 MG PO TB12: 150.0000 mg | ORAL_TABLET | Freq: Every day | ORAL | 0 refills | Status: AC

## 2024-10-07 ENCOUNTER — Emergency Department (HOSPITAL_COMMUNITY)
Admission: EM | Admit: 2024-10-07 | Discharge: 2024-10-08 | Disposition: A | Discharge status: Home / Self Care | Attending: Emergency Medicine | Admitting: Emergency Medicine

## 2024-10-07 ENCOUNTER — Other Ambulatory Visit: Payer: Self-pay

## 2024-10-07 ENCOUNTER — Encounter (HOSPITAL_COMMUNITY): Payer: Self-pay

## 2024-10-07 DIAGNOSIS — R1011 Right upper quadrant pain: Secondary | ICD-10-CM | POA: Insufficient documentation

## 2024-10-07 DIAGNOSIS — K760 Fatty (change of) liver, not elsewhere classified: Secondary | ICD-10-CM | POA: Insufficient documentation

## 2024-10-07 DIAGNOSIS — R7989 Other specified abnormal findings of blood chemistry: Secondary | ICD-10-CM | POA: Diagnosis not present

## 2024-10-07 DIAGNOSIS — I10 Essential (primary) hypertension: Secondary | ICD-10-CM | POA: Insufficient documentation

## 2024-10-07 DIAGNOSIS — E119 Type 2 diabetes mellitus without complications: Secondary | ICD-10-CM | POA: Diagnosis not present

## 2024-10-07 DIAGNOSIS — R1010 Upper abdominal pain, unspecified: Secondary | ICD-10-CM

## 2024-10-07 DIAGNOSIS — R1013 Epigastric pain: Secondary | ICD-10-CM | POA: Diagnosis not present

## 2024-10-07 DIAGNOSIS — R112 Nausea with vomiting, unspecified: Secondary | ICD-10-CM | POA: Insufficient documentation

## 2024-10-07 LAB — URINALYSIS, ROUTINE W REFLEX MICROSCOPIC
Bilirubin Urine: NEGATIVE
Glucose, UA: NEGATIVE mg/dL
Hgb urine dipstick: NEGATIVE
Ketones, ur: NEGATIVE mg/dL
Leukocytes,Ua: NEGATIVE
Nitrite: NEGATIVE
Protein, ur: NEGATIVE mg/dL
Specific Gravity, Urine: 1.024 (ref 1.005–1.030)
pH: 7 (ref 5.0–8.0)

## 2024-10-07 MED ORDER — ONDANSETRON 4 MG PO TBDP
4.0000 mg | ORAL_TABLET | Freq: Once | ORAL | Status: AC
  Administered 2024-10-07: 4 mg via ORAL
  Filled 2024-10-07: qty 1

## 2024-10-07 MED ORDER — OXYCODONE-ACETAMINOPHEN 5-325 MG PO TABS
1.0000 | ORAL_TABLET | Freq: Once | ORAL | Status: AC
  Administered 2024-10-07: 1 via ORAL
  Filled 2024-10-07: qty 1

## 2024-10-07 NOTE — ED Triage Notes (Addendum)
 Pt to ED c/o emesis, fever , chills x 1 day.  Reports epigastric pain 8/10 . Reports known sick contacts

## 2024-10-07 NOTE — ED Provider Triage Note (Cosign Needed Addendum)
 Emergency Medicine Provider Triage Evaluation Note  [name] [name] , a 54 y.o. female  was evaluated in triage.  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 Unable to keep anything down. Mild epigastric abdominal discomfort, headache. Mild constipation.  Prior abdominal surgeries- cholecystectomy, c-section  Review of Systems  Positive: Epigastric pain, n/v Negative: CP, SHOB  Physical Exam  BP (!) 151/87 (BP Location: Right Arm)   Pulse 91   Temp 97.9 F (36.6 C)   Resp 18   LMP  (LMP Unknown) Comment: implant removed no periods yet  SpO2 96%  Gen:   Awake, no distress   Resp:  Normal effort  MSK:   Moves extremities without difficulty  Other:  RUQ notably TTP  Medical Decision Making  Medically screening exam initiated at 11:05 PM.  Appropriate orders placed.  Desire E Littlefield was informed that the remainder of the evaluation will be completed by another provider, this initial triage assessment does not replace that evaluation, and the importance of remaining in the ED until their evaluation is complete.  0315am Patient reassessed, pain still present but improved. No longer vomiting. Reviewed labs. Upper abdomen TTP. Will add CT a/p to further evaluate.    Beverley Leita LABOR, PA-C 10/07/24 [id number]    Beverley Leita LABOR, PA-C 10/07/24 2308    Beverley Leita LABOR, PA-C 10/08/24 [phone]

## 2024-10-08 ENCOUNTER — Emergency Department (HOSPITAL_COMMUNITY)

## 2024-10-08 LAB — CBC WITH DIFFERENTIAL/PLATELET
Abs Immature Granulocytes: 0.05 K/uL (ref 0.00–0.07)
Basophils Absolute: 0.1 K/uL (ref 0.0–0.1)
Basophils Relative: 1 %
Eosinophils Absolute: 0.4 K/uL (ref 0.0–0.5)
Eosinophils Relative: 4 %
HCT: 39.4 % (ref 36.0–46.0)
Hemoglobin: 12.3 g/dL (ref 12.0–15.0)
Immature Granulocytes: 1 %
Lymphocytes Relative: 27 %
Lymphs Abs: 2.6 K/uL (ref 0.7–4.0)
MCH: 26.8 pg (ref 26.0–34.0)
MCHC: 31.2 g/dL (ref 30.0–36.0)
MCV: 85.8 fL (ref 80.0–100.0)
Monocytes Absolute: 0.8 K/uL (ref 0.1–1.0)
Monocytes Relative: 9 %
Neutro Abs: 5.5 K/uL (ref 1.7–7.7)
Neutrophils Relative %: 58 %
Platelets: 432 K/uL — ABNORMAL HIGH (ref 150–400)
RBC: 4.59 MIL/uL (ref 3.87–5.11)
RDW: 15.1 % (ref 11.5–15.5)
WBC: 9.3 K/uL (ref 4.0–10.5)
nRBC: 0 % (ref 0.0–0.2)

## 2024-10-08 LAB — COMPREHENSIVE METABOLIC PANEL WITH GFR
ALT: 8 U/L (ref 0–44)
AST: 17 U/L (ref 15–41)
Albumin: 3.8 g/dL (ref 3.5–5.0)
Alkaline Phosphatase: 77 U/L (ref 38–126)
Anion gap: 11 (ref 5–15)
BUN: 10 mg/dL (ref 6–20)
CO2: 24 mmol/L (ref 22–32)
Calcium: 9.3 mg/dL (ref 8.9–10.3)
Chloride: 106 mmol/L (ref 98–111)
Creatinine, Ser: 0.77 mg/dL (ref 0.44–1.00)
GFR, Estimated: >60 mL/min
Glucose, Bld: 100 mg/dL — ABNORMAL HIGH (ref 70–99)
Potassium: 4.1 mmol/L (ref 3.5–5.1)
Sodium: 141 mmol/L (ref 135–145)
Total Bilirubin: 0.5 mg/dL (ref 0.0–1.2)
Total Protein: 7.5 g/dL (ref 6.5–8.1)

## 2024-10-08 LAB — RESP PANEL BY RT-PCR (RSV, FLU A&B, COVID)  RVPGX2
Influenza A by PCR: NEGATIVE
Influenza B by PCR: NEGATIVE
Resp Syncytial Virus by PCR: NEGATIVE
SARS Coronavirus 2 by RT PCR: NEGATIVE

## 2024-10-08 LAB — TROPONIN T, HIGH SENSITIVITY
Troponin T High Sensitivity: <15 ng/L (ref 0–19)
Troponin T High Sensitivity: <15 ng/L (ref 0–19)

## 2024-10-08 LAB — LIPASE, BLOOD: Lipase: 62 U/L — ABNORMAL HIGH (ref 11–51)

## 2024-10-08 MED ORDER — ONDANSETRON 4 MG PO TBDP: 4.0000 mg | ORAL_TABLET | Freq: Three times a day (TID) | ORAL | 0 refills | Status: AC | PRN

## 2024-10-08 MED ORDER — OMEPRAZOLE 20 MG PO CPDR: 20.0000 mg | DELAYED_RELEASE_CAPSULE | Freq: Every day | ORAL | 0 refills | Status: AC

## 2024-10-08 MED ORDER — IOHEXOL 350 MG/ML SOLN
75.0000 mL | Freq: Once | INTRAVENOUS | Status: AC | PRN
  Administered 2024-10-08: 75 mL via INTRAVENOUS

## 2024-10-08 NOTE — Discharge Instructions (Addendum)
 Home to rest and hydrate. Take Zofran  as needed as prescribed for nausea and vomiting. Take Prilosec daily.  Recheck with your primary care provider.  If you do not have a primary care provider, please contact [facility] about this. Return to the emergency room for worsening or concerning symptoms.

## 2024-10-08 NOTE — ED Provider Notes (Signed)
 " [place] [facility] AT [facility] Provider Note   CSN: [id number] Arrival date & time: 10/07/24  2249     Patient presents with: Emesis   [name] [name] is a 54 y.o. female.   54 y.o. female  was evaluated in triage.  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 Unable to keep anything down. Mild epigastric abdominal discomfort, headache. Mild constipation.  Prior abdominal surgeries- cholecystectomy, c-section         Prior to Admission medications  Medication Sig Start Date End Date Taking? Authorizing Provider  omeprazole  (PRILOSEC) 20 MG capsule Take 1 capsule (20 mg total) by mouth daily. 10/08/24  Yes Beverley Leita LABOR, PA-C  ondansetron  (ZOFRAN -ODT) 4 MG disintegrating tablet Take 1 tablet (4 mg total) by mouth every 8 (eight) hours as needed for nausea or vomiting. 10/08/24  Yes Beverley Leita LABOR, PA-C  albuterol  (VENTOLIN  HFA) 108 (90 Base) MCG/ACT inhaler Inhale 2 puffs into the lungs every 6 (six) hours as needed for wheezing or shortness of breath. 02/21/23   [place] Adelita PENNER, MD  benzonatate  (TESSALON  PERLES) 100 MG capsule Take 1 capsule (100 mg total) by mouth every 6 (six) hours as needed for cough. 07/06/23   Jolaine Pac, DO  buPROPion  (WELLBUTRIN  SR) 150 MG 12 hr tablet Take 1 tablet (150 mg total) by mouth daily. 09/24/24   Rayburn, Elouise Phlegm, PA-C  cetirizine  (ZYRTEC  ALLERGY) 10 MG tablet Take 1 tablet (10 mg total) by mouth daily. 07/06/23 08/20/24  Jolaine Pac, DO  Cholecalciferol  20 MCG (800 UNIT) TABS Take 20 mcg by mouth daily. 11/01/23   Masters, Katie, DO  gabapentin  (NEURONTIN ) 300 MG capsule Take 1 capsule (300 mg total) by mouth at bedtime. 09/04/22 07/02/25  Elnora Ip, MD  ondansetron  (ZOFRAN ) 4 MG tablet Take 1 tablet (4 mg total) by mouth every 6 (six) hours. 08/06/24   Bauer, Collin S, PA-C  rosuvastatin  (CRESTOR ) 20 MG tablet Take 1 tablet (20 mg total) by mouth daily. 10/22/23    Gabino Boga, MD  Semaglutide , 2 MG/DOSE, (OZEMPIC , 2 MG/DOSE,) 8 MG/3ML SOPN Inject 2 mg into the skin once a week. 07/22/24   Verdon Parry D, MD  topiramate  (TOPAMAX ) 50 MG tablet Take 1 tablet (50 mg total) by mouth daily. 07/22/24   Verdon Parry D, MD  Vitamin D , Ergocalciferol , (DRISDOL ) 1.25 MG (50000 UNIT) CAPS capsule Take 1 capsule (50,000 Units total) by mouth every 7 (seven) days. 07/22/24   Verdon Parry D, MD    Allergies: Cat dander, Clorox nasal antiseptic [povidone-iodine ], and Mushroom extract complex (obsolete)    Review of Systems Negative except as per HPI Updated Vital Signs BP 118/78   Pulse 80   Temp 97.9 F (36.6 C)   Resp 16   Ht 5' 2 (1.575 m)   Wt 112.5 kg   LMP  (LMP Unknown) Comment: implant removed no periods yet  SpO2 95%   BMI 45.36 kg/m   Physical Exam Vitals and nursing note reviewed.  Constitutional:      General: She is not in acute distress.    Appearance: She is well-developed. She is not diaphoretic.  HENT:     Head: Normocephalic and atraumatic.  Cardiovascular:     Rate and Rhythm: Normal rate and regular rhythm.     Heart sounds: Normal heart sounds.  Pulmonary:     Effort: Pulmonary effort is normal.     Breath sounds: Normal breath sounds.  Abdominal:  Palpations: Abdomen is soft.     Tenderness: There is abdominal tenderness in the right upper quadrant and epigastric area. There is no guarding or rebound.  Skin:    General: Skin is warm and dry.     Findings: No erythema or rash.  Neurological:     Mental Status: She is alert and oriented to person, place, and time.  Psychiatric:        Behavior: Behavior normal.     (all labs ordered are listed, but only abnormal results are displayed) Labs Reviewed  CBC WITH DIFFERENTIAL/PLATELET - Abnormal; Notable for the following components:      Result Value   Platelets 432 (*)    All other components within normal limits  COMPREHENSIVE METABOLIC PANEL WITH GFR -  Abnormal; Notable for the following components:   Glucose, Bld 100 (*)    All other components within normal limits  LIPASE, BLOOD - Abnormal; Notable for the following components:   Lipase 62 (*)    All other components within normal limits  URINALYSIS, ROUTINE W REFLEX MICROSCOPIC - Abnormal; Notable for the following components:   APPearance CLOUDY (*)    All other components within normal limits  RESP PANEL BY RT-PCR (RSV, FLU A&B, COVID)  [id number]  TROPONIN T, HIGH SENSITIVITY  TROPONIN T, HIGH SENSITIVITY    EKG: None  Radiology: CT ABDOMEN PELVIS W CONTRAST Result Date: 10/08/2024 EXAM: CT ABDOMEN AND PELVIS WITH CONTRAST 10/08/2024 03:43:09 AM TECHNIQUE: CT of the abdomen and pelvis was performed with the administration of 75 mL of iohexol  (OMNIPAQUE ) 350 MG/ML injection. Multiplanar reformatted images are provided for review. Automated exposure control, iterative reconstruction, and/or weight-based adjustment of the mA/kV was utilized to reduce the radiation dose to as low as reasonably achievable. COMPARISON: Comparison study 08/06/2024, ultrasound from earlier in the same day. CLINICAL HISTORY: Abdominal pain, acute, nonlocalized. The patient presents with acute, nonlocalized abdominal pain. ICD10: R10.0 - Acute abdomen. FINDINGS: LOWER CHEST: No acute abnormality. LIVER: The liver is unremarkable. GALLBLADDER AND BILE DUCTS: The gallbladder has been surgically removed. No biliary ductal dilatation. SPLEEN: No acute abnormality. PANCREAS: No acute abnormality. ADRENAL GLANDS: No acute abnormality. KIDNEYS, URETERS AND BLADDER: No renal calculi or obstructive changes are seen. No perinephric or periureteral stranding. The bladder is decompressed. GI AND BOWEL: The small bowel and stomach are unremarkable. The appendix is within normal limits. There is no bowel obstruction. PERITONEUM AND RETROPERITONEUM: No ascites. No free air. VASCULATURE: Aorta is normal in caliber. LYMPH NODES: No  lymphadenopathy. REPRODUCTIVE ORGANS: The uterus is within normal limits. BONES AND SOFT TISSUES: No acute osseous abnormality. No focal soft tissue abnormality. IMPRESSION: 1. No acute findings in the abdomen or pelvis. Electronically signed by: Oneil Devonshire MD 10/08/2024 03:49 AM EST RP Workstation: [id number]   US  Abdomen Limited Result Date: 10/08/2024 EXAM: Right Upper Quadrant Abdominal Ultrasound 10/08/2024 12:36:00 AM TECHNIQUE: Real-time ultrasonography of the right upper quadrant of the abdomen was performed. COMPARISON: None available. CLINICAL HISTORY: RUQ pain, vomiting. The patient presents with right upper quadrant pain and vomiting. FINDINGS: LIVER: Increased hepatic parenchyma. Normal echogenicity. No intrahepatic biliary ductal dilatation. No evidence of mass. Hepatopetal flow in the portal vein. BILIARY SYSTEM: Gallbladder is surgically absent. Common bile duct is within normal limits measuring 2 mm. RIGHT KIDNEY: No hydronephrosis. No echogenic calculi. No mass. OTHER: No right upper quadrant ascites. IMPRESSION: 1. No acute findings. 2. Hepatic steatosis. 3. Surgically absent gallbladder. Electronically signed by: Morgane Naveau MD 10/08/2024 12:41 AM EST  RP Workstation: [id number]     Procedures   Medications Ordered in the ED  ondansetron  (ZOFRAN -ODT) disintegrating tablet 4 mg (4 mg Oral Given 10/07/24 2335)  oxyCODONE -acetaminophen  (PERCOCET/ROXICET) 5-325 MG per tablet 1 tablet (1 tablet Oral Given 10/07/24 2335)  iohexol  (OMNIPAQUE ) 350 MG/ML injection 75 mL (75 mLs Intravenous Contrast Given 10/08/24 0343)                                    Medical Decision Making Amount and/or Complexity of Data Reviewed Labs: ordered. Radiology: ordered.  Risk Prescription drug management.   This patient presents to the ED for concern of abdominal pain, nausea, vomiting, this involves an extensive number of treatment options, and is a complaint that carries with it a high risk of  complications and morbidity.  The differential diagnosis includes but not limited to pancreatitis, gastritis, colitis, viral illness, metabolic or electrolyte derangement, ACS   Co morbidities / Chronic conditions that complicate the patient evaluation  OSA, iron deficiency anemia, diabetes, hyperlipidemia, hypertension.  Status post cholecystectomy (2013)   Additional history obtained:  Additional history obtained from EMR External records from outside source obtained and reviewed including prior labs and imaging on file   Lab Tests:  I Ordered, and personally interpreted labs.  The pertinent results include: Urinalysis is cloudy without evidence of infection.  CBC with slightly elevated platelets otherwise normal WBC.  Lipase minimally elevated at 62.  CMP without significant findings.  Troponins are negative.  Negative for COVID, flu, RSV.   Imaging Studies ordered:  I ordered imaging studies including ultrasound right upper quadrant, CT abdomen pelvis I independently visualized and interpreted imaging which showed no acute findings I agree with the radiologist interpretation   Cardiac Monitoring: / EKG:  The patient was maintained on a cardiac monitor.  I personally viewed and interpreted the cardiac monitored which showed an underlying rhythm of: Normal sinus rhythm, rate 89   Problem List / ED Course / Critical interventions / Medication management  54 year old female presents emergency room with upper abdominal pain, nausea, vomiting.  She is found to have upper abdominal tenderness on exam.  Provided with Zofran  and Percocet.  On recheck, symptoms are improving although still persistent.  Labs generally reassuring.  Add on CT abdomen pelvis does not show any acute findings to explain patient's symptoms.  Consider gastritis versus viral illness.  Will start on omeprazole , provided with prescription for Zofran .  Recommend recheck with PCP.  Return to ER for worsening or  concerning symptoms. I ordered medication including zofran , percocet   Reevaluation of the patient after these medicines showed that the patient pain improved, slight nausea but able to tolerate sips  I have reviewed the patients home medicines and have made adjustments as needed   Social Determinants of Health:  Lives with family   Test / Admission - Considered:  Stable for dc      Final diagnoses:  Pain of upper abdomen  Nausea and vomiting, unspecified vomiting type  Hepatic steatosis    ED Discharge Orders          Ordered    ondansetron  (ZOFRAN -ODT) 4 MG disintegrating tablet  Every 8 hours PRN        10/08/24 0457    omeprazole  (PRILOSEC) 20 MG capsule  Daily        10/08/24 0457  Beverley Leita LABOR, PA-C 10/08/24 0525    Griselda Norris, MD 10/08/24 [phone]  "

## 2024-10-16 ENCOUNTER — Other Ambulatory Visit

## 2024-10-21 ENCOUNTER — Encounter: Payer: Self-pay | Admitting: Student

## 2024-10-24 ENCOUNTER — Telehealth (INDEPENDENT_AMBULATORY_CARE_PROVIDER_SITE_OTHER): Payer: Self-pay

## 2024-10-24 NOTE — Telephone Encounter (Signed)
Message from Plan:  Prior Authorization Not Required

## 2024-10-30 ENCOUNTER — Telehealth: Payer: Self-pay | Admitting: *Deleted

## 2024-10-30 NOTE — Telephone Encounter (Signed)
 [facility] appt. 10-16-2024 canceled- breast center x 2 attempts to contact patient. Patient to callback to R/S this appointment.

## 2024-11-05 ENCOUNTER — Ambulatory Visit (INDEPENDENT_AMBULATORY_CARE_PROVIDER_SITE_OTHER): Admitting: Family Medicine

## 2024-11-13 ENCOUNTER — Telehealth: Admitting: Neurology
